# Patient Record
Sex: Female | Born: 1980 | Race: White | Hispanic: No | Smoking: Current every day smoker
Health system: Southern US, Community
[De-identification: ages and names within clinical notes are randomized; demographics above are authoritative.]

## PROBLEM LIST (undated history)

## (undated) DIAGNOSIS — F1990 Other psychoactive substance use, unspecified, uncomplicated: Secondary | ICD-10-CM

## (undated) DIAGNOSIS — J45909 Unspecified asthma, uncomplicated: Secondary | ICD-10-CM

## (undated) DIAGNOSIS — B1920 Unspecified viral hepatitis C without hepatic coma: Secondary | ICD-10-CM

---

## 2016-07-06 DIAGNOSIS — E876 Hypokalemia: Secondary | ICD-10-CM | POA: Diagnosis present

## 2016-07-06 DIAGNOSIS — I2690 Septic pulmonary embolism without acute cor pulmonale: Secondary | ICD-10-CM | POA: Diagnosis present

## 2016-07-06 DIAGNOSIS — B1920 Unspecified viral hepatitis C without hepatic coma: Secondary | ICD-10-CM | POA: Diagnosis present

## 2016-07-06 DIAGNOSIS — J90 Pleural effusion, not elsewhere classified: Secondary | ICD-10-CM | POA: Diagnosis present

## 2016-07-06 DIAGNOSIS — A4102 Sepsis due to Methicillin resistant Staphylococcus aureus: Principal | ICD-10-CM | POA: Diagnosis present

## 2016-07-06 DIAGNOSIS — K083 Retained dental root: Secondary | ICD-10-CM | POA: Diagnosis present

## 2016-07-06 DIAGNOSIS — J45909 Unspecified asthma, uncomplicated: Secondary | ICD-10-CM | POA: Diagnosis present

## 2016-07-06 DIAGNOSIS — M009 Pyogenic arthritis, unspecified: Secondary | ICD-10-CM | POA: Diagnosis present

## 2016-07-06 DIAGNOSIS — D509 Iron deficiency anemia, unspecified: Secondary | ICD-10-CM | POA: Diagnosis not present

## 2016-07-06 DIAGNOSIS — F17210 Nicotine dependence, cigarettes, uncomplicated: Secondary | ICD-10-CM | POA: Diagnosis present

## 2016-07-06 DIAGNOSIS — I330 Acute and subacute infective endocarditis: Secondary | ICD-10-CM | POA: Diagnosis present

## 2016-07-06 DIAGNOSIS — D696 Thrombocytopenia, unspecified: Secondary | ICD-10-CM | POA: Diagnosis present

## 2016-07-06 DIAGNOSIS — J189 Pneumonia, unspecified organism: Secondary | ICD-10-CM | POA: Diagnosis present

## 2016-07-06 DIAGNOSIS — F1110 Opioid abuse, uncomplicated: Secondary | ICD-10-CM | POA: Diagnosis present

## 2016-07-06 DIAGNOSIS — F1010 Alcohol abuse, uncomplicated: Secondary | ICD-10-CM | POA: Diagnosis present

## 2016-07-06 DIAGNOSIS — L89152 Pressure ulcer of sacral region, stage 2: Secondary | ICD-10-CM | POA: Diagnosis not present

## 2016-07-06 DIAGNOSIS — R21 Rash and other nonspecific skin eruption: Secondary | ICD-10-CM | POA: Diagnosis not present

## 2016-07-06 DIAGNOSIS — K029 Dental caries, unspecified: Secondary | ICD-10-CM | POA: Diagnosis present

## 2016-07-06 DIAGNOSIS — K047 Periapical abscess without sinus: Secondary | ICD-10-CM | POA: Diagnosis present

## 2016-07-06 DIAGNOSIS — K045 Chronic apical periodontitis: Secondary | ICD-10-CM | POA: Diagnosis present

## 2016-07-06 DIAGNOSIS — F1410 Cocaine abuse, uncomplicated: Secondary | ICD-10-CM | POA: Diagnosis present

## 2016-07-07 ENCOUNTER — Inpatient Hospital Stay (HOSPITAL_BASED_OUTPATIENT_CLINIC_OR_DEPARTMENT_OTHER): Payer: Self-pay

## 2016-07-07 ENCOUNTER — Emergency Department (HOSPITAL_BASED_OUTPATIENT_CLINIC_OR_DEPARTMENT_OTHER): Payer: Self-pay

## 2016-07-07 ENCOUNTER — Encounter (HOSPITAL_BASED_OUTPATIENT_CLINIC_OR_DEPARTMENT_OTHER): Payer: Self-pay | Admitting: *Deleted

## 2016-07-07 ENCOUNTER — Inpatient Hospital Stay (HOSPITAL_BASED_OUTPATIENT_CLINIC_OR_DEPARTMENT_OTHER)
Admission: EM | Admit: 2016-07-07 | Discharge: 2016-08-08 | DRG: 853 | Disposition: A | Discharge status: Skilled Nursing Facility | Payer: Self-pay | Attending: Nephrology | Admitting: Nephrology

## 2016-07-07 DIAGNOSIS — R768 Other specified abnormal immunological findings in serum: Secondary | ICD-10-CM | POA: Diagnosis present

## 2016-07-07 DIAGNOSIS — K083 Retained dental root: Secondary | ICD-10-CM | POA: Diagnosis present

## 2016-07-07 DIAGNOSIS — J90 Pleural effusion, not elsewhere classified: Secondary | ICD-10-CM

## 2016-07-07 DIAGNOSIS — M25511 Pain in right shoulder: Secondary | ICD-10-CM

## 2016-07-07 DIAGNOSIS — R52 Pain, unspecified: Secondary | ICD-10-CM

## 2016-07-07 DIAGNOSIS — B9562 Methicillin resistant Staphylococcus aureus infection as the cause of diseases classified elsewhere: Secondary | ICD-10-CM | POA: Diagnosis present

## 2016-07-07 DIAGNOSIS — K047 Periapical abscess without sinus: Secondary | ICD-10-CM

## 2016-07-07 DIAGNOSIS — A419 Sepsis, unspecified organism: Secondary | ICD-10-CM | POA: Diagnosis present

## 2016-07-07 DIAGNOSIS — F1010 Alcohol abuse, uncomplicated: Secondary | ICD-10-CM | POA: Diagnosis present

## 2016-07-07 DIAGNOSIS — E876 Hypokalemia: Secondary | ICD-10-CM

## 2016-07-07 DIAGNOSIS — M009 Pyogenic arthritis, unspecified: Secondary | ICD-10-CM

## 2016-07-07 DIAGNOSIS — F1910 Other psychoactive substance abuse, uncomplicated: Secondary | ICD-10-CM

## 2016-07-07 DIAGNOSIS — R6510 Systemic inflammatory response syndrome (SIRS) of non-infectious origin without acute organ dysfunction: Secondary | ICD-10-CM

## 2016-07-07 DIAGNOSIS — R7881 Bacteremia: Secondary | ICD-10-CM

## 2016-07-07 DIAGNOSIS — T17908A Unspecified foreign body in respiratory tract, part unspecified causing other injury, initial encounter: Secondary | ICD-10-CM

## 2016-07-07 DIAGNOSIS — M549 Dorsalgia, unspecified: Secondary | ICD-10-CM

## 2016-07-07 DIAGNOSIS — J984 Other disorders of lung: Secondary | ICD-10-CM

## 2016-07-07 DIAGNOSIS — D696 Thrombocytopenia, unspecified: Secondary | ICD-10-CM | POA: Diagnosis present

## 2016-07-07 DIAGNOSIS — R0602 Shortness of breath: Secondary | ICD-10-CM

## 2016-07-07 DIAGNOSIS — M7551 Bursitis of right shoulder: Secondary | ICD-10-CM

## 2016-07-07 DIAGNOSIS — Z8679 Personal history of other diseases of the circulatory system: Secondary | ICD-10-CM | POA: Diagnosis present

## 2016-07-07 DIAGNOSIS — F1990 Other psychoactive substance use, unspecified, uncomplicated: Secondary | ICD-10-CM | POA: Diagnosis present

## 2016-07-07 DIAGNOSIS — D649 Anemia, unspecified: Secondary | ICD-10-CM | POA: Diagnosis present

## 2016-07-07 DIAGNOSIS — R509 Fever, unspecified: Secondary | ICD-10-CM

## 2016-07-07 DIAGNOSIS — J189 Pneumonia, unspecified organism: Secondary | ICD-10-CM

## 2016-07-07 DIAGNOSIS — F17210 Nicotine dependence, cigarettes, uncomplicated: Secondary | ICD-10-CM | POA: Diagnosis present

## 2016-07-07 DIAGNOSIS — K029 Dental caries, unspecified: Secondary | ICD-10-CM | POA: Diagnosis present

## 2016-07-07 DIAGNOSIS — K045 Chronic apical periodontitis: Secondary | ICD-10-CM | POA: Diagnosis present

## 2016-07-07 DIAGNOSIS — K0530 Chronic periodontitis, unspecified: Secondary | ICD-10-CM | POA: Diagnosis present

## 2016-07-07 DIAGNOSIS — M25519 Pain in unspecified shoulder: Secondary | ICD-10-CM

## 2016-07-07 DIAGNOSIS — L8990 Pressure ulcer of unspecified site, unspecified stage: Secondary | ICD-10-CM | POA: Insufficient documentation

## 2016-07-07 DIAGNOSIS — I2690 Septic pulmonary embolism without acute cor pulmonale: Secondary | ICD-10-CM

## 2016-07-07 DIAGNOSIS — I76 Septic arterial embolism: Secondary | ICD-10-CM

## 2016-07-07 DIAGNOSIS — A4102 Sepsis due to Methicillin resistant Staphylococcus aureus: Secondary | ICD-10-CM

## 2016-07-07 HISTORY — DX: Unspecified viral hepatitis C without hepatic coma: B19.20

## 2016-07-07 HISTORY — DX: Unspecified asthma, uncomplicated: J45.909

## 2016-07-07 HISTORY — DX: Other psychoactive substance use, unspecified, uncomplicated: F19.90

## 2016-07-07 LAB — CBC WITH DIFFERENTIAL/PLATELET
BASOS ABS: 0 10*3/uL (ref 0.0–0.1)
Basophils Relative: 0 %
EOS ABS: 0 10*3/uL (ref 0.0–0.7)
Eosinophils Relative: 0 %
HEMATOCRIT: 36 % (ref 36.0–46.0)
HEMOGLOBIN: 12.1 g/dL (ref 12.0–15.0)
LYMPHS PCT: 6 %
Lymphs Abs: 1.2 10*3/uL (ref 0.7–4.0)
MCH: 27 pg (ref 26.0–34.0)
MCHC: 33.6 g/dL (ref 30.0–36.0)
MCV: 80.4 fL (ref 78.0–100.0)
Monocytes Absolute: 1.6 10*3/uL — ABNORMAL HIGH (ref 0.1–1.0)
Monocytes Relative: 8 %
NEUTROS PCT: 86 %
Neutro Abs: 16.8 10*3/uL — ABNORMAL HIGH (ref 1.7–7.7)
Platelets: 115 10*3/uL — ABNORMAL LOW (ref 150–400)
RBC: 4.48 MIL/uL (ref 3.87–5.11)
RDW: 16.6 % — ABNORMAL HIGH (ref 11.5–15.5)
WBC: 19.6 10*3/uL — ABNORMAL HIGH (ref 4.0–10.5)

## 2016-07-07 LAB — BLOOD CULTURE ID PANEL (REFLEXED)
Acinetobacter baumannii: NOT DETECTED
CANDIDA GLABRATA: NOT DETECTED
CANDIDA KRUSEI: NOT DETECTED
CANDIDA TROPICALIS: NOT DETECTED
Candida albicans: NOT DETECTED
Candida parapsilosis: NOT DETECTED
ENTEROBACTER CLOACAE COMPLEX: NOT DETECTED
ENTEROCOCCUS SPECIES: NOT DETECTED
ESCHERICHIA COLI: NOT DETECTED
Enterobacteriaceae species: NOT DETECTED
Haemophilus influenzae: NOT DETECTED
KLEBSIELLA PNEUMONIAE: NOT DETECTED
Klebsiella oxytoca: NOT DETECTED
LISTERIA MONOCYTOGENES: NOT DETECTED
Methicillin resistance: DETECTED — AB
Neisseria meningitidis: NOT DETECTED
PROTEUS SPECIES: NOT DETECTED
Pseudomonas aeruginosa: NOT DETECTED
SERRATIA MARCESCENS: NOT DETECTED
STAPHYLOCOCCUS SPECIES: DETECTED — AB
STREPTOCOCCUS AGALACTIAE: NOT DETECTED
Staphylococcus aureus (BCID): DETECTED — AB
Streptococcus pneumoniae: NOT DETECTED
Streptococcus pyogenes: NOT DETECTED
Streptococcus species: NOT DETECTED

## 2016-07-07 LAB — PREGNANCY, URINE: Preg Test, Ur: NEGATIVE

## 2016-07-07 LAB — COMPREHENSIVE METABOLIC PANEL
ALBUMIN: 1.6 g/dL — AB (ref 3.5–5.0)
ALK PHOS: 166 U/L — AB (ref 38–126)
ALT: 38 U/L (ref 14–54)
ALT: 23 U/L (ref 14–54)
ANION GAP: 13 (ref 5–15)
AST: 80 U/L — ABNORMAL HIGH (ref 15–41)
AST: 44 U/L — AB (ref 15–41)
Albumin: 2.5 g/dL — ABNORMAL LOW (ref 3.5–5.0)
Alkaline Phosphatase: 110 U/L (ref 38–126)
Anion gap: 5 (ref 5–15)
BILIRUBIN TOTAL: 0.8 mg/dL (ref 0.3–1.2)
BUN: 15 mg/dL (ref 6–20)
BUN: 11 mg/dL (ref 6–20)
CALCIUM: 8.3 mg/dL — AB (ref 8.9–10.3)
CO2: 27 mmol/L (ref 22–32)
CO2: 19 mmol/L — ABNORMAL LOW (ref 22–32)
CREATININE: 0.74 mg/dL (ref 0.44–1.00)
Calcium: 6.9 mg/dL — ABNORMAL LOW (ref 8.9–10.3)
Chloride: 88 mmol/L — ABNORMAL LOW (ref 101–111)
Chloride: 110 mmol/L (ref 101–111)
Creatinine, Ser: 1.1 mg/dL — ABNORMAL HIGH (ref 0.44–1.00)
GFR calc Af Amer: >60 mL/min (ref >60)
GFR calc non Af Amer: >60 mL/min (ref >60)
GLUCOSE: 105 mg/dL — AB (ref 65–99)
Glucose, Bld: 125 mg/dL — ABNORMAL HIGH (ref 65–99)
Potassium: 2.9 mmol/L — ABNORMAL LOW (ref 3.5–5.1)
Potassium: 3.2 mmol/L — ABNORMAL LOW (ref 3.5–5.1)
SODIUM: 128 mmol/L — AB (ref 135–145)
Sodium: 134 mmol/L — ABNORMAL LOW (ref 135–145)
TOTAL PROTEIN: 4.8 g/dL — AB (ref 6.5–8.1)
TOTAL PROTEIN: 7 g/dL (ref 6.5–8.1)
Total Bilirubin: 0.9 mg/dL (ref 0.3–1.2)

## 2016-07-07 LAB — CBC
HEMATOCRIT: 28.1 % — AB (ref 36.0–46.0)
Hemoglobin: 9.2 g/dL — ABNORMAL LOW (ref 12.0–15.0)
MCH: 26.4 pg (ref 26.0–34.0)
MCHC: 32.7 g/dL (ref 30.0–36.0)
MCV: 80.5 fL (ref 78.0–100.0)
Platelets: 111 10*3/uL — ABNORMAL LOW (ref 150–400)
RBC: 3.49 MIL/uL — AB (ref 3.87–5.11)
RDW: 16.8 % — ABNORMAL HIGH (ref 11.5–15.5)
WBC: 13.5 10*3/uL — AB (ref 4.0–10.5)

## 2016-07-07 LAB — URINALYSIS, ROUTINE W REFLEX MICROSCOPIC
GLUCOSE, UA: NEGATIVE mg/dL
Ketones, ur: NEGATIVE mg/dL
Nitrite: NEGATIVE
PH: 6 (ref 5.0–8.0)
PROTEIN: 100 mg/dL — AB
Specific Gravity, Urine: 1.019 (ref 1.005–1.030)

## 2016-07-07 LAB — RAPID URINE DRUG SCREEN, HOSP PERFORMED
AMPHETAMINES: NOT DETECTED
Barbiturates: NOT DETECTED
Benzodiazepines: NOT DETECTED
Cocaine: NOT DETECTED
OPIATES: POSITIVE — AB
Tetrahydrocannabinol: NOT DETECTED

## 2016-07-07 LAB — TROPONIN I
Troponin I: <0.03 ng/mL (ref <0.03)
Troponin I: 0.04 ng/mL (ref <0.03)

## 2016-07-07 LAB — CREATININE, SERUM
Creatinine, Ser: 0.84 mg/dL (ref 0.44–1.00)
GFR calc non Af Amer: >60 mL/min (ref >60)

## 2016-07-07 LAB — URINALYSIS, MICROSCOPIC (REFLEX)

## 2016-07-07 LAB — I-STAT CG4 LACTIC ACID, ED
LACTIC ACID, VENOUS: 3.72 mmol/L — AB (ref 0.5–1.9)
Lactic Acid, Venous: 0.76 mmol/L (ref 0.5–1.9)
Lactic Acid, Venous: 1.42 mmol/L (ref 0.5–1.9)

## 2016-07-07 LAB — LACTIC ACID, PLASMA
Lactic Acid, Venous: 3.3 mmol/L (ref 0.5–1.9)
Lactic Acid, Venous: 2.6 mmol/L (ref 0.5–1.9)

## 2016-07-07 LAB — PHOSPHORUS: Phosphorus: 1.2 mg/dL — ABNORMAL LOW (ref 2.5–4.6)

## 2016-07-07 LAB — RAPID HIV SCREEN (HIV 1/2 AB+AG)
HIV 1/2 Antibodies: NONREACTIVE
HIV-1 P24 Antigen - HIV24: NONREACTIVE

## 2016-07-07 LAB — MRSA PCR SCREENING: MRSA by PCR: POSITIVE — AB

## 2016-07-07 LAB — MAGNESIUM: MAGNESIUM: 1.9 mg/dL (ref 1.7–2.4)

## 2016-07-07 MED ORDER — VITAMIN B-1 100 MG PO TABS
100.0000 mg | ORAL_TABLET | Freq: Every day | ORAL | Status: DC
  Administered 2016-07-07 – 2016-08-08 (×31): 100 mg via ORAL
  Filled 2016-07-07 (×32): qty 1

## 2016-07-07 MED ORDER — IOPAMIDOL (ISOVUE-370) INJECTION 76%
100.0000 mL | Freq: Once | INTRAVENOUS | Status: AC | PRN
  Administered 2016-07-07: 100 mL via INTRAVENOUS

## 2016-07-07 MED ORDER — LORAZEPAM 2 MG/ML IJ SOLN
1.0000 mg | Freq: Four times a day (QID) | INTRAMUSCULAR | Status: AC | PRN
  Administered 2016-07-07 – 2016-07-10 (×6): 1 mg via INTRAVENOUS
  Filled 2016-07-07 (×6): qty 1

## 2016-07-07 MED ORDER — PIPERACILLIN-TAZOBACTAM 3.375 G IVPB 30 MIN
3.3750 g | Freq: Once | INTRAVENOUS | Status: AC
  Administered 2016-07-07: 3.375 g via INTRAVENOUS
  Filled 2016-07-07 (×2): qty 50

## 2016-07-07 MED ORDER — FENTANYL CITRATE (PF) 100 MCG/2ML IJ SOLN
25.0000 ug | Freq: Once | INTRAMUSCULAR | Status: AC
  Administered 2016-07-07: 25 ug via INTRAVENOUS
  Filled 2016-07-07: qty 2

## 2016-07-07 MED ORDER — ALBUTEROL SULFATE (2.5 MG/3ML) 0.083% IN NEBU
2.5000 mg | INHALATION_SOLUTION | RESPIRATORY_TRACT | Status: AC | PRN
  Administered 2016-07-07: 2.5 mg via RESPIRATORY_TRACT
  Filled 2016-07-07: qty 3

## 2016-07-07 MED ORDER — KETOROLAC TROMETHAMINE 15 MG/ML IJ SOLN
15.0000 mg | Freq: Four times a day (QID) | INTRAMUSCULAR | Status: DC | PRN
  Administered 2016-07-08 (×3): 15 mg via INTRAVENOUS
  Filled 2016-07-07 (×5): qty 1

## 2016-07-07 MED ORDER — ONDANSETRON HCL 4 MG/2ML IJ SOLN: 4.0000 mg | Freq: Three times a day (TID) | INTRAMUSCULAR | Status: AC | PRN

## 2016-07-07 MED ORDER — OXYCODONE HCL 5 MG PO TABS
5.0000 mg | ORAL_TABLET | ORAL | Status: DC | PRN
  Administered 2016-07-07 – 2016-07-26 (×54): 5 mg via ORAL
  Filled 2016-07-07 (×61): qty 1

## 2016-07-07 MED ORDER — SODIUM CHLORIDE 0.9 % IV BOLUS (SEPSIS)
500.0000 mL | Freq: Once | INTRAVENOUS | Status: AC
  Administered 2016-07-07: 500 mL via INTRAVENOUS

## 2016-07-07 MED ORDER — ONDANSETRON HCL 4 MG/2ML IJ SOLN
4.0000 mg | Freq: Four times a day (QID) | INTRAMUSCULAR | Status: DC | PRN
  Administered 2016-07-30 – 2016-08-01 (×2): 4 mg via INTRAVENOUS
  Filled 2016-07-07 (×2): qty 2

## 2016-07-07 MED ORDER — HYDRALAZINE HCL 20 MG/ML IJ SOLN: 10.0000 mg | Freq: Three times a day (TID) | INTRAMUSCULAR | Status: DC | PRN

## 2016-07-07 MED ORDER — SODIUM CHLORIDE 0.9 % IV SOLN
INTRAVENOUS | Status: AC
  Administered 2016-07-07: 04:00:00 via INTRAVENOUS

## 2016-07-07 MED ORDER — SODIUM CHLORIDE 0.9 % IV BOLUS (SEPSIS)
1000.0000 mL | Freq: Once | INTRAVENOUS | Status: AC
  Administered 2016-07-07: 1000 mL via INTRAVENOUS

## 2016-07-07 MED ORDER — POTASSIUM CHLORIDE 10 MEQ/100ML IV SOLN
10.0000 meq | INTRAVENOUS | Status: AC
  Administered 2016-07-07 (×4): 10 meq via INTRAVENOUS
  Filled 2016-07-07 (×4): qty 100

## 2016-07-07 MED ORDER — ENSURE ENLIVE PO LIQD
237.0000 mL | Freq: Two times a day (BID) | ORAL | Status: DC
  Administered 2016-07-07 – 2016-07-24 (×23): 237 mL via ORAL

## 2016-07-07 MED ORDER — MUPIROCIN 2 % EX OINT
1.0000 "application " | TOPICAL_OINTMENT | Freq: Two times a day (BID) | CUTANEOUS | Status: AC
  Administered 2016-07-07 – 2016-07-12 (×10): 1 via NASAL
  Filled 2016-07-07 (×3): qty 22

## 2016-07-07 MED ORDER — ACETAMINOPHEN 325 MG PO TABS
650.0000 mg | ORAL_TABLET | Freq: Four times a day (QID) | ORAL | Status: DC | PRN
  Administered 2016-07-07 – 2016-08-07 (×46): 650 mg via ORAL
  Filled 2016-07-07 (×49): qty 2

## 2016-07-07 MED ORDER — PIPERACILLIN-TAZOBACTAM 3.375 G IVPB
3.3750 g | Freq: Three times a day (TID) | INTRAVENOUS | Status: DC
  Filled 2016-07-07 (×2): qty 50

## 2016-07-07 MED ORDER — KETOROLAC TROMETHAMINE 15 MG/ML IJ SOLN
15.0000 mg | Freq: Once | INTRAMUSCULAR | Status: AC
  Administered 2016-07-07: 15 mg via INTRAVENOUS
  Filled 2016-07-07: qty 1

## 2016-07-07 MED ORDER — POLYETHYLENE GLYCOL 3350 17 G PO PACK
17.0000 g | PACK | Freq: Every day | ORAL | Status: DC | PRN
  Administered 2016-07-27 – 2016-07-28 (×2): 17 g via ORAL
  Filled 2016-07-07 (×4): qty 1

## 2016-07-07 MED ORDER — LORAZEPAM 1 MG PO TABS
1.0000 mg | ORAL_TABLET | Freq: Four times a day (QID) | ORAL | Status: AC | PRN
  Administered 2016-07-08 – 2016-07-09 (×2): 1 mg via ORAL
  Filled 2016-07-07 (×2): qty 1

## 2016-07-07 MED ORDER — ENOXAPARIN SODIUM 40 MG/0.4ML ~~LOC~~ SOLN
40.0000 mg | SUBCUTANEOUS | Status: DC
  Administered 2016-07-07 – 2016-07-23 (×18): 40 mg via SUBCUTANEOUS
  Filled 2016-07-07 (×17): qty 0.4

## 2016-07-07 MED ORDER — FOLIC ACID 1 MG PO TABS
1.0000 mg | ORAL_TABLET | Freq: Every day | ORAL | Status: DC
Start: 2016-07-07 — End: 2016-08-08
  Administered 2016-07-07 – 2016-08-08 (×32): 1 mg via ORAL
  Filled 2016-07-07 (×32): qty 1

## 2016-07-07 MED ORDER — IBUPROFEN 400 MG PO TABS
400.0000 mg | ORAL_TABLET | Freq: Once | ORAL | Status: AC
  Administered 2016-07-07: 400 mg via ORAL

## 2016-07-07 MED ORDER — ONDANSETRON HCL 4 MG PO TABS
4.0000 mg | ORAL_TABLET | Freq: Four times a day (QID) | ORAL | Status: DC | PRN
  Administered 2016-08-07 – 2016-08-08 (×2): 4 mg via ORAL
  Filled 2016-07-07 (×2): qty 1

## 2016-07-07 MED ORDER — THIAMINE HCL 100 MG/ML IJ SOLN
100.0000 mg | Freq: Every day | INTRAMUSCULAR | Status: DC
  Administered 2016-07-08: 100 mg via INTRAVENOUS
  Filled 2016-07-07 (×7): qty 2

## 2016-07-07 MED ORDER — VANCOMYCIN HCL IN DEXTROSE 750-5 MG/150ML-% IV SOLN
750.0000 mg | Freq: Three times a day (TID) | INTRAVENOUS | Status: DC
  Administered 2016-07-07 – 2016-07-08 (×3): 750 mg via INTRAVENOUS
  Filled 2016-07-07 (×5): qty 150

## 2016-07-07 MED ORDER — HYDROMORPHONE HCL 1 MG/ML IJ SOLN
1.0000 mg | INTRAMUSCULAR | Status: AC | PRN
  Administered 2016-07-07 – 2016-07-09 (×9): 1 mg via INTRAVENOUS
  Filled 2016-07-07 (×9): qty 1

## 2016-07-07 MED ORDER — IBUPROFEN 400 MG PO TABS
ORAL_TABLET | ORAL | Status: AC
  Filled 2016-07-07: qty 1

## 2016-07-07 MED ORDER — POTASSIUM CHLORIDE CRYS ER 20 MEQ PO TBCR
40.0000 meq | EXTENDED_RELEASE_TABLET | Freq: Once | ORAL | Status: AC
  Administered 2016-07-07: 40 meq via ORAL
  Filled 2016-07-07: qty 2

## 2016-07-07 MED ORDER — TRAZODONE HCL 50 MG PO TABS
50.0000 mg | ORAL_TABLET | Freq: Every evening | ORAL | Status: DC | PRN
  Administered 2016-07-08: 50 mg via ORAL
  Filled 2016-07-07 (×2): qty 1

## 2016-07-07 MED ORDER — CHLORHEXIDINE GLUCONATE CLOTH 2 % EX PADS
6.0000 | MEDICATED_PAD | Freq: Every day | CUTANEOUS | Status: AC
  Administered 2016-07-09 – 2016-07-11 (×2): 6 via TOPICAL

## 2016-07-07 MED ORDER — SODIUM CHLORIDE 0.9 % IV SOLN
INTRAVENOUS | Status: AC
  Administered 2016-07-07 – 2016-07-08 (×3): via INTRAVENOUS

## 2016-07-07 MED ORDER — VANCOMYCIN HCL IN DEXTROSE 1-5 GM/200ML-% IV SOLN
1000.0000 mg | Freq: Once | INTRAVENOUS | Status: AC
  Administered 2016-07-07: 1000 mg via INTRAVENOUS
  Filled 2016-07-07: qty 200

## 2016-07-07 MED ORDER — ADULT MULTIVITAMIN W/MINERALS CH
1.0000 | ORAL_TABLET | Freq: Every day | ORAL | Status: DC
  Administered 2016-07-07 – 2016-08-08 (×32): 1 via ORAL
  Filled 2016-07-07 (×33): qty 1

## 2016-07-07 MED ORDER — ACETAMINOPHEN 650 MG RE SUPP
650.0000 mg | Freq: Four times a day (QID) | RECTAL | Status: DC | PRN
  Filled 2016-07-07: qty 1

## 2016-07-07 MED ORDER — ACETAMINOPHEN 500 MG PO TABS
1000.0000 mg | ORAL_TABLET | Freq: Once | ORAL | Status: AC
  Administered 2016-07-07: 1000 mg via ORAL
  Filled 2016-07-07: qty 2

## 2016-07-07 NOTE — ED Notes (Signed)
Report given to Caleb RN with [facility]. 

## 2016-07-07 NOTE — ED Notes (Signed)
Please call Margarita GrizzleBill Erwin with updates regarding admission [phone]

## 2016-07-07 NOTE — ED Notes (Signed)
Attempt to call report to 4N unable to take report at this time. Left Phone number and name with [employer] operational officersecretary.

## 2016-07-07 NOTE — Consult Note (Signed)
         [facility]    Date of Admission:  07/07/2016    Day 1 vancomycin        Day 1 piperacillin tazobactam Patient Active Problem List   Diagnosis Date Noted  . MRSA bacteremia 07/07/2016    Priority: High  . Cavitary pneumonia 07/07/2016    Priority: High  . Sepsis (HCC) 07/07/2016  . IVDU (intravenous drug user) 07/07/2016  . Cigarette smoker 07/07/2016   Anna Colon is a 36 year old with history of injecting drug use who was just recently released from jail. She developed high fevers leading to admission earlier today. Admission blood cultures are already growing MRSA. 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 I will discontinue the piperacillin tazobactam and treat with vancomycin alone. A transthoracic echocardiogram is already been ordered. I will repeat blood cultures in the morning. I have ordered screening tests for other blood borne pathogens and sexually transmitted diseases. I will follow-up tomorrow and provide a full consultation.         Cliffton AstersJohn Jarmon Javid, MD [facility] [phone] pager   [phone] cell 07/06/2015, 1:32 PM

## 2016-07-07 NOTE — ED Triage Notes (Addendum)
Pt c/o n/v/d x 8 days pt was just released from jail today d/t n/v/d pt denies drug use today, pts pupils are pen point. Pt hard to arouse in triage

## 2016-07-07 NOTE — ED Notes (Signed)
Corn flakes and coffee given per pt request.

## 2016-07-07 NOTE — Progress Notes (Signed)
CRITICAL VALUE ALERT  Critical value received:  Lactate 3.3   Date of notification:  07/07/2016  Time of notification:  1936  Critical value read back: yes  Nurse who received alert: Lelon Frohlichammy Lanore Renderos, RN  MD notified (1st page):  Fanny BienN. Carter, MD  Time of first page:  1945  MD notified (2nd page):  Time of second page:  Responding MD:  Fanny BienN. Carter, MD  Time MD responded:  1950  New orders received for one liter 0.9NS fluid bolus and completion of CIWA protocol

## 2016-07-07 NOTE — Care Management Note (Signed)
Case Management Note  Patient Details  Name: Anna CaterConstance Colon MRN: [id number] Date of Birth: 04-04-1981  Subjective/Objective:                  From home. /36 year old female with history of IV drug abuse who has just been out of the jail yesterday presents with nausea vomiting and fever chills.   Action/Plan: Follow for disposition needs. /Admit to INPATIENT (SIRS, HYPOKALEMIA); anticipate discharge HOME WITH HOME HEALTH.    Expected Discharge Date:                  Expected Discharge Plan:  Home/Self Care  In-House Referral:  NA  Discharge planning Services  CM Consult, Medication Assistance, Indigent Health Clinic  Post Acute Care Choice:  NA Choice offered to:  NA  DME Arranged:  N/A DME Agency:  NA  HH Arranged:  NA HH Agency:  NA  Status of Service:  In process, will continue to follow     Additional Comments: Pt uninsured Anna CohnWood, Anna Ruffino, RN 07/07/2016, 10:03 AM

## 2016-07-07 NOTE — Progress Notes (Signed)
PHARMACY - PHYSICIAN COMMUNICATION CRITICAL VALUE ALERT - BLOOD CULTURE IDENTIFICATION (BCID)  Results for orders placed or performed during the hospital encounter of 07/07/16  Blood Culture ID Panel (Reflexed) (Collected: 07/07/2016 12:40 AM)  Result Value Ref Range   Enterococcus species NOT DETECTED NOT DETECTED   Listeria monocytogenes NOT DETECTED NOT DETECTED   Staphylococcus species DETECTED (A) NOT DETECTED   Staphylococcus aureus DETECTED (A) NOT DETECTED   Methicillin resistance DETECTED (A) NOT DETECTED   Streptococcus species NOT DETECTED NOT DETECTED   Streptococcus agalactiae NOT DETECTED NOT DETECTED   Streptococcus pneumoniae NOT DETECTED NOT DETECTED   Streptococcus pyogenes NOT DETECTED NOT DETECTED   Acinetobacter baumannii NOT DETECTED NOT DETECTED   Enterobacteriaceae species NOT DETECTED NOT DETECTED   Enterobacter cloacae complex NOT DETECTED NOT DETECTED   Escherichia coli NOT DETECTED NOT DETECTED   Klebsiella oxytoca NOT DETECTED NOT DETECTED   Klebsiella pneumoniae NOT DETECTED NOT DETECTED   Proteus species NOT DETECTED NOT DETECTED   Serratia marcescens NOT DETECTED NOT DETECTED   Haemophilus influenzae NOT DETECTED NOT DETECTED   Neisseria meningitidis NOT DETECTED NOT DETECTED   Pseudomonas aeruginosa NOT DETECTED NOT DETECTED   Candida albicans NOT DETECTED NOT DETECTED   Candida glabrata NOT DETECTED NOT DETECTED   Candida krusei NOT DETECTED NOT DETECTED   Candida parapsilosis NOT DETECTED NOT DETECTED   Candida tropicalis NOT DETECTED NOT DETECTED    Name of physician (or Provider) Contacted: None  Changes to prescribed antibiotics required: Currently on vancomycin - appropriate treatment.    Leota SauersLisa Nigel Wessman Pharm.D. CPP, BCPS Clinical Pharmacist [phone] 07/07/2016 5:18 PM

## 2016-07-07 NOTE — Progress Notes (Signed)
Pharmacy Antibiotic Note  Anna CaterConstance Colon is a 36 y.o. female with history of IV drug abuse rleased from jail 07/06/16 who presents with nausea, vomiting, and fever (tmax 104), and chills admitted on 07/07/2016 with sepsis.  Pharmacy has been consulted for vancomycin and Zosyn dosing. WBC 19.6  Plan: Vancomycin 750mg  IV every 8 hours.  Goal trough 15-20 mcg/mL. Zosyn 3.375g IV q8h (4 hour infusion).  Height: 5\' 2"  (157.5 cm) Weight: 142 lb 3.2 oz (64.5 kg) IBW/kg (Calculated) : 50.1  Temp (24hrs), Avg:100.5 F (38.1 C), Min:97.5 F (36.4 C), Max:104.1 F (40.1 C)   Recent Labs Lab 07/07/16 0038 07/07/16 0040 07/07/16 0329 07/07/16 1003 07/07/16 1035  WBC  --  19.6*  --   --   --   CREATININE  --  1.10*  --   --  0.74  LATICACIDVEN 3.72*  --  0.76 1.42  --     Estimated Creatinine Clearance: 85 mL/min (by C-G formula based on SCr of 0.74 mg/dL).    No Known Allergies  Thank you for allowing pharmacy to be a part of this patient's care.  Anna Failony L Anna Colon 07/07/2016 4:05 PM

## 2016-07-07 NOTE — ED Notes (Signed)
Pt requesting something to eat and drink. MD made aware. Pt is NPO and tests come back. PT made aware.

## 2016-07-07 NOTE — Progress Notes (Signed)
Heart rate on 130's,  Sinus tach. Asymptomatic. md made aware no order given. Continue to monitor.

## 2016-07-07 NOTE — ED Provider Notes (Addendum)
[facility] Provider Note: Anna Dell, MD, FACEP  CSN: [id number] MRN: [id number] ARRIVAL: 07/06/16 at 2357 ROOM: MH01/MH01   CHIEF COMPLAINT  Vomiting   HISTORY OF PRESENT ILLNESS  Anna Colon is a 36 y.o. female with history of heroin and cocaine abuse. She has been sick with nausea and vomiting for 2-3 weeks. She has been reticent to seek medical attention because of her substance abuse as well as an outstanding warrant for failure to appear. She has been in jail since December 30 but was released on bond today. She was noted to be febrile (101.6) on arrival with tachycardia (139). She is also complaining of shortness of breath and right upper pleuritic chest pain. She states her lung symptoms are consistent with previous episodes of pneumonia. She has had a decreased appetite but has tried to keep herself hydrated. She rates her chest wall pain as a 10 out of 10.   Past Medical History:  Diagnosis Date  . Asthma   . Hepatitis C     Past Surgical History:  Procedure Laterality Date  . CESAREAN SECTION      No family history on file.  Social History  Substance Use Topics  . Smoking status: Current Every Day Smoker    Packs/day: 1.00  . Smokeless tobacco: Not on file  . Alcohol use No    Prior to Admission medications   Not on File    Allergies Patient has no known allergies.   REVIEW OF SYSTEMS  Negative except as noted here or in the History of Present Illness.   PHYSICAL EXAMINATION  Initial Vital Signs Blood pressure 95/58, pulse (!) 139, temperature 101.6 F (38.7 C), temperature source Oral, resp. rate 18, height 5\' 2"  (1.575 m), weight 110 lb (49.9 kg), last menstrual period 05/16/2016, SpO2 97 %.  Examination General: Well-developed, cachectic female in no acute distress; appearance consistent with age of record HENT: normocephalic; atraumatic Eyes: Pinpoint pupils; extraocular muscles intact Neck: supple Heart: regular rate and  rhythm; tachycardia Lungs: Coarse sounds right upper Abdomen: soft; nondistended; nontender; no masses or hepatosplenomegaly; bowel sounds present Extremities: No deformity; full range of motion; pulses normal Neurologic: Awake, lethargic but arousable, oriented; motor function intact in all extremities and symmetric; no facial droop Skin: Warm and dry Psychiatric: Flat affect   RESULTS  Summary of this visit's results, reviewed by myself:   EKG Interpretation  Date/Time:    Ventricular Rate:    PR Interval:    QRS Duration:   QT Interval:    QTC Calculation:   R Axis:     Text Interpretation:        Laboratory Studies: Results for orders placed or performed during the hospital encounter of 07/07/16 (from the past 24 hour(s))  I-Stat CG4 Lactic Acid, ED  (not at  [facility])     Status: Abnormal   Collection Time: 07/07/16 12:38 AM  Result Value Ref Range   Lactic Acid, Venous 3.72 (HH) 0.5 - 1.9 mmol/L   Comment NOTIFIED PHYSICIAN   Comprehensive metabolic panel     Status: Abnormal   Collection Time: 07/07/16 12:40 AM  Result Value Ref Range   Sodium 128 (L) 135 - 145 mmol/L   Potassium 2.9 (L) 3.5 - 5.1 mmol/L   Chloride 88 (L) 101 - 111 mmol/L   CO2 27 22 - 32 mmol/L   Glucose, Bld 125 (H) 65 - 99 mg/dL   BUN 15 6 - 20 mg/dL   Creatinine, Ser  1.10 (H) 0.44 - 1.00 mg/dL   Calcium 8.3 (L) 8.9 - 10.3 mg/dL   Total Protein 7.0 6.5 - 8.1 g/dL   Albumin 2.5 (L) 3.5 - 5.0 g/dL   AST 80 (H) 15 - 41 U/L   ALT 38 14 - 54 U/L   Alkaline Phosphatase 166 (H) 38 - 126 U/L   Total Bilirubin 0.9 0.3 - 1.2 mg/dL   GFR calc non Af Amer >60 >60 mL/min   GFR calc Af Amer >60 >60 mL/min   Anion gap 13 5 - 15  CBC WITH DIFFERENTIAL     Status: Abnormal   Collection Time: 07/07/16 12:40 AM  Result Value Ref Range   WBC 19.6 (H) 4.0 - 10.5 K/uL   RBC 4.48 3.87 - 5.11 MIL/uL   Hemoglobin 12.1 12.0 - 15.0 g/dL   HCT 09.8 11.9 - 14.7 %   MCV 80.4 78.0 - 100.0 fL   MCH 27.0 26.0 - 34.0  pg   MCHC 33.6 30.0 - 36.0 g/dL   RDW 82.9 (H) 56.2 - 13.0 %   Platelets 115 (L) 150 - 400 K/uL   Neutrophils Relative % 86 %   Lymphocytes Relative 6 %   Monocytes Relative 8 %   Eosinophils Relative 0 %   Basophils Relative 0 %   Neutro Abs 16.8 (H) 1.7 - 7.7 K/uL   Lymphs Abs 1.2 0.7 - 4.0 K/uL   Monocytes Absolute 1.6 (H) 0.1 - 1.0 K/uL   Eosinophils Absolute 0.0 0.0 - 0.7 K/uL   Basophils Absolute 0.0 0.0 - 0.1 K/uL  Blood Culture (routine x 2)     Status: None (Preliminary result)   Collection Time: 07/07/16 12:40 AM  Result Value Ref Range   Specimen Description      BLOOD BLOOD RIGHT FOREARM Performed at [facility]    Special Requests BOTTLES DRAWN AEROBIC AND ANAEROBIC 5CC EACH    Culture PENDING    Report Status PENDING   Rapid HIV screen (HIV 1/2 Ab+Ag)     Status: None   Collection Time: 07/07/16 12:40 AM  Result Value Ref Range   HIV-1 P24 Antigen - HIV24 NON REACTIVE NON REACTIVE   HIV 1/2 Antibodies NON REACTIVE NON REACTIVE   Interpretation (HIV Ag Ab)      A non reactive test result means that HIV 1 or HIV 2 antibodies and HIV 1 p24 antigen were not detected in the specimen.  Blood Culture (routine x 2)     Status: None (Preliminary result)   Collection Time: 07/07/16 12:50 AM  Result Value Ref Range   Specimen Description      BLOOD BLOOD LEFT FOREARM Performed at [facility]    Special Requests IN PEDIATRIC BOTTLE 3CC    Culture PENDING    Report Status PENDING   Urinalysis, Routine w reflex microscopic     Status: Abnormal   Collection Time: 07/07/16  1:10 AM  Result Value Ref Range   Color, Urine AMBER (A) YELLOW   APPearance CLOUDY (A) CLEAR   Specific Gravity, Urine 1.019 1.005 - 1.030   pH 6.0 5.0 - 8.0   Glucose, UA NEGATIVE NEGATIVE mg/dL   Hgb urine dipstick MODERATE (A) NEGATIVE   Bilirubin Urine SMALL (A) NEGATIVE   Ketones, ur NEGATIVE NEGATIVE mg/dL   Protein, ur 865 (A) NEGATIVE mg/dL   Nitrite NEGATIVE NEGATIVE    Leukocytes, UA SMALL (A) NEGATIVE  Rapid urine drug screen (hospital performed)  Status: Abnormal   Collection Time: 07/07/16  1:10 AM  Result Value Ref Range   Opiates POSITIVE (A) NONE DETECTED   Cocaine NONE DETECTED NONE DETECTED   Benzodiazepines NONE DETECTED NONE DETECTED   Amphetamines NONE DETECTED NONE DETECTED   Tetrahydrocannabinol NONE DETECTED NONE DETECTED   Barbiturates NONE DETECTED NONE DETECTED  Urinalysis, Microscopic (reflex)     Status: Abnormal   Collection Time: 07/07/16  1:10 AM  Result Value Ref Range   RBC / HPF 6-30 0 - 5 RBC/hpf   WBC, UA 6-30 0 - 5 WBC/hpf   Bacteria, UA FEW (A) NONE SEEN   Squamous Epithelial / LPF 0-5 (A) NONE SEEN   Non Squamous Epithelial PRESENT (A) NONE SEEN   Hyaline Casts, UA PRESENT   Pregnancy, urine     Status: None   Collection Time: 07/07/16  1:10 AM  Result Value Ref Range   Preg Test, Ur NEGATIVE NEGATIVE  I-Stat CG4 Lactic Acid, ED  ([facility] at  [facility])     Status: None   Collection Time: 07/07/16  3:29 AM  Result Value Ref Range   Lactic Acid, Venous 0.76 0.5 - 1.9 mmol/L   Imaging Studies: Dg Chest 2 View  Result Date: 07/07/2016 CLINICAL DATA:  Nausea vomiting and diarrhea for 8 days EXAM: CHEST  2 VIEW COMPARISON:  None. FINDINGS: Numerous ill-defined nodules throughout both lungs, many cavitary. No confluent consolidation. No large effusion. Hilar, mediastinal and cardiac contours are unremarkable. IMPRESSION: Innumerable nodules, many cavitary. Septic emboli are a leading consideration. Noninfectious inflammatory process or neoplastic process are less likely possibilities. Electronically Signed   By: Ellery Plunkaniel R Mitchell M.D.   On: 07/07/2016 01:55    ED COURSE  Nursing notes and initial vitals signs, including pulse oximetry, reviewed.  Vitals:   07/07/16 0216 07/07/16 0345 07/07/16 0454 07/07/16 0535  BP: 105/57 (!) 97/43 (!) 91/45 100/66  Pulse: 115 93 90   Resp: 22 20 20    Temp: (!) 103.9 F (39.9 C) 102 F  (38.9 C) 98.5 F (36.9 C)   TempSrc: Rectal Rectal Oral   SpO2: 98% 93% 98%   Weight:      Height:       2:13 AM Blood pressure improved after 2 liter normal saline bolus. The third liter of normal saline ordered. We'll start Zosyn and vancomycin for what appears to be multiple pulmonary septic emboli in the setting of known IV drug abuse with recent incarceration.  3:12 AM Dr. Toniann FailKakrakandy [facility]. Potassium repletion ordered.  6:30 AM Patient has been able to drink fluids without emesis. She is now being given her fourth liter of normal saline. Systolic blood pressures have been in the 90s and 100s. [facility]  PROCEDURES   CRITICAL CARE Performed by: Paula LibraMOLPUS,Shona Pardo L Total critical care time: 35 minutes Critical care time was exclusive of separately billable procedures and treating other patients. Critical care was necessary to treat or prevent imminent or life-threatening deterioration. Critical care was time spent personally by me on the following activities: development of treatment plan with patient and/or surrogate as well as nursing, discussions with consultants, evaluation of patient's response to treatment, examination of patient, obtaining history from patient or surrogate, ordering and performing treatments and interventions, ordering and review of laboratory studies, ordering and review of radiographic studies, pulse oximetry and re-evaluation of patient's condition.  ED DIAGNOSES     ICD-9-CM ICD-10-CM   1. SIRS (systemic  inflammatory response syndrome) (HCC) 995.90 R65.10   2. Polysubstance abuse 305.90 F19.10   3. Acute septic pulmonary embolism without acute cor pulmonale (HCC) 415.12 I26.90   4. Hypokalemia 276.8 E87.6        Paula Libra, MD 07/07/16 5621    Paula Libra, MD 07/07/16 [phone]

## 2016-07-07 NOTE — Plan of Care (Signed)
36 year old female with history of IV drug abuse who has just been out of the jail yesterday presents with nausea vomiting and fever chills. Patient had a fever of 104F in the ER. Chest x-ray shows features concerning for septic emboli. ER physician Dr. Read DriversMolpus had discussed with critical care and at this time patient will be admitted to stepdown under hospitalist service. Pregnancy screen is pending. Patient has been started on vancomycin and Zosyn empirically for sepsis source not clear. LFTs are mildly elevated but as per the ER physician abdomen appears benign. Patient is also hypokalemic which for which ER physician will be given IV replacement.  Anna MiniumArshad Jamell Colon.

## 2016-07-07 NOTE — ED Notes (Signed)
Report given to Chat RN on 4NP.

## 2016-07-07 NOTE — Progress Notes (Signed)
CRITICAL VALUE ALERT  Critical value received:  Troponin 0.04  Date of notification:  07/07/2016  Time of notification: 2009  Critical value read back: Yes  Nurse who received alert:  Lelon Frohlichammy Keilynn Marano, RN  MD notified (1st page): Fanny BienN Carter, MD  Time of first page:  2018  MD notified (2nd page):  Time of second page:  Responding MD:  Fanny BienN. Carter, MD  Time MD responded:  2035

## 2016-07-07 NOTE — ED Notes (Signed)
All fluids stopped for IVP dye infusion for CT. BP dropped to 79/49, HR 80 Sats 100%. Pt talking with staff. Started pt back on NS at 999 x 100cc and BP came back up. EDP aware.

## 2016-07-07 NOTE — Progress Notes (Signed)
EKG ordered d/t CP complaints; HR 120-130s - EKG findings reported to Dr. Fanny BienN. Carter @ 2055 per message.  Pt continues to demand Dilaudid.  No new complaints.

## 2016-07-07 NOTE — H&P (Signed)
[facility] History and Physical  Colman CaterConstance Sudbury ZOX:[id number] DOB: 1980/07/07 DOA: 07/07/2016  Referring physician:  PCP: No PCP Per Patient   Chief Complaint: "I do heroin."  HPI: Colman CaterConstance Quinney is a 36 y.o. female  cervical history is for asthma, hep C and intravenous drug use presents emergency room with chief complaint of weakness fatigue cough. Patient states that about 2 weeks ago she began to have cough and weakness. She did not go to the emergency room because she was afraid she will be arrested because she had warrant out for her arrest. The patient was apprehended and taken [facility] and was seen by the physician there and patient states she was started on antibiotics and other medications for feeling ill. Patient is not able to relate her diagnosis or the names of the medications. Patient states that she was let out of jail so she can go to the hospital. Patient states she then immediately presented to the emergency room for evaluation. Patient states the last time she did any illicit drugs was was prior to being arrested and was a combination of crack and heroin. Patient is a daily drinker who has multiple drinks throughout the day and when she does not drink she's has the "shakes". Patient denies any history of withdrawal seizures.  Positive fever chills cough nausea. Denies rash headache blurry vision syncope.   ED course: 2500 mL normal saline given, started on vancomycin Zosyn. Chest x-ray showed possible septic emboli. Confirmed by CT chest. Blood cultures drawn. [facility].  Review of Systems:  As per HPI otherwise 10 point review of systems negative.    Past Medical History:  Diagnosis Date  . Asthma   . Hepatitis C    Past Surgical History:  Procedure Laterality Date  . CESAREAN SECTION     Social History:  reports that she has been smoking.  She has been smoking about 1.00 pack per day. She does  not have any smokeless tobacco history on file. She reports that she uses drugs, including IV and Cocaine. She reports that she does not drink alcohol.  No Known Allergies  No family history on file.   Prior to Admission medications   Medication Sig Start Date End Date Taking? Authorizing Provider  acetaminophen (TYLENOL) 500 MG tablet Take 2,000 mg by mouth every 6 (six) hours as needed for moderate pain or headache.   Yes Historical Provider, MD  ibuprofen (ADVIL,MOTRIN) 200 MG tablet Take 600 mg by mouth every 6 (six) hours as needed for headache or moderate pain.   Yes Historical Provider, MD   Physical Exam: Vitals:   07/07/16 1330 07/07/16 1518 07/07/16 1529 07/07/16 1647  BP: 105/72   124/77  Pulse: 94   (!) 118  Resp: 25   (!) 36  Temp:  97.5 F (36.4 C)  98.1 F (36.7 C)  TempSrc:  Oral  Oral  SpO2: 100%   100%  Weight:   64.5 kg (142 lb 3.2 oz)   Height:   5\' 2"  (1.575 m)     Wt Readings from Last 3 Encounters:  07/07/16 64.5 kg (142 lb 3.2 oz)    General:  Appears calm and comfortable; Alert and oriented 3, appears older than stated age Eyes:  PERRL, EOMI, normal lids, iris ENT:  grossly normal hearing, lips & tongue Neck:  no LAD, masses or thyromegaly Cardiovascular:  RRR, no m/r/g. No LE edema.  Respiratory:  CTA  bilaterally, no rhonchi or rales. Normal respiratory effort. Positive faint wheezes. Abdomen:  soft, ntnd Skin:  no rash or induration seen on limited exam Musculoskeletal:  grossly normal tone BUE/BLE Psychiatric:  grossly normal mood and affect, speech fluent and appropriate Neurologic:  CN 2-12 grossly intact, moves all extremities in coordinated fashion.          Labs on Admission:  Basic Metabolic Panel:  Recent Labs Lab 07/07/16 0040 07/07/16 1035  NA 128* 134*  K 2.9* 3.2*  CL 88* 110  CO2 27 19*  GLUCOSE 125* 105*  BUN 15 11  CREATININE 1.10* 0.74  CALCIUM 8.3* 6.9*   Liver Function Tests:  Recent Labs Lab 07/07/16 0040  07/07/16 1035  AST 80* 44*  ALT 38 23  ALKPHOS 166* 110  BILITOT 0.9 0.8  PROT 7.0 4.8*  ALBUMIN 2.5* 1.6*   No results for input(s): LIPASE, AMYLASE in the last 168 hours. No results for input(s): AMMONIA in the last 168 hours. CBC:  Recent Labs Lab 07/07/16 0040  WBC 19.6*  NEUTROABS 16.8*  HGB 12.1  HCT 36.0  MCV 80.4  PLT 115*   Cardiac Enzymes:  Recent Labs Lab 07/07/16 0945  TROPONINI <0.03    BNP (last 3 results) No results for input(s): BNP in the last 8760 hours.  ProBNP (last 3 results) No results for input(s): PROBNP in the last 8760 hours.   Serum creatinine: 0.74 mg/dL 16/10/96 0454 Estimated creatinine clearance: 85 mL/min  CBG: No results for input(s): GLUCAP in the last 168 hours.  Radiological Exams on Admission: Dg Chest 2 View  Result Date: 07/07/2016 CLINICAL DATA:  Nausea vomiting and diarrhea for 8 days EXAM: CHEST  2 VIEW COMPARISON:  None. FINDINGS: Numerous ill-defined nodules throughout both lungs, many cavitary. No confluent consolidation. No large effusion. Hilar, mediastinal and cardiac contours are unremarkable. IMPRESSION: Innumerable nodules, many cavitary. Septic emboli are a leading consideration. Noninfectious inflammatory process or neoplastic process are less likely possibilities. Electronically Signed   By: Ellery Plunk M.D.   On: 07/07/2016 01:55   Ct Angio Chest Pe W And/or Wo Contrast  Result Date: 07/07/2016 CLINICAL DATA:  Short of breath fever, tachycardia. IV drug abuse. Leukocytosis. EXAM: CT ANGIOGRAPHY CHEST CT ABDOMEN AND PELVIS WITH CONTRAST TECHNIQUE: Multidetector CT imaging of the chest was performed using the standard protocol during bolus administration of intravenous contrast. Multiplanar CT image reconstructions and MIPs were obtained to evaluate the vascular anatomy. Multidetector CT imaging of the abdomen and pelvis was performed using the standard protocol during bolus administration of intravenous  contrast. CONTRAST:  100 mL Isovue COMPARISON:  Chest radiograph 07/07/2016 FINDINGS: CTA CHEST FINDINGS Cardiovascular: No filling defects within the pulmonary to suggest acute pulmonary embolism. No acute findings of the aorta great vessels. No pericardial effusion. Heart is grossly normal. Mediastinum/Nodes: No axillary or supraclavicular adenopathy. No mediastinal hilar adenopathy. Ill-defined peribronchial thickening in the hila. Lungs/Pleura: Bilateral thick-walled cavitary nodules in the upper and lower lobes. Consolidated airspace disease in the lower lobes. Approximately 30 nodules per lung. Example nodule cavitary nodule in the RIGHT upper lobe measures 21 mm (image 27, series 6). Example cavitary nodule in the LEFT upper lobe measures 23 mm on image 30 series 6. Musculoskeletal: No aggressive osseous lesion. Review of the MIP images confirms the above findings. CT ABDOMEN and PELVIS FINDINGS Hepatobiliary: Liver is enlarged. No focal hepatic lesion. Periportal edema noted. Portal veins are patent. Gallbladder wall is edematous and thickened to 6 mm. The gallbladder  lumen is contracted. Common bile duct normal caliber. Pancreas: No pancreatic inflammation or duct dilatation Spleen: Spleen is enlarged measuring 14.7 by 7.5 x 12.4 cm (volume = 720 cm^3). Low-density lesion in the spleen measuring 12 mm does not have simple fluid attenuation. Adrenals/Urinary Tract: Adrenal glands are normal. There is heterogeneous enhancement of the kidneys on the cortical phase imaging (image 32, series 2 for example). On the delayed pyelogram phase imaging the kidneys have a striated appearance (image 14 series 17). No renal obstruction. Bladder normal Stomach/Bowel: Stomach, small bowel, appendix, and cecum are normal. The colon and rectosigmoid colon are normal. Vascular/Lymphatic: Abdominal aorta is normal caliber. No abdominal lymphadenopathy. Reproductive: Uterus and ovaries grossly normal Other: Smaller free fluid in  the posterior pelvis Musculoskeletal: No aggressive osseous lesion. Review of the MIP images confirms the above findings. IMPRESSION: Chest Impression: 1. Bilateral thick-walled cavitary nodules consistent with SEPTIC PULMONARY EMBOLI in this population (young patient with IV drug use). 2. Bibasilar consolidation consistent with pneumonia Abdomen / Pelvis Impression: 1. 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 2. Hepatosplenomegaly. Low-density lesion in the spleen is indeterminate. 3. Small volume of intraperitoneal free fluid and periportal edema may relate to hepatic dysfunction. Electronically Signed   By: Genevive Bi M.D.   On: 07/07/2016 09:29   Ct Abdomen Pelvis W Contrast  Result Date: 07/07/2016 CLINICAL DATA:  Short of breath fever, tachycardia. IV drug abuse. Leukocytosis. EXAM: CT ANGIOGRAPHY CHEST CT ABDOMEN AND PELVIS WITH CONTRAST TECHNIQUE: Multidetector CT imaging of the chest was performed using the standard protocol during bolus administration of intravenous contrast. Multiplanar CT image reconstructions and MIPs were obtained to evaluate the vascular anatomy. Multidetector CT imaging of the abdomen and pelvis was performed using the standard protocol during bolus administration of intravenous contrast. CONTRAST:  100 mL Isovue COMPARISON:  Chest radiograph 07/07/2016 FINDINGS: CTA CHEST FINDINGS Cardiovascular: No filling defects within the pulmonary to suggest acute pulmonary embolism. No acute findings of the aorta great vessels. No pericardial effusion. Heart is grossly normal. Mediastinum/Nodes: No axillary or supraclavicular adenopathy. No mediastinal hilar adenopathy. Ill-defined peribronchial thickening in the hila. Lungs/Pleura: Bilateral thick-walled cavitary nodules in the upper and lower lobes. Consolidated airspace disease in the lower lobes. Approximately 30 nodules per lung. Example nodule cavitary nodule in the RIGHT upper lobe measures  21 mm (image 27, series 6). Example cavitary nodule in the LEFT upper lobe measures 23 mm on image 30 series 6. Musculoskeletal: No aggressive osseous lesion. Review of the MIP images confirms the above findings. CT ABDOMEN and PELVIS FINDINGS Hepatobiliary: Liver is enlarged. No focal hepatic lesion. Periportal edema noted. Portal veins are patent. Gallbladder wall is edematous and thickened to 6 mm. The gallbladder lumen is contracted. Common bile duct normal caliber. Pancreas: No pancreatic inflammation or duct dilatation Spleen: Spleen is enlarged measuring 14.7 by 7.5 x 12.4 cm (volume = 720 cm^3). Low-density lesion in the spleen measuring 12 mm does not have simple fluid attenuation. Adrenals/Urinary Tract: Adrenal glands are normal. There is heterogeneous enhancement of the kidneys on the cortical phase imaging (image 32, series 2 for example). On the delayed pyelogram phase imaging the kidneys have a striated appearance (image 14 series 17). No renal obstruction. Bladder normal Stomach/Bowel: Stomach, small bowel, appendix, and cecum are normal. The colon and rectosigmoid colon are normal. Vascular/Lymphatic: Abdominal aorta is normal caliber. No abdominal lymphadenopathy. Reproductive: Uterus and ovaries grossly normal Other: Smaller free fluid in the posterior pelvis Musculoskeletal: No aggressive osseous lesion. Review  of the MIP images confirms the above findings. IMPRESSION: Chest Impression: 1. Bilateral thick-walled cavitary nodules consistent with SEPTIC PULMONARY EMBOLI in this population (young patient with IV drug use). 2. Bibasilar consolidation consistent with pneumonia Abdomen / Pelvis Impression: 1. 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 2. Hepatosplenomegaly. Low-density lesion in the spleen is indeterminate. 3. Small volume of intraperitoneal free fluid and periportal edema may relate to hepatic dysfunction. Electronically Signed   By: Genevive Bi  M.D.   On: 07/07/2016 09:29    I independently reviewed the chest x-ray and agree with the impression of the radiologist.   EKG: Independently reviewed. VR 130, PR 100, QRS 84, QTC 493, tachycardia - , borderline prolonged QT - no prev   Assessment/Plan Principal Problem:   MRSA bacteremia Active Problems:   Sepsis (HCC)   Cavitary pneumonia   IVDU (intravenous drug user)   Cigarette smoker   ETOH abuse  Sepsis 2/2 MRSA bacteremia Patient hemodynamically stable Given vanc emergency room, will continue Given zosyn in the emergency room, will continue Urine culture pending Blood cultures 2 pending Patient given 2500 mL of fluid in the emergency room Lactic acid normal Lipase normal Toradol and dilaudid prn for pain ECHO Complete ordered ID consulted Record request entered  Hx of ETOH abuse CIWA protocol  Asthma Prn albuterol  Code Status: full  DVT Prophylaxis: lovenox Family Communication: father at bedside Disposition Plan: Pending Improvement  Status: inpt tele  Haydee Salter, MD Family Medicine [facility] [email or web address] Password [email or web address]

## 2016-07-08 ENCOUNTER — Inpatient Hospital Stay (HOSPITAL_COMMUNITY): Payer: Self-pay

## 2016-07-08 DIAGNOSIS — J188 Other pneumonia, unspecified organism: Secondary | ICD-10-CM

## 2016-07-08 DIAGNOSIS — D696 Thrombocytopenia, unspecified: Secondary | ICD-10-CM | POA: Diagnosis present

## 2016-07-08 DIAGNOSIS — R768 Other specified abnormal immunological findings in serum: Secondary | ICD-10-CM

## 2016-07-08 DIAGNOSIS — F1910 Other psychoactive substance abuse, uncomplicated: Secondary | ICD-10-CM

## 2016-07-08 DIAGNOSIS — R7881 Bacteremia: Secondary | ICD-10-CM

## 2016-07-08 DIAGNOSIS — F17210 Nicotine dependence, cigarettes, uncomplicated: Secondary | ICD-10-CM

## 2016-07-08 DIAGNOSIS — I330 Acute and subacute infective endocarditis: Secondary | ICD-10-CM

## 2016-07-08 DIAGNOSIS — D649 Anemia, unspecified: Secondary | ICD-10-CM | POA: Diagnosis present

## 2016-07-08 LAB — CBC
HEMATOCRIT: 23.4 % — AB (ref 36.0–46.0)
HEMOGLOBIN: 7.9 g/dL — AB (ref 12.0–15.0)
MCH: 27.5 pg (ref 26.0–34.0)
MCHC: 33.8 g/dL (ref 30.0–36.0)
MCV: 81.5 fL (ref 78.0–100.0)
Platelets: 103 10*3/uL — ABNORMAL LOW (ref 150–400)
RBC: 2.87 MIL/uL — ABNORMAL LOW (ref 3.87–5.11)
RDW: 16.9 % — ABNORMAL HIGH (ref 11.5–15.5)
WBC: 10.7 10*3/uL — AB (ref 4.0–10.5)

## 2016-07-08 LAB — RESPIRATORY PANEL BY PCR
Adenovirus: NOT DETECTED
Bordetella pertussis: NOT DETECTED
CORONAVIRUS OC43-RVPPCR: NOT DETECTED
Chlamydophila pneumoniae: NOT DETECTED
Coronavirus 229E: NOT DETECTED
Coronavirus HKU1: NOT DETECTED
Coronavirus NL63: NOT DETECTED
INFLUENZA A-RVPPCR: NOT DETECTED
INFLUENZA B-RVPPCR: NOT DETECTED
METAPNEUMOVIRUS-RVPPCR: NOT DETECTED
MYCOPLASMA PNEUMONIAE-RVPPCR: NOT DETECTED
PARAINFLUENZA VIRUS 1-RVPPCR: NOT DETECTED
PARAINFLUENZA VIRUS 3-RVPPCR: NOT DETECTED
PARAINFLUENZA VIRUS 4-RVPPCR: NOT DETECTED
Parainfluenza Virus 2: NOT DETECTED
RESPIRATORY SYNCYTIAL VIRUS-RVPPCR: NOT DETECTED
Rhinovirus / Enterovirus: NOT DETECTED

## 2016-07-08 LAB — ECHOCARDIOGRAM COMPLETE
AV Area mean vel: 1.63 cm2
AV Mean grad: 5 mmHg
AV VEL mean LVOT/AV: 0.64
AV area mean vel ind: 1.02 cm2/m2
AV vel: 1.76
AVA: 1.76 cm2
AVAREAVTI: 1.81 cm2
AVAREAVTIIND: 1.1 cm2/m2
AVPG: 9 mmHg
AVPKVEL: 153 cm/s
Ao pk vel: 0.71 m/s
CHL CUP AV PEAK INDEX: 1.13
DOP CAL AO MEAN VELOCITY: 105 cm/s
EERAT: 5.59
EWDT: 165 ms
FS: 35 % (ref 28–44)
Height: 62 in
IV/PV OW: 1.29
LA ID, A-P, ES: 32 mm
LA diam index: 2 cm/m2
LA vol A4C: 24.7 ml
LA vol index: 20.8 mL/m2
LA vol: 33.2 mL
LDCA: 2.54 cm2
LEFT ATRIUM END SYS DIAM: 32 mm
LV E/e'average: 5.59
LV PW d: 8.81 mm — AB (ref 0.6–1.1)
LV TDI E'LATERAL: 13.1
LV TDI E'MEDIAL: 10.2
LV e' LATERAL: 13.1 cm/s
LVEEMED: 5.59
LVOT SV: 49 mL
LVOT VTI: 19.4 cm
LVOT peak VTI: 0.69 cm
LVOT peak vel: 109 cm/s
LVOTD: 18 mm
MV Dec: 165
MV pk A vel: 72.3 m/s
MVPG: 2 mmHg
MVPKEVEL: 73.2 m/s
RV LATERAL S' VELOCITY: 13.2 cm/s
Reg peak vel: 306 cm/s
TAPSE: 19.6 mm
TR max vel: 306 cm/s
VTI: 28 cm
Valve area index: 1.1
WEIGHTICAEL: 2105.83 [oz_av]

## 2016-07-08 LAB — COMPREHENSIVE METABOLIC PANEL
ALBUMIN: 1.4 g/dL — AB (ref 3.5–5.0)
ALK PHOS: 93 U/L (ref 38–126)
ALT: 19 U/L (ref 14–54)
AST: 31 U/L (ref 15–41)
Anion gap: 6 (ref 5–15)
BUN: 5 mg/dL — ABNORMAL LOW (ref 6–20)
CALCIUM: 6.9 mg/dL — AB (ref 8.9–10.3)
CHLORIDE: 110 mmol/L (ref 101–111)
CO2: 19 mmol/L — AB (ref 22–32)
CREATININE: 0.65 mg/dL (ref 0.44–1.00)
GFR calc Af Amer: >60 mL/min (ref >60)
GFR calc non Af Amer: >60 mL/min (ref >60)
GLUCOSE: 97 mg/dL (ref 65–99)
Potassium: 3.3 mmol/L — ABNORMAL LOW (ref 3.5–5.1)
SODIUM: 135 mmol/L (ref 135–145)
Total Bilirubin: 0.5 mg/dL (ref 0.3–1.2)
Total Protein: 4.4 g/dL — ABNORMAL LOW (ref 6.5–8.1)

## 2016-07-08 LAB — HEPATITIS B SURFACE ANTIGEN: Hepatitis B Surface Ag: NEGATIVE

## 2016-07-08 LAB — URINE CULTURE: Culture: NO GROWTH

## 2016-07-08 LAB — HEPATITIS B SURFACE ANTIBODY, QUANTITATIVE: HEPATITIS B-POST: 26.5 m[IU]/mL

## 2016-07-08 LAB — TROPONIN I: TROPONIN I: 0.08 ng/mL — AB (ref <0.03)

## 2016-07-08 LAB — RPR: RPR Ser Ql: NONREACTIVE

## 2016-07-08 LAB — VANCOMYCIN, TROUGH: Vancomycin Tr: 15 ug/mL (ref 15–20)

## 2016-07-08 LAB — PROTIME-INR
INR: 1.51
Prothrombin Time: 18.4 seconds — ABNORMAL HIGH (ref 11.4–15.2)

## 2016-07-08 LAB — HEPATITIS C ANTIBODY

## 2016-07-08 MED ORDER — IBUPROFEN 200 MG PO TABS
800.0000 mg | ORAL_TABLET | Freq: Once | ORAL | Status: AC
  Administered 2016-07-08: 800 mg via ORAL
  Filled 2016-07-08: qty 4

## 2016-07-08 MED ORDER — VANCOMYCIN HCL IN DEXTROSE 1-5 GM/200ML-% IV SOLN
1000.0000 mg | Freq: Three times a day (TID) | INTRAVENOUS | Status: DC
  Administered 2016-07-08 – 2016-07-17 (×29): 1000 mg via INTRAVENOUS
  Filled 2016-07-08 (×32): qty 200

## 2016-07-08 MED ORDER — POTASSIUM CHLORIDE CRYS ER 20 MEQ PO TBCR
60.0000 meq | EXTENDED_RELEASE_TABLET | Freq: Once | ORAL | Status: AC
  Administered 2016-07-08: 60 meq via ORAL
  Filled 2016-07-08: qty 3

## 2016-07-08 MED ORDER — ACETAMINOPHEN 500 MG PO TABS
500.0000 mg | ORAL_TABLET | Freq: Four times a day (QID) | ORAL | Status: AC
  Administered 2016-07-08 – 2016-07-09 (×4): 500 mg via ORAL
  Filled 2016-07-08 (×4): qty 1

## 2016-07-08 MED ORDER — POTASSIUM CHLORIDE CRYS ER 20 MEQ PO TBCR
40.0000 meq | EXTENDED_RELEASE_TABLET | Freq: Once | ORAL | Status: AC
Start: 2016-07-08 — End: 2016-07-08
  Administered 2016-07-08: 40 meq via ORAL
  Filled 2016-07-08: qty 2

## 2016-07-08 MED ORDER — FUROSEMIDE 10 MG/ML IJ SOLN
20.0000 mg | Freq: Once | INTRAMUSCULAR | Status: AC
  Administered 2016-07-08: 20 mg via INTRAVENOUS
  Filled 2016-07-08: qty 2

## 2016-07-08 NOTE — Consult Note (Signed)
[facility]    Date of Admission:  07/07/2016           Day 2 vancomycin       Reason for Consult: Automatic consultation for MRSA bacteremia     Principal Problem:   MRSA bacteremia Active Problems:   Cavitary pneumonia   Sepsis (HCC)   IVDU (intravenous drug user)   Cigarette smoker   ETOH abuse   Hepatitis C antibody test positive   Normocytic anemia   Thrombocytopenia (HCC)   . acetaminophen  500 mg Oral Q6H  . Chlorhexidine Gluconate Cloth  6 each Topical Q0600  . enoxaparin (LOVENOX) injection  40 mg Subcutaneous Q24H  . feeding supplement (ENSURE ENLIVE)  237 mL Oral BID BM  . folic acid  1 mg Oral Daily  . multivitamin with minerals  1 tablet Oral Daily  . mupirocin ointment  1 application Nasal BID  . thiamine  100 mg Oral Daily   Or  . thiamine  100 mg Intravenous Daily  . vancomycin  1,000 mg Intravenous Q8H    Recommendations: 1. Continue vancomycin 2. Await results of repeat blood cultures and echocardiogram 3. Hold off on PICC placement until we know blood cultures are negative 4. 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 She is hepatitis C antibody positive.    HPI: Storey Stangeland is a 36 y.o. female with a history of injecting drug use who was admitted with fever and found to have MRSA bacteremia and a cavitary pneumonia bilaterally.   Review of Systems: Review of Systems  Unable to perform ROS: Mental acuity    Past Medical History:  Diagnosis Date  . Asthma   . ETOH abuse   . Hepatitis C   . IVDU (intravenous drug user)     Social History  Substance Use Topics  . Smoking status: Current Every Day Smoker    Packs/day: 1.00  . Smokeless tobacco: Not on file  . Alcohol use No    History reviewed. No pertinent family history. No Known Allergies  OBJECTIVE: Blood pressure 111/78, pulse 91,  temperature (!) 96.8 F (36 C), temperature source Axillary, resp. rate (!) 24, height 5\' 2"  (1.575 m), weight 131 lb 9.8 oz (59.7 kg), last menstrual period 05/16/2016, SpO2 98 %.  Physical Exam  Constitutional:  I can only get her to briefly wake-up. All she could say is that she is extremely tired. She fell back asleep and did not answer any other questions.  Eyes: Conjunctivae are normal.  Cardiovascular: Normal rate and regular rhythm.   No murmur heard. Pulmonary/Chest: Effort normal and breath sounds normal.  Abdominal: Soft. There is no tenderness.  Musculoskeletal: Normal range of motion. She exhibits no edema or tenderness.  Skin:  Scattered scars in track marks.    Lab Results Lab Results  Component Value Date   WBC 10.7 (H) 07/08/2016   HGB 7.9 (L) 07/08/2016   HCT 23.4 (L) 07/08/2016   MCV 81.5 07/08/2016   PLT 103 (L) 07/08/2016    Lab Results  Component Value Date   CREATININE 0.65 07/08/2016   BUN 5 (L) 07/08/2016   NA 135 07/08/2016   K 3.3 (L) 07/08/2016   CL 110 07/08/2016   CO2 19 (L) 07/08/2016    Lab Results  Component Value Date   ALT 19 07/08/2016   AST  31 07/08/2016   ALKPHOS 93 07/08/2016   BILITOT 0.5 07/08/2016     Microbiology: Recent Results (from the past 240 hour(s))  Blood Culture (routine x 2)     Status: Abnormal (Preliminary result)   Collection Time: 07/07/16 12:40 AM  Result Value Ref Range Status   Specimen Description BLOOD BLOOD RIGHT FOREARM  Final   Special Requests BOTTLES DRAWN AEROBIC AND ANAEROBIC 5CC EACH  Final   Culture  Setup Time   Final    GRAM POSITIVE COCCI IN CLUSTERS IN BOTH AEROBIC AND ANAEROBIC BOTTLES CRITICAL RESULT CALLED TO, READ BACK BY AND VERIFIED WITH: LHosie Poisson.D. 17:15 07/07/16  ([email or web address])    Culture (A)  Final    STAPHYLOCOCCUS AUREUS SUSCEPTIBILITIES TO FOLLOW Performed at [facility]    Report Status PENDING  Incomplete  Blood Culture ID Panel (Reflexed)     Status: Abnormal     Collection Time: 07/07/16 12:40 AM  Result Value Ref Range Status   Enterococcus species NOT DETECTED NOT DETECTED Final   Listeria monocytogenes NOT DETECTED NOT DETECTED Final   Staphylococcus species DETECTED (A) NOT DETECTED Final    Comment: CRITICAL RESULT CALLED TO, READ BACK BY AND VERIFIED WITH: LHosie Poisson.D. 17:15 07/07/16 (wilsonm)    Staphylococcus aureus DETECTED (A) NOT DETECTED Final    Comment: CRITICAL RESULT CALLED TO, READ BACK BY AND VERIFIED WITH: LHosie Poisson.D. 17:15 07/07/16 (wilsonm)    Methicillin resistance DETECTED (A) NOT DETECTED Final    Comment: CRITICAL RESULT CALLED TO, READ BACK BY AND VERIFIED WITH: LHosie Poisson.D. 17:15 07/07/16 ([email or web address])    Streptococcus species NOT DETECTED NOT DETECTED Final   Streptococcus agalactiae NOT DETECTED NOT DETECTED Final   Streptococcus pneumoniae NOT DETECTED NOT DETECTED Final   Streptococcus pyogenes NOT DETECTED NOT DETECTED Final   Acinetobacter baumannii NOT DETECTED NOT DETECTED Final   Enterobacteriaceae species NOT DETECTED NOT DETECTED Final   Enterobacter cloacae complex NOT DETECTED NOT DETECTED Final   Escherichia coli NOT DETECTED NOT DETECTED Final   Klebsiella oxytoca NOT DETECTED NOT DETECTED Final   Klebsiella pneumoniae NOT DETECTED NOT DETECTED Final   Proteus species NOT DETECTED NOT DETECTED Final   Serratia marcescens NOT DETECTED NOT DETECTED Final   Haemophilus influenzae NOT DETECTED NOT DETECTED Final   Neisseria meningitidis NOT DETECTED NOT DETECTED Final   Pseudomonas aeruginosa NOT DETECTED NOT DETECTED Final   Candida albicans NOT DETECTED NOT DETECTED Final   Candida glabrata NOT DETECTED NOT DETECTED Final   Candida krusei NOT DETECTED NOT DETECTED Final   Candida parapsilosis NOT DETECTED NOT DETECTED Final   Candida tropicalis NOT DETECTED NOT DETECTED Final    Comment: Performed at [facility]  Blood Culture (routine x 2)     Status: Abnormal (Preliminary  result)   Collection Time: 07/07/16 12:50 AM  Result Value Ref Range Status   Specimen Description BLOOD BLOOD LEFT FOREARM  Final   Special Requests IN PEDIATRIC BOTTLE 3CC  Final   Culture  Setup Time   Final    GRAM POSITIVE COCCI IN CLUSTERS AEROBIC BOTTLE ONLY CRITICAL VALUE NOTED.  VALUE IS CONSISTENT WITH PREVIOUSLY REPORTED AND CALLED VALUE. Performed at [facility]    Culture STAPHYLOCOCCUS AUREUS (A)  Final   Report Status PENDING  Incomplete  Urine culture     Status: None   Collection Time: 07/07/16  1:10 AM  Result Value Ref Range Status   Specimen Description URINE, CATHETERIZED  Final  Special Requests NONE  Final   Culture NO GROWTH Performed at [facility]   Final   Report Status 07/08/2016 FINAL  Final  MRSA PCR Screening     Status: Abnormal   Collection Time: 07/07/16  4:12 PM  Result Value Ref Range Status   MRSA by PCR POSITIVE (A) NEGATIVE Final    Comment:        The GeneXpert MRSA Assay (FDA approved for NASAL specimens only), is one component of a comprehensive MRSA colonization surveillance program. It is not intended to diagnose MRSA infection nor to guide or monitor treatment for MRSA infections. RESULT CALLED TO, READ BACK BY AND VERIFIED WITH: Alverda Skeans. Decena RN 17:50 07/07/16 ([email or web address])   Respiratory Panel by PCR     Status: None   Collection Time: 07/08/16  6:52 AM  Result Value Ref Range Status   Adenovirus NOT DETECTED NOT DETECTED Final   Coronavirus 229E NOT DETECTED NOT DETECTED Final   Coronavirus HKU1 NOT DETECTED NOT DETECTED Final   Coronavirus NL63 NOT DETECTED NOT DETECTED Final   Coronavirus OC43 NOT DETECTED NOT DETECTED Final   Metapneumovirus NOT DETECTED NOT DETECTED Final   Rhinovirus / Enterovirus NOT DETECTED NOT DETECTED Final   Influenza A NOT DETECTED NOT DETECTED Final   Influenza B NOT DETECTED NOT DETECTED Final   Parainfluenza Virus 1 NOT DETECTED NOT DETECTED Final   Parainfluenza Virus 2 NOT  DETECTED NOT DETECTED Final   Parainfluenza Virus 3 NOT DETECTED NOT DETECTED Final   Parainfluenza Virus 4 NOT DETECTED NOT DETECTED Final   Respiratory Syncytial Virus NOT DETECTED NOT DETECTED Final   Bordetella pertussis NOT DETECTED NOT DETECTED Final   Chlamydophila pneumoniae NOT DETECTED NOT DETECTED Final   Mycoplasma pneumoniae NOT DETECTED NOT DETECTED Final    Cliffton AstersJohn Aubrei Bouchie, MD [facility] [phone] pager   [phone] cell 07/08/2016, 3:37 PM

## 2016-07-08 NOTE — Progress Notes (Signed)
Pharmacy Antibiotic Note  [name] is a 36 y.o. female with history of IV drug abuse [facility]ased from jail 07/06/16 who presents with nausea, vomiting, and fever (tmax 104), and chills admitted on 07/07/2016 with sepsis.  Pharmacy has been consulted for vancomycin and Zosyn dosing. Antibiotics narrowed to vancomycin yesterday.  Vancomycin trough = 15 today on vancomycin 750 mg IV q 8 hrs.  Level drawn a few minutes early.  Plan: Will increase vancomycin to 1g IV q 8 hrs to target a little higher in goal range. Recheck vancomycin trough at steady state. F/u final cultures.  Height: 5\' 2"  (157.5 cm) Weight: 131 lb 9.8 oz (59.7 kg) IBW/kg (Calculated) : 50.1  Temp (24hrs), Avg:99.9 F (37.7 C), Min:96.8 F (36 C), Max:103.2 F (39.6 C)   Recent Labs Lab 07/07/16 0038 07/07/16 0040 07/07/16 0329 07/07/16 1003 07/07/16 1035 07/07/16 1821 07/07/16 1837 07/07/16 2127 07/08/16 0234 07/08/16 1357  WBC  --  19.6*  --   --   --   --  13.5*  --  10.7*  --   CREATININE  --  1.10*  --   --  0.74  --  0.84  --  0.65  --   LATICACIDVEN 3.72*  --  0.76 1.42  --  3.3*  --  2.6*  --   --   VANCOTROUGH  --   --   --   --   --   --   --   --   --  15    Estimated Creatinine Clearance: 76.2 mL/min (by C-G formula based on SCr of 0.65 mg/dL).    No Known Allergies   Antimicrobials this admission:  Vanc 1/5 >>  Zosyn 1/5 >> 1/5  Dose adjustments this admission:  1/6 Vanc trough = 15 - on vanc 750 mg IV q 8 hrs  Microbiology results:  1/5 BCx: Staph aureus **1/5 BCID - MRSA 1/5 UCx:  1/5 MRSA PCR: (+) 1/6 resp panel pending  Thank you for allowing pharmacy to be a part of this patient's care.  Tad MooreJessica Kellan Raffield, Pharm D, BCPS  Clinical Pharmacist Pager [phone]  07/08/2016 3:03 PM

## 2016-07-08 NOTE — Progress Notes (Signed)
PROGRESS NOTE    Anna Colon  WUJ:[id number] DOB: Dec 04, 1980 DOA: 07/07/2016 PCP: No PCP Per Patient    Brief Narrative:   36 y.o. female  cervical history is for asthma, hep C and intravenous drug use presents emergency room with chief complaint of weakness fatigue cough. Patient states that about 2 weeks ago she began to have cough and weakness.    Assessment & Plan:   Principal Problem:   MRSA bacteremia - Continue vancomycin intravenously  Active Problems:   Sepsis (HCC) - secondary to IVDU most likely. Continue IV antibiotics  Hypokalemia - replace and reassess    Cavitary pneumonia - Most likely due to MRSA bacteremia    IVDU (intravenous drug user) - with improvement in condition with consult social worker.    Cigarette smoker - With improvement in condition will plan on discussing cessation    ETOH abuse - Pt is on CIWA protocol    Hepatitis C antibody test positive   Normocytic anemia   Thrombocytopenia (HCC)   DVT prophylaxis: Lovenox Code Status: Full Family Communication: none at bedside Disposition Plan: continue close monitoring   Consultants:  ID   Procedures: None   Antimicrobials:  Vancomycin   Subjective: Pt has no new complaints. resting  Objective: Vitals:   07/08/16 0757 07/08/16 0800 07/08/16 1000 07/08/16 1100  BP: 103/68 103/69 (!) 94/56 93/65  Pulse: (!) 117 (!) 112 98 78  Resp: (!) 33 (!) 33 (!) 23 (!) 26  Temp: 97.5 F (36.4 C)     TempSrc: Oral     SpO2: 96% 98% 96% 98%  Weight:      Height:        Intake/Output Summary (Last 24 hours) at 07/08/16 1127 Last data filed at 07/08/16 1000  Gross per 24 hour  Intake             2190 ml  Output              950 ml  Net             1240 ml   Filed Weights   07/07/16 0001 07/07/16 1529 07/08/16 0500  Weight: 49.9 kg (110 lb) 64.5 kg (142 lb 3.2 oz) 59.7 kg (131 lb 9.8 oz)    Examination:  General exam: resting but arrousable, in nad. Respiratory system:  equal chest rise, no wheezes Cardiovascular system: S1 & S2 heard, RRR. + murmur Gastrointestinal system: Abdomen is nondistended, soft and nontender.  Central nervous system: Alert and oriented. No focal neurological deficits. Extremities: Symmetric 5 x 5 power, no cyanosis Skin: No rashes, lesions or ulcers Psychiatry: Judgement and insight appear normal. Mood & affect appropriate.    Data Reviewed: I have personally reviewed following labs and imaging studies  CBC:  Recent Labs Lab 07/07/16 0040 07/07/16 1837 07/08/16 0234  WBC 19.6* 13.5* 10.7*  NEUTROABS 16.8*  --   --   HGB 12.1 9.2* 7.9*  HCT 36.0 28.1* 23.4*  MCV 80.4 80.5 81.5  PLT 115* 111* 103*   Basic Metabolic Panel:  Recent Labs Lab 07/07/16 0040 07/07/16 1035 07/07/16 1837 07/08/16 0234  NA 128* 134*  --  135  K 2.9* 3.2*  --  3.3*  CL 88* 110  --  110  CO2 27 19*  --  19*  GLUCOSE 125* 105*  --  97  BUN 15 11  --  5*  CREATININE 1.10* 0.74 0.84 0.65  CALCIUM 8.3* 6.9*  --  6.9*  MG  --   --  1.9  --   PHOS  --   --  1.2*  --    GFR: Estimated Creatinine Clearance: 76.2 mL/min (by C-G formula based on SCr of 0.65 mg/dL). Liver Function Tests:  Recent Labs Lab 07/07/16 0040 07/07/16 1035 07/08/16 0234  AST 80* 44* 31  ALT 38 23 19  ALKPHOS 166* 110 93  BILITOT 0.9 0.8 0.5  PROT 7.0 4.8* 4.4*  ALBUMIN 2.5* 1.6* 1.4*   No results for input(s): LIPASE, AMYLASE in the last 168 hours. No results for input(s): AMMONIA in the last 168 hours. Coagulation Profile:  Recent Labs Lab 07/08/16 0234  INR 1.51   Cardiac Enzymes:  Recent Labs Lab 07/07/16 0945 07/07/16 1837 07/08/16 0234 07/08/16 1002  TROPONINI <0.03 0.04* 0.08* <0.03   BNP (last 3 results) No results for input(s): PROBNP in the last 8760 hours. HbA1C: No results for input(s): HGBA1C in the last 72 hours. CBG: No results for input(s): GLUCAP in the last 168 hours. Lipid Profile: No results for input(s): CHOL, HDL,  LDLCALC, TRIG, CHOLHDL, LDLDIRECT in the last 72 hours. Thyroid Function Tests: No results for input(s): TSH, T4TOTAL, FREET4, T3FREE, THYROIDAB in the last 72 hours. Anemia Panel: No results for input(s): VITAMINB12, FOLATE, FERRITIN, TIBC, IRON, RETICCTPCT in the last 72 hours. Sepsis Labs:  Recent Labs Lab 07/07/16 0329 07/07/16 1003 07/07/16 1821 07/07/16 2127  LATICACIDVEN 0.76 1.42 3.3* 2.6*    Recent Results (from the past 240 hour(s))  Blood Culture (routine x 2)     Status: Abnormal (Preliminary result)   Collection Time: 07/07/16 12:40 AM  Result Value Ref Range Status   Specimen Description BLOOD BLOOD RIGHT FOREARM  Final   Special Requests BOTTLES DRAWN AEROBIC AND ANAEROBIC 5CC EACH  Final   Culture  Setup Time   Final    GRAM POSITIVE COCCI IN CLUSTERS IN BOTH AEROBIC AND ANAEROBIC BOTTLES CRITICAL RESULT CALLED TO, READ BACK BY AND VERIFIED WITH: LHosie Poisson. Curran Pharm.D. 17:15 07/07/16  ([email or web address])    Culture (A)  Final    STAPHYLOCOCCUS AUREUS SUSCEPTIBILITIES TO FOLLOW [facility]    Report Status PENDING  Incomplete  Blood Culture ID Panel (Reflexed)     Status: Abnormal   Collection Time: 07/07/16 12:40 AM  Result Value Ref Range Status   Enterococcus species NOT DETECTED NOT DETECTED Final   Listeria monocytogenes NOT DETECTED NOT DETECTED Final   Staphylococcus species DETECTED (A) NOT DETECTED Final    Comment: CRITICAL RESULT CALLED TO, READ BACK BY AND VERIFIED WITH: LHosie Poisson. Curran Pharm.D. 17:15 07/07/16 ([email or web address])    Staphylococcus aureus DETECTED (A) NOT DETECTED Final    Comment: CRITICAL RESULT CALLED TO, READ BACK BY AND VERIFIED WITH: LHosie Poisson. Curran Pharm.D. 17:15 07/07/16 ([email or web address])    Methicillin resistance DETECTED (A) NOT DETECTED Final    Comment: CRITICAL RESULT CALLED TO, READ BACK BY AND VERIFIED WITH: LHosie Poisson. Curran Pharm.D. 17:15 07/07/16 ([email or web address])    Streptococcus species NOT DETECTED NOT DETECTED Final   Streptococcus agalactiae NOT  DETECTED NOT DETECTED Final   Streptococcus pneumoniae NOT DETECTED NOT DETECTED Final   Streptococcus pyogenes NOT DETECTED NOT DETECTED Final   Acinetobacter baumannii NOT DETECTED NOT DETECTED Final   Enterobacteriaceae species NOT DETECTED NOT DETECTED Final   Enterobacter cloacae complex NOT DETECTED NOT DETECTED Final   Escherichia coli NOT DETECTED NOT DETECTED Final   Klebsiella oxytoca NOT DETECTED NOT DETECTED Final   Klebsiella pneumoniae NOT DETECTED NOT DETECTED Final   Proteus species NOT  DETECTED NOT DETECTED Final   Serratia marcescens NOT DETECTED NOT DETECTED Final   Haemophilus influenzae NOT DETECTED NOT DETECTED Final   Neisseria meningitidis NOT DETECTED NOT DETECTED Final   Pseudomonas aeruginosa NOT DETECTED NOT DETECTED Final   Candida albicans NOT DETECTED NOT DETECTED Final   Candida glabrata NOT DETECTED NOT DETECTED Final   Candida krusei NOT DETECTED NOT DETECTED Final   Candida parapsilosis NOT DETECTED NOT DETECTED Final   Candida tropicalis NOT DETECTED NOT DETECTED Final    Comment: Performed at [facility]  Blood Culture (routine x 2)     Status: Abnormal (Preliminary result)   Collection Time: 07/07/16 12:50 AM  Result Value Ref Range Status   Specimen Description BLOOD BLOOD LEFT FOREARM  Final   Special Requests IN PEDIATRIC BOTTLE 3CC  Final   Culture  Setup Time   Final    GRAM POSITIVE COCCI IN CLUSTERS AEROBIC BOTTLE ONLY CRITICAL VALUE NOTED.  VALUE IS CONSISTENT WITH PREVIOUSLY REPORTED AND CALLED VALUE. Performed at [facility]    Culture STAPHYLOCOCCUS AUREUS (A)  Final   Report Status PENDING  Incomplete  Urine culture     Status: None   Collection Time: 07/07/16  1:10 AM  Result Value Ref Range Status   Specimen Description URINE, CATHETERIZED  Final   Special Requests NONE  Final   Culture NO GROWTH Performed at [facility]   Final   Report Status 07/08/2016 FINAL  Final  MRSA PCR Screening     Status:  Abnormal   Collection Time: 07/07/16  4:12 PM  Result Value Ref Range Status   MRSA by PCR POSITIVE (A) NEGATIVE Final    Comment:        The GeneXpert MRSA Assay (FDA approved for NASAL specimens only), is one component of a comprehensive MRSA colonization surveillance program. It is not intended to diagnose MRSA infection nor to guide or monitor treatment for MRSA infections. RESULT CALLED TO, READ BACK BY AND VERIFIED WITH: Alverda Skeans RN 17:50 07/07/16 ([email or web address])      Radiology Studies: Dg Chest 2 View  Result Date: 07/07/2016 CLINICAL DATA:  Nausea vomiting and diarrhea for 8 days EXAM: CHEST  2 VIEW COMPARISON:  None. FINDINGS: Numerous ill-defined nodules throughout both lungs, many cavitary. No confluent consolidation. No large effusion. Hilar, mediastinal and cardiac contours are unremarkable. IMPRESSION: Innumerable nodules, many cavitary. Septic emboli are a leading consideration. Noninfectious inflammatory process or neoplastic process are less likely possibilities. Electronically Signed   By: Ellery Plunk M.D.   On: 07/07/2016 01:55   Ct Angio Chest Pe W And/or Wo Contrast  Result Date: 07/07/2016 CLINICAL DATA:  Short of breath fever, tachycardia. IV drug abuse. Leukocytosis. EXAM: CT ANGIOGRAPHY CHEST CT ABDOMEN AND PELVIS WITH CONTRAST TECHNIQUE: Multidetector CT imaging of the chest was performed using the standard protocol during bolus administration of intravenous contrast. Multiplanar CT image reconstructions and MIPs were obtained to evaluate the vascular anatomy. Multidetector CT imaging of the abdomen and pelvis was performed using the standard protocol during bolus administration of intravenous contrast. CONTRAST:  100 mL Isovue COMPARISON:  Chest radiograph 07/07/2016 FINDINGS: CTA CHEST FINDINGS Cardiovascular: No filling defects within the pulmonary to suggest acute pulmonary embolism. No acute findings of the aorta great vessels. No pericardial effusion. Heart is  grossly normal. Mediastinum/Nodes: No axillary or supraclavicular adenopathy. No mediastinal hilar adenopathy. Ill-defined peribronchial thickening in the hila. Lungs/Pleura: Bilateral thick-walled cavitary nodules in the upper and lower lobes. Consolidated airspace  disease in the lower lobes. Approximately 30 nodules per lung. Example nodule cavitary nodule in the RIGHT upper lobe measures 21 mm (image 27, series 6). Example cavitary nodule in the LEFT upper lobe measures 23 mm on image 30 series 6. Musculoskeletal: No aggressive osseous lesion. Review of the MIP images confirms the above findings. CT ABDOMEN and PELVIS FINDINGS Hepatobiliary: Liver is enlarged. No focal hepatic lesion. Periportal edema noted. Portal veins are patent. Gallbladder wall is edematous and thickened to 6 mm. The gallbladder lumen is contracted. Common bile duct normal caliber. Pancreas: No pancreatic inflammation or duct dilatation Spleen: Spleen is enlarged measuring 14.7 by 7.5 x 12.4 cm (volume = 720 cm^3). Low-density lesion in the spleen measuring 12 mm does not have simple fluid attenuation. Adrenals/Urinary Tract: Adrenal glands are normal. There is heterogeneous enhancement of the kidneys on the cortical phase imaging (image 32, series 2 for example). On the delayed pyelogram phase imaging the kidneys have a striated appearance (image 14 series 17). No renal obstruction. Bladder normal Stomach/Bowel: Stomach, small bowel, appendix, and cecum are normal. The colon and rectosigmoid colon are normal. Vascular/Lymphatic: Abdominal aorta is normal caliber. No abdominal lymphadenopathy. Reproductive: Uterus and ovaries grossly normal Other: Smaller free fluid in the posterior pelvis Musculoskeletal: No aggressive osseous lesion. Review of the MIP images confirms the above findings. IMPRESSION: Chest Impression: 1. Bilateral thick-walled cavitary nodules consistent with SEPTIC PULMONARY EMBOLI in this population (young patient with IV  drug use). 2. Bibasilar consolidation consistent with pneumonia Abdomen / Pelvis Impression: 1. 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 2. Hepatosplenomegaly. Low-density lesion in the spleen is indeterminate. 3. Small volume of intraperitoneal free fluid and periportal edema may relate to hepatic dysfunction. Electronically Signed   By: Genevive Bi M.D.   On: 07/07/2016 09:29   Ct Abdomen Pelvis W Contrast  Result Date: 07/07/2016 CLINICAL DATA:  Short of breath fever, tachycardia. IV drug abuse. Leukocytosis. EXAM: CT ANGIOGRAPHY CHEST CT ABDOMEN AND PELVIS WITH CONTRAST TECHNIQUE: Multidetector CT imaging of the chest was performed using the standard protocol during bolus administration of intravenous contrast. Multiplanar CT image reconstructions and MIPs were obtained to evaluate the vascular anatomy. Multidetector CT imaging of the abdomen and pelvis was performed using the standard protocol during bolus administration of intravenous contrast. CONTRAST:  100 mL Isovue COMPARISON:  Chest radiograph 07/07/2016 FINDINGS: CTA CHEST FINDINGS Cardiovascular: No filling defects within the pulmonary to suggest acute pulmonary embolism. No acute findings of the aorta great vessels. No pericardial effusion. Heart is grossly normal. Mediastinum/Nodes: No axillary or supraclavicular adenopathy. No mediastinal hilar adenopathy. Ill-defined peribronchial thickening in the hila. Lungs/Pleura: Bilateral thick-walled cavitary nodules in the upper and lower lobes. Consolidated airspace disease in the lower lobes. Approximately 30 nodules per lung. Example nodule cavitary nodule in the RIGHT upper lobe measures 21 mm (image 27, series 6). Example cavitary nodule in the LEFT upper lobe measures 23 mm on image 30 series 6. Musculoskeletal: No aggressive osseous lesion. Review of the MIP images confirms the above findings. CT ABDOMEN and PELVIS FINDINGS Hepatobiliary: Liver is enlarged.  No focal hepatic lesion. Periportal edema noted. Portal veins are patent. Gallbladder wall is edematous and thickened to 6 mm. The gallbladder lumen is contracted. Common bile duct normal caliber. Pancreas: No pancreatic inflammation or duct dilatation Spleen: Spleen is enlarged measuring 14.7 by 7.5 x 12.4 cm (volume = 720 cm^3). Low-density lesion in the spleen measuring 12 mm does not have simple fluid attenuation. Adrenals/Urinary Tract: Adrenal  glands are normal. There is heterogeneous enhancement of the kidneys on the cortical phase imaging (image 32, series 2 for example). On the delayed pyelogram phase imaging the kidneys have a striated appearance (image 14 series 17). No renal obstruction. Bladder normal Stomach/Bowel: Stomach, small bowel, appendix, and cecum are normal. The colon and rectosigmoid colon are normal. Vascular/Lymphatic: Abdominal aorta is normal caliber. No abdominal lymphadenopathy. Reproductive: Uterus and ovaries grossly normal Other: Smaller free fluid in the posterior pelvis Musculoskeletal: No aggressive osseous lesion. Review of the MIP images confirms the above findings. IMPRESSION: Chest Impression: 1. Bilateral thick-walled cavitary nodules consistent with SEPTIC PULMONARY EMBOLI in this population (young patient with IV drug use). 2. Bibasilar consolidation consistent with pneumonia Abdomen / Pelvis Impression: 1. 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 2. Hepatosplenomegaly. Low-density lesion in the spleen is indeterminate. 3. Small volume of intraperitoneal free fluid and periportal edema may relate to hepatic dysfunction. Electronically Signed   By: Genevive Bi M.D.   On: 07/07/2016 09:29    Scheduled Meds: . acetaminophen  500 mg Oral Q6H  . Chlorhexidine Gluconate Cloth  6 each Topical Q0600  . enoxaparin (LOVENOX) injection  40 mg Subcutaneous Q24H  . feeding supplement (ENSURE ENLIVE)  237 mL Oral BID BM  . folic acid  1  mg Oral Daily  . multivitamin with minerals  1 tablet Oral Daily  . mupirocin ointment  1 application Nasal BID  . thiamine  100 mg Oral Daily   Or  . thiamine  100 mg Intravenous Daily  . vancomycin  750 mg Intravenous Q8H   Continuous Infusions:   LOS: 1 day    Time spent: > 35 minutes   Penny Pia, MD [facility] Pager [phone]  If 7PM-7AM, please contact night-coverage [email or web address] Password [email or web address] 07/08/2016, 11:27 AM

## 2016-07-08 NOTE — Progress Notes (Addendum)
07/08/16 0900  What Happened  Was fall witnessed? Yes  Who witnessed fall? Willeen Nieceebecca Myron Lona, RN  Patients activity before fall bathroom-assisted  Point of contact buttocks (assisted to ground via father)  Was patient injured? No  Follow Up  MD notified MD Cena BentonVega  Time MD notified 0900  Family notified Yes-comment (at bedside)  Time family notified 0900  Additional tests No  Simple treatment Other (comment) (none)  Progress note created (see row info) Yes  Adult Fall Risk Assessment  Risk Factor Category (scoring not indicated) High fall risk per protocol (document High fall risk)  Age 36  Fall History: Fall within 6 months prior to admission 5  Elimination; Bowel and/or Urine Incontinence 2  Elimination; Bowel and/or Urine Urgency/Frequency 2  Medications: includes PCA/Opiates, Anti-convulsants, Anti-hypertensives, Diuretics, Hypnotics, Laxatives, Sedatives, and Psychotropics 5  Patient Care Equipment 2  Mobility-Assistance 2  Mobility-Gait 2  Mobility-Sensory Deficit 0  Altered awareness of immediate physical environment 0  Impulsiveness 2  Lack of understanding of one's physical/cognitive limitations 0  Total Score 22  Patient's Fall Risk High Fall Risk (>13 points)  Adult Fall Risk Interventions  Required Bundle Interventions *See Row Information* High fall risk - low, moderate, and high requirements implemented  Additional Interventions Individualized elimination schedule;Family Supervision;Use of appropriate toileting equipment (bedpan, BSC, etc.);Room near nurses station;Reorient/diversional activities with confused patients;Pharmacy review of medications  Screening for Fall Injury Risk  Risk For Fall Injury- See Row Information  Nurse judgement;F  Injury Prevention Interventions Family supervision;Floor Mat  Vitals  Temp 98.2 F (36.8 C)  BP 101/65  MAP (mmHg) 76  BP Location Right Arm  BP Method Automatic  Patient Position (if appropriate) Sitting  Pulse Rate (!) 114   Pulse Rate Source Monitor  Cardiac Rhythm ST  Oxygen Therapy  SpO2 98 %  O2 Device Room Air  Pulse Oximetry Type Continuous  Pain Assessment  Pain Assessment 0-10  Pain Score 8  Pain Type Acute pain  Pain Location Back  Pain Orientation Posterior  Pain Descriptors / Indicators Aching  Pain Frequency Constant  Pain Onset On-going  Pain Intervention(s) Repositioned  Neurological  Neuro (WDL) X  Level of Consciousness Alert  Orientation Level Oriented X4  Cognition Follows commands  Speech Clear  Neuro Symptoms Drowsiness  Neuro symptoms relieved by Rest  Glasgow Coma Scale  Eye Opening 4  Best Verbal Response (NON-intubated) 5  Best Motor Response 6  [place] Coma Scale Score 15  Musculoskeletal  Musculoskeletal (WDL) X  Generalized Weakness Yes  Integumentary  Integumentary (WDL) X  Skin Color Appropriate for ethnicity  Skin Condition Dry;Flaky  Rash Location Arm;Back  Rash Location Orientation Right;Left  Pain Assessment  Work-Related Injury No

## 2016-07-08 NOTE — Progress Notes (Signed)
  Dr. Fanny BienN. Carter at bedside to evaluate patient status.  New orders received for flu swabs; prophylactic droplet precautions; PO Ibuprofen 800mg ; and IV Lasix.  HR 120s; RR 40s; T103.2.  Pt recently medicated w/PO Tylenol 650mg ; IV Dilaudid 1mg ; PO Oxy IR 5mg .  Pt continues to c/o back, shoulder, and chest pain. Pain management strategies discussed w/patient per Dr. Montez Moritaarter.

## 2016-07-08 NOTE — Progress Notes (Addendum)
Morning rounds made with night shift RN at 0740, patient resting in bed with bed alarm on. Education given to father as he arrived about his daughters condition, contact precautions and fall risk. Call bell at patients bedside, and both instructed to call for help if she needs to use the restroom.  At approximately 0820 pt's bed alarm went off, as I was in another patients room, a fellow coworker responded to alarm and assisted patient.  Around approximately 0900 am arrived to room after hearing patients father call for help. Patient in fathers arm as he assisted her to the ground. Patient found to have stool and urine on floor and in bedpan. Pt VSS, and A&Ox4, no sign of injury noted. MD Cena BentonVega notified. Patient cleaned up and placed back in bed with bed alarm on, call bell within reach, privacy curtain pulled back and door open. Will continue to monitor.

## 2016-07-08 NOTE — Progress Notes (Signed)
Initial Nutrition Assessment  INTERVENTION:   Ensure Enlive po BID, each supplement provides 350 kcal and 20 grams of protein  NUTRITION DIAGNOSIS:   Inadequate oral intake related to  (decreased appetite) as evidenced by meal completion < 50%.  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    Pt reports weight loss due to feeling sick and IV drug use. No appetite.  Pt very sleepy and had just been given pain meds.  Breakfast and Lunch in room untouched.   Nutrition-Focused physical exam completed. Findings are no fat depletion, no muscle depletion, and mild edema.   Diet Order:  Diet regular Room service appropriate? Yes; Fluid consistency: Thin  Skin:  Reviewed, no issues  Last BM:  1/6  Height:   Ht Readings from Last 1 Encounters:  07/07/16 5\' 2"  (1.575 m)    Weight:   Wt Readings from Last 1 Encounters:  07/08/16 131 lb 9.8 oz (59.7 kg)    Ideal Body Weight:  50 kg  BMI:  Body mass index is 24.07 kg/m.  Estimated Nutritional Needs:   Kcal:  1600-1800  Protein:  80-90 grams  Fluid:  > 1.6 L/day  EDUCATION NEEDS:   No education needs identified at this time  Kendell BaneHeather Ryaan Vanwagoner RD, LDN, CNSC [phone] Pager [phone] After Hours Pager

## 2016-07-09 ENCOUNTER — Encounter (HOSPITAL_COMMUNITY): Payer: Self-pay | Admitting: *Deleted

## 2016-07-09 DIAGNOSIS — Z8679 Personal history of other diseases of the circulatory system: Secondary | ICD-10-CM | POA: Diagnosis present

## 2016-07-09 LAB — HCV RNA QUANT RFLX ULTRA OR GENOTYP
HCV RNA Qnt(log copy/mL): UNDETERMINED log10 IU/mL
HepC Qn: NOT DETECTED IU/mL

## 2016-07-09 LAB — CULTURE, BLOOD (ROUTINE X 2)

## 2016-07-09 MED ORDER — HYDROMORPHONE HCL 1 MG/ML IJ SOLN
1.0000 mg | INTRAMUSCULAR | Status: AC | PRN
Start: 2016-07-09 — End: 2016-07-11
  Administered 2016-07-09 – 2016-07-11 (×9): 1 mg via INTRAVENOUS
  Filled 2016-07-09 (×9): qty 1

## 2016-07-09 MED ORDER — SODIUM CHLORIDE 0.9 % IV BOLUS (SEPSIS)
500.0000 mL | Freq: Once | INTRAVENOUS | Status: AC
  Administered 2016-07-10: 500 mL via INTRAVENOUS

## 2016-07-09 MED ORDER — SODIUM CHLORIDE 0.9 % IV BOLUS (SEPSIS)
500.0000 mL | Freq: Once | INTRAVENOUS | Status: AC
  Administered 2016-07-09: 500 mL via INTRAVENOUS

## 2016-07-09 NOTE — Progress Notes (Signed)
PROGRESS NOTE    Anna Colon  ZOX:[id number] DOB: 07/06/1980 DOA: 07/07/2016 PCP: No PCP Per Patient    Brief Narrative:   36 y.o. female  cervical history is for asthma, hep C and intravenous drug use presents emergency room with chief complaint of weakness fatigue cough. Patient states that about 2 weeks ago she began to have cough and weakness.    Assessment & Plan:   Principal Problem:   MRSA bacteremia - Continue vancomycin intravenously - + tricuspid vegetation with no regurgitation  Active Problems:   Sepsis (HCC) - secondary to IVDU most likely. Continue IV antibiotics  Hypokalemia - replace and reassess    Cavitary pneumonia - Most likely due to MRSA bacteremia    IVDU (intravenous drug user) - with improvement in condition with consult social worker.    Cigarette smoker - With improvement in condition will plan on discussing cessation    ETOH abuse - Pt is on CIWA protocol    Hepatitis C antibody test positive   Normocytic anemia   Thrombocytopenia (HCC)   DVT prophylaxis: Lovenox Code Status: Full Family Communication: none at bedside Disposition Plan: continue close monitoring   Consultants:  ID   Procedures: None   Antimicrobials:  Vancomycin   Subjective: Pt complaining of back discomfort this am. Was requesting stronger pain medication.  Objective: Vitals:   07/09/16 0752 07/09/16 1000 07/09/16 1140 07/09/16 1200  BP: 122/89 131/90 131/90 131/87  Pulse: 93 (!) 112 (!) 108 (!) 117  Resp: (!) 27 (!) 27 (!) 35 (!) 33  Temp: 99.6 F (37.6 C)  (!) 100.9 F (38.3 C)   TempSrc: Oral  Oral   SpO2: 94% 100% 98% 99%  Weight:      Height:        Intake/Output Summary (Last 24 hours) at 07/09/16 1358 Last data filed at 07/09/16 1144  Gross per 24 hour  Intake              640 ml  Output              650 ml  Net              -10 ml   Filed Weights   07/07/16 1529 07/08/16 0500 07/09/16 0411  Weight: 64.5 kg (142 lb 3.2 oz)  59.7 kg (131 lb 9.8 oz) 61.8 kg (136 lb 3.9 oz)    Examination:  General exam: resting but arrousable, in nad. Respiratory system: equal chest rise, no wheezes Cardiovascular system: S1 & S2 heard, RRR. + murmur Gastrointestinal system: Abdomen is nondistended, soft and nontender.  Central nervous system: Alert and oriented. No focal neurological deficits. Extremities: Symmetric 5 x 5 power, no cyanosis Skin: No rashes, lesions or ulcers Psychiatry: Judgement and insight appear normal. Mood & affect appropriate.    Data Reviewed: I have personally reviewed following labs and imaging studies  CBC:  Recent Labs Lab 07/07/16 0040 07/07/16 1837 07/08/16 0234  WBC 19.6* 13.5* 10.7*  NEUTROABS 16.8*  --   --   HGB 12.1 9.2* 7.9*  HCT 36.0 28.1* 23.4*  MCV 80.4 80.5 81.5  PLT 115* 111* 103*   Basic Metabolic Panel:  Recent Labs Lab 07/07/16 0040 07/07/16 1035 07/07/16 1837 07/08/16 0234  NA 128* 134*  --  135  K 2.9* 3.2*  --  3.3*  CL 88* 110  --  110  CO2 27 19*  --  19*  GLUCOSE 125* 105*  --  97  BUN 15  11  --  5*  CREATININE 1.10* 0.74 0.84 0.65  CALCIUM 8.3* 6.9*  --  6.9*  MG  --   --  1.9  --   PHOS  --   --  1.2*  --    GFR: Estimated Creatinine Clearance: 83.3 mL/min (by C-G formula based on SCr of 0.65 mg/dL). Liver Function Tests:  Recent Labs Lab 07/07/16 0040 07/07/16 1035 07/08/16 0234  AST 80* 44* 31  ALT 38 23 19  ALKPHOS 166* 110 93  BILITOT 0.9 0.8 0.5  PROT 7.0 4.8* 4.4*  ALBUMIN 2.5* 1.6* 1.4*   No results for input(s): LIPASE, AMYLASE in the last 168 hours. No results for input(s): AMMONIA in the last 168 hours. Coagulation Profile:  Recent Labs Lab 07/08/16 0234  INR 1.51   Cardiac Enzymes:  Recent Labs Lab 07/07/16 0945 07/07/16 1837 07/08/16 0234 07/08/16 1002  TROPONINI <0.03 0.04* 0.08* <0.03   BNP (last 3 results) No results for input(s): PROBNP in the last 8760 hours. HbA1C: No results for input(s): HGBA1C  in the last 72 hours. CBG: No results for input(s): GLUCAP in the last 168 hours. Lipid Profile: No results for input(s): CHOL, HDL, LDLCALC, TRIG, CHOLHDL, LDLDIRECT in the last 72 hours. Thyroid Function Tests: No results for input(s): TSH, T4TOTAL, FREET4, T3FREE, THYROIDAB in the last 72 hours. Anemia Panel: No results for input(s): VITAMINB12, FOLATE, FERRITIN, TIBC, IRON, RETICCTPCT in the last 72 hours. Sepsis Labs:  Recent Labs Lab 07/07/16 0329 07/07/16 1003 07/07/16 1821 07/07/16 2127  LATICACIDVEN 0.76 1.42 3.3* 2.6*    Recent Results (from the past 240 hour(s))  Blood Culture (routine x 2)     Status: Abnormal   Collection Time: 07/07/16 12:40 AM  Result Value Ref Range Status   Specimen Description BLOOD BLOOD RIGHT FOREARM  Final   Special Requests BOTTLES DRAWN AEROBIC AND ANAEROBIC 5CC EACH  Final   Culture  Setup Time   Final    GRAM POSITIVE COCCI IN CLUSTERS IN BOTH AEROBIC AND ANAEROBIC BOTTLES CRITICAL RESULT CALLED TO, READ BACK BY AND VERIFIED WITH: LHosie Poisson. Curran Pharm.D. 17:15 07/07/16  ([email or web address]) [facility]    Culture METHICILLIN RESISTANT STAPHYLOCOCCUS AUREUS (A)  Final   Report Status 07/09/2016 FINAL  Final   Organism ID, Bacteria METHICILLIN RESISTANT STAPHYLOCOCCUS AUREUS  Final      Susceptibility   Methicillin resistant staphylococcus aureus - MIC*    CIPROFLOXACIN <=0.5 SENSITIVE Sensitive     ERYTHROMYCIN >=8 RESISTANT Resistant     GENTAMICIN <=0.5 SENSITIVE Sensitive     OXACILLIN >=4 RESISTANT Resistant     TETRACYCLINE <=1 SENSITIVE Sensitive     VANCOMYCIN 1 SENSITIVE Sensitive     TRIMETH/SULFA <=10 SENSITIVE Sensitive     CLINDAMYCIN <=0.25 SENSITIVE Sensitive     RIFAMPIN <=0.5 SENSITIVE Sensitive     Inducible Clindamycin NEGATIVE Sensitive     * METHICILLIN RESISTANT STAPHYLOCOCCUS AUREUS  Blood Culture ID Panel (Reflexed)     Status: Abnormal   Collection Time: 07/07/16 12:40 AM  Result Value Ref Range  Status   Enterococcus species NOT DETECTED NOT DETECTED Final   Listeria monocytogenes NOT DETECTED NOT DETECTED Final   Staphylococcus species DETECTED (A) NOT DETECTED Final    Comment: CRITICAL RESULT CALLED TO, READ BACK BY AND VERIFIED WITH: LHosie Poisson. Curran Pharm.D. 17:15 07/07/16 (wilsonm)    Staphylococcus aureus DETECTED (A) NOT DETECTED Final    Comment: CRITICAL RESULT CALLED TO, READ BACK BY AND VERIFIED  WITH: Lowella Bandy.D. 17:15 07/07/16 (wilsonm)    Methicillin resistance DETECTED (A) NOT DETECTED Final    Comment: CRITICAL RESULT CALLED TO, READ BACK BY AND VERIFIED WITH: LHosie Poisson.D. 17:15 07/07/16 (wilsonm)    Streptococcus species NOT DETECTED NOT DETECTED Final   Streptococcus agalactiae NOT DETECTED NOT DETECTED Final   Streptococcus pneumoniae NOT DETECTED NOT DETECTED Final   Streptococcus pyogenes NOT DETECTED NOT DETECTED Final   Acinetobacter baumannii NOT DETECTED NOT DETECTED Final   Enterobacteriaceae species NOT DETECTED NOT DETECTED Final   Enterobacter cloacae complex NOT DETECTED NOT DETECTED Final   Escherichia coli NOT DETECTED NOT DETECTED Final   Klebsiella oxytoca NOT DETECTED NOT DETECTED Final   Klebsiella pneumoniae NOT DETECTED NOT DETECTED Final   Proteus species NOT DETECTED NOT DETECTED Final   Serratia marcescens NOT DETECTED NOT DETECTED Final   Haemophilus influenzae NOT DETECTED NOT DETECTED Final   Neisseria meningitidis NOT DETECTED NOT DETECTED Final   Pseudomonas aeruginosa NOT DETECTED NOT DETECTED Final   Candida albicans NOT DETECTED NOT DETECTED Final   Candida glabrata NOT DETECTED NOT DETECTED Final   Candida krusei NOT DETECTED NOT DETECTED Final   Candida parapsilosis NOT DETECTED NOT DETECTED Final   Candida tropicalis NOT DETECTED NOT DETECTED Final    Comment: Performed at [facility]  Blood Culture (routine x 2)     Status: Abnormal   Collection Time: 07/07/16 12:50 AM  Result Value Ref Range Status    Specimen Description BLOOD BLOOD LEFT FOREARM  Final   Special Requests IN PEDIATRIC BOTTLE 3CC  Final   Culture  Setup Time   Final    GRAM POSITIVE COCCI IN CLUSTERS AEROBIC BOTTLE ONLY CRITICAL VALUE NOTED.  VALUE IS CONSISTENT WITH PREVIOUSLY REPORTED AND CALLED VALUE.    Culture (A)  Final    STAPHYLOCOCCUS AUREUS SUSCEPTIBILITIES PERFORMED ON PREVIOUS CULTURE WITHIN THE LAST 5 DAYS. Performed at [facility]    Report Status 07/09/2016 FINAL  Final  Urine culture     Status: None   Collection Time: 07/07/16  1:10 AM  Result Value Ref Range Status   Specimen Description URINE, CATHETERIZED  Final   Special Requests NONE  Final   Culture NO GROWTH Performed at [facility]   Final   Report Status 07/08/2016 FINAL  Final  MRSA PCR Screening     Status: Abnormal   Collection Time: 07/07/16  4:12 PM  Result Value Ref Range Status   MRSA by PCR POSITIVE (A) NEGATIVE Final    Comment:        The GeneXpert MRSA Assay (FDA approved for NASAL specimens only), is one component of a comprehensive MRSA colonization surveillance program. It is not intended to diagnose MRSA infection nor to guide or monitor treatment for MRSA infections. RESULT CALLED TO, READ BACK BY AND VERIFIED WITH: Alverda Skeans RN 17:50 07/07/16 ([email or web address])   Respiratory Panel by PCR     Status: None   Collection Time: 07/08/16  6:52 AM  Result Value Ref Range Status   Adenovirus NOT DETECTED NOT DETECTED Final   Coronavirus 229E NOT DETECTED NOT DETECTED Final   Coronavirus HKU1 NOT DETECTED NOT DETECTED Final   Coronavirus NL63 NOT DETECTED NOT DETECTED Final   Coronavirus OC43 NOT DETECTED NOT DETECTED Final   Metapneumovirus NOT DETECTED NOT DETECTED Final   Rhinovirus / Enterovirus NOT DETECTED NOT DETECTED Final   Influenza A NOT DETECTED NOT DETECTED Final   Influenza B NOT  DETECTED NOT DETECTED Final   Parainfluenza Virus 1 NOT DETECTED NOT DETECTED Final   Parainfluenza Virus 2 NOT  DETECTED NOT DETECTED Final   Parainfluenza Virus 3 NOT DETECTED NOT DETECTED Final   Parainfluenza Virus 4 NOT DETECTED NOT DETECTED Final   Respiratory Syncytial Virus NOT DETECTED NOT DETECTED Final   Bordetella pertussis NOT DETECTED NOT DETECTED Final   Chlamydophila pneumoniae NOT DETECTED NOT DETECTED Final   Mycoplasma pneumoniae NOT DETECTED NOT DETECTED Final  Culture, blood (routine x 2)     Status: None (Preliminary result)   Collection Time: 07/08/16 10:00 AM  Result Value Ref Range Status   Specimen Description BLOOD LEFT HAND  Final   Special Requests IN PEDIATRIC BOTTLE 2CC  Final   Culture  Setup Time   Final    IN PEDIATRIC BOTTLE GRAM POSITIVE COCCI IN CLUSTERS CRITICAL RESULT CALLED TO, READ BACK BY AND VERIFIED WITH: J LEDFORD,PHARMD AT 0715 07/09/16 BY L BENFIELD    Culture GRAM POSITIVE COCCI  Final   Report Status PENDING  Incomplete     Radiology Studies: Dg Chest Port 1 View  Result Date: 07/08/2016 CLINICAL DATA:  Patient admitted yesterday with MR SA bacteremia. Possible aspiration today. EXAM: PORTABLE CHEST 1 VIEW COMPARISON:  CT chest and PA and lateral chest 07/07/2014. FINDINGS: Multiple nodular opacities bilaterally again seen. There is increased bibasilar airspace disease. No pneumothorax. Small pleural effusions noted. Heart size is normal. IMPRESSION: Increased bibasilar airspace disease could be due to atelectasis, aspiration and/or pneumonia. Multifocal nodular opacities compatible with septic emboli as seen on chest CT yesterday. Electronically Signed   By: Drusilla Kanner M.D.   On: 07/08/2016 12:07    Scheduled Meds: . Chlorhexidine Gluconate Cloth  6 each Topical Q0600  . enoxaparin (LOVENOX) injection  40 mg Subcutaneous Q24H  . feeding supplement (ENSURE ENLIVE)  237 mL Oral BID BM  . folic acid  1 mg Oral Daily  . multivitamin with minerals  1 tablet Oral Daily  . mupirocin ointment  1 application Nasal BID  . thiamine  100 mg Oral Daily     Or  . thiamine  100 mg Intravenous Daily  . vancomycin  1,000 mg Intravenous Q8H   Continuous Infusions:   LOS: 2 days    Time spent: > 35 minutes   Penny Pia, MD [facility] Pager [phone]  If 7PM-7AM, please contact night-coverage [email or web address] Password [email or web address] 07/09/2016, 1:58 PM

## 2016-07-09 NOTE — Progress Notes (Signed)
Patient febrile. Tylenol given at this time. Milon DikesKristina D Kesler Wickham, RN

## 2016-07-09 NOTE — Progress Notes (Signed)
Patient ID: Anna CaterConstance Bass, female   DOB: 1980-10-03, 36 y.o.   MRN: [id number]          [facility]    Date of Admission:  07/07/2016           Day 3 vancomycin Patient Active Problem List   Diagnosis Date Noted  . 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 MRSA bacteremia 07/07/2016    Priority: High  . Cavitary pneumonia 07/07/2016    Priority: High  . Hepatitis C antibody test positive 07/08/2016  . Normocytic anemia 07/08/2016  . Thrombocytopenia (HCC) 07/08/2016  . Sepsis (HCC) 07/07/2016  . IVDU (intravenous drug user) 07/07/2016  . Cigarette smoker 07/07/2016  . ETOH abuse 07/07/2016   She remains very groggy. Her TTE confirms a large vegetation on the anterior leaflet of her tricuspid valve. There is no significant tricuspid valve regurgitation. One of 2 repeat blood cultures yesterday is positive. I will repeat blood cultures today and continue vancomycin. I did not see a need to proceed with transesophageal echocardiogram.         Cliffton AstersJohn Akeela Busk, MD [facility] [phone] pager   [phone] cell 07/06/2015, 1:32 PM

## 2016-07-09 NOTE — Progress Notes (Signed)
Pt heart rate sinus rhythm in 140's. MD notified. Tylenol and fluid bolus orders received. Will continue to monitor. Pt alert and oriented at this time.

## 2016-07-09 NOTE — Progress Notes (Signed)
PHARMACY - PHYSICIAN COMMUNICATION CRITICAL VALUE ALERT - BLOOD CULTURE IDENTIFICATION (BCID)  1/6: Repeat blood cultures with gram + cocci in clusters, BCID didn't work, but pt with MRSA BCID on 1/5, so likely the same   Results for orders placed or performed during the hospital encounter of 07/07/16  Blood Culture ID Panel (Reflexed) (Collected: 07/07/2016 12:40 AM)  Result Value Ref Range   Enterococcus species NOT DETECTED NOT DETECTED   Listeria monocytogenes NOT DETECTED NOT DETECTED   Staphylococcus species DETECTED (A) NOT DETECTED   Staphylococcus aureus DETECTED (A) NOT DETECTED   Methicillin resistance DETECTED (A) NOT DETECTED   Streptococcus species NOT DETECTED NOT DETECTED   Streptococcus agalactiae NOT DETECTED NOT DETECTED   Streptococcus pneumoniae NOT DETECTED NOT DETECTED   Streptococcus pyogenes NOT DETECTED NOT DETECTED   Acinetobacter baumannii NOT DETECTED NOT DETECTED   Enterobacteriaceae species NOT DETECTED NOT DETECTED   Enterobacter cloacae complex NOT DETECTED NOT DETECTED   Escherichia coli NOT DETECTED NOT DETECTED   Klebsiella oxytoca NOT DETECTED NOT DETECTED   Klebsiella pneumoniae NOT DETECTED NOT DETECTED   Proteus species NOT DETECTED NOT DETECTED   Serratia marcescens NOT DETECTED NOT DETECTED   Haemophilus influenzae NOT DETECTED NOT DETECTED   Neisseria meningitidis NOT DETECTED NOT DETECTED   Pseudomonas aeruginosa NOT DETECTED NOT DETECTED   Candida albicans NOT DETECTED NOT DETECTED   Candida glabrata NOT DETECTED NOT DETECTED   Candida krusei NOT DETECTED NOT DETECTED   Candida parapsilosis NOT DETECTED NOT DETECTED   Candida tropicalis NOT DETECTED NOT DETECTED    Name of physician (or Provider) Contacted: Dr. Cena BentonVega Shari Prows([facility])-via text page  Changes to prescribed antibiotics required: Already on vancomycin, no changes needed  Abran DukeLedford, Maliq Pilley 07/09/2016  7:16 AM

## 2016-07-10 DIAGNOSIS — I2690 Septic pulmonary embolism without acute cor pulmonale: Secondary | ICD-10-CM

## 2016-07-10 DIAGNOSIS — I330 Acute and subacute infective endocarditis: Secondary | ICD-10-CM

## 2016-07-10 DIAGNOSIS — B9562 Methicillin resistant Staphylococcus aureus infection as the cause of diseases classified elsewhere: Secondary | ICD-10-CM

## 2016-07-10 LAB — BASIC METABOLIC PANEL
Anion gap: 5 (ref 5–15)
BUN: 6 mg/dL (ref 6–20)
CALCIUM: 7.5 mg/dL — AB (ref 8.9–10.3)
CO2: 23 mmol/L (ref 22–32)
CREATININE: 0.66 mg/dL (ref 0.44–1.00)
Chloride: 104 mmol/L (ref 101–111)
GFR calc non Af Amer: >60 mL/min (ref >60)
Glucose, Bld: 98 mg/dL (ref 65–99)
Potassium: 4 mmol/L (ref 3.5–5.1)
SODIUM: 132 mmol/L — AB (ref 135–145)

## 2016-07-10 LAB — CBC WITH DIFFERENTIAL/PLATELET
BASOS ABS: 0 10*3/uL (ref 0.0–0.1)
Basophils Relative: 0 %
EOS PCT: 0 %
Eosinophils Absolute: 0 10*3/uL (ref 0.0–0.7)
HCT: 25.8 % — ABNORMAL LOW (ref 36.0–46.0)
HEMOGLOBIN: 8.6 g/dL — AB (ref 12.0–15.0)
LYMPHS ABS: 1.9 10*3/uL (ref 0.7–4.0)
Lymphocytes Relative: 16 %
MCH: 27.2 pg (ref 26.0–34.0)
MCHC: 33.3 g/dL (ref 30.0–36.0)
MCV: 81.6 fL (ref 78.0–100.0)
MONOS PCT: 6 %
Monocytes Absolute: 0.7 10*3/uL (ref 0.1–1.0)
Neutro Abs: 9 10*3/uL — ABNORMAL HIGH (ref 1.7–7.7)
Neutrophils Relative %: 78 %
Platelets: 174 10*3/uL (ref 150–400)
RBC: 3.16 MIL/uL — AB (ref 3.87–5.11)
RDW: 16.8 % — ABNORMAL HIGH (ref 11.5–15.5)
WBC: 11.6 10*3/uL — AB (ref 4.0–10.5)

## 2016-07-10 LAB — VANCOMYCIN, TROUGH: Vancomycin Tr: 18 ug/mL (ref 15–20)

## 2016-07-10 MED ORDER — LORAZEPAM 2 MG/ML IJ SOLN
0.0000 mg | Freq: Four times a day (QID) | INTRAMUSCULAR | Status: AC
Start: 2016-07-10 — End: 2016-07-12
  Administered 2016-07-10 – 2016-07-11 (×3): 2 mg via INTRAVENOUS
  Filled 2016-07-10 (×3): qty 1

## 2016-07-10 MED ORDER — LORAZEPAM 2 MG/ML IJ SOLN
0.0000 mg | Freq: Two times a day (BID) | INTRAMUSCULAR | Status: AC
Start: 2016-07-12 — End: 2016-07-14
  Administered 2016-07-14: 2 mg via INTRAVENOUS
  Filled 2016-07-10 (×2): qty 1

## 2016-07-10 MED ORDER — IBUPROFEN 400 MG PO TABS
400.0000 mg | ORAL_TABLET | Freq: Four times a day (QID) | ORAL | Status: DC | PRN
Start: 2016-07-10 — End: 2016-07-16
  Administered 2016-07-10 – 2016-07-14 (×7): 400 mg via ORAL
  Filled 2016-07-10 (×7): qty 2

## 2016-07-10 MED ORDER — SODIUM CHLORIDE 0.9 % IV BOLUS (SEPSIS)
250.0000 mL | Freq: Once | INTRAVENOUS | Status: AC
  Administered 2016-07-10: 250 mL via INTRAVENOUS

## 2016-07-10 MED ORDER — LORAZEPAM 2 MG/ML IJ SOLN
1.0000 mg | Freq: Four times a day (QID) | INTRAMUSCULAR | Status: AC | PRN
  Administered 2016-07-11 – 2016-07-13 (×2): 1 mg via INTRAVENOUS
  Filled 2016-07-10 (×2): qty 1

## 2016-07-10 MED ORDER — LORAZEPAM 1 MG PO TABS
1.0000 mg | ORAL_TABLET | Freq: Four times a day (QID) | ORAL | Status: AC | PRN
  Filled 2016-07-10: qty 1

## 2016-07-10 NOTE — Progress Notes (Signed)
Patient father states patient's friend "Genevie CheshireBilly" is patient's Higher education careers adviserdrug dealer. Friend was in lobby waiting for patient. Discussion between RN, patient's father, and patient is that patient understands no visitors are allowed in unit without code word. Father states "if Genevie CheshireBilly comes in, then I will no longer be here to support patient." Patient shakes head and verbalizes understanding. Will continue to monitor patient and notify future staff members of this information. Milon DikesKristina D Bricen Victory, RN

## 2016-07-10 NOTE — Progress Notes (Signed)
[facility]  Date of Admission:  07/07/2016           Day 4 vancomycin  Principal Problem:   MRSA bacteremia Active Problems:   Cavitary pneumonia   Endocarditis of tricuspid valve   Sepsis (HCC)   IVDU (intravenous drug user)   Cigarette smoker   ETOH abuse   Hepatitis C antibody test positive   Normocytic anemia   Thrombocytopenia (HCC)   . Chlorhexidine Gluconate Cloth  6 each Topical Q0600  . enoxaparin (LOVENOX) injection  40 mg Subcutaneous Q24H  . feeding supplement (ENSURE ENLIVE)  237 mL Oral BID BM  . folic acid  1 mg Oral Daily  . multivitamin with minerals  1 tablet Oral Daily  . mupirocin ointment  1 application Nasal BID  . thiamine  100 mg Oral Daily   Or  . thiamine  100 mg Intravenous Daily  . vancomycin  1,000 mg Intravenous Q8H    SUBJECTIVE: Complaining of pain all over, especially in her neck, chest and back.  Review of Systems: Review of Systems  Constitutional:       She remains very groggy and has difficulty responding to questions.  Cardiovascular: Positive for chest pain.  Musculoskeletal: Positive for back pain and neck pain.    Past Medical History:  Diagnosis Date  . Asthma   . ETOH abuse   . Hepatitis C   . IVDU (intravenous drug user)     Social History  Substance Use Topics  . Smoking status: Current Every Day Smoker    Packs/day: 1.00  . Smokeless tobacco: Never Used  . Alcohol use No    History reviewed. No pertinent family history. No Known Allergies  OBJECTIVE: Vitals:   07/10/16 1000 07/10/16 1046 07/10/16 1140 07/10/16 1200  BP: 134/86  123/87 112/89  Pulse:   90 88  Resp: (!) 24  (!) 21 (!) 23  Temp:  99.5 F (37.5 C) 98.8 F (37.1 C)   TempSrc:  Oral Oral   SpO2: 96%  93% 91%  Weight:      Height:       Body mass index is 25 kg/m.  Physical Exam  Constitutional:  Very groggy and sedated.  Cardiovascular: Normal rate and regular rhythm.   No murmur  heard. Pulmonary/Chest: Effort normal and breath sounds normal.  Shallow breath sounds. Clear anteriorly.    Lab Results Lab Results  Component Value Date   WBC 11.6 (H) 07/10/2016   HGB 8.6 (L) 07/10/2016   HCT 25.8 (L) 07/10/2016   MCV 81.6 07/10/2016   PLT 174 07/10/2016    Lab Results  Component Value Date   CREATININE 0.66 07/10/2016   BUN 6 07/10/2016   NA 132 (L) 07/10/2016   K 4.0 07/10/2016   CL 104 07/10/2016   CO2 23 07/10/2016    Lab Results  Component Value Date   ALT 19 07/08/2016   AST 31 07/08/2016   ALKPHOS 93 07/08/2016   BILITOT 0.5 07/08/2016     Microbiology: Recent Results (from the past 240 hour(s))  Blood Culture (routine x 2)     Status: Abnormal   Collection Time: 07/07/16 12:40 AM  Result Value Ref Range Status   Specimen Description BLOOD BLOOD RIGHT FOREARM  Final   Special Requests BOTTLES DRAWN AEROBIC AND ANAEROBIC 5CC EACH  Final   Culture  Setup Time   Final    GRAM POSITIVE COCCI IN  CLUSTERS IN BOTH AEROBIC AND ANAEROBIC BOTTLES CRITICAL RESULT CALLED TO, READ BACK BY AND VERIFIED WITH: LHosie Poisson.D. 17:15 07/07/16  (wilsonm) Performed at [facility]    Culture METHICILLIN RESISTANT STAPHYLOCOCCUS AUREUS (A)  Final   Report Status 07/09/2016 FINAL  Final   Organism ID, Bacteria METHICILLIN RESISTANT STAPHYLOCOCCUS AUREUS  Final      Susceptibility   Methicillin resistant staphylococcus aureus - MIC*    CIPROFLOXACIN <=0.5 SENSITIVE Sensitive     ERYTHROMYCIN >=8 RESISTANT Resistant     GENTAMICIN <=0.5 SENSITIVE Sensitive     OXACILLIN >=4 RESISTANT Resistant     TETRACYCLINE <=1 SENSITIVE Sensitive     VANCOMYCIN 1 SENSITIVE Sensitive     TRIMETH/SULFA <=10 SENSITIVE Sensitive     CLINDAMYCIN <=0.25 SENSITIVE Sensitive     RIFAMPIN <=0.5 SENSITIVE Sensitive     Inducible Clindamycin NEGATIVE Sensitive     * METHICILLIN RESISTANT STAPHYLOCOCCUS AUREUS  Blood Culture ID Panel (Reflexed)     Status: Abnormal    Collection Time: 07/07/16 12:40 AM  Result Value Ref Range Status   Enterococcus species NOT DETECTED NOT DETECTED Final   Listeria monocytogenes NOT DETECTED NOT DETECTED Final   Staphylococcus species DETECTED (A) NOT DETECTED Final    Comment: CRITICAL RESULT CALLED TO, READ BACK BY AND VERIFIED WITH: LHosie Poisson.D. 17:15 07/07/16 (wilsonm)    Staphylococcus aureus DETECTED (A) NOT DETECTED Final    Comment: CRITICAL RESULT CALLED TO, READ BACK BY AND VERIFIED WITH: LHosie Poisson.D. 17:15 07/07/16 (wilsonm)    Methicillin resistance DETECTED (A) NOT DETECTED Final    Comment: CRITICAL RESULT CALLED TO, READ BACK BY AND VERIFIED WITH: LHosie Poisson.D. 17:15 07/07/16 (wilsonm)    Streptococcus species NOT DETECTED NOT DETECTED Final   Streptococcus agalactiae NOT DETECTED NOT DETECTED Final   Streptococcus pneumoniae NOT DETECTED NOT DETECTED Final   Streptococcus pyogenes NOT DETECTED NOT DETECTED Final   Acinetobacter baumannii NOT DETECTED NOT DETECTED Final   Enterobacteriaceae species NOT DETECTED NOT DETECTED Final   Enterobacter cloacae complex NOT DETECTED NOT DETECTED Final   Escherichia coli NOT DETECTED NOT DETECTED Final   Klebsiella oxytoca NOT DETECTED NOT DETECTED Final   Klebsiella pneumoniae NOT DETECTED NOT DETECTED Final   Proteus species NOT DETECTED NOT DETECTED Final   Serratia marcescens NOT DETECTED NOT DETECTED Final   Haemophilus influenzae NOT DETECTED NOT DETECTED Final   Neisseria meningitidis NOT DETECTED NOT DETECTED Final   Pseudomonas aeruginosa NOT DETECTED NOT DETECTED Final   Candida albicans NOT DETECTED NOT DETECTED Final   Candida glabrata NOT DETECTED NOT DETECTED Final   Candida krusei NOT DETECTED NOT DETECTED Final   Candida parapsilosis NOT DETECTED NOT DETECTED Final   Candida tropicalis NOT DETECTED NOT DETECTED Final    Comment: Performed at [facility]  Blood Culture (routine x 2)     Status: Abnormal   Collection Time:  07/07/16 12:50 AM  Result Value Ref Range Status   Specimen Description BLOOD BLOOD LEFT FOREARM  Final   Special Requests IN PEDIATRIC BOTTLE 3CC  Final   Culture  Setup Time   Final    GRAM POSITIVE COCCI IN CLUSTERS AEROBIC BOTTLE ONLY CRITICAL VALUE NOTED.  VALUE IS CONSISTENT WITH PREVIOUSLY REPORTED AND CALLED VALUE.    Culture (A)  Final    STAPHYLOCOCCUS AUREUS SUSCEPTIBILITIES PERFORMED ON PREVIOUS CULTURE WITHIN THE LAST 5 DAYS. Performed at [facility]    Report Status 07/09/2016 FINAL  Final  Urine culture     Status: None   Collection Time: 07/07/16  1:10 AM  Result Value Ref Range Status   Specimen Description URINE, CATHETERIZED  Final   Special Requests NONE  Final   Culture NO GROWTH Performed at [facility]   Final   Report Status 07/08/2016 FINAL  Final  MRSA PCR Screening     Status: Abnormal   Collection Time: 07/07/16  4:12 PM  Result Value Ref Range Status   MRSA by PCR POSITIVE (A) NEGATIVE Final    Comment:        The GeneXpert MRSA Assay (FDA approved for NASAL specimens only), is one component of a comprehensive MRSA colonization surveillance program. It is not intended to diagnose MRSA infection nor to guide or monitor treatment for MRSA infections. RESULT CALLED TO, READ BACK BY AND VERIFIED WITH: Alverda Skeans RN 17:50 07/07/16 ([email or web address])   Respiratory Panel by PCR     Status: None   Collection Time: 07/08/16  6:52 AM  Result Value Ref Range Status   Adenovirus NOT DETECTED NOT DETECTED Final   Coronavirus 229E NOT DETECTED NOT DETECTED Final   Coronavirus HKU1 NOT DETECTED NOT DETECTED Final   Coronavirus NL63 NOT DETECTED NOT DETECTED Final   Coronavirus OC43 NOT DETECTED NOT DETECTED Final   Metapneumovirus NOT DETECTED NOT DETECTED Final   Rhinovirus / Enterovirus NOT DETECTED NOT DETECTED Final   Influenza A NOT DETECTED NOT DETECTED Final   Influenza B NOT DETECTED NOT DETECTED Final   Parainfluenza Virus 1 NOT DETECTED  NOT DETECTED Final   Parainfluenza Virus 2 NOT DETECTED NOT DETECTED Final   Parainfluenza Virus 3 NOT DETECTED NOT DETECTED Final   Parainfluenza Virus 4 NOT DETECTED NOT DETECTED Final   Respiratory Syncytial Virus NOT DETECTED NOT DETECTED Final   Bordetella pertussis NOT DETECTED NOT DETECTED Final   Chlamydophila pneumoniae NOT DETECTED NOT DETECTED Final   Mycoplasma pneumoniae NOT DETECTED NOT DETECTED Final  Culture, blood (routine x 2)     Status: None (Preliminary result)   Collection Time: 07/08/16  9:50 AM  Result Value Ref Range Status   Specimen Description BLOOD RIGHT HAND  Final   Special Requests BOTTLES DRAWN AEROBIC ONLY 5CC  Final   Culture NO GROWTH 1 DAY  Final   Report Status PENDING  Incomplete  Culture, blood (routine x 2)     Status: Abnormal (Preliminary result)   Collection Time: 07/08/16 10:00 AM  Result Value Ref Range Status   Specimen Description BLOOD LEFT HAND  Final   Special Requests IN PEDIATRIC BOTTLE 2CC  Final   Culture  Setup Time   Final    IN PEDIATRIC BOTTLE GRAM POSITIVE COCCI IN CLUSTERS CRITICAL RESULT CALLED TO, READ BACK BY AND VERIFIED WITH: J LEDFORD,PHARMD AT 0715 07/09/16 BY L BENFIELD    Culture (A)  Final    STAPHYLOCOCCUS AUREUS SUSCEPTIBILITIES TO FOLLOW    Report Status PENDING  Incomplete  Culture, blood (routine x 2)     Status: None (Preliminary result)   Collection Time: 07/09/16  6:24 PM  Result Value Ref Range Status   Specimen Description BLOOD LEFT FOREARM  Final   Special Requests IN PEDIATRIC BOTTLE 3CC  Final   Culture  Setup Time   Final    GRAM POSITIVE COCCI IN CLUSTERS IN PEDIATRIC BOTTLE CRITICAL RESULT CALLED TO, READ BACK BY AND VERIFIED WITH: Karrie Doffing PHARM [id number] 1120 BEAMJ    Culture PENDING  Incomplete  Report Status PENDING  Incomplete     ASSESSMENT: She remains febrile and repeat blood cultures on 07/08/2016 and 07/09/2016 have turned positive in one set. I will continue IV vancomycin  for MRSA bacteremia, tricuspid valve endocarditis and septic pulmonary emboli and repeat blood cultures again tomorrow morning.  PLAN: 1. Continue vancomycin 2. Repeat blood cultures in a.m.  Cliffton AstersJohn Zriyah Kopplin, MD [facility] [phone] pager   [phone] cell 07/10/2016, 12:50 PM

## 2016-07-10 NOTE — Progress Notes (Signed)
PROGRESS NOTE    Anna Colon  ZOX:[id number] DOB: 1981/02/20 DOA: 07/07/2016 PCP: No PCP Per Patient    Brief Narrative:   36 y.o. female  cervical history is for asthma, hep C and intravenous drug use presents emergency room with chief complaint of weakness fatigue cough. Patient states that about 2 weeks ago she began to have cough and weakness.   Pt complaining of back discomfort but reports that the current pain medication regimen works  Assessment & Plan:   Principal Problem:   MRSA bacteremia - Continue vancomycin intravenously - + tricuspid vegetation with no regurgitation - ID assisting  Active Problems:   Sepsis (HCC) - secondary to IVDU most likely.  - Continue IV antibiotics  Hypokalemia - replace and reassess    Cavitary pneumonia - Most likely due to MRSA bacteremia    IVDU (intravenous drug user) - with improvement in condition with consult social worker.    Cigarette smoker - With improvement in condition will plan on discussing cessation    ETOH abuse - Pt is on CIWA protocol    Hepatitis C antibody test positive   Normocytic anemia   Thrombocytopenia (HCC)   DVT prophylaxis: Lovenox Code Status: Full Family Communication: none at bedside Disposition Plan: continue close monitoring   Consultants:  ID   Procedures: None   Antimicrobials:  Vancomycin   Subjective: Pt has no new complaints  Objective: Vitals:   07/10/16 1046 07/10/16 1140 07/10/16 1200 07/10/16 1400  BP:  123/87 112/89 107/88  Pulse:  90 88 76  Resp:  (!) 21 (!) 23 (!) 21  Temp: 99.5 F (37.5 C) 98.8 F (37.1 C)    TempSrc: Oral Oral    SpO2:  93% 91% 98%  Weight:      Height:        Intake/Output Summary (Last 24 hours) at 07/10/16 1438 Last data filed at 07/10/16 1303  Gross per 24 hour  Intake             2660 ml  Output             1725 ml  Net              935 ml   Filed Weights   07/08/16 0500 07/09/16 0411 07/10/16 0400  Weight: 59.7 kg  (131 lb 9.8 oz) 61.8 kg (136 lb 3.9 oz) 62 kg (136 lb 11 oz)    Examination:  General exam: Awake and alert, in nad. Respiratory system: equal chest rise, no wheezes Cardiovascular system: S1 & S2 heard, RRR. + murmur Gastrointestinal system: Abdomen is nondistended, soft and nontender.  Central nervous system: Alert and oriented. No focal neurological deficits. Extremities: Symmetric 5 x 5 power, no cyanosis Skin: No rashes, lesions or ulcers, on limited exam. Psychiatry: Judgement and insight appear normal. Mood & affect appropriate.    Data Reviewed: I have personally reviewed following labs and imaging studies  CBC:  Recent Labs Lab 07/07/16 0040 07/07/16 1837 07/08/16 0234 07/10/16 0942  WBC 19.6* 13.5* 10.7* 11.6*  NEUTROABS 16.8*  --   --  9.0*  HGB 12.1 9.2* 7.9* 8.6*  HCT 36.0 28.1* 23.4* 25.8*  MCV 80.4 80.5 81.5 81.6  PLT 115* 111* 103* 174   Basic Metabolic Panel:  Recent Labs Lab 07/07/16 0040 07/07/16 1035 07/07/16 1837 07/08/16 0234 07/10/16 0942  NA 128* 134*  --  135 132*  K 2.9* 3.2*  --  3.3* 4.0  CL 88* 110  --  110 104  CO2 27 19*  --  19* 23  GLUCOSE 125* 105*  --  97 98  BUN 15 11  --  5* 6  CREATININE 1.10* 0.74 0.84 0.65 0.66  CALCIUM 8.3* 6.9*  --  6.9* 7.5*  MG  --   --  1.9  --   --   PHOS  --   --  1.2*  --   --    GFR: Estimated Creatinine Clearance: 83.4 mL/min (by C-G formula based on SCr of 0.66 mg/dL). Liver Function Tests:  Recent Labs Lab 07/07/16 0040 07/07/16 1035 07/08/16 0234  AST 80* 44* 31  ALT 38 23 19  ALKPHOS 166* 110 93  BILITOT 0.9 0.8 0.5  PROT 7.0 4.8* 4.4*  ALBUMIN 2.5* 1.6* 1.4*   No results for input(s): LIPASE, AMYLASE in the last 168 hours. No results for input(s): AMMONIA in the last 168 hours. Coagulation Profile:  Recent Labs Lab 07/08/16 0234  INR 1.51   Cardiac Enzymes:  Recent Labs Lab 07/07/16 0945 07/07/16 1837 07/08/16 0234 07/08/16 1002  TROPONINI <0.03 0.04* 0.08*  <0.03   BNP (last 3 results) No results for input(s): PROBNP in the last 8760 hours. HbA1C: No results for input(s): HGBA1C in the last 72 hours. CBG: No results for input(s): GLUCAP in the last 168 hours. Lipid Profile: No results for input(s): CHOL, HDL, LDLCALC, TRIG, CHOLHDL, LDLDIRECT in the last 72 hours. Thyroid Function Tests: No results for input(s): TSH, T4TOTAL, FREET4, T3FREE, THYROIDAB in the last 72 hours. Anemia Panel: No results for input(s): VITAMINB12, FOLATE, FERRITIN, TIBC, IRON, RETICCTPCT in the last 72 hours. Sepsis Labs:  Recent Labs Lab 07/07/16 0329 07/07/16 1003 07/07/16 1821 07/07/16 2127  LATICACIDVEN 0.76 1.42 3.3* 2.6*    Recent Results (from the past 240 hour(s))  Blood Culture (routine x 2)     Status: Abnormal   Collection Time: 07/07/16 12:40 AM  Result Value Ref Range Status   Specimen Description BLOOD BLOOD RIGHT FOREARM  Final   Special Requests BOTTLES DRAWN AEROBIC AND ANAEROBIC 5CC EACH  Final   Culture  Setup Time   Final    GRAM POSITIVE COCCI IN CLUSTERS IN BOTH AEROBIC AND ANAEROBIC BOTTLES CRITICAL RESULT CALLED TO, READ BACK BY AND VERIFIED WITH: LHosie Poisson.D. 17:15 07/07/16  (wilsonm) Performed at [facility]    Culture METHICILLIN RESISTANT STAPHYLOCOCCUS AUREUS (A)  Final   Report Status 07/09/2016 FINAL  Final   Organism ID, Bacteria METHICILLIN RESISTANT STAPHYLOCOCCUS AUREUS  Final      Susceptibility   Methicillin resistant staphylococcus aureus - MIC*    CIPROFLOXACIN <=0.5 SENSITIVE Sensitive     ERYTHROMYCIN >=8 RESISTANT Resistant     GENTAMICIN <=0.5 SENSITIVE Sensitive     OXACILLIN >=4 RESISTANT Resistant     TETRACYCLINE <=1 SENSITIVE Sensitive     VANCOMYCIN 1 SENSITIVE Sensitive     TRIMETH/SULFA <=10 SENSITIVE Sensitive     CLINDAMYCIN <=0.25 SENSITIVE Sensitive     RIFAMPIN <=0.5 SENSITIVE Sensitive     Inducible Clindamycin NEGATIVE Sensitive     * METHICILLIN RESISTANT STAPHYLOCOCCUS  AUREUS  Blood Culture ID Panel (Reflexed)     Status: Abnormal   Collection Time: 07/07/16 12:40 AM  Result Value Ref Range Status   Enterococcus species NOT DETECTED NOT DETECTED Final   Listeria monocytogenes NOT DETECTED NOT DETECTED Final   Staphylococcus species DETECTED (A) NOT DETECTED Final    Comment: CRITICAL RESULT CALLED TO, READ BACK BY  AND VERIFIED WITH: Lowella Bandy.D. 17:15 07/07/16 (wilsonm)    Staphylococcus aureus DETECTED (A) NOT DETECTED Final    Comment: CRITICAL RESULT CALLED TO, READ BACK BY AND VERIFIED WITH: LHosie Poisson.D. 17:15 07/07/16 (wilsonm)    Methicillin resistance DETECTED (A) NOT DETECTED Final    Comment: CRITICAL RESULT CALLED TO, READ BACK BY AND VERIFIED WITH: LHosie Poisson.D. 17:15 07/07/16 (wilsonm)    Streptococcus species NOT DETECTED NOT DETECTED Final   Streptococcus agalactiae NOT DETECTED NOT DETECTED Final   Streptococcus pneumoniae NOT DETECTED NOT DETECTED Final   Streptococcus pyogenes NOT DETECTED NOT DETECTED Final   Acinetobacter baumannii NOT DETECTED NOT DETECTED Final   Enterobacteriaceae species NOT DETECTED NOT DETECTED Final   Enterobacter cloacae complex NOT DETECTED NOT DETECTED Final   Escherichia coli NOT DETECTED NOT DETECTED Final   Klebsiella oxytoca NOT DETECTED NOT DETECTED Final   Klebsiella pneumoniae NOT DETECTED NOT DETECTED Final   Proteus species NOT DETECTED NOT DETECTED Final   Serratia marcescens NOT DETECTED NOT DETECTED Final   Haemophilus influenzae NOT DETECTED NOT DETECTED Final   Neisseria meningitidis NOT DETECTED NOT DETECTED Final   Pseudomonas aeruginosa NOT DETECTED NOT DETECTED Final   Candida albicans NOT DETECTED NOT DETECTED Final   Candida glabrata NOT DETECTED NOT DETECTED Final   Candida krusei NOT DETECTED NOT DETECTED Final   Candida parapsilosis NOT DETECTED NOT DETECTED Final   Candida tropicalis NOT DETECTED NOT DETECTED Final    Comment: Performed at [facility]    Blood Culture (routine x 2)     Status: Abnormal   Collection Time: 07/07/16 12:50 AM  Result Value Ref Range Status   Specimen Description BLOOD BLOOD LEFT FOREARM  Final   Special Requests IN PEDIATRIC BOTTLE 3CC  Final   Culture  Setup Time   Final    GRAM POSITIVE COCCI IN CLUSTERS AEROBIC BOTTLE ONLY CRITICAL VALUE NOTED.  VALUE IS CONSISTENT WITH PREVIOUSLY REPORTED AND CALLED VALUE.    Culture (A)  Final    STAPHYLOCOCCUS AUREUS SUSCEPTIBILITIES PERFORMED ON PREVIOUS CULTURE WITHIN THE LAST 5 DAYS. Performed at [facility]    Report Status 07/09/2016 FINAL  Final  Urine culture     Status: None   Collection Time: 07/07/16  1:10 AM  Result Value Ref Range Status   Specimen Description URINE, CATHETERIZED  Final   Special Requests NONE  Final   Culture NO GROWTH Performed at [facility]   Final   Report Status 07/08/2016 FINAL  Final  MRSA PCR Screening     Status: Abnormal   Collection Time: 07/07/16  4:12 PM  Result Value Ref Range Status   MRSA by PCR POSITIVE (A) NEGATIVE Final    Comment:        The GeneXpert MRSA Assay (FDA approved for NASAL specimens only), is one component of a comprehensive MRSA colonization surveillance program. It is not intended to diagnose MRSA infection nor to guide or monitor treatment for MRSA infections. RESULT CALLED TO, READ BACK BY AND VERIFIED WITH: Alverda Skeans RN 17:50 07/07/16 (wilsonm)   Respiratory Panel by PCR     Status: None   Collection Time: 07/08/16  6:52 AM  Result Value Ref Range Status   Adenovirus NOT DETECTED NOT DETECTED Final   Coronavirus 229E NOT DETECTED NOT DETECTED Final   Coronavirus HKU1 NOT DETECTED NOT DETECTED Final   Coronavirus NL63 NOT DETECTED NOT DETECTED Final   Coronavirus OC43 NOT DETECTED NOT DETECTED  Final   Metapneumovirus NOT DETECTED NOT DETECTED Final   Rhinovirus / Enterovirus NOT DETECTED NOT DETECTED Final   Influenza A NOT DETECTED NOT DETECTED Final   Influenza  B NOT DETECTED NOT DETECTED Final   Parainfluenza Virus 1 NOT DETECTED NOT DETECTED Final   Parainfluenza Virus 2 NOT DETECTED NOT DETECTED Final   Parainfluenza Virus 3 NOT DETECTED NOT DETECTED Final   Parainfluenza Virus 4 NOT DETECTED NOT DETECTED Final   Respiratory Syncytial Virus NOT DETECTED NOT DETECTED Final   Bordetella pertussis NOT DETECTED NOT DETECTED Final   Chlamydophila pneumoniae NOT DETECTED NOT DETECTED Final   Mycoplasma pneumoniae NOT DETECTED NOT DETECTED Final  Culture, blood (routine x 2)     Status: None (Preliminary result)   Collection Time: 07/08/16  9:50 AM  Result Value Ref Range Status   Specimen Description BLOOD RIGHT HAND  Final   Special Requests BOTTLES DRAWN AEROBIC ONLY 5CC  Final   Culture NO GROWTH 1 DAY  Final   Report Status PENDING  Incomplete  Culture, blood (routine x 2)     Status: Abnormal (Preliminary result)   Collection Time: 07/08/16 10:00 AM  Result Value Ref Range Status   Specimen Description BLOOD LEFT HAND  Final   Special Requests IN PEDIATRIC BOTTLE 2CC  Final   Culture  Setup Time   Final    IN PEDIATRIC BOTTLE GRAM POSITIVE COCCI IN CLUSTERS CRITICAL RESULT CALLED TO, READ BACK BY AND VERIFIED WITH: J LEDFORD,PHARMD AT 0715 07/09/16 BY L BENFIELD    Culture (A)  Final    STAPHYLOCOCCUS AUREUS SUSCEPTIBILITIES TO FOLLOW    Report Status PENDING  Incomplete  Culture, blood (routine x 2)     Status: None (Preliminary result)   Collection Time: 07/09/16  6:24 PM  Result Value Ref Range Status   Specimen Description BLOOD LEFT FOREARM  Final   Special Requests IN PEDIATRIC BOTTLE 3CC  Final   Culture  Setup Time   Final    GRAM POSITIVE COCCI IN CLUSTERS IN PEDIATRIC BOTTLE CRITICAL RESULT CALLED TO, READ BACK BY AND VERIFIED WITH: A. JOHNSON [facility] [id number] 1120 BEAMJ    Culture PENDING  Incomplete   Report Status PENDING  Incomplete     Radiology Studies: No results found.  Scheduled Meds: . Chlorhexidine  Gluconate Cloth  6 each Topical Q0600  . enoxaparin (LOVENOX) injection  40 mg Subcutaneous Q24H  . feeding supplement (ENSURE ENLIVE)  237 mL Oral BID BM  . folic acid  1 mg Oral Daily  . multivitamin with minerals  1 tablet Oral Daily  . mupirocin ointment  1 application Nasal BID  . thiamine  100 mg Oral Daily   Or  . thiamine  100 mg Intravenous Daily  . vancomycin  1,000 mg Intravenous Q8H   Continuous Infusions:   LOS: 3 days    Time spent: > 35 minutes   Penny Pia, MD [facility] Pager [phone]  If 7PM-7AM, please contact night-coverage [email or web address] Password [facility] 07/10/2016, 2:38 PM

## 2016-07-10 NOTE — Progress Notes (Signed)
Pharmacy Antibiotic Note  Anna CaterConstance Colon is a 36 y.o. female with history of IV drug abuse released from jail 07/06/16 who presents with nausea, vomiting, and fever (tmax 104), and chills admitted on 07/07/2016 with sepsis.  Pharmacy has been consulted for vancomycin and Zosyn dosing. Antibiotics narrowed to vancomycin.  Vancomycin trough = 18 today on vancomycin 1000 mg IV q 8 hrs.  Plan: Continue vancomycin to 1g IV q 8 hrs Will continue to monitor renal function, and vancomycin trough periodically.  Height: 5\' 2"  (157.5 cm) Weight: 136 lb 11 oz (62 kg) IBW/kg (Calculated) : 50.1  Temp (24hrs), Avg:101.4 F (38.6 C), Min:98.8 F (37.1 C), Max:103.2 F (39.6 C)   Recent Labs Lab 07/07/16 0038 07/07/16 0040 07/07/16 0329 07/07/16 1003 07/07/16 1035 07/07/16 1821 07/07/16 1837 07/07/16 2127 07/08/16 0234 07/08/16 1357 07/10/16 0942 07/10/16 1318  WBC  --  19.6*  --   --   --   --  13.5*  --  10.7*  --  11.6*  --   CREATININE  --  1.10*  --   --  0.74  --  0.84  --  0.65  --  0.66  --   LATICACIDVEN 3.72*  --  0.76 1.42  --  3.3*  --  2.6*  --   --   --   --   VANCOTROUGH  --   --   --   --   --   --   --   --   --  15  --  18    Estimated Creatinine Clearance: 83.4 mL/min (by C-G formula based on SCr of 0.66 mg/dL).    No Known Allergies   Antimicrobials this admission:  Vanc 1/5 >>  Zosyn 1/5 >> 1/5  Dose adjustments this admission:  1/6 VT = 15 - on vanc 750 mg IV q 8 hrs 1/8 VT = 18 - on vanc 1g IV q 8 hrs  Microbiology results:  1/5 BCx: Staph aureus **1/5 BCID - MRSA 1/5 UCx: neg 1/5 MRSA PCR: (+) 1/6 resp panel pending 1/6 BCx x 2 > 1/2 GPC 1/7 BCx x 2 >    Thank you for allowing pharmacy to be a part of this patient's care.  Tad MooreJessica Jenkins Risdon, Pharm D, BCPS  Clinical Pharmacist Pager [phone]  07/10/2016 2:16 PM

## 2016-07-10 NOTE — Progress Notes (Signed)
Dear Doctor:  This patient has been identified as a candidate for PICC for the following reason (s): poor veins/poor circulatory system (CHF, COPD, emphysema, diabetes, steroid use, IV drug abuse, etc.) If you agree, please write an order for the indicated device. For any questions contact the Vascular Access Team at [phone] if no answer, please leave a message.  Thank you for supporting the early vascular access assessment program. 

## 2016-07-10 NOTE — Progress Notes (Signed)
Pt CIWA score 17, PRN orders were either discontinued or expired at 1930 this evening. Text page to floor cvg for orders. New orders placed.

## 2016-07-10 NOTE — Progress Notes (Addendum)
Pt temp 103.1 axillary after ibuprophen administration approx. 2hr ago. IV team also recommending PICC due to limited access, only able to obtain 24g. Text page to floor cvg for advisement. NP placed orders for PICC insertion and asked RN to give tylenol (RN had not since it did not help fever the previous night).

## 2016-07-11 DIAGNOSIS — Z95828 Presence of other vascular implants and grafts: Secondary | ICD-10-CM

## 2016-07-11 LAB — CULTURE, BLOOD (ROUTINE X 2)

## 2016-07-11 MED ORDER — SODIUM CHLORIDE 0.9% FLUSH
10.0000 mL | Freq: Two times a day (BID) | INTRAVENOUS | Status: DC
  Administered 2016-07-11 – 2016-07-12 (×3): 10 mL
  Administered 2016-07-13: 20 mL
  Administered 2016-07-14 – 2016-07-21 (×9): 10 mL
  Administered 2016-07-22: 40 mL
  Administered 2016-07-24 – 2016-08-06 (×7): 10 mL

## 2016-07-11 MED ORDER — SODIUM CHLORIDE 0.9% FLUSH
10.0000 mL | INTRAVENOUS | Status: DC | PRN
  Administered 2016-07-19 – 2016-07-31 (×4): 10 mL
  Filled 2016-07-11 (×4): qty 40

## 2016-07-11 MED ORDER — HYDROMORPHONE HCL 1 MG/ML IJ SOLN
1.0000 mg | INTRAMUSCULAR | Status: AC | PRN
  Administered 2016-07-11 – 2016-07-13 (×9): 1 mg via INTRAVENOUS
  Filled 2016-07-11 (×9): qty 1

## 2016-07-11 NOTE — Progress Notes (Addendum)
[facility]  Date of Admission:  07/07/2016           Day 5 vancomycin  Principal Problem:   MRSA bacteremia Active Problems:   Cavitary pneumonia   Endocarditis of tricuspid valve   Sepsis (HCC)   IVDU (intravenous drug user)   Cigarette smoker   ETOH abuse   Hepatitis C antibody test positive   Normocytic anemia   Thrombocytopenia (HCC)   . Chlorhexidine Gluconate Cloth  6 each Topical Q0600  . enoxaparin (LOVENOX) injection  40 mg Subcutaneous Q24H  . feeding supplement (ENSURE ENLIVE)  237 mL Oral BID BM  . folic acid  1 mg Oral Daily  . LORazepam  0-4 mg Intravenous Q6H   Followed by  . [START ON 07/12/2016] LORazepam  0-4 mg Intravenous Q12H  . multivitamin with minerals  1 tablet Oral Daily  . mupirocin ointment  1 application Nasal BID  . sodium chloride flush  10-40 mL Intracatheter Q12H  . thiamine  100 mg Oral Daily   Or  . thiamine  100 mg Intravenous Daily  . vancomycin  1,000 mg Intravenous Q8H    SUBJECTIVE: She is still complaining of pain all over, especially in her neck, chest and back.  Review of Systems: Review of Systems  Constitutional: Positive for chills, fever and malaise/fatigue. Negative for diaphoresis.  Cardiovascular: Positive for chest pain.  Gastrointestinal: Negative for abdominal pain, diarrhea, nausea and vomiting.  Genitourinary: Negative for dysuria.  Musculoskeletal: Positive for back pain and neck pain.    Past Medical History:  Diagnosis Date  . Asthma   . ETOH abuse   . Hepatitis C   . IVDU (intravenous drug user)     Social History  Substance Use Topics  . Smoking status: Current Every Day Smoker    Packs/day: 1.00  . Smokeless tobacco: Never Used  . Alcohol use No    History reviewed. No pertinent family history. No Known Allergies  OBJECTIVE: Vitals:   07/11/16 1211 07/11/16 1300 07/11/16 1400 07/11/16 1500  BP: (!) 151/98 (!) 118/92 (!) 130/97 133/83  Pulse: (!) 114  (!) 122 (!) 131 (!) 131  Resp: (!) 27 (!) 35 (!) 44 (!) 31  Temp: (!) 101.6 F (38.7 C)     TempSrc: Oral     SpO2: 100% 96% 98% 96%  Weight:      Height:       Body mass index is 25 kg/m.  Physical Exam  Constitutional: She is oriented to person, place, and time.  She is more alert today.  Cardiovascular: Normal rate and regular rhythm.   No murmur heard. Pulmonary/Chest: Effort normal and breath sounds normal.  Clear anteriorly.  Abdominal: Soft. There is no tenderness.  Musculoskeletal: Normal range of motion. She exhibits no edema or tenderness.  Neurological: She is alert and oriented to person, place, and time.  Skin: No rash noted.  Psychiatric: Mood and affect normal.    Lab Results Lab Results  Component Value Date   WBC 11.6 (H) 07/10/2016   HGB 8.6 (L) 07/10/2016   HCT 25.8 (L) 07/10/2016   MCV 81.6 07/10/2016   PLT 174 07/10/2016    Lab Results  Component Value Date   CREATININE 0.66 07/10/2016   BUN 6 07/10/2016   NA 132 (L) 07/10/2016   K 4.0 07/10/2016   CL 104 07/10/2016   CO2 23 07/10/2016    Lab Results  Component  Value Date   ALT 19 07/08/2016   AST 31 07/08/2016   ALKPHOS 93 07/08/2016   BILITOT 0.5 07/08/2016     Microbiology: Recent Results (from the past 240 hour(s))  Blood Culture (routine x 2)     Status: Abnormal   Collection Time: 07/07/16 12:40 AM  Result Value Ref Range Status   Specimen Description BLOOD BLOOD RIGHT FOREARM  Final   Special Requests BOTTLES DRAWN AEROBIC AND ANAEROBIC 5CC EACH  Final   Culture  Setup Time   Final    GRAM POSITIVE COCCI IN CLUSTERS IN BOTH AEROBIC AND ANAEROBIC BOTTLES CRITICAL RESULT CALLED TO, READ BACK BY AND VERIFIED WITH: LHosie Poisson.D. 17:15 07/07/16  ([email or web address]) Performed at [facility]    Culture METHICILLIN RESISTANT STAPHYLOCOCCUS AUREUS (A)  Final   Report Status 07/09/2016 FINAL  Final   Organism ID, Bacteria METHICILLIN RESISTANT STAPHYLOCOCCUS AUREUS  Final       Susceptibility   Methicillin resistant staphylococcus aureus - MIC*    CIPROFLOXACIN <=0.5 SENSITIVE Sensitive     ERYTHROMYCIN >=8 RESISTANT Resistant     GENTAMICIN <=0.5 SENSITIVE Sensitive     OXACILLIN >=4 RESISTANT Resistant     TETRACYCLINE <=1 SENSITIVE Sensitive     VANCOMYCIN 1 SENSITIVE Sensitive     TRIMETH/SULFA <=10 SENSITIVE Sensitive     CLINDAMYCIN <=0.25 SENSITIVE Sensitive     RIFAMPIN <=0.5 SENSITIVE Sensitive     Inducible Clindamycin NEGATIVE Sensitive     * METHICILLIN RESISTANT STAPHYLOCOCCUS AUREUS  Blood Culture ID Panel (Reflexed)     Status: Abnormal   Collection Time: 07/07/16 12:40 AM  Result Value Ref Range Status   Enterococcus species NOT DETECTED NOT DETECTED Final   Listeria monocytogenes NOT DETECTED NOT DETECTED Final   Staphylococcus species DETECTED (A) NOT DETECTED Final    Comment: CRITICAL RESULT CALLED TO, READ BACK BY AND VERIFIED WITH: LHosie Poisson.D. 17:15 07/07/16 (wilsonm)    Staphylococcus aureus DETECTED (A) NOT DETECTED Final    Comment: CRITICAL RESULT CALLED TO, READ BACK BY AND VERIFIED WITH: LHosie Poisson.D. 17:15 07/07/16 (wilsonm)    Methicillin resistance DETECTED (A) NOT DETECTED Final    Comment: CRITICAL RESULT CALLED TO, READ BACK BY AND VERIFIED WITH: LHosie Poisson.D. 17:15 07/07/16 (wilsonm)    Streptococcus species NOT DETECTED NOT DETECTED Final   Streptococcus agalactiae NOT DETECTED NOT DETECTED Final   Streptococcus pneumoniae NOT DETECTED NOT DETECTED Final   Streptococcus pyogenes NOT DETECTED NOT DETECTED Final   Acinetobacter baumannii NOT DETECTED NOT DETECTED Final   Enterobacteriaceae species NOT DETECTED NOT DETECTED Final   Enterobacter cloacae complex NOT DETECTED NOT DETECTED Final   Escherichia coli NOT DETECTED NOT DETECTED Final   Klebsiella oxytoca NOT DETECTED NOT DETECTED Final   Klebsiella pneumoniae NOT DETECTED NOT DETECTED Final   Proteus species NOT DETECTED NOT DETECTED Final    Serratia marcescens NOT DETECTED NOT DETECTED Final   Haemophilus influenzae NOT DETECTED NOT DETECTED Final   Neisseria meningitidis NOT DETECTED NOT DETECTED Final   Pseudomonas aeruginosa NOT DETECTED NOT DETECTED Final   Candida albicans NOT DETECTED NOT DETECTED Final   Candida glabrata NOT DETECTED NOT DETECTED Final   Candida krusei NOT DETECTED NOT DETECTED Final   Candida parapsilosis NOT DETECTED NOT DETECTED Final   Candida tropicalis NOT DETECTED NOT DETECTED Final    Comment: Performed at [facility]  Blood Culture (routine x 2)     Status: Abnormal   Collection Time:  07/07/16 12:50 AM  Result Value Ref Range Status   Specimen Description BLOOD BLOOD LEFT FOREARM  Final   Special Requests IN PEDIATRIC BOTTLE 3CC  Final   Culture  Setup Time   Final    GRAM POSITIVE COCCI IN CLUSTERS AEROBIC BOTTLE ONLY CRITICAL VALUE NOTED.  VALUE IS CONSISTENT WITH PREVIOUSLY REPORTED AND CALLED VALUE.    Culture (A)  Final    STAPHYLOCOCCUS AUREUS SUSCEPTIBILITIES PERFORMED ON PREVIOUS CULTURE WITHIN THE LAST 5 DAYS. [facility]    Report Status 07/09/2016 FINAL  Final  Urine culture     Status: None   Collection Time: 07/07/16  1:10 AM  Result Value Ref Range Status   Specimen Description URINE, CATHETERIZED  Final   Special Requests NONE  Final   Culture NO GROWTH [facility]   Final   Report Status 07/08/2016 FINAL  Final  MRSA PCR Screening     Status: Abnormal   Collection Time: 07/07/16  4:12 PM  Result Value Ref Range Status   MRSA by PCR POSITIVE (A) NEGATIVE Final    Comment:        The GeneXpert MRSA Assay (FDA approved for NASAL specimens only), is one component of a comprehensive MRSA colonization surveillance program. It is not intended to diagnose MRSA infection nor to guide or monitor treatment for MRSA infections. RESULT CALLED TO, READ BACK BY AND VERIFIED WITH: Alverda Skeans. Decena RN 17:50 07/07/16 (wilsonm)     Respiratory Panel by PCR     Status: None   Collection Time: 07/08/16  6:52 AM  Result Value Ref Range Status   Adenovirus NOT DETECTED NOT DETECTED Final   Coronavirus 229E NOT DETECTED NOT DETECTED Final   Coronavirus HKU1 NOT DETECTED NOT DETECTED Final   Coronavirus NL63 NOT DETECTED NOT DETECTED Final   Coronavirus OC43 NOT DETECTED NOT DETECTED Final   Metapneumovirus NOT DETECTED NOT DETECTED Final   Rhinovirus / Enterovirus NOT DETECTED NOT DETECTED Final   Influenza A NOT DETECTED NOT DETECTED Final   Influenza B NOT DETECTED NOT DETECTED Final   Parainfluenza Virus 1 NOT DETECTED NOT DETECTED Final   Parainfluenza Virus 2 NOT DETECTED NOT DETECTED Final   Parainfluenza Virus 3 NOT DETECTED NOT DETECTED Final   Parainfluenza Virus 4 NOT DETECTED NOT DETECTED Final   Respiratory Syncytial Virus NOT DETECTED NOT DETECTED Final   Bordetella pertussis NOT DETECTED NOT DETECTED Final   Chlamydophila pneumoniae NOT DETECTED NOT DETECTED Final   Mycoplasma pneumoniae NOT DETECTED NOT DETECTED Final  Culture, blood (routine x 2)     Status: None (Preliminary result)   Collection Time: 07/08/16  9:50 AM  Result Value Ref Range Status   Specimen Description BLOOD RIGHT HAND  Final   Special Requests BOTTLES DRAWN AEROBIC ONLY 5CC  Final   Culture NO GROWTH 3 DAYS  Final   Report Status PENDING  Incomplete  Culture, blood (routine x 2)     Status: Abnormal   Collection Time: 07/08/16 10:00 AM  Result Value Ref Range Status   Specimen Description BLOOD LEFT HAND  Final   Special Requests IN PEDIATRIC BOTTLE 2CC  Final   Culture  Setup Time   Final    IN PEDIATRIC BOTTLE GRAM POSITIVE COCCI IN CLUSTERS CRITICAL RESULT CALLED TO, READ BACK BY AND VERIFIED WITH: J LEDFORD,PHARMD AT 0715 07/09/16 BY L BENFIELD    Culture METHICILLIN RESISTANT STAPHYLOCOCCUS AUREUS (A)  Final   Report Status 07/11/2016  FINAL  Final   Organism ID, Bacteria METHICILLIN RESISTANT STAPHYLOCOCCUS AUREUS   Final      Susceptibility   Methicillin resistant staphylococcus aureus - MIC*    CIPROFLOXACIN <=0.5 SENSITIVE Sensitive     ERYTHROMYCIN >=8 RESISTANT Resistant     GENTAMICIN <=0.5 SENSITIVE Sensitive     OXACILLIN >=4 RESISTANT Resistant     TETRACYCLINE <=1 SENSITIVE Sensitive     VANCOMYCIN 1 SENSITIVE Sensitive     TRIMETH/SULFA <=10 SENSITIVE Sensitive     CLINDAMYCIN <=0.25 SENSITIVE Sensitive     RIFAMPIN <=0.5 SENSITIVE Sensitive     Inducible Clindamycin NEGATIVE Sensitive     * METHICILLIN RESISTANT STAPHYLOCOCCUS AUREUS  Culture, blood (routine x 2)     Status: Abnormal   Collection Time: 07/09/16  6:24 PM  Result Value Ref Range Status   Specimen Description BLOOD LEFT FOREARM  Final   Special Requests IN PEDIATRIC BOTTLE 3CC  Final   Culture  Setup Time   Final    GRAM POSITIVE COCCI IN CLUSTERS IN PEDIATRIC BOTTLE CRITICAL RESULT CALLED TO, READ BACK BY AND VERIFIED WITH: ALaural Benes PHARM [id number]    Culture (A)  Final    STAPHYLOCOCCUS AUREUS SUSCEPTIBILITIES PERFORMED ON PREVIOUS CULTURE WITHIN THE LAST 5 DAYS.    Report Status 07/11/2016 FINAL  Final  Culture, blood (routine x 2)     Status: None (Preliminary result)   Collection Time: 07/09/16  6:25 PM  Result Value Ref Range Status   Specimen Description BLOOD LEFT HAND  Final   Special Requests BOTTLES DRAWN AEROBIC ONLY 6CC  Final   Culture NO GROWTH 2 DAYS  Final   Report Status PENDING  Incomplete  Culture, blood (routine x 2)     Status: None (Preliminary result)   Collection Time: 07/11/16 10:10 AM  Result Value Ref Range Status   Specimen Description BLOOD LEFT ANTECUBITAL  Final   Special Requests BOTTLES DRAWN AEROBIC ONLY 5CC  Final   Culture NO GROWTH < 12 HOURS  Final   Report Status PENDING  Incomplete  Culture, blood (routine x 2)     Status: None (Preliminary result)   Collection Time: 07/11/16 10:15 AM  Result Value Ref Range Status   Specimen Description BLOOD LEFT ARM   Final   Special Requests BOTTLES DRAWN AEROBIC ONLY 5CC  Final   Culture NO GROWTH < 12 HOURS  Final   Report Status PENDING  Incomplete     ASSESSMENT: She continues to have fever on therapy for MRSA bacteremia, tricuspid valve endocarditis and septic pulmonary emboli. Blood cultures 2 days ago were still positive. Blood cultures were repeated this morning. A PICC line was placed because of her very poor IV access. She tells me that she will be living with her father after she is discharged. She gave me permission to talk to him about her illness and management plans.  She is hepatitis C antibody positive but her hepatitis C viral load was undetectable reflecting spontaneous clearing of her infection.  PLAN: 1. Continue vancomycin 2. Await results of repeat blood cultures done this morning  [name], MD [facility] [phone] pager   [phone] cell 07/11/2016, 3:56 PM

## 2016-07-11 NOTE — Progress Notes (Signed)
PROGRESS NOTE    Anna Colon  ZOX:[id number] DOB: 09-03-1980 DOA: 07/07/2016 PCP: No PCP Per Patient    Brief Narrative:   36 y.o. female  cervical history is for asthma, hep C and intravenous drug use presents emergency room with chief complaint of weakness fatigue cough. Patient states that about 2 weeks ago she began to have cough and weakness.   Pt complaining of back discomfort but reports that the current pain medication regimen works  Assessment & Plan:   Principal Problem:   MRSA bacteremia - Continue vancomycin intravenously - + tricuspid vegetation with no regurgitation - ID assisting and plan is to continue current antibiotic regimen and await repeat bld cx's  Active Problems:   Sepsis (HCC) - secondary to IVDU most likely.  - Continue IV antibiotics  Hypokalemia - replace and resolved on last check    Cavitary pneumonia - Most likely due to MRSA bacteremia    IVDU (intravenous drug user) - with improvement in condition with consult social worker.    Cigarette smoker - With improvement in condition will plan on discussing cessation    ETOH abuse - Pt is on CIWA protocol    Hepatitis C antibody test positive   Normocytic anemia   Thrombocytopenia (HCC)   DVT prophylaxis: Lovenox Code Status: Full Family Communication: none at bedside Disposition Plan: continue step down evaluation   Consultants:  ID   Procedures: None   Antimicrobials:  Vancomycin   Subjective: Pt has no new complaints, current paint medication helping with discomfort.  Objective: Vitals:   07/11/16 1211 07/11/16 1300 07/11/16 1400 07/11/16 1500  BP: (!) 151/98 (!) 118/92 (!) 130/97 133/83  Pulse: (!) 114 (!) 122 (!) 131 (!) 131  Resp: (!) 27 (!) 35 (!) 44 (!) 31  Temp: (!) 101.6 F (38.7 C)     TempSrc: Oral     SpO2: 100% 96% 98% 96%  Weight:      Height:        Intake/Output Summary (Last 24 hours) at 07/11/16 1623 Last data filed at 07/11/16 0618  Gross  per 24 hour  Intake              600 ml  Output              750 ml  Net             -150 ml   Filed Weights   07/09/16 0411 07/10/16 0400 07/11/16 0300  Weight: 61.8 kg (136 lb 3.9 oz) 62 kg (136 lb 11 oz) 62 kg (136 lb 11 oz)    Examination:  General exam: Awake and alert, in nad. Respiratory system: equal chest rise, no wheezes Cardiovascular system: S1 & S2 heard, RRR. + murmur Gastrointestinal system: Abdomen is nondistended, soft and nontender.  Central nervous system: Alert and oriented. No focal neurological deficits. Extremities: Symmetric 5 x 5 power, no cyanosis Skin: No rashes, lesions or ulcers, on limited exam. Psychiatry: Judgement and insight appear normal. Mood & affect appropriate.    Data Reviewed: I have personally reviewed following labs and imaging studies  CBC:  Recent Labs Lab 07/07/16 0040 07/07/16 1837 07/08/16 0234 07/10/16 0942  WBC 19.6* 13.5* 10.7* 11.6*  NEUTROABS 16.8*  --   --  9.0*  HGB 12.1 9.2* 7.9* 8.6*  HCT 36.0 28.1* 23.4* 25.8*  MCV 80.4 80.5 81.5 81.6  PLT 115* 111* 103* 174   Basic Metabolic Panel:  Recent Labs Lab 07/07/16 0040 07/07/16 1035 07/07/16  1837 07/08/16 0234 07/10/16 0942  NA 128* 134*  --  135 132*  K 2.9* 3.2*  --  3.3* 4.0  CL 88* 110  --  110 104  CO2 27 19*  --  19* 23  GLUCOSE 125* 105*  --  97 98  BUN 15 11  --  5* 6  CREATININE 1.10* 0.74 0.84 0.65 0.66  CALCIUM 8.3* 6.9*  --  6.9* 7.5*  MG  --   --  1.9  --   --   PHOS  --   --  1.2*  --   --    GFR: Estimated Creatinine Clearance: 83.4 mL/min (by C-G formula based on SCr of 0.66 mg/dL). Liver Function Tests:  Recent Labs Lab 07/07/16 0040 07/07/16 1035 07/08/16 0234  AST 80* 44* 31  ALT 38 23 19  ALKPHOS 166* 110 93  BILITOT 0.9 0.8 0.5  PROT 7.0 4.8* 4.4*  ALBUMIN 2.5* 1.6* 1.4*   No results for input(s): LIPASE, AMYLASE in the last 168 hours. No results for input(s): AMMONIA in the last 168 hours. Coagulation  Profile:  Recent Labs Lab 07/08/16 0234  INR 1.51   Cardiac Enzymes:  Recent Labs Lab 07/07/16 0945 07/07/16 1837 07/08/16 0234 07/08/16 1002  TROPONINI <0.03 0.04* 0.08* <0.03   BNP (last 3 results) No results for input(s): PROBNP in the last 8760 hours. HbA1C: No results for input(s): HGBA1C in the last 72 hours. CBG: No results for input(s): GLUCAP in the last 168 hours. Lipid Profile: No results for input(s): CHOL, HDL, LDLCALC, TRIG, CHOLHDL, LDLDIRECT in the last 72 hours. Thyroid Function Tests: No results for input(s): TSH, T4TOTAL, FREET4, T3FREE, THYROIDAB in the last 72 hours. Anemia Panel: No results for input(s): VITAMINB12, FOLATE, FERRITIN, TIBC, IRON, RETICCTPCT in the last 72 hours. Sepsis Labs:  Recent Labs Lab 07/07/16 0329 07/07/16 1003 07/07/16 1821 07/07/16 2127  LATICACIDVEN 0.76 1.42 3.3* 2.6*    Recent Results (from the past 240 hour(s))  Blood Culture (routine x 2)     Status: Abnormal   Collection Time: 07/07/16 12:40 AM  Result Value Ref Range Status   Specimen Description BLOOD BLOOD RIGHT FOREARM  Final   Special Requests BOTTLES DRAWN AEROBIC AND ANAEROBIC 5CC EACH  Final   Culture  Setup Time   Final    GRAM POSITIVE COCCI IN CLUSTERS IN BOTH AEROBIC AND ANAEROBIC BOTTLES CRITICAL RESULT CALLED TO, READ BACK BY AND VERIFIED WITH: LHosie Poisson.D. 17:15 07/07/16  ([email or web address]) Performed at [facility]    Culture METHICILLIN RESISTANT STAPHYLOCOCCUS AUREUS (A)  Final   Report Status 07/09/2016 FINAL  Final   Organism ID, Bacteria METHICILLIN RESISTANT STAPHYLOCOCCUS AUREUS  Final      Susceptibility   Methicillin resistant staphylococcus aureus - MIC*    CIPROFLOXACIN <=0.5 SENSITIVE Sensitive     ERYTHROMYCIN >=8 RESISTANT Resistant     GENTAMICIN <=0.5 SENSITIVE Sensitive     OXACILLIN >=4 RESISTANT Resistant     TETRACYCLINE <=1 SENSITIVE Sensitive     VANCOMYCIN 1 SENSITIVE Sensitive     TRIMETH/SULFA <=10 SENSITIVE  Sensitive     CLINDAMYCIN <=0.25 SENSITIVE Sensitive     RIFAMPIN <=0.5 SENSITIVE Sensitive     Inducible Clindamycin NEGATIVE Sensitive     * METHICILLIN RESISTANT STAPHYLOCOCCUS AUREUS  Blood Culture ID Panel (Reflexed)     Status: Abnormal   Collection Time: 07/07/16 12:40 AM  Result Value Ref Range Status   Enterococcus species NOT DETECTED NOT DETECTED  Final   Listeria monocytogenes NOT DETECTED NOT DETECTED Final   Staphylococcus species DETECTED (A) NOT DETECTED Final    Comment: CRITICAL RESULT CALLED TO, READ BACK BY AND VERIFIED WITH: LHosie Poisson. [place] Pharm.D. 17:15 07/07/16 ([email or web address])    Staphylococcus aureus DETECTED (A) NOT DETECTED Final    Comment: CRITICAL RESULT CALLED TO, READ BACK BY AND VERIFIED WITH: LHosie Poisson. [place] Pharm.D. 17:15 07/07/16 (wilsonm)    Methicillin resistance DETECTED (A) NOT DETECTED Final    Comment: CRITICAL RESULT CALLED TO, READ BACK BY AND VERIFIED WITH: LHosie Poisson. Curran Pharm.D. 17:15 07/07/16 (wilsonm)    Streptococcus species NOT DETECTED NOT DETECTED Final   Streptococcus agalactiae NOT DETECTED NOT DETECTED Final   Streptococcus pneumoniae NOT DETECTED NOT DETECTED Final   Streptococcus pyogenes NOT DETECTED NOT DETECTED Final   Acinetobacter baumannii NOT DETECTED NOT DETECTED Final   Enterobacteriaceae species NOT DETECTED NOT DETECTED Final   Enterobacter cloacae complex NOT DETECTED NOT DETECTED Final   Escherichia coli NOT DETECTED NOT DETECTED Final   Klebsiella oxytoca NOT DETECTED NOT DETECTED Final   Klebsiella pneumoniae NOT DETECTED NOT DETECTED Final   Proteus species NOT DETECTED NOT DETECTED Final   Serratia marcescens NOT DETECTED NOT DETECTED Final   Haemophilus influenzae NOT DETECTED NOT DETECTED Final   Neisseria meningitidis NOT DETECTED NOT DETECTED Final   Pseudomonas aeruginosa NOT DETECTED NOT DETECTED Final   Candida albicans NOT DETECTED NOT DETECTED Final   Candida glabrata NOT DETECTED NOT DETECTED Final   Candida krusei NOT  DETECTED NOT DETECTED Final   Candida parapsilosis NOT DETECTED NOT DETECTED Final   Candida tropicalis NOT DETECTED NOT DETECTED Final    Comment: Performed [facility]  Blood Culture (routine x 2)     Status: Abnormal   Collection Time: 07/07/16 12:50 AM  Result Value Ref Range Status   Specimen Description BLOOD BLOOD LEFT FOREARM  Final   Special Requests IN PEDIATRIC BOTTLE 3CC  Final   Culture  Setup Time   Final    GRAM POSITIVE COCCI IN CLUSTERS AEROBIC BOTTLE ONLY CRITICAL VALUE NOTED.  VALUE IS CONSISTENT WITH PREVIOUSLY REPORTED AND CALLED VALUE.    Culture (A)  Final    STAPHYLOCOCCUS AUREUS SUSCEPTIBILITIES PERFORMED ON PREVIOUS CULTURE WITHIN THE LAST 5 DAYS. Performed [facility]    Report Status 07/09/2016 FINAL  Final  Urine culture     Status: None   Collection Time: 07/07/16  1:10 AM  Result Value Ref Range Status   Specimen Description URINE, CATHETERIZED  Final   Special Requests NONE  Final   Culture NO GROWTH Performed at [facility]   Final   Report Status 07/08/2016 FINAL  Final  MRSA PCR Screening     Status: Abnormal   Collection Time: 07/07/16  4:12 PM  Result Value Ref Range Status   MRSA by PCR POSITIVE (A) NEGATIVE Final    Comment:        The GeneXpert MRSA Assay (FDA approved for NASAL specimens only), is one component of a comprehensive MRSA colonization surveillance program. It is not intended to diagnose MRSA infection nor to guide or monitor treatment for MRSA infections. RESULT CALLED TO, READ BACK BY AND VERIFIED WITH: Alverda Skeans. Decena RN 17:50 07/07/16 (wilsonm)   Respiratory Panel by PCR     Status: None   Collection Time: 07/08/16  6:52 AM  Result Value Ref Range Status   Adenovirus NOT DETECTED NOT DETECTED Final   Coronavirus 229E NOT DETECTED  NOT DETECTED Final   Coronavirus HKU1 NOT DETECTED NOT DETECTED Final   Coronavirus NL63 NOT DETECTED NOT DETECTED Final   Coronavirus OC43 NOT DETECTED NOT  DETECTED Final   Metapneumovirus NOT DETECTED NOT DETECTED Final   Rhinovirus / Enterovirus NOT DETECTED NOT DETECTED Final   Influenza A NOT DETECTED NOT DETECTED Final   Influenza B NOT DETECTED NOT DETECTED Final   Parainfluenza Virus 1 NOT DETECTED NOT DETECTED Final   Parainfluenza Virus 2 NOT DETECTED NOT DETECTED Final   Parainfluenza Virus 3 NOT DETECTED NOT DETECTED Final   Parainfluenza Virus 4 NOT DETECTED NOT DETECTED Final   Respiratory Syncytial Virus NOT DETECTED NOT DETECTED Final   Bordetella pertussis NOT DETECTED NOT DETECTED Final   Chlamydophila pneumoniae NOT DETECTED NOT DETECTED Final   Mycoplasma pneumoniae NOT DETECTED NOT DETECTED Final  Culture, blood (routine x 2)     Status: None (Preliminary result)   Collection Time: 07/08/16  9:50 AM  Result Value Ref Range Status   Specimen Description BLOOD RIGHT HAND  Final   Special Requests BOTTLES DRAWN AEROBIC ONLY 5CC  Final   Culture NO GROWTH 3 DAYS  Final   Report Status PENDING  Incomplete  Culture, blood (routine x 2)     Status: Abnormal   Collection Time: 07/08/16 10:00 AM  Result Value Ref Range Status   Specimen Description BLOOD LEFT HAND  Final   Special Requests IN PEDIATRIC BOTTLE 2CC  Final   Culture  Setup Time   Final    IN PEDIATRIC BOTTLE GRAM POSITIVE COCCI IN CLUSTERS CRITICAL RESULT CALLED TO, READ BACK BY AND VERIFIED WITH: J LEDFORD,PHARMD AT 0715 07/09/16 BY L BENFIELD    Culture METHICILLIN RESISTANT STAPHYLOCOCCUS AUREUS (A)  Final   Report Status 07/11/2016 FINAL  Final   Organism ID, Bacteria METHICILLIN RESISTANT STAPHYLOCOCCUS AUREUS  Final      Susceptibility   Methicillin resistant staphylococcus aureus - MIC*    CIPROFLOXACIN <=0.5 SENSITIVE Sensitive     ERYTHROMYCIN >=8 RESISTANT Resistant     GENTAMICIN <=0.5 SENSITIVE Sensitive     OXACILLIN >=4 RESISTANT Resistant     TETRACYCLINE <=1 SENSITIVE Sensitive     VANCOMYCIN 1 SENSITIVE Sensitive     TRIMETH/SULFA <=10  SENSITIVE Sensitive     CLINDAMYCIN <=0.25 SENSITIVE Sensitive     RIFAMPIN <=0.5 SENSITIVE Sensitive     Inducible Clindamycin NEGATIVE Sensitive     * METHICILLIN RESISTANT STAPHYLOCOCCUS AUREUS  Culture, blood (routine x 2)     Status: Abnormal   Collection Time: 07/09/16  6:24 PM  Result Value Ref Range Status   Specimen Description BLOOD LEFT FOREARM  Final   Special Requests IN PEDIATRIC BOTTLE 3CC  Final   Culture  Setup Time   Final    GRAM POSITIVE COCCI IN CLUSTERS IN PEDIATRIC BOTTLE CRITICAL RESULT CALLED TO, READ BACK BY AND VERIFIED WITH: ALaural Benes PHARM [id number]    Culture (A)  Final    STAPHYLOCOCCUS AUREUS SUSCEPTIBILITIES PERFORMED ON PREVIOUS CULTURE WITHIN THE LAST 5 DAYS.    Report Status 07/11/2016 FINAL  Final  Culture, blood (routine x 2)     Status: None (Preliminary result)   Collection Time: 07/09/16  6:25 PM  Result Value Ref Range Status   Specimen Description BLOOD LEFT HAND  Final   Special Requests BOTTLES DRAWN AEROBIC ONLY 6CC  Final   Culture NO GROWTH 2 DAYS  Final   Report Status PENDING  Incomplete  Culture, blood (routine x 2)     Status: None (Preliminary result)   Collection Time: 07/11/16 10:10 AM  Result Value Ref Range Status   Specimen Description BLOOD LEFT ANTECUBITAL  Final   Special Requests BOTTLES DRAWN AEROBIC ONLY 5CC  Final   Culture NO GROWTH < 12 HOURS  Final   Report Status PENDING  Incomplete  Culture, blood (routine x 2)     Status: None (Preliminary result)   Collection Time: 07/11/16 10:15 AM  Result Value Ref Range Status   Specimen Description BLOOD LEFT ARM  Final   Special Requests BOTTLES DRAWN AEROBIC ONLY 5CC  Final   Culture NO GROWTH < 12 HOURS  Final   Report Status PENDING  Incomplete     Radiology Studies: No results found.  Scheduled Meds: . Chlorhexidine Gluconate Cloth  6 each Topical Q0600  . enoxaparin (LOVENOX) injection  40 mg Subcutaneous Q24H  . feeding supplement (ENSURE  ENLIVE)  237 mL Oral BID BM  . folic acid  1 mg Oral Daily  . LORazepam  0-4 mg Intravenous Q6H   Followed by  . [START ON 07/12/2016] LORazepam  0-4 mg Intravenous Q12H  . multivitamin with minerals  1 tablet Oral Daily  . mupirocin ointment  1 application Nasal BID  . sodium chloride flush  10-40 mL Intracatheter Q12H  . thiamine  100 mg Oral Daily   Or  . thiamine  100 mg Intravenous Daily  . vancomycin  1,000 mg Intravenous Q8H   Continuous Infusions:   LOS: 4 days    Time spent: > 35 minutes   Penny Pia, MD [facility] Pager [phone]  If 7PM-7AM, please contact night-coverage [email or web address] Password [facility] 07/11/2016, 4:23 PM

## 2016-07-11 NOTE — Progress Notes (Signed)
CSW received consult regarding substance use. CSW attempted to see patient, but patient was asleep and would not arouse. CSW will attempt to see again as time allows.   Anna Colon LCSWA [phone]

## 2016-07-11 NOTE — Progress Notes (Signed)
RN walking by room noticed that patient had a visitor who was not her father, whom RN has previously met. As RN entered the room, overhead patients father arguing with the visitor, father states visitors name is "Anna Colon." Father began yelling at visitor and telling him to get out. Vocal argument ensued and both men came out into the hallway with raised voices and exhibiting aggressive behavior. "Bill" left after patients father kept insisting that he leave. Per father, this man is patients "pimp and drug dealer," states that he is "sure Anna Colon is going to bring her heroin or cocaine." Father states he is "done with [facility]," and will not visit her again. Since patient is A&O and able to make her own decisions, RN and charge nurse informed father that the unit is unable to physically prohibit visitors if patient is inviting them into her room. Per patient, "Anna Colon" is her "friend," and does not suggest he is anything but. Security, [employer] and AC notified, all arrived (except AC) and present. At this time, patients father walked out of the hospital, thanking the RNs and apologizing for the commotion, but states he will not be back. NP on call paged and will be notified of these events. Informed patient that if an altercation like this happens again, all visitors will need to be restricted for patients, visitors and staff safety. Pt verbalizes understanding. Patient has cell phone at bedside and states she is receiving calls from friends who want to visit her and is inviting them into her room.

## 2016-07-11 NOTE — Progress Notes (Signed)
Peripherally Inserted Central Catheter/Midline Placement  The IV Nurse has discussed with the patient and/or persons authorized to consent for the patient, the purpose of this procedure and the potential benefits and risks involved with this procedure.  The benefits include less needle sticks, lab draws from the catheter, and the patient may be discharged home with the catheter. Risks include, but not limited to, infection, bleeding, blood clot (thrombus formation), and puncture of an artery; nerve damage and irregular heartbeat and possibility to perform a PICC exchange if needed/ordered by physician.  Alternatives to this procedure were also discussed.  Bard Power PICC patient education guide, fact sheet on infection prevention and patient information card has been provided to patient /or left at bedside.    PICC/Midline Placement Documentation        Timmothy Soursewman, Ovidio Steele Renee 07/11/2016, 2:15 PM

## 2016-07-12 DIAGNOSIS — A4102 Sepsis due to Methicillin resistant Staphylococcus aureus: Principal | ICD-10-CM

## 2016-07-12 DIAGNOSIS — J984 Other disorders of lung: Secondary | ICD-10-CM

## 2016-07-12 DIAGNOSIS — J189 Pneumonia, unspecified organism: Secondary | ICD-10-CM

## 2016-07-12 DIAGNOSIS — I368 Other nonrheumatic tricuspid valve disorders: Secondary | ICD-10-CM

## 2016-07-12 LAB — BASIC METABOLIC PANEL
Anion gap: 6 (ref 5–15)
BUN: 6 mg/dL (ref 6–20)
CALCIUM: 7.9 mg/dL — AB (ref 8.9–10.3)
CHLORIDE: 102 mmol/L (ref 101–111)
CO2: 26 mmol/L (ref 22–32)
Creatinine, Ser: 0.66 mg/dL (ref 0.44–1.00)
Glucose, Bld: 112 mg/dL — ABNORMAL HIGH (ref 65–99)
Potassium: 3.7 mmol/L (ref 3.5–5.1)
Sodium: 134 mmol/L — ABNORMAL LOW (ref 135–145)

## 2016-07-12 LAB — CBC
HCT: 26.9 % — ABNORMAL LOW (ref 36.0–46.0)
Hemoglobin: 8.7 g/dL — ABNORMAL LOW (ref 12.0–15.0)
MCH: 26.3 pg (ref 26.0–34.0)
MCHC: 32.3 g/dL (ref 30.0–36.0)
MCV: 81.3 fL (ref 78.0–100.0)
PLATELETS: 224 10*3/uL (ref 150–400)
RBC: 3.31 MIL/uL — ABNORMAL LOW (ref 3.87–5.11)
RDW: 16.1 % — AB (ref 11.5–15.5)
WBC: 11.2 10*3/uL — AB (ref 4.0–10.5)

## 2016-07-12 LAB — IRON AND TIBC
IRON: 13 ug/dL — AB (ref 28–170)
Saturation Ratios: 8 % — ABNORMAL LOW (ref 10.4–31.8)
TIBC: 155 ug/dL — ABNORMAL LOW (ref 250–450)
UIBC: 142 ug/dL

## 2016-07-12 MED ORDER — LORAZEPAM 1 MG PO TABS
1.0000 mg | ORAL_TABLET | Freq: Once | ORAL | Status: AC
  Administered 2016-07-12: 1 mg via ORAL

## 2016-07-12 MED ORDER — NICOTINE 21 MG/24HR TD PT24
21.0000 mg | MEDICATED_PATCH | Freq: Every day | TRANSDERMAL | Status: DC
  Administered 2016-07-12 – 2016-07-15 (×4): 21 mg via TRANSDERMAL
  Filled 2016-07-12 (×14): qty 1

## 2016-07-12 MED ORDER — DIPHENHYDRAMINE HCL 25 MG PO CAPS
25.0000 mg | ORAL_CAPSULE | Freq: Four times a day (QID) | ORAL | Status: AC | PRN
  Administered 2016-07-13 – 2016-07-17 (×3): 25 mg via ORAL
  Filled 2016-07-12 (×3): qty 1

## 2016-07-12 MED ORDER — ALBUTEROL SULFATE (2.5 MG/3ML) 0.083% IN NEBU
2.5000 mg | INHALATION_SOLUTION | Freq: Four times a day (QID) | RESPIRATORY_TRACT | Status: DC | PRN
  Administered 2016-07-12 – 2016-07-19 (×4): 2.5 mg via RESPIRATORY_TRACT
  Filled 2016-07-12 (×4): qty 3

## 2016-07-12 MED ORDER — SODIUM CHLORIDE 0.9 % IV SOLN
INTRAVENOUS | Status: AC
  Administered 2016-07-12 – 2016-07-13 (×2): via INTRAVENOUS

## 2016-07-12 MED ORDER — KETOROLAC TROMETHAMINE 15 MG/ML IJ SOLN
15.0000 mg | Freq: Four times a day (QID) | INTRAMUSCULAR | Status: DC | PRN
  Administered 2016-07-12 – 2016-07-13 (×3): 15 mg via INTRAVENOUS
  Filled 2016-07-12 (×6): qty 1

## 2016-07-12 NOTE — Progress Notes (Signed)
[facility]  Date of Admission:  07/07/2016           Day 6 vancomycin  Principal Problem:   MRSA bacteremia Active Problems:   Cavitary pneumonia   Endocarditis of tricuspid valve   Sepsis (HCC)   IVDU (intravenous drug user)   Cigarette smoker   ETOH abuse   Hepatitis C antibody test positive   Normocytic anemia   Thrombocytopenia (HCC)   . Chlorhexidine Gluconate Cloth  6 each Topical Q0600  . enoxaparin (LOVENOX) injection  40 mg Subcutaneous Q24H  . feeding supplement (ENSURE ENLIVE)  237 mL Oral BID BM  . folic acid  1 mg Oral Daily  . LORazepam  0-4 mg Intravenous Q6H   Followed by  . LORazepam  0-4 mg Intravenous Q12H  . multivitamin with minerals  1 tablet Oral Daily  . nicotine  21 mg Transdermal Daily  . sodium chloride flush  10-40 mL Intracatheter Q12H  . thiamine  100 mg Oral Daily   Or  . thiamine  100 mg Intravenous Daily  . vancomycin  1,000 mg Intravenous Q8H    SUBJECTIVE: Continues to complain of pain, worse when coughing. Her cough is nonproductive.  Review of Systems: Review of Systems  Constitutional: Positive for fever and malaise/fatigue. Negative for chills, diaphoresis and weight loss.  HENT: Negative for sore throat.   Respiratory: Positive for cough. Negative for sputum production and shortness of breath.   Cardiovascular: Positive for chest pain.  Gastrointestinal: Negative for abdominal pain, diarrhea, heartburn, nausea and vomiting.  Genitourinary: Negative for dysuria and frequency.  Musculoskeletal: Positive for back pain and neck pain. Negative for joint pain.  Skin: Negative for rash.  Neurological: Negative for focal weakness.    Past Medical History:  Diagnosis Date  . Asthma   . ETOH abuse   . Hepatitis C   . IVDU (intravenous drug user)     Social History  Substance Use Topics  . Smoking status: Current Every Day Smoker    Packs/day: 1.00  . Smokeless tobacco: Never Used  .  Alcohol use No    History reviewed. No pertinent family history. No Known Allergies  OBJECTIVE: Vitals:   07/12/16 0700 07/12/16 0751 07/12/16 1000 07/12/16 1111  BP: (!) 141/93 (!) 141/93 122/83 110/79  Pulse: (!) 101 (!) 115  (!) 109  Resp: (!) 29 (!) 35 (!) 22 (!) 25  Temp:  (!) 102.7 F (39.3 C)  98.1 F (36.7 C)  TempSrc:  Oral  Oral  SpO2: 97% 100%  94%  Weight:      Height:       Body mass index is 23.98 kg/m.  Physical Exam  Constitutional: She is oriented to person, place, and time.  She is sitting up in a chair. She is more alert.  HENT:  Mouth/Throat: No oropharyngeal exudate.  Eyes: Conjunctivae are normal.  Cardiovascular: Regular rhythm.   No murmur heard. Tachycardia.  Pulmonary/Chest: Effort normal. She has no wheezes. She has rales.  Abdominal: Soft. She exhibits no mass. There is no tenderness.  Musculoskeletal: Normal range of motion. She exhibits no edema or tenderness.  Neurological: She is alert and oriented to person, place, and time.  Skin: No rash noted.    Lab Results Lab Results  Component Value Date   WBC 11.2 (H) 07/12/2016   HGB 8.7 (L) 07/12/2016   HCT 26.9 (L) 07/12/2016   MCV 81.3 07/12/2016  PLT 224 07/12/2016    Lab Results  Component Value Date   CREATININE 0.66 07/12/2016   BUN 6 07/12/2016   NA 134 (L) 07/12/2016   K 3.7 07/12/2016   CL 102 07/12/2016   CO2 26 07/12/2016    Lab Results  Component Value Date   ALT 19 07/08/2016   AST 31 07/08/2016   ALKPHOS 93 07/08/2016   BILITOT 0.5 07/08/2016     Microbiology: Recent Results (from the past 240 hour(s))  Blood Culture (routine x 2)     Status: Abnormal   Collection Time: 07/07/16 12:40 AM  Result Value Ref Range Status   Specimen Description BLOOD BLOOD RIGHT FOREARM  Final   Special Requests BOTTLES DRAWN AEROBIC AND ANAEROBIC 5CC EACH  Final   Culture  Setup Time   Final    GRAM POSITIVE COCCI IN CLUSTERS IN BOTH AEROBIC AND ANAEROBIC BOTTLES CRITICAL  RESULT CALLED TO, READ BACK BY AND VERIFIED WITH: Lowella Bandy.D. 17:15 07/07/16  (wilsonm) Performed at [facility]    Culture METHICILLIN RESISTANT STAPHYLOCOCCUS AUREUS (A)  Final   Report Status 07/09/2016 FINAL  Final   Organism ID, Bacteria METHICILLIN RESISTANT STAPHYLOCOCCUS AUREUS  Final      Susceptibility   Methicillin resistant staphylococcus aureus - MIC*    CIPROFLOXACIN <=0.5 SENSITIVE Sensitive     ERYTHROMYCIN >=8 RESISTANT Resistant     GENTAMICIN <=0.5 SENSITIVE Sensitive     OXACILLIN >=4 RESISTANT Resistant     TETRACYCLINE <=1 SENSITIVE Sensitive     VANCOMYCIN 1 SENSITIVE Sensitive     TRIMETH/SULFA <=10 SENSITIVE Sensitive     CLINDAMYCIN <=0.25 SENSITIVE Sensitive     RIFAMPIN <=0.5 SENSITIVE Sensitive     Inducible Clindamycin NEGATIVE Sensitive     * METHICILLIN RESISTANT STAPHYLOCOCCUS AUREUS  Blood Culture ID Panel (Reflexed)     Status: Abnormal   Collection Time: 07/07/16 12:40 AM  Result Value Ref Range Status   Enterococcus species NOT DETECTED NOT DETECTED Final   Listeria monocytogenes NOT DETECTED NOT DETECTED Final   Staphylococcus species DETECTED (A) NOT DETECTED Final    Comment: CRITICAL RESULT CALLED TO, READ BACK BY AND VERIFIED WITH: LHosie Poisson.D. 17:15 07/07/16 (wilsonm)    Staphylococcus aureus DETECTED (A) NOT DETECTED Final    Comment: CRITICAL RESULT CALLED TO, READ BACK BY AND VERIFIED WITH: LHosie Poisson.D. 17:15 07/07/16 (wilsonm)    Methicillin resistance DETECTED (A) NOT DETECTED Final    Comment: CRITICAL RESULT CALLED TO, READ BACK BY AND VERIFIED WITH: LHosie Poisson.D. 17:15 07/07/16 (wilsonm)    Streptococcus species NOT DETECTED NOT DETECTED Final   Streptococcus agalactiae NOT DETECTED NOT DETECTED Final   Streptococcus pneumoniae NOT DETECTED NOT DETECTED Final   Streptococcus pyogenes NOT DETECTED NOT DETECTED Final   Acinetobacter baumannii NOT DETECTED NOT DETECTED Final   Enterobacteriaceae species  NOT DETECTED NOT DETECTED Final   Enterobacter cloacae complex NOT DETECTED NOT DETECTED Final   Escherichia coli NOT DETECTED NOT DETECTED Final   Klebsiella oxytoca NOT DETECTED NOT DETECTED Final   Klebsiella pneumoniae NOT DETECTED NOT DETECTED Final   Proteus species NOT DETECTED NOT DETECTED Final   Serratia marcescens NOT DETECTED NOT DETECTED Final   Haemophilus influenzae NOT DETECTED NOT DETECTED Final   Neisseria meningitidis NOT DETECTED NOT DETECTED Final   Pseudomonas aeruginosa NOT DETECTED NOT DETECTED Final   Candida albicans NOT DETECTED NOT DETECTED Final   Candida glabrata NOT DETECTED NOT DETECTED Final  Candida krusei NOT DETECTED NOT DETECTED Final   Candida parapsilosis NOT DETECTED NOT DETECTED Final   Candida tropicalis NOT DETECTED NOT DETECTED Final    Comment: Performed at [facility]  Blood Culture (routine x 2)     Status: Abnormal   Collection Time: 07/07/16 12:50 AM  Result Value Ref Range Status   Specimen Description BLOOD BLOOD LEFT FOREARM  Final   Special Requests IN PEDIATRIC BOTTLE 3CC  Final   Culture  Setup Time   Final    GRAM POSITIVE COCCI IN CLUSTERS AEROBIC BOTTLE ONLY CRITICAL VALUE NOTED.  VALUE IS CONSISTENT WITH PREVIOUSLY REPORTED AND CALLED VALUE.    Culture (A)  Final    STAPHYLOCOCCUS AUREUS SUSCEPTIBILITIES PERFORMED ON PREVIOUS CULTURE WITHIN THE LAST 5 DAYS. Performed at [facility]    Report Status 07/09/2016 FINAL  Final  Urine culture     Status: None   Collection Time: 07/07/16  1:10 AM  Result Value Ref Range Status   Specimen Description URINE, CATHETERIZED  Final   Special Requests NONE  Final   Culture NO GROWTH Performed at [facility]   Final   Report Status 07/08/2016 FINAL  Final  MRSA PCR Screening     Status: Abnormal   Collection Time: 07/07/16  4:12 PM  Result Value Ref Range Status   MRSA by PCR POSITIVE (A) NEGATIVE Final    Comment:        The GeneXpert MRSA Assay  (FDA approved for NASAL specimens only), is one component of a comprehensive MRSA colonization surveillance program. It is not intended to diagnose MRSA infection nor to guide or monitor treatment for MRSA infections. RESULT CALLED TO, READ BACK BY AND VERIFIED WITH: Alverda Skeans RN 17:50 07/07/16 (wilsonm)   Respiratory Panel by PCR     Status: None   Collection Time: 07/08/16  6:52 AM  Result Value Ref Range Status   Adenovirus NOT DETECTED NOT DETECTED Final   Coronavirus 229E NOT DETECTED NOT DETECTED Final   Coronavirus HKU1 NOT DETECTED NOT DETECTED Final   Coronavirus NL63 NOT DETECTED NOT DETECTED Final   Coronavirus OC43 NOT DETECTED NOT DETECTED Final   Metapneumovirus NOT DETECTED NOT DETECTED Final   Rhinovirus / Enterovirus NOT DETECTED NOT DETECTED Final   Influenza A NOT DETECTED NOT DETECTED Final   Influenza B NOT DETECTED NOT DETECTED Final   Parainfluenza Virus 1 NOT DETECTED NOT DETECTED Final   Parainfluenza Virus 2 NOT DETECTED NOT DETECTED Final   Parainfluenza Virus 3 NOT DETECTED NOT DETECTED Final   Parainfluenza Virus 4 NOT DETECTED NOT DETECTED Final   Respiratory Syncytial Virus NOT DETECTED NOT DETECTED Final   Bordetella pertussis NOT DETECTED NOT DETECTED Final   Chlamydophila pneumoniae NOT DETECTED NOT DETECTED Final   Mycoplasma pneumoniae NOT DETECTED NOT DETECTED Final  Culture, blood (routine x 2)     Status: None (Preliminary result)   Collection Time: 07/08/16  9:50 AM  Result Value Ref Range Status   Specimen Description BLOOD RIGHT HAND  Final   Special Requests BOTTLES DRAWN AEROBIC ONLY 5CC  Final   Culture NO GROWTH 4 DAYS  Final   Report Status PENDING  Incomplete  Culture, blood (routine x 2)     Status: Abnormal   Collection Time: 07/08/16 10:00 AM  Result Value Ref Range Status   Specimen Description BLOOD LEFT HAND  Final   Special Requests IN PEDIATRIC BOTTLE [facility]  Final   Culture  Setup  Time   Final    IN PEDIATRIC  BOTTLE GRAM POSITIVE COCCI IN CLUSTERS CRITICAL RESULT CALLED TO, READ BACK BY AND VERIFIED WITH: J LEDFORD,PHARMD AT 0715 07/09/16 BY L BENFIELD    Culture METHICILLIN RESISTANT STAPHYLOCOCCUS AUREUS (A)  Final   Report Status 07/11/2016 FINAL  Final   Organism ID, Bacteria METHICILLIN RESISTANT STAPHYLOCOCCUS AUREUS  Final      Susceptibility   Methicillin resistant staphylococcus aureus - MIC*    CIPROFLOXACIN <=0.5 SENSITIVE Sensitive     ERYTHROMYCIN >=8 RESISTANT Resistant     GENTAMICIN <=0.5 SENSITIVE Sensitive     OXACILLIN >=4 RESISTANT Resistant     TETRACYCLINE <=1 SENSITIVE Sensitive     VANCOMYCIN 1 SENSITIVE Sensitive     TRIMETH/SULFA <=10 SENSITIVE Sensitive     CLINDAMYCIN <=0.25 SENSITIVE Sensitive     RIFAMPIN <=0.5 SENSITIVE Sensitive     Inducible Clindamycin NEGATIVE Sensitive     * METHICILLIN RESISTANT STAPHYLOCOCCUS AUREUS  Culture, blood (routine x 2)     Status: Abnormal   Collection Time: 07/09/16  6:24 PM  Result Value Ref Range Status   Specimen Description BLOOD LEFT FOREARM  Final   Special Requests IN PEDIATRIC BOTTLE 3CC  Final   Culture  Setup Time   Final    GRAM POSITIVE COCCI IN CLUSTERS IN PEDIATRIC BOTTLE CRITICAL RESULT CALLED TO, READ BACK BY AND VERIFIED WITH: ALaural Benes [facility] [id number]    Culture (A)  Final    STAPHYLOCOCCUS AUREUS SUSCEPTIBILITIES PERFORMED ON PREVIOUS CULTURE WITHIN THE LAST 5 DAYS.    Report Status 07/11/2016 FINAL  Final  Culture, blood (routine x 2)     Status: None (Preliminary result)   Collection Time: 07/09/16  6:25 PM  Result Value Ref Range Status   Specimen Description BLOOD LEFT HAND  Final   Special Requests BOTTLES DRAWN AEROBIC ONLY 6CC  Final   Culture NO GROWTH 3 DAYS  Final   Report Status PENDING  Incomplete  Culture, blood (routine x 2)     Status: None (Preliminary result)   Collection Time: 07/11/16 10:10 AM  Result Value Ref Range Status   Specimen Description BLOOD LEFT  ANTECUBITAL  Final   Special Requests BOTTLES DRAWN AEROBIC ONLY 5CC  Final   Culture NO GROWTH 1 DAY  Final   Report Status PENDING  Incomplete  Culture, blood (routine x 2)     Status: None (Preliminary result)   Collection Time: 07/11/16 10:15 AM  Result Value Ref Range Status   Specimen Description BLOOD LEFT ARM  Final   Special Requests BOTTLES DRAWN AEROBIC ONLY 5CC  Final   Culture NO GROWTH 1 DAY  Final   Report Status PENDING  Incomplete     ASSESSMENT: She continues to be febrile, most likely due to septic pulmonary emboli with early abscess formation. So far yesterday's blood cultures remain negative.  PLAN: 1. Continue vancomycin  [name], MD [facility] [phone] pager   [phone] cell 07/12/2016, 2:09 PM

## 2016-07-12 NOTE — Progress Notes (Signed)
Nutrition Follow-up  DOCUMENTATION CODES:   Not applicable  INTERVENTION:   -Continue Ensure Enlive po BID, each supplement provides 350 kcal and 20 grams of protein  NUTRITION DIAGNOSIS:   Inadequate oral intake related to  (decreased appetite) as evidenced by meal completion < 50%.  Progressing  GOAL:   Patient will meet greater than or equal to 90% of their needs  Progressing  MONITOR:   PO intake, Supplement acceptance  REASON FOR ASSESSMENT:   Malnutrition Screening Tool    ASSESSMENT:   Pt with hx of IV drug abuse, ETOH, hepatitis C, who was released from jail 1/4 admitted with sepsis, bilateral cavitary PNA, MRSA bacteremia and probable tricuspid valve endocarditis with septic pulmonary emboli.   Per ID note from 07/09/16, TEE revealed vegetations on tricuspid valve.   Spoke with pt at pt father at bedside. Both report appetite has improved greatly since admission; meal completion 50-90%. Per pt, she ate all of her breakfast other than her cereal this AM. Pt father shares that pt consumed a high sugar diet PTA and loves sweets. She really enjoys the Ensure supplements and prefers the chocolate flavor.   Case discussed with RN, who reports pt is eating better and is more alert. Pt was just transferred to chair prior to RD visit.   Medications reviewed and include MBI, folvite, and vitamin B-1.   Labs reviewed: Na: 134.  Diet Order:  Diet regular Room service appropriate? Yes; Fluid consistency: Thin  Skin:  Reviewed, no issues  Last BM:  07/08/16  Height:   Ht Readings from Last 1 Encounters:  07/07/16 5\' 2"  (1.575 m)    Weight:   Wt Readings from Last 1 Encounters:  07/12/16 131 lb 1.6 oz (59.5 kg)    Ideal Body Weight:  50 kg  BMI:  Body mass index is 23.98 kg/m.  Estimated Nutritional Needs:   Kcal:  1600-1800  Protein:  80-90 grams  Fluid:  > 1.6 L/day  EDUCATION NEEDS:   No education needs identified at this time  Anna Theissen A.  Mayford Colon, RD, LDN, CDE Pager: [phone] After hours Pager: [phone]

## 2016-07-12 NOTE — Progress Notes (Signed)
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 today. EXAM: PORTABLE CHEST 1 VIEW COMPARISON:  CT chest and PA and lateral chest 07/07/2014. FINDINGS: Multiple nodular opacities bilaterally again seen. There is increased bibasilar airspace disease. No pneumothorax. Small pleural effusions noted. Heart size is normal. IMPRESSION: Increased bibasilar airspace disease could be due to atelectasis, aspiration and/or pneumonia. Multifocal nodular opacities compatible with septic emboli as seen on chest CT yesterday. Electronically Signed   By: Drusilla Kanner M.D.   On: 07/08/2016 12:07    Time Spent in minutes 35   Eddie North M.D on 07/12/2016 at 8:51 AM  Between 7am to 7pm - Pager - [phone]  After 7pm go to [email or web address] - password [facility]  [facility] -  Office   [phone]

## 2016-07-12 NOTE — Progress Notes (Signed)
Pt noted to have reddened area to lower back and buttocks. C/o itching. Craige CottaKirby NP notified

## 2016-07-13 ENCOUNTER — Inpatient Hospital Stay (HOSPITAL_COMMUNITY): Payer: Self-pay

## 2016-07-13 DIAGNOSIS — M549 Dorsalgia, unspecified: Secondary | ICD-10-CM

## 2016-07-13 DIAGNOSIS — I2690 Septic pulmonary embolism without acute cor pulmonale: Secondary | ICD-10-CM

## 2016-07-13 DIAGNOSIS — A4102 Sepsis due to Methicillin resistant Staphylococcus aureus: Secondary | ICD-10-CM

## 2016-07-13 LAB — BASIC METABOLIC PANEL
ANION GAP: 7 (ref 5–15)
BUN: 8 mg/dL (ref 6–20)
CO2: 26 mmol/L (ref 22–32)
Calcium: 8 mg/dL — ABNORMAL LOW (ref 8.9–10.3)
Chloride: 102 mmol/L (ref 101–111)
Creatinine, Ser: 0.65 mg/dL (ref 0.44–1.00)
GFR calc Af Amer: >60 mL/min (ref >60)
GLUCOSE: 99 mg/dL (ref 65–99)
POTASSIUM: 4.1 mmol/L (ref 3.5–5.1)
Sodium: 135 mmol/L (ref 135–145)

## 2016-07-13 LAB — CULTURE, BLOOD (ROUTINE X 2): Culture: NO GROWTH

## 2016-07-13 MED ORDER — HYDROMORPHONE HCL 1 MG/ML IJ SOLN
1.0000 mg | Freq: Once | INTRAMUSCULAR | Status: AC
  Administered 2016-07-13: 1 mg via INTRAVENOUS
  Filled 2016-07-13: qty 1

## 2016-07-13 MED ORDER — HYDROMORPHONE HCL 1 MG/ML IJ SOLN
2.0000 mg | INTRAMUSCULAR | Status: DC | PRN
  Administered 2016-07-13 – 2016-07-15 (×10): 2 mg via INTRAVENOUS
  Filled 2016-07-13 (×10): qty 2

## 2016-07-13 MED ORDER — FERROUS SULFATE 325 (65 FE) MG PO TABS
325.0000 mg | ORAL_TABLET | Freq: Two times a day (BID) | ORAL | Status: DC
  Administered 2016-07-13 – 2016-08-08 (×50): 325 mg via ORAL
  Filled 2016-07-13 (×49): qty 1

## 2016-07-13 MED ORDER — KETOROLAC TROMETHAMINE 15 MG/ML IJ SOLN
30.0000 mg | Freq: Four times a day (QID) | INTRAMUSCULAR | Status: DC | PRN
  Administered 2016-07-13: 30 mg via INTRAVENOUS
  Filled 2016-07-13 (×2): qty 2

## 2016-07-13 MED ORDER — KETOROLAC TROMETHAMINE 30 MG/ML IJ SOLN
30.0000 mg | Freq: Four times a day (QID) | INTRAMUSCULAR | Status: DC | PRN
Start: 2016-07-13 — End: 2016-07-16
  Administered 2016-07-14 – 2016-07-16 (×6): 30 mg via INTRAVENOUS
  Filled 2016-07-13 (×6): qty 1

## 2016-07-13 NOTE — Progress Notes (Signed)
[facility]  Date of Admission:  07/07/2016           Day 7 vancomycin  Principal Problem:   MRSA bacteremia Active Problems:   Cavitary pneumonia   Endocarditis of tricuspid valve   Sepsis (HCC)   IVDU (intravenous drug user)   Cigarette smoker   ETOH abuse   Hepatitis C antibody test positive   Normocytic anemia   Thrombocytopenia (HCC)   . enoxaparin (LOVENOX) injection  40 mg Subcutaneous Q24H  . feeding supplement (ENSURE ENLIVE)  237 mL Oral BID BM  . folic acid  1 mg Oral Daily  . LORazepam  0-4 mg Intravenous Q12H  . multivitamin with minerals  1 tablet Oral Daily  . nicotine  21 mg Transdermal Daily  . sodium chloride flush  10-40 mL Intracatheter Q12H  . thiamine  100 mg Oral Daily   Or  . thiamine  100 mg Intravenous Daily  . vancomycin  1,000 mg Intravenous Q8H    SUBJECTIVE: She continues complaining of pain that is worse when she coughs. Her pain is bilateral and mostly in her back. Her cough is nonproductive.  Review of Systems: Review of Systems  Constitutional: Positive for fever and malaise/fatigue. Negative for chills and diaphoresis.  Respiratory: Positive for cough. Negative for sputum production and shortness of breath.   Cardiovascular: Positive for chest pain.  Gastrointestinal: Negative for abdominal pain, diarrhea, nausea and vomiting.  Musculoskeletal: Positive for back pain.  Neurological: Negative for headaches.  Psychiatric/Behavioral: Positive for substance abuse.    Past Medical History:  Diagnosis Date  . Asthma   . ETOH abuse   . Hepatitis C   . IVDU (intravenous drug user)     Social History  Substance Use Topics  . Smoking status: Current Every Day Smoker    Packs/day: 1.00  . Smokeless tobacco: Never Used  . Alcohol use No    History reviewed. No pertinent family history. No Known Allergies  OBJECTIVE: Vitals:   07/13/16 0359 07/13/16 0400 07/13/16 0500 07/13/16 0839  BP:  (!) 130/91 (!) 130/91 (!) 137/97 (!) 142/109  Pulse: (!) 111 (!) 111 (!) 106 (!) 125  Resp: (!) 22 (!) 23 (!) 24 (!) 34  Temp: (!) 100.5 F (38.1 C)   (!) 102.9 F (39.4 C)  TempSrc: Oral   Oral  SpO2: 96% 96% 95% 97%  Weight: 131 lb (59.4 kg)     Height:       Body mass index is 23.96 kg/m.  Physical Exam  Constitutional: She is oriented to person, place, and time.  She is more alert and interactive. She is upset because she has not gotten pain medication recently.  HENT:  Mouth/Throat: No oropharyngeal exudate.  Cardiovascular: Regular rhythm.   No murmur heard. She remains tachycardic.  Pulmonary/Chest: Effort normal. She has no wheezes. She has rales.  Abdominal: Soft. There is no tenderness.  Musculoskeletal: Normal range of motion. She exhibits no edema or tenderness.  Neurological: She is alert and oriented to person, place, and time.  Skin: No rash noted.    Lab Results Lab Results  Component Value Date   WBC 11.2 (H) 07/12/2016   HGB 8.7 (L) 07/12/2016   HCT 26.9 (L) 07/12/2016   MCV 81.3 07/12/2016   PLT 224 07/12/2016    Lab Results  Component Value Date   CREATININE 0.65 07/13/2016   BUN 8 07/13/2016   NA 135 07/13/2016  K 4.1 07/13/2016   CL 102 07/13/2016   CO2 26 07/13/2016    Lab Results  Component Value Date   ALT 19 07/08/2016   AST 31 07/08/2016   ALKPHOS 93 07/08/2016   BILITOT 0.5 07/08/2016     Microbiology: Recent Results (from the past 240 hour(s))  Blood Culture (routine x 2)     Status: Abnormal   Collection Time: 07/07/16 12:40 AM  Result Value Ref Range Status   Specimen Description BLOOD BLOOD RIGHT FOREARM  Final   Special Requests BOTTLES DRAWN AEROBIC AND ANAEROBIC 5CC EACH  Final   Culture  Setup Time   Final    GRAM POSITIVE COCCI IN CLUSTERS IN BOTH AEROBIC AND ANAEROBIC BOTTLES CRITICAL RESULT CALLED TO, READ BACK BY AND VERIFIED WITH: LHosie Poisson.D. 17:15 07/07/16  ([email or web address]) Performed at [facility]     Culture METHICILLIN RESISTANT STAPHYLOCOCCUS AUREUS (A)  Final   Report Status 07/09/2016 FINAL  Final   Organism ID, Bacteria METHICILLIN RESISTANT STAPHYLOCOCCUS AUREUS  Final      Susceptibility   Methicillin resistant staphylococcus aureus - MIC*    CIPROFLOXACIN <=0.5 SENSITIVE Sensitive     ERYTHROMYCIN >=8 RESISTANT Resistant     GENTAMICIN <=0.5 SENSITIVE Sensitive     OXACILLIN >=4 RESISTANT Resistant     TETRACYCLINE <=1 SENSITIVE Sensitive     VANCOMYCIN 1 SENSITIVE Sensitive     TRIMETH/SULFA <=10 SENSITIVE Sensitive     CLINDAMYCIN <=0.25 SENSITIVE Sensitive     RIFAMPIN <=0.5 SENSITIVE Sensitive     Inducible Clindamycin NEGATIVE Sensitive     * METHICILLIN RESISTANT STAPHYLOCOCCUS AUREUS  Blood Culture ID Panel (Reflexed)     Status: Abnormal   Collection Time: 07/07/16 12:40 AM  Result Value Ref Range Status   Enterococcus species NOT DETECTED NOT DETECTED Final   Listeria monocytogenes NOT DETECTED NOT DETECTED Final   Staphylococcus species DETECTED (A) NOT DETECTED Final    Comment: CRITICAL RESULT CALLED TO, READ BACK BY AND VERIFIED WITH: LHosie Poisson.D. 17:15 07/07/16 ([email or web address])    Staphylococcus aureus DETECTED (A) NOT DETECTED Final    Comment: CRITICAL RESULT CALLED TO, READ BACK BY AND VERIFIED WITH: LHosie Poisson.D. 17:15 07/07/16 (wilsonm)    Methicillin resistance DETECTED (A) NOT DETECTED Final    Comment: CRITICAL RESULT CALLED TO, READ BACK BY AND VERIFIED WITH: LHosie Poisson.D. 17:15 07/07/16 (wilsonm)    Streptococcus species NOT DETECTED NOT DETECTED Final   Streptococcus agalactiae NOT DETECTED NOT DETECTED Final   Streptococcus pneumoniae NOT DETECTED NOT DETECTED Final   Streptococcus pyogenes NOT DETECTED NOT DETECTED Final   Acinetobacter baumannii NOT DETECTED NOT DETECTED Final   Enterobacteriaceae species NOT DETECTED NOT DETECTED Final   Enterobacter cloacae complex NOT DETECTED NOT DETECTED Final   Escherichia coli NOT DETECTED NOT  DETECTED Final   Klebsiella oxytoca NOT DETECTED NOT DETECTED Final   Klebsiella pneumoniae NOT DETECTED NOT DETECTED Final   Proteus species NOT DETECTED NOT DETECTED Final   Serratia marcescens NOT DETECTED NOT DETECTED Final   Haemophilus influenzae NOT DETECTED NOT DETECTED Final   Neisseria meningitidis NOT DETECTED NOT DETECTED Final   Pseudomonas aeruginosa NOT DETECTED NOT DETECTED Final   Candida albicans NOT DETECTED NOT DETECTED Final   Candida glabrata NOT DETECTED NOT DETECTED Final   Candida krusei NOT DETECTED NOT DETECTED Final   Candida parapsilosis NOT DETECTED NOT DETECTED Final   Candida tropicalis NOT DETECTED NOT DETECTED Final    Comment: Performed  at [facility]  Blood Culture (routine x 2)     Status: Abnormal   Collection Time: 07/07/16 12:50 AM  Result Value Ref Range Status   Specimen Description BLOOD BLOOD LEFT FOREARM  Final   Special Requests IN PEDIATRIC BOTTLE 3CC  Final   Culture  Setup Time   Final    GRAM POSITIVE COCCI IN CLUSTERS AEROBIC BOTTLE ONLY CRITICAL VALUE NOTED.  VALUE IS CONSISTENT WITH PREVIOUSLY REPORTED AND CALLED VALUE.    Culture (A)  Final    STAPHYLOCOCCUS AUREUS SUSCEPTIBILITIES PERFORMED ON PREVIOUS CULTURE WITHIN THE LAST 5 DAYS. Performed at [facility]    Report Status 07/09/2016 FINAL  Final  Urine culture     Status: None   Collection Time: 07/07/16  1:10 AM  Result Value Ref Range Status   Specimen Description URINE, CATHETERIZED  Final   Special Requests NONE  Final   Culture NO GROWTH Performed at [facility]   Final   Report Status 07/08/2016 FINAL  Final  MRSA PCR Screening     Status: Abnormal   Collection Time: 07/07/16  4:12 PM  Result Value Ref Range Status   MRSA by PCR POSITIVE (A) NEGATIVE Final    Comment:        The GeneXpert MRSA Assay (FDA approved for NASAL specimens only), is one component of a comprehensive MRSA colonization surveillance program. It is  not intended to diagnose MRSA infection nor to guide or monitor treatment for MRSA infections. RESULT CALLED TO, READ BACK BY AND VERIFIED WITH: Alverda Skeans RN 17:50 07/07/16 ([email or web address])   Respiratory Panel by PCR     Status: None   Collection Time: 07/08/16  6:52 AM  Result Value Ref Range Status   Adenovirus NOT DETECTED NOT DETECTED Final   Coronavirus 229E NOT DETECTED NOT DETECTED Final   Coronavirus HKU1 NOT DETECTED NOT DETECTED Final   Coronavirus NL63 NOT DETECTED NOT DETECTED Final   Coronavirus OC43 NOT DETECTED NOT DETECTED Final   Metapneumovirus NOT DETECTED NOT DETECTED Final   Rhinovirus / Enterovirus NOT DETECTED NOT DETECTED Final   Influenza A NOT DETECTED NOT DETECTED Final   Influenza B NOT DETECTED NOT DETECTED Final   Parainfluenza Virus 1 NOT DETECTED NOT DETECTED Final   Parainfluenza Virus 2 NOT DETECTED NOT DETECTED Final   Parainfluenza Virus 3 NOT DETECTED NOT DETECTED Final   Parainfluenza Virus 4 NOT DETECTED NOT DETECTED Final   Respiratory Syncytial Virus NOT DETECTED NOT DETECTED Final   Bordetella pertussis NOT DETECTED NOT DETECTED Final   Chlamydophila pneumoniae NOT DETECTED NOT DETECTED Final   Mycoplasma pneumoniae NOT DETECTED NOT DETECTED Final  Culture, blood (routine x 2)     Status: None (Preliminary result)   Collection Time: 07/08/16  9:50 AM  Result Value Ref Range Status   Specimen Description BLOOD RIGHT HAND  Final   Special Requests BOTTLES DRAWN AEROBIC ONLY 5CC  Final   Culture NO GROWTH 4 DAYS  Final   Report Status PENDING  Incomplete  Culture, blood (routine x 2)     Status: Abnormal   Collection Time: 07/08/16 10:00 AM  Result Value Ref Range Status   Specimen Description BLOOD LEFT HAND  Final   Special Requests IN PEDIATRIC BOTTLE 2CC  Final   Culture  Setup Time   Final    IN PEDIATRIC BOTTLE GRAM POSITIVE COCCI IN CLUSTERS CRITICAL RESULT CALLED TO, READ BACK BY AND VERIFIED WITH: J LEDFORD,PHARMD AT 1610 07/09/16  BY L  BENFIELD    Culture METHICILLIN RESISTANT STAPHYLOCOCCUS AUREUS (A)  Final   Report Status 07/11/2016 FINAL  Final   Organism ID, Bacteria METHICILLIN RESISTANT STAPHYLOCOCCUS AUREUS  Final      Susceptibility   Methicillin resistant staphylococcus aureus - MIC*    CIPROFLOXACIN <=0.5 SENSITIVE Sensitive     ERYTHROMYCIN >=8 RESISTANT Resistant     GENTAMICIN <=0.5 SENSITIVE Sensitive     OXACILLIN >=4 RESISTANT Resistant     TETRACYCLINE <=1 SENSITIVE Sensitive     VANCOMYCIN 1 SENSITIVE Sensitive     TRIMETH/SULFA <=10 SENSITIVE Sensitive     CLINDAMYCIN <=0.25 SENSITIVE Sensitive     RIFAMPIN <=0.5 SENSITIVE Sensitive     Inducible Clindamycin NEGATIVE Sensitive     * METHICILLIN RESISTANT STAPHYLOCOCCUS AUREUS  Culture, blood (routine x 2)     Status: Abnormal   Collection Time: 07/09/16  6:24 PM  Result Value Ref Range Status   Specimen Description BLOOD LEFT FOREARM  Final   Special Requests IN PEDIATRIC BOTTLE 3CC  Final   Culture  Setup Time   Final    GRAM POSITIVE COCCI IN CLUSTERS IN PEDIATRIC BOTTLE CRITICAL RESULT CALLED TO, READ BACK BY AND VERIFIED WITH: ALaural Benes [facility] [id number]    Culture (A)  Final    STAPHYLOCOCCUS AUREUS SUSCEPTIBILITIES PERFORMED ON PREVIOUS CULTURE WITHIN THE LAST 5 DAYS.    Report Status 07/11/2016 FINAL  Final  Culture, blood (routine x 2)     Status: None (Preliminary result)   Collection Time: 07/09/16  6:25 PM  Result Value Ref Range Status   Specimen Description BLOOD LEFT HAND  Final   Special Requests BOTTLES DRAWN AEROBIC ONLY 6CC  Final   Culture NO GROWTH 3 DAYS  Final   Report Status PENDING  Incomplete  Culture, blood (routine x 2)     Status: None (Preliminary result)   Collection Time: 07/11/16 10:10 AM  Result Value Ref Range Status   Specimen Description BLOOD LEFT ANTECUBITAL  Final   Special Requests BOTTLES DRAWN AEROBIC ONLY 5CC  Final   Culture NO GROWTH 1 DAY  Final   Report Status PENDING   Incomplete  Culture, blood (routine x 2)     Status: None (Preliminary result)   Collection Time: 07/11/16 10:15 AM  Result Value Ref Range Status   Specimen Description BLOOD LEFT ARM  Final   Special Requests BOTTLES DRAWN AEROBIC ONLY 5CC  Final   Culture NO GROWTH 1 DAY  Final   Report Status PENDING  Incomplete     ASSESSMENT: Her latest blood cultures remain negative at 48 hours. She seems to be making some slow improvement but remains febrile. I will repeat a chest x-ray to look for evidence of evolving lung abscess and/or empyema.  PLAN: 1. Continue vancomycin 2. Repeat chest x-ray  [name], MD [facility] [phone] pager   [phone] cell 07/13/2016, 10:55 AM

## 2016-07-13 NOTE — Progress Notes (Signed)
On rounding, RN noted the reddened area previously noted to patients sacrum is now a stage II, area is CDI. Patient removed foam dressing due to itching and declined the reapplication of one. Pt also declines turning and repositioning due to back/rib/chest pain. WOC consult placed. Will continue to monitor.

## 2016-07-13 NOTE — Progress Notes (Signed)
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 admitted yesterday with MR SA bacteremia. Possible aspiration today. EXAM: PORTABLE CHEST 1 VIEW COMPARISON:  CT chest and PA and lateral chest 07/07/2014. FINDINGS: Multiple nodular opacities bilaterally again seen. There is increased bibasilar airspace disease. No pneumothorax. Small pleural effusions noted. Heart size is normal. IMPRESSION: Increased bibasilar airspace disease could be due to atelectasis, aspiration and/or pneumonia. Multifocal nodular opacities compatible with septic emboli as seen on chest CT yesterday. Electronically Signed   By: Drusilla Kanner M.D.   On: 07/08/2016 12:07    Time Spent in minutes 35   Eddie North M.D on 07/13/2016 at 11:48 AM  Between 7am to 7pm - Pager -  [phone]  After 7pm go to [email or web address] - password [facility]  [facility] -  Office  [phone]

## 2016-07-13 NOTE — Progress Notes (Signed)
Patient offered a bath. States she would prefer to shower. Safety education provided, pt aware she is too weak to shower and that she cannot come off telemetry monitoring. Offered bed bath again, pt declined saying "I know I smell bad but I don't want to be bathed." pt also stated she was too weak to bathe herself. Father present for exchange and aware. Patient did consent to changing her gown. Will offer again later this shift.

## 2016-07-13 NOTE — Progress Notes (Signed)
Pharmacy Antibiotic Note  Anna Colon is a 36 y.o. female with history of IV drug abuse released from jail 07/06/16 who presents with nausea, vomiting, and fever (tmax 104), and chills admitted on 07/07/2016 with sepsis.  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  Vancomycin trough at goal on vancomycin 1000 mg IV q 8 hrs.  Plan: Continue vancomycin to 1g IV q 8 hrs Will continue to monitor renal function, and vancomycin trough again in the next day or two.  Height: 5\' 2"  (157.5 cm) Weight: 131 lb (59.4 kg) IBW/kg (Calculated) : 50.1  Temp (24hrs), Avg:99 F (37.2 C), Min:98.1 F (36.7 C), Max:100.5 F (38.1 C)   Recent Labs Lab 07/07/16 0038 07/07/16 0040 07/07/16 0329 07/07/16 1003  07/07/16 1821 07/07/16 1837 07/07/16 2127 07/08/16 0234 07/08/16 1357 07/10/16 0942 07/10/16 1318 07/12/16 0347 07/13/16 0315  WBC  --  19.6*  --   --   --   --  13.5*  --  10.7*  --  11.6*  --  11.2*  --   CREATININE  --  1.10*  --   --   < >  --  0.84  --  0.65  --  0.66  --  0.66 0.65  LATICACIDVEN 3.72*  --  0.76 1.42  --  3.3*  --  2.6*  --   --   --   --   --   --   VANCOTROUGH  --   --   --   --   --   --   --   --   --  15  --  18  --   --   < > = values in this interval not displayed.  Estimated Creatinine Clearance: 76.2 mL/min (by C-G formula based on SCr of 0.65 mg/dL).    No Known Allergies   Antimicrobials this admission:  Vanc 1/5 >>  Zosyn 1/5 >> 1/5  Dose adjustments this admission:  1/6 VT = 15 - on vanc 750 mg IV q 8 hrs 1/8 VT = 18 - on vanc 1g IV q 8 hrs  Microbiology results:  1/5 BCx: Staph aureus **1/5 BCID - MRSA 1/5 UCx: neg 1/5 MRSA PCR: (+) 1/6 resp panel pending 1/6 BCx x 2 > 1/2 MRSA 1/7 BCx x 2 > 1/2 staph aureus 1/9  Bcx ngtd 1/6 HCV Ab > 11, viral load not calc- spontaneous clearing per ID  Thank you for allowing pharmacy to be a part of this patient's care.  Tad MooreJessica Ramesses Crampton, Pharm D, BCPS  Clinical  Pharmacist Pager [phone]  07/13/2016 8:08 AM

## 2016-07-13 NOTE — Clinical Social Work Note (Signed)
Clinical Social Work Assessment  Patient Details  Name: Anna CaterConstance Hillery MRN: [id number] Date of Birth: April 05, 1981  Date of referral:  07/13/16               Reason for consult:  Facility Placement, Substance Use/ETOH Abuse                Permission sought to share information with:  Facility Medical sales representativeContact Representative, Family Supports Permission granted to share information::  Yes, Verbal Permission Granted  Name::     Nurse, learning disabilityJeffrey  Agency::  [facility]  Relationship::  Father  Contact Information:  [phone]  Housing/Transportation Living arrangements for the past 2 months:  Single Family Home Source of Information:  Patient Patient Interpreter Needed:  None Criminal Activity/Legal Involvement Pertinent to Current Situation/Hospitalization:  No - Comment as needed Significant Relationships:  Parents Lives with:  Parents, Self Do you feel safe going back to the place where you live?  No Need for family participation in patient care:  No (Coment)  Care giving concerns:  CSW received consult regarding substance use and the need for snf due to the patient requiring long term IV antibiotics. Patient has a history of IV drug use. CSW spoke with patient. Patient stated she was in pain (MD aware). CSW offered patient substance use resouces, but patient refused them. CSW also introduced snf for discharge planning. Patient requested to talk about it later when she wasn't in pain. CSW to continue to follow for discharge needs.    Social Worker assessment / plan:  CSW spoke with patient concerning [facility] before returning home as well as substance use.  Employment status:  Other (Comment) Insurance information:  Self Pay (Medicaid Pending) PT Recommendations:  Not assessed at this time Information / Referral to community resources:  Skilled Nursing Facility  Patient/Family's Response to care:  Patient reported she was in pain and requested CSW come back to discuss snf options.    Patient/Family's Understanding of and Emotional Response to Diagnosis, Current Treatment, and Prognosis:  No questions or concerns raised at this time, but snf barriers include lack of insurance and IV drug use history.  Emotional Assessment Appearance:  Appears stated age Attitude/Demeanor/Rapport:  Other (Moaning in pain) Affect (typically observed):  Tearful/Crying Orientation:  Oriented to Self, Oriented to Situation, Oriented to Place, Oriented to  Time Alcohol / Substance use:  Illicit Drugs Psych involvement (Current and /or in the community):  No (Comment)  Discharge Needs  Concerns to be addressed:  Care Coordination, Substance Abuse Concerns Readmission within the last 30 days:  No Current discharge risk:  Substance Abuse Barriers to Discharge:  Continued Medical Work up   Ingram Micro Incadia S Jacolby Risby, LCSWA 07/13/2016, 10:27 AM

## 2016-07-13 NOTE — [place] FL2 (Signed)
[place] MEDICAID FL2 LEVEL OF CARE SCREENING TOOL     IDENTIFICATION  Patient Name: Anna Colon Birthdate: 03-Jul-1981 Sex: female Admission Date (Current Location): 07/07/2016  [facility] [place]   Facility and Address:  [facility], [address], [place], [place] [address]      Provider Number: [id number]  Attending Physician Name and Address:  Eddie NorthNishant Dhungel, MD  Relative Name and Phone Number:  Tinnie GensJeffrey, father, [phone]    Current Level of Care: Hospital Recommended Level of Care: Skilled Nursing Facility Prior Approval Number:    Date Approved/Denied:   PASRR Number: [id number]  Discharge Plan: SNF    Current Diagnoses: Patient Active Problem List   Diagnosis Date Noted  . 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 Hepatitis C antibody test positive 07/08/2016  . Normocytic anemia 07/08/2016  . Thrombocytopenia (HCC) 07/08/2016  . Sepsis (HCC) 07/07/2016  . MRSA bacteremia 07/07/2016  . Cavitary pneumonia 07/07/2016  . IVDU (intravenous drug user) 07/07/2016  . Cigarette smoker 07/07/2016  . ETOH abuse 07/07/2016    Orientation RESPIRATION BLADDER Height & Weight     Self, Time, Situation, Place  O2 (Nasal cannula 3L) Continent Weight: 59.4 kg (131 lb) Height:  5\' 2"  (157.5 cm)  BEHAVIORAL SYMPTOMS/MOOD NEUROLOGICAL BOWEL NUTRITION STATUS      Continent Diet (Please see DC Summary)  AMBULATORY STATUS COMMUNICATION OF NEEDS Skin   Independent Verbally Normal                       Personal Care Assistance Level of Assistance  Bathing, Feeding, Dressing Bathing Assistance: Independent Feeding assistance: Independent Dressing Assistance: Independent     Functional Limitations Info             SPECIAL CARE FACTORS FREQUENCY                       Contractures      Additional Factors Info  Code Status, Allergies, Isolation Precautions Code Status Info: Full Allergies  Info: NKA     Isolation Precautions Info: MRSA     Current Medications (07/13/2016):  This is the current hospital active medication list Current Facility-Administered Medications  Medication Dose Route Frequency Provider Last Rate Last Dose  . 0.9 %  sodium chloride infusion   Intravenous Continuous Nishant Dhungel, MD 100 mL/hr at 07/13/16 0316    . acetaminophen (TYLENOL) tablet 650 mg  650 mg Oral Q6H PRN Haydee SalterPhillip M Hobbs, MD   650 mg at 07/13/16 [id number]   Or  . acetaminophen (TYLENOL) suppository 650 mg  650 mg Rectal Q6H PRN Haydee SalterPhillip M Hobbs, MD      . albuterol (PROVENTIL) (2.5 MG/3ML) 0.083% nebulizer solution 2.5 mg  2.5 mg Nebulization Q6H PRN Nishant Dhungel, MD   2.5 mg at 07/12/16 2153  . diphenhydrAMINE (BENADRYL) capsule 25 mg  25 mg Oral Q6H PRN Leda GauzeKaren J Kirby-Graham, NP   25 mg at 07/13/16 0311  . enoxaparin (LOVENOX) injection 40 mg  40 mg Subcutaneous Q24H Haydee SalterPhillip M Hobbs, MD   40 mg at 07/12/16 2211  . feeding supplement (ENSURE ENLIVE) (ENSURE ENLIVE) liquid 237 mL  237 mL Oral BID BM Nishant Dhungel, MD   237 mL at 07/12/16 1412  . folic acid (FOLVITE) tablet 1 mg  1 mg Oral Daily Haydee SalterPhillip M Hobbs, MD   1 mg at 07/13/16 [id number]  . hydrALAZINE (APRESOLINE) injection 10 mg  10 mg  Intravenous Q8H PRN Haydee Salter, MD      . HYDROmorphone (DILAUDID) injection 2 mg  2 mg Intravenous Q4H PRN Nishant Dhungel, MD      . ibuprofen (ADVIL,MOTRIN) tablet 400 mg  400 mg Oral Q6H PRN Penny Pia, MD   400 mg at 07/12/16 1208  . ketorolac (TORADOL) 30 MG/ML injection 30 mg  30 mg Intravenous Q6H PRN Gerhard Munch Hammons, RPH      . LORazepam (ATIVAN) injection 0-4 mg  0-4 mg Intravenous Q12H Leda Gauze, NP      . LORazepam (ATIVAN) tablet 1 mg  1 mg Oral Q6H PRN Leda Gauze, NP       Or  . LORazepam (ATIVAN) injection 1 mg  1 mg Intravenous Q6H PRN Leda Gauze, NP   1 mg at 07/13/16 1610  . multivitamin with minerals tablet 1 tablet  1 tablet Oral Daily Haydee Salter, MD   1 tablet at 07/13/16 [phone]  . nicotine (NICODERM CQ - dosed in mg/24 hours) patch 21 mg  21 mg Transdermal Daily Nishant Dhungel, MD   21 mg at 07/13/16 0922  . ondansetron (ZOFRAN) tablet 4 mg  4 mg Oral Q6H PRN Haydee Salter, MD       Or  . ondansetron [facility]) injection 4 mg  4 mg Intravenous Q6H PRN Haydee Salter, MD      . oxyCODONE (Oxy IR/ROXICODONE) immediate release tablet 5 mg  5 mg Oral Q4H PRN Haydee Salter, MD   5 mg at 07/13/16 0310  . polyethylene glycol (MIRALAX / GLYCOLAX) packet 17 g  17 g Oral Daily PRN Haydee Salter, MD      . sodium chloride flush (NS) 0.9 % injection 10-40 mL  10-40 mL Intracatheter Q12H Penny Pia, MD   10 mL at 07/12/16 2200  . sodium chloride flush (NS) 0.9 % injection 10-40 mL  10-40 mL Intracatheter PRN Penny Pia, MD      . thiamine (VITAMIN B-1) tablet 100 mg  100 mg Oral Daily Haydee Salter, MD   100 mg at 07/13/16 5409   Or  . thiamine (B-1) injection 100 mg  100 mg Intravenous Daily Haydee Salter, MD   100 mg at 07/08/16 0954  . traZODone (DESYREL) tablet 50 mg  50 mg Oral QHS PRN Haydee Salter, MD   50 mg at 07/08/16 2110  . vancomycin (VANCOCIN) IVPB 1000 mg/200 mL premix  1,000 mg Intravenous Q8H Gwenlyn Found Carney, RPH   1,000 mg at 07/13/16 8119     Discharge Medications: Please see discharge summary for a list of discharge medications.  Relevant Imaging Results:  Relevant Lab Results:   Additional Information SSN: [id number]       History of IV drug use  Mearl Latin, LCSWA

## 2016-07-14 DIAGNOSIS — R079 Chest pain, unspecified: Secondary | ICD-10-CM

## 2016-07-14 DIAGNOSIS — L8990 Pressure ulcer of unspecified site, unspecified stage: Secondary | ICD-10-CM | POA: Insufficient documentation

## 2016-07-14 LAB — CBC
HCT: 26.3 % — ABNORMAL LOW (ref 36.0–46.0)
Hemoglobin: 8.5 g/dL — ABNORMAL LOW (ref 12.0–15.0)
MCH: 26.8 pg (ref 26.0–34.0)
MCHC: 32.3 g/dL (ref 30.0–36.0)
MCV: 83 fL (ref 78.0–100.0)
PLATELETS: 305 10*3/uL (ref 150–400)
RBC: 3.17 MIL/uL — ABNORMAL LOW (ref 3.87–5.11)
RDW: 15.9 % — AB (ref 11.5–15.5)
WBC: 16.2 10*3/uL — AB (ref 4.0–10.5)

## 2016-07-14 LAB — CREATININE, SERUM: CREATININE: 0.57 mg/dL (ref 0.44–1.00)

## 2016-07-14 LAB — LACTIC ACID, PLASMA
LACTIC ACID, VENOUS: 1.8 mmol/L (ref 0.5–1.9)
Lactic Acid, Venous: 1.1 mmol/L (ref 0.5–1.9)

## 2016-07-14 LAB — VANCOMYCIN, TROUGH: Vancomycin Tr: 24 ug/mL (ref 15–20)

## 2016-07-14 MED ORDER — CLONIDINE HCL 0.1 MG PO TABS
0.1000 mg | ORAL_TABLET | Freq: Two times a day (BID) | ORAL | Status: DC
  Administered 2016-07-14 – 2016-08-08 (×48): 0.1 mg via ORAL
  Filled 2016-07-14 (×50): qty 1

## 2016-07-14 NOTE — Progress Notes (Signed)
Patient ID: Anna Colon, female   DOB: June 26, 1981, 36 y.o.   MRN: [id number]          [facility]  Date of Admission:  07/07/2016           Day 8 vancomycin  Principal Problem:   MRSA bacteremia Active Problems:   Cavitary pneumonia   Endocarditis of tricuspid valve   Sepsis (HCC)   IVDU (intravenous drug user)   Cigarette smoker   ETOH abuse   Hepatitis C antibody test positive   Normocytic anemia   Thrombocytopenia (HCC)   Acute septic pulmonary embolism without acute cor pulmonale (HCC)   Sepsis due to methicillin resistant Staphylococcus aureus (MRSA) (HCC)   Pressure injury of skin   . cloNIDine  0.1 mg Oral BID  . enoxaparin (LOVENOX) injection  40 mg Subcutaneous Q24H  . feeding supplement (ENSURE ENLIVE)  237 mL Oral BID BM  . ferrous sulfate  325 mg Oral BID WC  . folic acid  1 mg Oral Daily  . LORazepam  0-4 mg Intravenous Q12H  . multivitamin with minerals  1 tablet Oral Daily  . nicotine  21 mg Transdermal Daily  . sodium chloride flush  10-40 mL Intracatheter Q12H  . thiamine  100 mg Oral Daily   Or  . thiamine  100 mg Intravenous Daily  . vancomycin  1,000 mg Intravenous Q8H    SUBJECTIVE: No change in her chest and back pain that has exacerbated by dry cough.  Review of Systems: Review of Systems  Constitutional: Positive for fever and malaise/fatigue. Negative for chills and diaphoresis.  Respiratory: Positive for cough. Negative for sputum production and shortness of breath.   Cardiovascular: Positive for chest pain.  Gastrointestinal: Negative for abdominal pain, diarrhea, nausea and vomiting.  Musculoskeletal: Positive for back pain.  Neurological: Negative for headaches.  Psychiatric/Behavioral: Positive for substance abuse.    Past Medical History:  Diagnosis Date  . Asthma   . ETOH abuse   . Hepatitis C   . IVDU (intravenous drug user)     Social History  Substance Use Topics  . Smoking status:  Current Every Day Smoker    Packs/day: 1.00  . Smokeless tobacco: Never Used  . Alcohol use No    History reviewed. No pertinent family history. No Known Allergies  OBJECTIVE: Vitals:   07/14/16 0400 07/14/16 0739 07/14/16 1134 07/14/16 1540  BP: (!) 138/100 (!) 175/125 108/79 (!) 122/105  Pulse: 100 (!) 120 96 (!) 114  Resp: (!) 21 (!) 56 (!) 24 (!) 32  Temp: 97.8 F (36.6 C) (!) 101.1 F (38.4 C) 98.4 F (36.9 C) 98.4 F (36.9 C)  TempSrc: Oral Oral Oral Oral  SpO2:   93%   Weight: 131 lb (59.4 kg)     Height:       Body mass index is 23.96 kg/m.  Physical Exam  Constitutional: She is oriented to person, place, and time.  She is more talkative today.  HENT:  Mouth/Throat: No oropharyngeal exudate.  Cardiovascular: Regular rhythm.   No murmur heard. She remains tachycardic.  Pulmonary/Chest: Effort normal. She has no wheezes. She has rales.  Abdominal: Soft. There is no tenderness.  Musculoskeletal: Normal range of motion. She exhibits no edema or tenderness.  Neurological: She is alert and oriented to person, place, and time.  Skin: No rash noted.    Lab Results Lab Results  Component Value Date   WBC 16.2 (H) 07/14/2016   HGB  8.5 (L) 07/14/2016   HCT 26.3 (L) 07/14/2016   MCV 83.0 07/14/2016   PLT 305 07/14/2016    Lab Results  Component Value Date   CREATININE 0.57 07/14/2016   BUN 8 07/13/2016   NA 135 07/13/2016   K 4.1 07/13/2016   CL 102 07/13/2016   CO2 26 07/13/2016    Lab Results  Component Value Date   ALT 19 07/08/2016   AST 31 07/08/2016   ALKPHOS 93 07/08/2016   BILITOT 0.5 07/08/2016     Microbiology: Recent Results (from the past 240 hour(s))  Blood Culture (routine x 2)     Status: Abnormal   Collection Time: 07/07/16 12:40 AM  Result Value Ref Range Status   Specimen Description BLOOD BLOOD RIGHT FOREARM  Final   Special Requests BOTTLES DRAWN AEROBIC AND ANAEROBIC 5CC EACH  Final   Culture  Setup Time   Final    GRAM  POSITIVE COCCI IN CLUSTERS IN BOTH AEROBIC AND ANAEROBIC BOTTLES CRITICAL RESULT CALLED TO, READ BACK BY AND VERIFIED WITH: Lowella BandyL. Curran Pharm.D. 17:15 07/07/16  (wilsonm) [facility]    Culture METHICILLIN RESISTANT STAPHYLOCOCCUS AUREUS (A)  Final   Report Status 07/09/2016 FINAL  Final   Organism ID, Bacteria METHICILLIN RESISTANT STAPHYLOCOCCUS AUREUS  Final      Susceptibility   Methicillin resistant staphylococcus aureus - MIC*    CIPROFLOXACIN <=0.5 SENSITIVE Sensitive     ERYTHROMYCIN >=8 RESISTANT Resistant     GENTAMICIN <=0.5 SENSITIVE Sensitive     OXACILLIN >=4 RESISTANT Resistant     TETRACYCLINE <=1 SENSITIVE Sensitive     VANCOMYCIN 1 SENSITIVE Sensitive     TRIMETH/SULFA <=10 SENSITIVE Sensitive     CLINDAMYCIN <=0.25 SENSITIVE Sensitive     RIFAMPIN <=0.5 SENSITIVE Sensitive     Inducible Clindamycin NEGATIVE Sensitive     * METHICILLIN RESISTANT STAPHYLOCOCCUS AUREUS  Blood Culture ID Panel (Reflexed)     Status: Abnormal   Collection Time: 07/07/16 12:40 AM  Result Value Ref Range Status   Enterococcus species NOT DETECTED NOT DETECTED Final   Listeria monocytogenes NOT DETECTED NOT DETECTED Final   Staphylococcus species DETECTED (A) NOT DETECTED Final    Comment: CRITICAL RESULT CALLED TO, READ BACK BY AND VERIFIED WITH: LHosie Poisson. Curran Pharm.D. 17:15 07/07/16 (wilsonm)    Staphylococcus aureus DETECTED (A) NOT DETECTED Final    Comment: CRITICAL RESULT CALLED TO, READ BACK BY AND VERIFIED WITH: LHosie Poisson. Curran Pharm.D. 17:15 07/07/16 (wilsonm)    Methicillin resistance DETECTED (A) NOT DETECTED Final    Comment: CRITICAL RESULT CALLED TO, READ BACK BY AND VERIFIED WITH: LHosie Poisson. Curran Pharm.D. 17:15 07/07/16 (wilsonm)    Streptococcus species NOT DETECTED NOT DETECTED Final   Streptococcus agalactiae NOT DETECTED NOT DETECTED Final   Streptococcus pneumoniae NOT DETECTED NOT DETECTED Final   Streptococcus pyogenes NOT DETECTED NOT DETECTED Final    Acinetobacter baumannii NOT DETECTED NOT DETECTED Final   Enterobacteriaceae species NOT DETECTED NOT DETECTED Final   Enterobacter cloacae complex NOT DETECTED NOT DETECTED Final   Escherichia coli NOT DETECTED NOT DETECTED Final   Klebsiella oxytoca NOT DETECTED NOT DETECTED Final   Klebsiella pneumoniae NOT DETECTED NOT DETECTED Final   Proteus species NOT DETECTED NOT DETECTED Final   Serratia marcescens NOT DETECTED NOT DETECTED Final   Haemophilus influenzae NOT DETECTED NOT DETECTED Final   Neisseria meningitidis NOT DETECTED NOT DETECTED Final   Pseudomonas aeruginosa NOT DETECTED NOT DETECTED Final   Candida albicans NOT  DETECTED NOT DETECTED Final   Candida glabrata NOT DETECTED NOT DETECTED Final   Candida krusei NOT DETECTED NOT DETECTED Final   Candida parapsilosis NOT DETECTED NOT DETECTED Final   Candida tropicalis NOT DETECTED NOT DETECTED Final    Comment: Performed at [facility]  Blood Culture (routine x 2)     Status: Abnormal   Collection Time: 07/07/16 12:50 AM  Result Value Ref Range Status   Specimen Description BLOOD BLOOD LEFT FOREARM  Final   Special Requests IN PEDIATRIC BOTTLE 3CC  Final   Culture  Setup Time   Final    GRAM POSITIVE COCCI IN CLUSTERS AEROBIC BOTTLE ONLY CRITICAL VALUE NOTED.  VALUE IS CONSISTENT WITH PREVIOUSLY REPORTED AND CALLED VALUE.    Culture (A)  Final    STAPHYLOCOCCUS AUREUS SUSCEPTIBILITIES PERFORMED ON PREVIOUS CULTURE WITHIN THE LAST 5 DAYS. Performed at [facility]    Report Status 07/09/2016 FINAL  Final  Urine culture     Status: None   Collection Time: 07/07/16  1:10 AM  Result Value Ref Range Status   Specimen Description URINE, CATHETERIZED  Final   Special Requests NONE  Final   Culture NO GROWTH Performed at [facility]   Final   Report Status 07/08/2016 FINAL  Final  MRSA PCR Screening     Status: Abnormal   Collection Time: 07/07/16  4:12 PM  Result Value Ref Range Status   MRSA  by PCR POSITIVE (A) NEGATIVE Final    Comment:        The GeneXpert MRSA Assay (FDA approved for NASAL specimens only), is one component of a comprehensive MRSA colonization surveillance program. It is not intended to diagnose MRSA infection nor to guide or monitor treatment for MRSA infections. RESULT CALLED TO, READ BACK BY AND VERIFIED WITH: Alverda Skeans RN 17:50 07/07/16 (wilsonm)   Respiratory Panel by PCR     Status: None   Collection Time: 07/08/16  6:52 AM  Result Value Ref Range Status   Adenovirus NOT DETECTED NOT DETECTED Final   Coronavirus 229E NOT DETECTED NOT DETECTED Final   Coronavirus HKU1 NOT DETECTED NOT DETECTED Final   Coronavirus NL63 NOT DETECTED NOT DETECTED Final   Coronavirus OC43 NOT DETECTED NOT DETECTED Final   Metapneumovirus NOT DETECTED NOT DETECTED Final   Rhinovirus / Enterovirus NOT DETECTED NOT DETECTED Final   Influenza A NOT DETECTED NOT DETECTED Final   Influenza B NOT DETECTED NOT DETECTED Final   Parainfluenza Virus 1 NOT DETECTED NOT DETECTED Final   Parainfluenza Virus 2 NOT DETECTED NOT DETECTED Final   Parainfluenza Virus 3 NOT DETECTED NOT DETECTED Final   Parainfluenza Virus 4 NOT DETECTED NOT DETECTED Final   Respiratory Syncytial Virus NOT DETECTED NOT DETECTED Final   Bordetella pertussis NOT DETECTED NOT DETECTED Final   Chlamydophila pneumoniae NOT DETECTED NOT DETECTED Final   Mycoplasma pneumoniae NOT DETECTED NOT DETECTED Final  Culture, blood (routine x 2)     Status: None   Collection Time: 07/08/16  9:50 AM  Result Value Ref Range Status   Specimen Description BLOOD RIGHT HAND  Final   Special Requests BOTTLES DRAWN AEROBIC ONLY 5CC  Final   Culture NO GROWTH 5 DAYS  Final   Report Status 07/13/2016 FINAL  Final  Culture, blood (routine x 2)     Status: Abnormal   Collection Time: 07/08/16 10:00 AM  Result Value Ref Range Status   Specimen Description BLOOD LEFT HAND  Final  Special Requests IN PEDIATRIC BOTTLE 2CC   Final   Culture  Setup Time   Final    IN PEDIATRIC BOTTLE GRAM POSITIVE COCCI IN CLUSTERS CRITICAL RESULT CALLED TO, READ BACK BY AND VERIFIED WITH: J LEDFORD,PHARMD AT 0715 07/09/16 BY L BENFIELD    Culture METHICILLIN RESISTANT STAPHYLOCOCCUS AUREUS (A)  Final   Report Status 07/11/2016 FINAL  Final   Organism ID, Bacteria METHICILLIN RESISTANT STAPHYLOCOCCUS AUREUS  Final      Susceptibility   Methicillin resistant staphylococcus aureus - MIC*    CIPROFLOXACIN <=0.5 SENSITIVE Sensitive     ERYTHROMYCIN >=8 RESISTANT Resistant     GENTAMICIN <=0.5 SENSITIVE Sensitive     OXACILLIN >=4 RESISTANT Resistant     TETRACYCLINE <=1 SENSITIVE Sensitive     VANCOMYCIN 1 SENSITIVE Sensitive     TRIMETH/SULFA <=10 SENSITIVE Sensitive     CLINDAMYCIN <=0.25 SENSITIVE Sensitive     RIFAMPIN <=0.5 SENSITIVE Sensitive     Inducible Clindamycin NEGATIVE Sensitive     * METHICILLIN RESISTANT STAPHYLOCOCCUS AUREUS  Culture, blood (routine x 2)     Status: Abnormal   Collection Time: 07/09/16  6:24 PM  Result Value Ref Range Status   Specimen Description BLOOD LEFT FOREARM  Final   Special Requests IN PEDIATRIC BOTTLE 3CC  Final   Culture  Setup Time   Final    GRAM POSITIVE COCCI IN CLUSTERS IN PEDIATRIC BOTTLE CRITICAL RESULT CALLED TO, READ BACK BY AND VERIFIED WITH: ALaural Benes PHARM [id number]    Culture (A)  Final    STAPHYLOCOCCUS AUREUS SUSCEPTIBILITIES PERFORMED ON PREVIOUS CULTURE WITHIN THE LAST 5 DAYS.    Report Status 07/11/2016 FINAL  Final  Culture, blood (routine x 2)     Status: None (Preliminary result)   Collection Time: 07/09/16  6:25 PM  Result Value Ref Range Status   Specimen Description BLOOD LEFT HAND  Final   Special Requests BOTTLES DRAWN AEROBIC ONLY 6CC  Final   Culture NO GROWTH 4 DAYS  Final   Report Status PENDING  Incomplete  Culture, blood (routine x 2)     Status: None (Preliminary result)   Collection Time: 07/11/16 10:10 AM  Result Value Ref  Range Status   Specimen Description BLOOD LEFT ANTECUBITAL  Final   Special Requests BOTTLES DRAWN AEROBIC ONLY 5CC  Final   Culture NO GROWTH 2 DAYS  Final   Report Status PENDING  Incomplete  Culture, blood (routine x 2)     Status: None (Preliminary result)   Collection Time: 07/11/16 10:15 AM  Result Value Ref Range Status   Specimen Description BLOOD LEFT ARM  Final   Special Requests BOTTLES DRAWN AEROBIC ONLY 5CC  Final   Culture NO GROWTH 2 DAYS  Final   Report Status PENDING  Incomplete     ASSESSMENT: She remains febrile but it looks like her temperature curve may be starting to trend downward. Her last blood culture remains negative and her chest x-ray is showing some improvement. I think she will continue to improve with more time and vancomycin.  PLAN: 1. Continue vancomycin 2. Please call Dr. Daiva Eves [phone]) for any infectious disease questions this weekend  [name], MD [facility] [phone] pager   [phone] cell 07/14/2016, 3:58 PM

## 2016-07-14 NOTE — Consult Note (Addendum)
WOC Nurse wound consult note Reason for Consult: Consult requested for buttocks.  Pt refuses to turn and states; "You do not need to look at anything, it is fine."  She reports that an RN applied a foam dressing to the site earlier, but she did not like it and pulled the protective dressing off. Wound type: Pt is reported to have a stage 2 pressure injury to buttocks in the nursing flowsheet. Dressing procedure/placement/frequency: Unable to determine if it is a fissure related to moisture or a pressure injury since patient declined to turn and allow assessment.  Barrier cream to protect the location if she allows the staff to apply. Please re-consult if further assistance is needed.  Thank-you,  Cammie Mcgeeawn Hyrum Shaneyfelt MSN, RN, CWOCN, [place]­WCN-AP, CNS [phone]

## 2016-07-14 NOTE — Progress Notes (Addendum)
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 aorta is normal caliber. No abdominal lymphadenopathy. Reproductive: Uterus and ovaries grossly normal Other: Smaller free fluid in the posterior pelvis Musculoskeletal: No aggressive osseous lesion. Review of the MIP images confirms the above findings. IMPRESSION: Chest Impression: 1. Bilateral thick-walled cavitary nodules consistent with SEPTIC PULMONARY EMBOLI in this population (young patient with IV drug use). 2. Bibasilar consolidation consistent with pneumonia Abdomen / Pelvis Impression: 1. 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 2. Hepatosplenomegaly. Low-density lesion in the spleen is indeterminate. 3. Small volume of intraperitoneal free fluid and periportal edema may relate to hepatic dysfunction.  Electronically Signed   By: Genevive Bi M.D.   On: 07/07/2016 09:29   Dg Chest Port 1 View  Result Date: 07/08/2016 CLINICAL DATA:  Patient admitted yesterday with MR SA bacteremia. Possible aspiration today. EXAM: PORTABLE CHEST 1 VIEW COMPARISON:  CT chest and PA and lateral chest 07/07/2014. FINDINGS: Multiple nodular opacities bilaterally again seen. There is increased bibasilar airspace disease. No pneumothorax. Small pleural effusions noted. Heart size is normal. IMPRESSION: Increased bibasilar airspace disease could be due to atelectasis, aspiration and/or pneumonia. Multifocal nodular opacities compatible with septic emboli as seen on chest CT yesterday. Electronically Signed   By: Drusilla Kanner M.D.   On: 07/08/2016 12:07    Time Spent in minutes 35   Eddie North M.D on 07/14/2016 at 3:50 PM  Between 7am to 7pm - Pager - [phone]  After 7pm go to [email or web address] - password [facility]  [facility] -  Office  [phone]

## 2016-07-14 NOTE — Plan of Care (Signed)
Problem: Safety: Goal: Ability to remain free from injury will improve Outcome: Progressing Pt required reinforcement of fall prevention and safety education this evening when trying to get OOB unassisted. Pt verbalizes understanding after education, bed alarm on per high fall protocol.

## 2016-07-14 NOTE — Care Management Note (Addendum)
Case Management Note  Patient Details  Name: Anna CaterConstance Colon MRN: [id number] Date of Birth: September 11, 1980  Subjective/Objective:                 Plan was for DC to SNF today (see CSW notes), however patient developed fever >101 in past 24 hours. Anticipate DC to SNF for IV Abx when stable.    Action/Plan: Plan is for placement for long term IV antibiotic therapy. Pt is current IVDU. CSW following for d/c needs.   Expected Discharge Date:                  Expected Discharge Plan:  Home/Self Care  In-House Referral:  NA  Discharge planning Services  CM Consult, Medication Assistance, Indigent Health Clinic  Post Acute Care Choice:  NA Choice offered to:  NA  DME Arranged:  N/A DME Agency:  NA  HH Arranged:  NA HH Agency:  NA  Status of Service:  In process, will continue to follow  If discussed at Long Length of Stay Meetings, dates discussed:    Additional Comments:  Kermit BaloKelli F Willard, RN 07/14/2016, 3:23 PM

## 2016-07-14 NOTE — Progress Notes (Signed)
Pharmacy Antibiotic Note  Anna Colon is a 36 y.o. female with history of IV drug abuse released from jail 07/06/16 who presents with nausea, vomiting, and fever (tmax 104), and chills admitted on 07/07/2016 with sepsis.  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  WBC remains slightly elevated 11, continues to spike fevers 102.    Vancomycin trough 24 slightly greater than goal 15-20 but drawn 1.5hr early so true trough closer to 20 on vancomycin 1000 mg IV q 8 hrs. Due to severity of illness and stable renal function - will continue same dose with close monitoring of VT.    Plan: Continue vancomycin to 1g IV q 8 hrs Will continue to monitor renal function, and vancomycin trough again in the next day or two.  Height: 5\' 2"  (157.5 cm) Weight: 131 lb (59.4 kg) IBW/kg (Calculated) : 50.1  Temp (24hrs), Avg:99.7 F (37.6 C), Min:97.8 F (36.6 C), Max:101.9 F (38.8 C)   Recent Labs Lab 07/07/16 1821  07/07/16 1837 07/07/16 2127 07/08/16 0234  07/10/16 0942 07/10/16 1318 07/12/16 0347 07/13/16 0315 07/14/16 0530 07/14/16 0841 07/14/16 1141 07/14/16 1300  WBC  --   --  13.5*  --  10.7*  --  11.6*  --  11.2*  --   --  16.2*  --   --   CREATININE  --   < > 0.84  --  0.65  --  0.66  --  0.66 0.65 0.57  --   --   --   LATICACIDVEN 3.3*  --   --  2.6*  --   --   --   --   --   --   --  1.8 1.1  --   VANCOTROUGH  --   --   --   --   --   < >  --  18  --   --   --   --   --  24*  < > = values in this interval not displayed.  Estimated Creatinine Clearance: 76.2 mL/min (by C-G formula based on SCr of 0.57 mg/dL).    No Known Allergies   Antimicrobials this admission:  Vanc 1/5 >>  Zosyn 1/5 >> 1/5  Dose adjustments this admission:  1/6 VT = 15 - on vanc 750 mg IV q 8 hrs 1/8 VT = 18 - on vanc 1g IV q 8 hrs 1/12 VT 24 - drawn early   Microbiology results:  1/5 BCx: Staph aureus **1/5 BCID - MRSA 1/5 UCx: neg 1/5 MRSA PCR: (+) 1/6 resp  panel pending 1/6 BCx x 2 > 1/2 MRSA 1/7 BCx x 2 > 1/2 staph aureus 1/9  Bcx ngtd 1/6 HCV Ab > 11, viral load not calc- spontaneous clearing per ID  Leota SauersLisa Bettye Sitton Pharm.D. CPP, BCPS Clinical Pharmacist [phone] 07/14/2016 1:01 PM

## 2016-07-15 LAB — CULTURE, BLOOD (ROUTINE X 2): CULTURE: NO GROWTH

## 2016-07-15 MED ORDER — HYDROMORPHONE HCL 1 MG/ML IJ SOLN
1.0000 mg | Freq: Four times a day (QID) | INTRAMUSCULAR | Status: DC | PRN
  Administered 2016-07-15 – 2016-07-19 (×16): 1 mg via INTRAVENOUS
  Filled 2016-07-15 (×16): qty 1

## 2016-07-15 MED ORDER — ZOLPIDEM TARTRATE 5 MG PO TABS
5.0000 mg | ORAL_TABLET | Freq: Every evening | ORAL | Status: DC | PRN
  Administered 2016-07-16 – 2016-08-07 (×12): 5 mg via ORAL
  Filled 2016-07-15 (×16): qty 1

## 2016-07-15 NOTE — Progress Notes (Signed)
Patient transferred to 6E via bed with all belongings accounted for. PAtient left in care of tech obtaining VS and placing patient on tele box. Assigned RN updated on meds given and stated she needed nothing else from this nurse.

## 2016-07-15 NOTE — Progress Notes (Signed)
LCSW following for possible needs regarding disposition and long term antibiotics. Staffed case with medical director:Aronson.  SNF is unlikely due to patient released on bond and outstanding warrants. LCSW also referred for substance abuse resources in which aren't appropriate at this time due to medical acuity and need for long term antibiotics.  Patient also refused.    Plan:  Will discuss with AD: Zack regarding difficult to place/disposition on Monday and if one of the [facility] contracted facilities will accept.    Deretha EmoryHannah Liseth Wann LCSW, MSW Clinical Social Work: Optician, dispensingystem [employer] [place]

## 2016-07-15 NOTE — Progress Notes (Addendum)
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 images confirms the above findings. IMPRESSION: Chest Impression: 1. Bilateral thick-walled cavitary nodules consistent with SEPTIC PULMONARY EMBOLI in this population (young patient with IV drug use). 2. Bibasilar consolidation consistent with pneumonia Abdomen / Pelvis Impression: 1. 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 2. Hepatosplenomegaly. Low-density lesion in the spleen is indeterminate. 3. Small volume of intraperitoneal free fluid and periportal edema may relate to hepatic dysfunction. Electronically Signed   By: Genevive Bi M.D.   On: 07/07/2016 09:29   Dg Chest Port 1 View  Result Date: 07/08/2016 CLINICAL DATA:  Patient admitted yesterday with MR SA bacteremia. Possible aspiration today. EXAM: PORTABLE CHEST 1 VIEW  COMPARISON:  CT chest and PA and lateral chest 07/07/2014. FINDINGS: Multiple nodular opacities bilaterally again seen. There is increased bibasilar airspace disease. No pneumothorax. Small pleural effusions noted. Heart size is normal. IMPRESSION: Increased bibasilar airspace disease could be due to atelectasis, aspiration and/or pneumonia. Multifocal nodular opacities compatible with septic emboli as seen on chest CT yesterday. Electronically Signed   By: Drusilla Kanner M.D.   On: 07/08/2016 12:07    Time Spent in minutes 35   Eddie North M.D on 07/15/2016 at 12:49 PM  Between 7am to 7pm - Pager - [phone]  After 7pm go to [email or web address] - password [facility]  [facility] -  Office  [phone]

## 2016-07-16 ENCOUNTER — Inpatient Hospital Stay (HOSPITAL_COMMUNITY): Payer: Self-pay

## 2016-07-16 DIAGNOSIS — D649 Anemia, unspecified: Secondary | ICD-10-CM

## 2016-07-16 LAB — CBC
HEMATOCRIT: 20.7 % — AB (ref 36.0–46.0)
Hemoglobin: 6.6 g/dL — CL (ref 12.0–15.0)
MCH: 27.2 pg (ref 26.0–34.0)
MCHC: 31.9 g/dL (ref 30.0–36.0)
MCV: 85.2 fL (ref 78.0–100.0)
PLATELETS: 244 10*3/uL (ref 150–400)
RBC: 2.43 MIL/uL — AB (ref 3.87–5.11)
RDW: 16.9 % — ABNORMAL HIGH (ref 11.5–15.5)
WBC: 13.2 10*3/uL — ABNORMAL HIGH (ref 4.0–10.5)

## 2016-07-16 LAB — CULTURE, BLOOD (ROUTINE X 2)
CULTURE: NO GROWTH
Culture: NO GROWTH

## 2016-07-16 LAB — PREPARE RBC (CROSSMATCH)

## 2016-07-16 LAB — ABO/RH: ABO/RH(D): A POS

## 2016-07-16 MED ORDER — FUROSEMIDE 10 MG/ML IJ SOLN
INTRAMUSCULAR | Status: AC
  Administered 2016-07-16: 60 mg via INTRAVENOUS
  Filled 2016-07-16: qty 2

## 2016-07-16 MED ORDER — ZOLPIDEM TARTRATE 5 MG PO TABS
5.0000 mg | ORAL_TABLET | Freq: Every evening | ORAL | Status: DC | PRN
Start: 2016-07-16 — End: 2016-07-16

## 2016-07-16 MED ORDER — SODIUM CHLORIDE 0.9 % IV SOLN
Freq: Once | INTRAVENOUS | Status: AC
  Administered 2016-07-16: 22:00:00 via INTRAVENOUS

## 2016-07-16 MED ORDER — FUROSEMIDE 10 MG/ML IJ SOLN
60.0000 mg | Freq: Once | INTRAMUSCULAR | Status: AC
  Administered 2016-07-16: 60 mg via INTRAVENOUS
  Filled 2016-07-16: qty 6

## 2016-07-16 NOTE — Progress Notes (Signed)
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 Smaller free fluid in the posterior pelvis Musculoskeletal: No aggressive osseous lesion. Review of the MIP images confirms the above findings. IMPRESSION: Chest Impression: 1. Bilateral thick-walled cavitary nodules consistent with SEPTIC PULMONARY EMBOLI in this population (young patient with IV drug use). 2. Bibasilar consolidation consistent with pneumonia Abdomen / Pelvis Impression: 1. 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 2. Hepatosplenomegaly. Low-density lesion in the spleen is indeterminate. 3. Small volume of intraperitoneal free fluid and periportal edema may relate to hepatic dysfunction. Electronically Signed   By: Genevive Bi M.D.   On: 07/07/2016 09:29   Dg Chest Port 1 View  Result Date: 07/16/2016 CLINICAL DATA:  MRSA bacteremia  with septic pulmonary emboli. EXAM: PORTABLE CHEST 1 VIEW COMPARISON:  07/13/2016. FINDINGS: Right PICC tip at the superior cavoatrial junction. Normal sized heart. Mildly increased bilateral airspace opacity and bilateral pleural fluid. Unremarkable bones. IMPRESSION: Mildly increased bilateral pneumonia and pleural fluid. Electronically Signed   By: Beckie Salts M.D.   On: 07/16/2016 09:53   Dg Chest Port 1 View  Result Date: 07/08/2016 CLINICAL DATA:  Patient admitted yesterday with MR SA bacteremia. Possible aspiration today. EXAM: PORTABLE CHEST 1 VIEW COMPARISON:  CT chest and PA and lateral chest 07/07/2014. FINDINGS: Multiple nodular opacities bilaterally again seen. There is increased bibasilar airspace disease. No pneumothorax. Small pleural effusions noted. Heart size is normal. IMPRESSION: Increased bibasilar airspace disease could be due to atelectasis, aspiration and/or pneumonia. Multifocal nodular opacities compatible with septic emboli as seen on chest CT yesterday. Electronically Signed   By: Drusilla Kanner M.D.   On: 07/08/2016 12:07    Time Spent in minutes 35   Eddie North M.D on 07/16/2016 at 11:58 AM  Between 7am to 7pm - Pager - [phone]  After 7pm go to [email or web address] - password [facility]  [facility] -  Office  [phone]

## 2016-07-16 NOTE — Progress Notes (Signed)
CRITICAL VALUE ALERT  Critical value received:  Hgb = 6.6  Date of notification:  07-16-2016  Time of notification:  10:09  Critical value read back:Yes.    Nurse who received alert:  Meridee ScoreAndy Emma Schupp, RN  MD notified (1st page):  Dhungel  Time of first page:  10:09  MD notified (2nd page):  Time of second page:  Responding MD:  Dhungel  Time MD responded:  10:11

## 2016-07-17 DIAGNOSIS — D508 Other iron deficiency anemias: Secondary | ICD-10-CM

## 2016-07-17 LAB — CBC
HEMATOCRIT: 26.5 % — AB (ref 36.0–46.0)
HEMOGLOBIN: 8.6 g/dL — AB (ref 12.0–15.0)
MCH: 27.3 pg (ref 26.0–34.0)
MCHC: 32.5 g/dL (ref 30.0–36.0)
MCV: 84.1 fL (ref 78.0–100.0)
Platelets: 299 10*3/uL (ref 150–400)
RBC: 3.15 MIL/uL — ABNORMAL LOW (ref 3.87–5.11)
RDW: 16.2 % — AB (ref 11.5–15.5)
WBC: 13 10*3/uL — ABNORMAL HIGH (ref 4.0–10.5)

## 2016-07-17 LAB — TYPE AND SCREEN
ABO/RH(D): A POS
Antibody Screen: NEGATIVE
UNIT DIVISION: 0

## 2016-07-17 LAB — BASIC METABOLIC PANEL
ANION GAP: 8 (ref 5–15)
BUN: 9 mg/dL (ref 6–20)
CALCIUM: 8.3 mg/dL — AB (ref 8.9–10.3)
CHLORIDE: 96 mmol/L — AB (ref 101–111)
CO2: 28 mmol/L (ref 22–32)
Creatinine, Ser: 0.63 mg/dL (ref 0.44–1.00)
GFR calc non Af Amer: >60 mL/min (ref >60)
GLUCOSE: 94 mg/dL (ref 65–99)
Potassium: 4.3 mmol/L (ref 3.5–5.1)
Sodium: 132 mmol/L — ABNORMAL LOW (ref 135–145)

## 2016-07-17 NOTE — Progress Notes (Signed)
Patient ID: Anna Colon, female   DOB: 24-Dec-1980, 36 y.o.   MRN: [id number]          [facility]  Date of Admission:  07/07/2016           Day 11 vancomycin  Principal Problem:   MRSA bacteremia Active Problems:   Cavitary pneumonia   Endocarditis of tricuspid valve   Sepsis (HCC)   IVDU (intravenous drug user)   Cigarette smoker   ETOH abuse   Hepatitis C antibody test positive   Normocytic anemia   Thrombocytopenia (HCC)   Acute septic pulmonary embolism without acute cor pulmonale (HCC)   Sepsis due to methicillin resistant Staphylococcus aureus (MRSA) (HCC)   Pressure injury of skin   . cloNIDine  0.1 mg Oral BID  . enoxaparin (LOVENOX) injection  40 mg Subcutaneous Q24H  . feeding supplement (ENSURE ENLIVE)  237 mL Oral BID BM  . ferrous sulfate  325 mg Oral BID WC  . folic acid  1 mg Oral Daily  . multivitamin with minerals  1 tablet Oral Daily  . nicotine  21 mg Transdermal Daily  . sodium chloride flush  10-40 mL Intracatheter Q12H  . thiamine  100 mg Oral Daily   Or  . thiamine  100 mg Intravenous Daily  . vancomycin  1,000 mg Intravenous Q8H    SUBJECTIVE: She says there've been no change in her back pain. She continues to have cough that is now productive of thin sputum flecked with blood. He has not had any abdominal pain, diarrhea or dysuria.  Review of Systems: Review of Systems  Constitutional: Positive for chills, fever and malaise/fatigue. Negative for diaphoresis.  Respiratory: Positive for cough, hemoptysis and sputum production. Negative for shortness of breath.   Cardiovascular: Positive for chest pain.  Gastrointestinal: Negative for abdominal pain, diarrhea, heartburn, nausea and vomiting.  Genitourinary: Negative for dysuria and frequency.  Musculoskeletal: Positive for back pain.  Skin: Negative for itching and rash.  Neurological: Positive for weakness. Negative for headaches.  Psychiatric/Behavioral:  Positive for depression and substance abuse.    Past Medical History:  Diagnosis Date  . Asthma   . ETOH abuse   . Hepatitis C   . IVDU (intravenous drug user)     Social History  Substance Use Topics  . Smoking status: Current Every Day Smoker    Packs/day: 1.00  . Smokeless tobacco: Never Used  . Alcohol use No    History reviewed. No pertinent family history. No Known Allergies  OBJECTIVE: Vitals:   07/16/16 1855 07/16/16 2025 07/17/16 0607 07/17/16 0919  BP: (!) 154/100 (!) 167/95 (!) 146/94 118/77  Pulse: (!) 107 (!) 112 (!) 110 95  Resp:  (!) 23 (!) 21 19  Temp: 98.3 F (36.8 C) (!) 103.1 F (39.5 C) (!) 102.2 F (39 C) 98.3 F (36.8 C)  TempSrc: Oral Oral Oral Oral  SpO2:  95% 96% 98%  Weight:      Height:       Body mass index is 25.32 kg/m.  Physical Exam  Constitutional: She is oriented to person, place, and time.  She is sleeping quietly in a darkened room. She arouses easily.  HENT:  Mouth/Throat: No oropharyngeal exudate.  Eyes: Conjunctivae are normal.  Neck: Neck supple.  Cardiovascular: Regular rhythm.   No murmur heard. Tachycardic.  Pulmonary/Chest:  Decreased breath sounds in bases posteriorly.  Abdominal: Soft. There is no tenderness.  Musculoskeletal: Normal range of motion. She exhibits  no edema or tenderness.  Neurological: She is alert and oriented to person, place, and time.  Skin: No rash noted.  Right arm PICC site looks okay.    Lab Results Lab Results  Component Value Date   WBC 13.0 (H) 07/17/2016   HGB 8.6 (L) 07/17/2016   HCT 26.5 (L) 07/17/2016   MCV 84.1 07/17/2016   PLT 299 07/17/2016    Lab Results  Component Value Date   CREATININE 0.63 07/17/2016   BUN 9 07/17/2016   NA 132 (L) 07/17/2016   K 4.3 07/17/2016   CL 96 (L) 07/17/2016   CO2 28 07/17/2016    Lab Results  Component Value Date   ALT 19 07/08/2016   AST 31 07/08/2016   ALKPHOS 93 07/08/2016   BILITOT 0.5 07/08/2016      Microbiology: Recent Results (from the past 240 hour(s))  MRSA PCR Screening     Status: Abnormal   Collection Time: 07/07/16  4:12 PM  Result Value Ref Range Status   MRSA by PCR POSITIVE (A) NEGATIVE Final    Comment:        The GeneXpert MRSA Assay (FDA approved for NASAL specimens only), is one component of a comprehensive MRSA colonization surveillance program. It is not intended to diagnose MRSA infection nor to guide or monitor treatment for MRSA infections. RESULT CALLED TO, READ BACK BY AND VERIFIED WITH: Alverda Skeans RN 17:50 07/07/16 ([email or web address])   Respiratory Panel by PCR     Status: None   Collection Time: 07/08/16  6:52 AM  Result Value Ref Range Status   Adenovirus NOT DETECTED NOT DETECTED Final   Coronavirus 229E NOT DETECTED NOT DETECTED Final   Coronavirus HKU1 NOT DETECTED NOT DETECTED Final   Coronavirus NL63 NOT DETECTED NOT DETECTED Final   Coronavirus OC43 NOT DETECTED NOT DETECTED Final   Metapneumovirus NOT DETECTED NOT DETECTED Final   Rhinovirus / Enterovirus NOT DETECTED NOT DETECTED Final   Influenza A NOT DETECTED NOT DETECTED Final   Influenza B NOT DETECTED NOT DETECTED Final   Parainfluenza Virus 1 NOT DETECTED NOT DETECTED Final   Parainfluenza Virus 2 NOT DETECTED NOT DETECTED Final   Parainfluenza Virus 3 NOT DETECTED NOT DETECTED Final   Parainfluenza Virus 4 NOT DETECTED NOT DETECTED Final   Respiratory Syncytial Virus NOT DETECTED NOT DETECTED Final   Bordetella pertussis NOT DETECTED NOT DETECTED Final   Chlamydophila pneumoniae NOT DETECTED NOT DETECTED Final   Mycoplasma pneumoniae NOT DETECTED NOT DETECTED Final  Culture, blood (routine x 2)     Status: None   Collection Time: 07/08/16  9:50 AM  Result Value Ref Range Status   Specimen Description BLOOD RIGHT HAND  Final   Special Requests BOTTLES DRAWN AEROBIC ONLY 5CC  Final   Culture NO GROWTH 5 DAYS  Final   Report Status 07/13/2016 FINAL  Final  Culture, blood (routine x 2)      Status: Abnormal   Collection Time: 07/08/16 10:00 AM  Result Value Ref Range Status   Specimen Description BLOOD LEFT HAND  Final   Special Requests IN PEDIATRIC BOTTLE 2CC  Final   Culture  Setup Time   Final    IN PEDIATRIC BOTTLE GRAM POSITIVE COCCI IN CLUSTERS CRITICAL RESULT CALLED TO, READ BACK BY AND VERIFIED WITH: J LEDFORD,PHARMD AT 0715 07/09/16 BY L BENFIELD    Culture METHICILLIN RESISTANT STAPHYLOCOCCUS AUREUS (A)  Final   Report Status 07/11/2016 FINAL  Final   Organism ID, Bacteria METHICILLIN  RESISTANT STAPHYLOCOCCUS AUREUS  Final      Susceptibility   Methicillin resistant staphylococcus aureus - MIC*    CIPROFLOXACIN <=0.5 SENSITIVE Sensitive     ERYTHROMYCIN >=8 RESISTANT Resistant     GENTAMICIN <=0.5 SENSITIVE Sensitive     OXACILLIN >=4 RESISTANT Resistant     TETRACYCLINE <=1 SENSITIVE Sensitive     VANCOMYCIN 1 SENSITIVE Sensitive     TRIMETH/SULFA <=10 SENSITIVE Sensitive     CLINDAMYCIN <=0.25 SENSITIVE Sensitive     RIFAMPIN <=0.5 SENSITIVE Sensitive     Inducible Clindamycin NEGATIVE Sensitive     * METHICILLIN RESISTANT STAPHYLOCOCCUS AUREUS  Culture, blood (routine x 2)     Status: Abnormal   Collection Time: 07/09/16  6:24 PM  Result Value Ref Range Status   Specimen Description BLOOD LEFT FOREARM  Final   Special Requests IN PEDIATRIC BOTTLE 3CC  Final   Culture  Setup Time   Final    GRAM POSITIVE COCCI IN CLUSTERS IN PEDIATRIC BOTTLE CRITICAL RESULT CALLED TO, READ BACK BY AND VERIFIED WITH: ALaural Benes. [facility] [id number] BEAMJ    Culture (A)  Final    STAPHYLOCOCCUS AUREUS SUSCEPTIBILITIES PERFORMED ON PREVIOUS CULTURE WITHIN THE LAST 5 DAYS.    Report Status 07/11/2016 FINAL  Final  Culture, blood (routine x 2)     Status: None   Collection Time: 07/09/16  6:25 PM  Result Value Ref Range Status   Specimen Description BLOOD LEFT HAND  Final   Special Requests BOTTLES DRAWN AEROBIC ONLY 6CC  Final   Culture NO GROWTH 5 DAYS  Final    Report Status 07/15/2016 FINAL  Final  Culture, blood (routine x 2)     Status: None   Collection Time: 07/11/16 10:10 AM  Result Value Ref Range Status   Specimen Description BLOOD LEFT ANTECUBITAL  Final   Special Requests BOTTLES DRAWN AEROBIC ONLY 5CC  Final   Culture NO GROWTH 5 DAYS  Final   Report Status 07/16/2016 FINAL  Final  Culture, blood (routine x 2)     Status: None   Collection Time: 07/11/16 10:15 AM  Result Value Ref Range Status   Specimen Description BLOOD LEFT ARM  Final   Special Requests BOTTLES DRAWN AEROBIC ONLY 5CC  Final   Culture NO GROWTH 5 DAYS  Final   Report Status 07/16/2016 FINAL  Final     ASSESSMENT: She started having fever again this morning. Her chest x-ray shows some worsening of bibasilar infiltrates and effusions. No other obvious source of fever is present. Her last blood cultures on 07/11/2016 were negative. If her fevers continue I would repeat blood cultures and consider a chest CT scan looking for abscess and/or loculated pleural effusions that could represent empyema.  PLAN: 1. Continue vancomycin 2. Monitor temperature curve  Cliffton AstersJohn Tacoya Altizer, MD [facility] [phone] pager   [phone] cell 07/17/2016, 1:51 PM

## 2016-07-17 NOTE — Progress Notes (Signed)
Pharmacy Antibiotic Note  Anna CaterConstance Keziah is a 36 y.o. female with history of IV drug abuse released from jail 07/06/16 who presents with nausea, vomiting, and fever (tmax 104), and chills admitted on 07/07/2016 with sepsis.  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   The patient continues on Vancomycin. Renal function remains stable and dose is appropriate for now. Will plan to check a Vancomycin trough on 1/16 AM.   Plan: 1. Continue Vancomycin 1g IV every 8 hours 2. Will continue to follow renal function, culture results, LOT, and antibiotic de-escalation plans   Height: 5\' 2"  (157.5 cm) Weight: 138 lb 7.2 oz (62.8 kg) IBW/kg (Calculated) : 50.1  Temp (24hrs), Avg:99.9 F (37.7 C), Min:98.3 F (36.8 C), Max:103.1 F (39.5 C)   Recent Labs Lab 07/10/16 1318 07/12/16 0347 07/13/16 0315 07/14/16 0530 07/14/16 0841 07/14/16 1141 07/14/16 1300 07/16/16 0926 07/17/16 0541  WBC  --  11.2*  --   --  16.2*  --   --  13.2* 13.0*  CREATININE  --  0.66 0.65 0.57  --   --   --   --  0.63  LATICACIDVEN  --   --   --   --  1.8 1.1  --   --   --   VANCOTROUGH 18  --   --   --   --   --  24*  --   --     Estimated Creatinine Clearance: 83.9 mL/min (by C-G formula based on SCr of 0.63 mg/dL).    No Known Allergies   Antimicrobials this admission:  Vanc 1/5 >>  Zosyn 1/5 >> 1/5  Dose adjustments this admission:  1/6 VT = 15 - on vanc 750 mg IV q 8 hrs 1/8 VT = 18 - on vanc 1g IV q 8 hrs 1/12 VT 24 - drawn early   Microbiology results:  1/5 BCx: Staph aureus **1/5 BCID - MRSA 1/5 UCx: neg 1/5 MRSA PCR: (+) 1/6 resp panel pending 1/6 BCx x 2 > 1/2 MRSA 1/7 BCx x 2 > 1/2 staph aureus 1/9  Bcx ngtd 1/6 HCV Ab > 11, viral load not calc- spontaneous clearing per ID  Thank you for allowing pharmacy to be a part of this patient's care.  Georgina PillionElizabeth Delois Silvester, PharmD, BCPS Clinical Pharmacist Pager: [phone] Clinical phone for 07/17/2016 from  7a-3:30p: [phone] If after 3:30p, please call main pharmacy at: [phone] 07/17/2016 12:57 PM
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
## 2016-07-18 ENCOUNTER — Inpatient Hospital Stay (HOSPITAL_COMMUNITY): Payer: Self-pay

## 2016-07-18 DIAGNOSIS — I76 Septic arterial embolism: Secondary | ICD-10-CM

## 2016-07-18 DIAGNOSIS — R509 Fever, unspecified: Secondary | ICD-10-CM

## 2016-07-18 DIAGNOSIS — M25511 Pain in right shoulder: Secondary | ICD-10-CM

## 2016-07-18 LAB — CBC
HCT: 28.9 % — ABNORMAL LOW (ref 36.0–46.0)
Hemoglobin: 9.1 g/dL — ABNORMAL LOW (ref 12.0–15.0)
MCH: 26.7 pg (ref 26.0–34.0)
MCHC: 31.5 g/dL (ref 30.0–36.0)
MCV: 84.8 fL (ref 78.0–100.0)
PLATELETS: 260 10*3/uL (ref 150–400)
RBC: 3.41 MIL/uL — AB (ref 3.87–5.11)
RDW: 16.4 % — ABNORMAL HIGH (ref 11.5–15.5)
WBC: 12.2 10*3/uL — ABNORMAL HIGH (ref 4.0–10.5)

## 2016-07-18 LAB — CREATININE, SERUM
CREATININE: 0.69 mg/dL (ref 0.44–1.00)
GFR calc Af Amer: >60 mL/min (ref >60)
GFR calc non Af Amer: >60 mL/min (ref >60)

## 2016-07-18 LAB — VANCOMYCIN, TROUGH: VANCOMYCIN TR: 30 ug/mL — AB (ref 15–20)

## 2016-07-18 MED ORDER — IOPAMIDOL (ISOVUE-300) INJECTION 61%
INTRAVENOUS | Status: AC
  Filled 2016-07-18: qty 75

## 2016-07-18 MED ORDER — LIDOCAINE 5 % EX PTCH
1.0000 | MEDICATED_PATCH | CUTANEOUS | Status: DC
  Administered 2016-07-18 – 2016-07-30 (×4): 1 via TRANSDERMAL
  Filled 2016-07-18 (×11): qty 1

## 2016-07-18 MED ORDER — VANCOMYCIN HCL IN DEXTROSE 750-5 MG/150ML-% IV SOLN
750.0000 mg | Freq: Three times a day (TID) | INTRAVENOUS | Status: DC
  Administered 2016-07-18 – 2016-07-25 (×20): 750 mg via INTRAVENOUS
  Filled 2016-07-18 (×24): qty 150

## 2016-07-18 NOTE — Progress Notes (Signed)
Late Entry 230pm: Recevied call from Concepcion ElkJeffery Blackwell who identified himself as patient father. Stated that patient has court date tomorrow and [facility] is in the hospital and when she was admitted.  DD went in to speak with patient who stated that she does need the letter faxed and gave her permission.  Genelle BalVanessa Crawford SW made aware.  Jauan Wohl, Charlyne Qualeamara Johnson
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
## 2016-07-18 NOTE — Progress Notes (Signed)
Pharmacy Antibiotic Note  Anna CaterConstance Guerrieri is a 36 y.o. female with history of IV drug abuse released from jail 07/06/16 who presents with nausea, vomiting, and fever (tmax 104), and chills admitted on 07/07/2016 with sepsis.  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   The patient had a measured Vancomycin trough of 30 mcg/ml this morning however this was drawn 3 hours early. The patient's corrected Vancomycin trough is around 24 mcg/ml - will hold and reduce the Vancomycin dose and recheck another trough at steady state. Renal function remains stable, SCr 0.69, CrCl~80 ml/min.   Plan: 1. Reduce Vancomycin to 750 mg IV every 8 hours 2. Will continue to follow renal function, culture results, LOT, and antibiotic de-escalation plans   Height: 5\' 2"  (157.5 cm) Weight: 137 lb 12.8 oz (62.5 kg) IBW/kg (Calculated) : 50.1  Temp (24hrs), Avg:100.8 F (38.2 C), Min:99.6 F (37.6 C), Max:102.9 F (39.4 C)   Recent Labs Lab 07/12/16 0347 07/13/16 0315 07/14/16 0530 07/14/16 0841 07/14/16 1141 07/14/16 1300 07/16/16 0926 07/17/16 0541 07/18/16 0415  WBC 11.2*  --   --  16.2*  --   --  13.2* 13.0* 12.2*  CREATININE 0.66 0.65 0.57  --   --   --   --  0.63 0.69  LATICACIDVEN  --   --   --  1.8 1.1  --   --   --   --   VANCOTROUGH  --   --   --   --   --  24*  --   --  30*    Estimated Creatinine Clearance: 83.8 mL/min (by C-G formula based on SCr of 0.69 mg/dL).    No Known Allergies   Antimicrobials this admission:  Vanc 1/5 >>  Zosyn 1/5 >> 1/5  Dose adjustments this admission:  1/6 VT = 15 - on vanc 750 mg IV q 8 hrs 1/8 VT = 18 - on vanc 1g IV q 8 hrs 1/12 VT 24 - drawn early  1/16 measured VT @30  mcg/ml (only 5 hours after evening dose) so corrects to ~24 mcg/ml >> will reduce to 750 mg/8h  Microbiology results:  1/5 BCx: Staph aureus **1/5 BCID - MRSA 1/5 UCx: neg 1/5 MRSA PCR: (+) 1/6 resp panel pending 1/6 BCx x 2 > 1/2 MRSA 1/7 BCx x  2 > 1/2 staph aureus 1/9  Bcx ngtd 1/6 HCV Ab > 11, viral load not calc- spontaneous clearing per ID  Thank you for allowing pharmacy to be a part of this patient's care.  Georgina PillionElizabeth Jameelah Watts, PharmD, BCPS Clinical Pharmacist Pager: [phone] Clinical phone for 07/18/2016 from 7a-3:30p: [phone] If after 3:30p, please call main pharmacy at: [phone] 07/18/2016 10:03 AM

## 2016-07-18 NOTE — Progress Notes (Signed)
Pharmacy Antibiotic Note  Anna CaterConstance Colon is a 36 y.o. female admitted on 07/07/2016 with bacteremia and septic emboli/endocarditis.  Pharmacy has been consulted for vancomycin dosing.  Vanc trough well above goal with no change in renal function, last dose given 5h prior to lab draw.  Plan: Will check another level in 8hr to confirm and calculate kinetics.  Height: 5\' 2"  (157.5 cm) Weight: 137 lb 12.8 oz (62.5 kg) IBW/kg (Calculated) : 50.1  Temp (24hrs), Avg:100.8 F (38.2 C), Min:98.3 F (36.8 C), Max:102.9 F (39.4 C)   Recent Labs Lab 07/12/16 0347 07/13/16 0315 07/14/16 0530 07/14/16 0841 07/14/16 1141 07/14/16 1300 07/16/16 0926 07/17/16 0541 07/18/16 0415  WBC 11.2*  --   --  16.2*  --   --  13.2* 13.0* 12.2*  CREATININE 0.66 0.65 0.57  --   --   --   --  0.63 0.69  LATICACIDVEN  --   --   --  1.8 1.1  --   --   --   --   VANCOTROUGH  --   --   --   --   --  24*  --   --  30*    Estimated Creatinine Clearance: 83.8 mL/min (by C-G formula based on SCr of 0.69 mg/dL).    No Known Allergies    Thank you for allowing pharmacy to be a part of this patient's care.  Vernard GamblesVeronda Kameron Colon, PharmD, BCPS  07/18/2016 5:56 AM

## 2016-07-18 NOTE — Progress Notes (Signed)
Nutrition Follow-up  DOCUMENTATION CODES:   Not applicable  INTERVENTION:  Continue Ensure Enlive po BID, each supplement provides 350 kcal and 20 grams of protein.  Encourage adequate PO intake.   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   Meal completion has been 100% recently. Pt was asleep during time of visit and did not wake. Family member at bedside reports pt's appetite is good and she has been enjoying her food at meals. Pt currently has Ensure ordered with varied intake. RD to continue with current orders to aid in wound healing.   Labs and medications reviewed.   Diet Order:  Diet regular Room service appropriate? Yes; Fluid consistency: Thin  Skin:  Wound (see comment) (Stage 2 to sacrum)  Last BM:  1/13  Height:   Ht Readings from Last 1 Encounters:  07/15/16 '5\' 2"'$  (1.575 m)    Weight:   Wt Readings from Last 1 Encounters:  07/17/16 137 lb 12.8 oz (62.5 kg)    Ideal Body Weight:  50 kg  BMI:  Body mass index is 25.2 kg/m.  Estimated Nutritional Needs:   Kcal:  1700-1900  Protein:  80-90 grams  Fluid:  1.7 - 1.9 L/day  EDUCATION NEEDS:   No education needs identified at this time  Corrin Parker, MS, RD, LDN Pager # [phone] After hours/ weekend pager # [phone]

## 2016-07-18 NOTE — Progress Notes (Signed)
    [facility]   Reason for visit: Follow up on endocarditis  Interval History: refused CT, febrile, complaint of pain  Physical Exam: Constitutional:  Vitals:   07/18/16 0532 07/18/16 0943  BP: 128/88 129/89  Pulse: 95 95  Resp: 18 18  Temp: 99.7 F (37.6 C) 99.6 F (37.6 C)   patient in mild distress with pain Eyes: anicteric HENT: no thrush Respiratory: Normal respiratory effort; CTA B Cardiovascular: Tachy RR   Review of Systems: Constitutional: positive for fevers, chills and pain or negative for anorexia Respiratory: negative for cough Gastrointestinal: negative for diarrhea  Lab Results  Component Value Date   WBC 12.2 (H) 07/18/2016   HGB 9.1 (L) 07/18/2016   HCT 28.9 (L) 07/18/2016   MCV 84.8 07/18/2016   PLT 260 07/18/2016    Lab Results  Component Value Date   CREATININE 0.69 07/18/2016   BUN 9 07/17/2016   NA 132 (L) 07/17/2016   K 4.3 07/17/2016   CL 96 (L) 07/17/2016   CO2 28 07/17/2016    Lab Results  Component Value Date   ALT 19 07/08/2016   AST 31 07/08/2016   ALKPHOS 93 07/08/2016     Microbiology: Recent Results (from the past 240 hour(s))  Culture, blood (routine x 2)     Status: Abnormal   Collection Time: 07/09/16  6:24 PM  Result Value Ref Range Status   Specimen Description BLOOD LEFT FOREARM  Final   Special Requests IN PEDIATRIC BOTTLE 3CC  Final   Culture  Setup Time   Final    GRAM POSITIVE COCCI IN CLUSTERS IN PEDIATRIC BOTTLE CRITICAL RESULT CALLED TO, READ BACK BY AND VERIFIED WITH: ALaural Benes. [facility] [id number] 1120 BEAMJ    Culture (A)  Final    STAPHYLOCOCCUS AUREUS SUSCEPTIBILITIES PERFORMED ON PREVIOUS CULTURE WITHIN THE LAST 5 DAYS.    Report Status 07/11/2016 FINAL  Final  Culture, blood (routine x 2)     Status: None   Collection Time: 07/09/16  6:25 PM  Result Value Ref Range Status   Specimen Description BLOOD LEFT HAND  Final   Special Requests BOTTLES DRAWN AEROBIC ONLY 6CC   Final   Culture NO GROWTH 5 DAYS  Final   Report Status 07/15/2016 FINAL  Final  Culture, blood (routine x 2)     Status: None   Collection Time: 07/11/16 10:10 AM  Result Value Ref Range Status   Specimen Description BLOOD LEFT ANTECUBITAL  Final   Special Requests BOTTLES DRAWN AEROBIC ONLY 5CC  Final   Culture NO GROWTH 5 DAYS  Final   Report Status 07/16/2016 FINAL  Final  Culture, blood (routine x 2)     Status: None   Collection Time: 07/11/16 10:15 AM  Result Value Ref Range Status   Specimen Description BLOOD LEFT ARM  Final   Special Requests BOTTLES DRAWN AEROBIC ONLY 5CC  Final   Culture NO GROWTH 5 DAYS  Final   Report Status 07/16/2016 FINAL  Final    Impression/Plan:  1. Fever - concern for evolving empyema/abscess.  Refused CT but father at bedside discussed with her and agreeable if extra pain medications can be given.   2.  MRSA bacteremia with TV endocarditis - will need prolonged treatment.   3.  Substance abuse - will need post hospital substance abuse care.

## 2016-07-19 ENCOUNTER — Inpatient Hospital Stay (HOSPITAL_COMMUNITY): Payer: Self-pay

## 2016-07-19 ENCOUNTER — Encounter (HOSPITAL_COMMUNITY): Payer: Self-pay | Admitting: Cardiothoracic Surgery

## 2016-07-19 DIAGNOSIS — I330 Acute and subacute infective endocarditis: Secondary | ICD-10-CM

## 2016-07-19 DIAGNOSIS — J90 Pleural effusion, not elsewhere classified: Secondary | ICD-10-CM

## 2016-07-19 LAB — CREATININE, SERUM
Creatinine, Ser: 0.7 mg/dL (ref 0.44–1.00)
GFR calc Af Amer: >60 mL/min (ref >60)

## 2016-07-19 LAB — OCCULT BLOOD X 1 CARD TO LAB, STOOL: FECAL OCCULT BLD: NEGATIVE

## 2016-07-19 MED ORDER — IOPAMIDOL (ISOVUE-300) INJECTION 61%
INTRAVENOUS | Status: AC
  Administered 2016-07-19: 75 mL
  Filled 2016-07-19: qty 75

## 2016-07-19 MED ORDER — HYDROMORPHONE HCL 1 MG/ML IJ SOLN
1.5000 mg | Freq: Four times a day (QID) | INTRAMUSCULAR | Status: DC | PRN
  Administered 2016-07-19 – 2016-07-28 (×31): 1.5 mg via INTRAVENOUS
  Filled 2016-07-19 (×33): qty 2

## 2016-07-19 MED ORDER — TRIAMCINOLONE ACETONIDE 0.5 % EX OINT
TOPICAL_OINTMENT | Freq: Two times a day (BID) | CUTANEOUS | Status: DC
  Administered 2016-07-19 – 2016-07-28 (×13): via TOPICAL
  Administered 2016-07-29: 1 via TOPICAL
  Administered 2016-07-30 – 2016-08-08 (×14): via TOPICAL
  Filled 2016-07-19 (×3): qty 15

## 2016-07-19 NOTE — Progress Notes (Signed)
Subjective: Patient examined, chest CT scans and transthoracic echocardiogram personally reviewed and counseled with patient and father  36 year old Caucasian female smoker with history of both heroin and cocaine abuse who was recently released from jail admitted to this hospital over 10 days ago with fever cough and malaise. She was found have positive blood cultures of MRSA [facility] she was treated. Her blood cultures have subsequently become negative. Echocardiogram shows mild vegetations of the tricuspid valve without structural abnormality or significant tricuspid regurgitation. There is no significant pericardial effusion and both ventricles have preserved systolic function. The patient has chest pain and has had bilateral septic pulmonary emboli which are showing signs of response to antibiotics on serial exams. On her most recent  CT exam she has developed a right greater than left pleural effusion which does not appear enhanced and low risk for empyema. Her fever curve is trending down and her white count is mildly elevated. I would recommend right thoracentesis and culture the pleural fluid. If culture positive the patient with need a VATS drainage.  The patient also has a dental abscess seen on head CT scan, severe pain and tenderness of the right shoulder and low back pain.  The patient has hepatitis C and is HIV negative. Objective: Vital signs in last 24 hours: Temp:  [98.3 F (36.8 C)-103.1 F (39.5 C)] 101.7 F (38.7 C) (01/17 1702) Pulse Rate:  [94-113] 107 (01/17 1702) Cardiac Rhythm: Normal sinus rhythm (01/17 0700) Resp:  [17-20] 17 (01/17 1702) BP: (127-145)/(83-98) 135/90 (01/17 1702) SpO2:  [97 %-99 %] 98 % (01/17 1702)  Hemodynamic parameters for last 24 hours:  sinus rhythm temp to 100.2 down from 103  Intake/Output from previous day: 01/16 0701 - 01/17 0700 In: 720 [P.O.:720] Out: 1500 [Urine:1500] Intake/Output this shift: Total I/O In: 480 [P.O.:480] Out: 0        Physical Exam  General: Chronically ill frail-appearing female appears older than her stated age HEENT: Normocephalic pupils equal , dentition very poor Neck: Supple without JVD, adenopathy, or bruit Chest: Scattered rhonchi but without tenderness or  deformity Cardiovascular: Regular rate and rhythm, no murmur, no gallop, peripheral pulses             palpable in all extremities Abdomen:  Soft, nontender, no palpable mass or organomegaly Extremities: Warm, well-perfused, no clubbing cyanosis edema or tenderness,              no venous stasis changes of the legs Rectal/GU: Deferred Neuro: Grossly non--focal and symmetrical throughout Skin: Rash over left arm without ulceration probable dermatitis    Lab Results:  Recent Labs  07/17/16 0541 07/18/16 0415  WBC 13.0* 12.2*  HGB 8.6* 9.1*  HCT 26.5* 28.9*  PLT 299 260   BMET:  Recent Labs  07/17/16 0541 07/18/16 0415 07/19/16 1411  NA 132*  --   --   K 4.3  --   --   CL 96*  --   --   CO2 28  --   --   GLUCOSE 94  --   --   BUN 9  --   --   CREATININE 0.63 0.69 0.70  CALCIUM 8.3*  --   --     PT/INR: No results for input(s): LABPROT, INR in the last 72 hours. ABG No results found for: PHART, HCO3, TCO2, ACIDBASEDEF, O2SAT CBG (last 3)  No results for input(s): GLUCAP in the last 72 hours.  Assessment/Plan: S/P  MRSA bacteremia with positive blood cultures, tricuspid  valve endocarditis without structural damage or significant regurgitation and bilateral pulmonary emboli  Bilateral pleural effusions right greater than left , this does not appear to be empyema but will obtain ultrasound directed right thoracentesis and culture of fluid. If culture is negative she does not need VATS drainage but will  needscontinued antibiotics improved nutrition-protein and time. No indication for tricuspid valve surgery at this time. Will follow   LOS: 12 days    Kathlee Nationseter Van Trigt III 07/19/2016

## 2016-07-19 NOTE — Progress Notes (Signed)
[facility]   Reason for visit: Follow up on MRSA TV endocarditis  Interval History: she has remained febrile and CT done today and bilateral pleural effusions noted; complaint of pain  CT independently reviewed and effusions appear significant, left slightly larger than right  Physical Exam: Constitutional:  Vitals:   07/19/16 0924 07/19/16 1138  BP: (!) 137/97   Pulse: (!) 107   Resp: 18   Temp: (!) 100.7 F (38.2 C) (!) 100.8 F (38.2 C)   patient appears in NAD Eyes: anicteric HENT: no thrush Respiratory: Normal respiratory effort; CTA B Cardiovascular: RRR GI: soft, nt, nd  Review of Systems: Constitutional: positive for pain or negative for anorexia Respiratory: negative for cough Gastrointestinal: negative for diarrhea Integument/breast: negative for rash  Lab Results  Component Value Date   WBC 12.2 (H) 07/18/2016   HGB 9.1 (L) 07/18/2016   HCT 28.9 (L) 07/18/2016   MCV 84.8 07/18/2016   PLT 260 07/18/2016    Lab Results  Component Value Date   CREATININE 0.69 07/18/2016   BUN 9 07/17/2016   NA 132 (L) 07/17/2016   K 4.3 07/17/2016   CL 96 (L) 07/17/2016   CO2 28 07/17/2016    Lab Results  Component Value Date   ALT 19 07/08/2016   AST 31 07/08/2016   ALKPHOS 93 07/08/2016     Microbiology: Recent Results (from the past 240 hour(s))  Culture, blood (routine x 2)     Status: Abnormal   Collection Time: 07/09/16  6:24 PM  Result Value Ref Range Status   Specimen Description BLOOD LEFT FOREARM  Final   Special Requests IN PEDIATRIC BOTTLE 3CC  Final   Culture  Setup Time   Final    GRAM POSITIVE COCCI IN CLUSTERS IN PEDIATRIC BOTTLE CRITICAL RESULT CALLED TO, READ BACK BY AND VERIFIED WITH: ALaural Benes [facility] [id number] 1120 BEAMJ    Culture (A)  Final    STAPHYLOCOCCUS AUREUS SUSCEPTIBILITIES PERFORMED ON PREVIOUS CULTURE WITHIN THE LAST 5 DAYS.    Report Status 07/11/2016 FINAL  Final  Culture, blood (routine x  2)     Status: None   Collection Time: 07/09/16  6:25 PM  Result Value Ref Range Status   Specimen Description BLOOD LEFT HAND  Final   Special Requests BOTTLES DRAWN AEROBIC ONLY 6CC  Final   Culture NO GROWTH 5 DAYS  Final   Report Status 07/15/2016 FINAL  Final  Culture, blood (routine x 2)     Status: None   Collection Time: 07/11/16 10:10 AM  Result Value Ref Range Status   Specimen Description BLOOD LEFT ANTECUBITAL  Final   Special Requests BOTTLES DRAWN AEROBIC ONLY 5CC  Final   Culture NO GROWTH 5 DAYS  Final   Report Status 07/16/2016 FINAL  Final  Culture, blood (routine x 2)     Status: None   Collection Time: 07/11/16 10:15 AM  Result Value Ref Range Status   Specimen Description BLOOD LEFT ARM  Final   Special Requests BOTTLES DRAWN AEROBIC ONLY 5CC  Final   Culture NO GROWTH 5 DAYS  Final   Report Status 07/16/2016 FINAL  Final    Impression/Plan:  1. MRSA bacteremia with TV endocarditis - will need prolonged treatment with IV vancomycin.  2.  Pleural effusion - ? Empyema, likely cause of persistent fever and need to consider thoracic surgery involvement for ? Chest tubes.    3.  Fever - persistent and likely  secondary to #2.
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
## 2016-07-20 ENCOUNTER — Inpatient Hospital Stay (HOSPITAL_COMMUNITY): Payer: Self-pay

## 2016-07-20 LAB — VANCOMYCIN, TROUGH: Vancomycin Tr: 16 ug/mL (ref 15–20)

## 2016-07-20 LAB — GRAM STAIN

## 2016-07-20 MED ORDER — OXYCODONE HCL 5 MG PO TABS
10.0000 mg | ORAL_TABLET | Freq: Once | ORAL | Status: AC
  Administered 2016-07-20: 10 mg via ORAL
  Filled 2016-07-20: qty 2

## 2016-07-20 MED ORDER — LIDOCAINE HCL 1 % IJ SOLN
INTRAMUSCULAR | Status: AC
  Filled 2016-07-20: qty 20

## 2016-07-20 NOTE — Progress Notes (Signed)
PROGRESS NOTE                                                                                                                                                                                                             Patient Demographics:    Anna CaterConstance Colon, is a 36 y.o. female, DOB - Nov 10, 1980, ZOX:[id number]  Admit date - 07/07/2016   Admitting Physician Eduard ClosArshad N Kakrakandy, MD  Outpatient Primary MD for the patient is No PCP Per Patient  LOS - 13  Outpatient Specialists:None  Chief Complaint  Patient presents with  . Vomiting       Brief Narrative   36 year old female with history of asthma, hep C, IV drug use presented to the ED with weakness, fatigue and cough of 2 week duration. Patient had a arrest warrant and was taken [facility] [place]. She was seen by a physician there who started on antibiotics for her illness. See was that out of the jail that she could go to the hospital and then presented to the ED. 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 Patient reported subjective fever with chills and nausea. She was tachycardic and tachypneic in the ED, afebrile. Chest x-ray concerning for septic emboli. CT angiogram of the chest showed bilateral cavitary nodules consistent with septic pulmonary emboli. Also showed findings concerning for bilateral pyelonephritis. Sepsis pathway initiated and admitted to stepdown unit. Blood cultures growing MRSA.    Subjective:   Complains of right shoulder pain with difficulty moving it. Still having temperature spikes.  Assessment  & Plan :    Principal Problem:   MRSA bacteremia With tricuspid valve endocarditis, septic pulmonary emboli and possible septic renal emboli Secondary to IV drug use. Sepsis was improving but again became febrile. Chest x-ray showing worsening pneumonia. Check CT of the chest with contrast. -empiric IV vancomycin.   PICC line placed for poor IV access. Repeat blood culture negative For growth. Appreciate ID consult. . Will determine duration of antibiotic. Continue telemetry monitoring.  Active Problems:   Sepsis (HCC) Again having fever spikes. Follow CT of the chest reports pleural effusions question whether this is secondary to infectious etiology. Continue empiric antibiotic.  Pleural effusions - Consulted CT surgery plan is for thoracentesis and based on results there are further recommendations.  Shortness of breath  with pleural effusion and symptomatic anemia Received IV Lasix and 1 unit PRBC. Hemoglobin improved. Culture pending.  Generalized pain Possibly due to septic emboli and? Withdrawal from narcotics. Discontinued NSAIDs given anemia.Marland Kitchen  on oxycodone IR and low-dose Dilaudid. Added clonidine with good response.  Right shoulder pain Reports ongoing since admission (says the police may have dislocated her shoulder when they handcuffed her). Order Lidoderm patch. X-ray of the shoulder unremarkable.  IVDU (intravenous drug user) Patient plans to quit. Father concerned about visitors coming to meet her and thinks that they will encourage her to use drugs.   Tobacco abuse Counseled on cessation. nicotine patch.    ETOH abuse No signs of withdrawal. Monitor on CIWA. Added low dose clonidine    Thrombocytopenia (HCC) On admission secondary to sepsis. Resolved.    Endocarditis of tricuspid valve Continue IV antibiotic. Duration per ID. No signs of acute heart failure.  Iron deficiency anemia Added supplement   Hypophosphatemia Replenished. Check phosphorus in the morning.  Hepatitis C Secondary to IV drug use. Viral load negative. Suggests  spontaneous clearing  Sacral pressure ulcer Wound care consulted   Code Status : Full code  Family Communication  : father at bedside.  Disposition Plan  :  Currently inpatient. Father drives a truck and usually out of home. He is  worried that if he takes her home she will be by herself and will again have her boyfriend and other people coming at home and she will be back on drugs. Reports that this has happened before. He is hoping if she will qualify to go to skilled nursing facility. Social work following. Plan on discharge if patient remains afebrile.  Barriers For Discharge : Active symptoms  Consults  : Infectious disease  Procedures  :  CT angiogram of the chest CT abdomen and pelvis 2-D echo  DVT Prophylaxis  :  Lovenox   Lab Results  Component Value Date   PLT 260 07/18/2016    Antibiotics  :   Anti-infectives    Start     Dose/Rate Route Frequency Ordered Stop   07/18/16 1200  vancomycin (VANCOCIN) IVPB 750 mg/150 ml premix     750 mg 150 mL/hr over 60 Minutes Intravenous Every 8 hours 07/18/16 1002     07/08/16 1515  vancomycin (VANCOCIN) IVPB 1000 mg/200 mL premix  Status:  Discontinued     1,000 mg 200 mL/hr over 60 Minutes Intravenous Every 8 hours 07/08/16 1501 07/18/16 0556   07/07/16 2200  piperacillin-tazobactam (ZOSYN) IVPB 3.375 g  Status:  Discontinued     3.375 g 12.5 mL/hr over 240 Minutes Intravenous Every 8 hours 07/07/16 1605 07/07/16 1754   07/07/16 1615  vancomycin (VANCOCIN) IVPB 750 mg/150 ml premix  Status:  Discontinued     750 mg 150 mL/hr over 60 Minutes Intravenous Every 8 hours 07/07/16 1605 07/08/16 1501   07/07/16 0813  piperacillin-tazobactam (ZOSYN) IVPB 3.375 g     3.375 g 100 mL/hr over 30 Minutes Intravenous  Once 07/07/16 0644 07/07/16 0839   07/07/16 0215  piperacillin-tazobactam (ZOSYN) IVPB 3.375 g     3.375 g 100 mL/hr over 30 Minutes Intravenous  Once 07/07/16 0213 07/07/16 0313   07/07/16 0215  vancomycin (VANCOCIN) IVPB 1000 mg/200 mL premix     1,000 mg 200 mL/hr over 60 Minutes Intravenous  Once 07/07/16 0213 07/07/16 0343        Objective:   Vitals:   07/19/16 2037 07/20/16 0448 07/20/16 1006 07/20/16 1035  BP:  (!) 151/95 (!) 122/96  123/86  Pulse:  (!) 111    Resp:  18    Temp:  (!) 102.9 F (39.4 C)    TempSrc:  Oral    SpO2:  95%    Weight: 54.4 kg (120 lb)     Height: 5\' 2"  (1.575 m)       Wt Readings from Last 3 Encounters:  07/19/16 54.4 kg (120 lb)     Intake/Output Summary (Last 24 hours) at 07/20/16 1609 Last data filed at 07/20/16 1448  Gross per 24 hour  Intake             1590 ml  Output              300 ml  Net             1290 ml     Physical Exam  Gen: alert and awake, in nad. Reports pain HEENT:  Pallor present, moist mucosa, supple neck Chest: Scattered rhonchi bilaterally CVS:S1&S2 regular, no murmurs  GI: soft, ND , nontender Musculoskeletal: warm, no edema CNS: Alert and oriented    Data Review:    CBC  Recent Labs Lab 07/14/16 0841 07/16/16 0926 07/17/16 0541 07/18/16 0415  WBC 16.2* 13.2* 13.0* 12.2*  HGB 8.5* 6.6* 8.6* 9.1*  HCT 26.3* 20.7* 26.5* 28.9*  PLT 305 244 299 260  MCV 83.0 85.2 84.1 84.8  MCH 26.8 27.2 27.3 26.7  MCHC 32.3 31.9 32.5 31.5  RDW 15.9* 16.9* 16.2* 16.4*    Chemistries   Recent Labs Lab 07/14/16 0530 07/17/16 0541 07/18/16 0415 07/19/16 1411  NA  --  132*  --   --   K  --  4.3  --   --   CL  --  96*  --   --   CO2  --  28  --   --   GLUCOSE  --  94  --   --   BUN  --  9  --   --   CREATININE 0.57 0.63 0.69 0.70  CALCIUM  --  8.3*  --   --    ------------------------------------------------------------------------------------------------------------------ No results for input(s): CHOL, HDL, LDLCALC, TRIG, CHOLHDL, LDLDIRECT in the last 72 hours.  No results found for: HGBA1C ------------------------------------------------------------------------------------------------------------------ No results for input(s): TSH, T4TOTAL, T3FREE, THYROIDAB in the last 72 hours.  Invalid input(s): FREET3 ------------------------------------------------------------------------------------------------------------------ No results for  input(s): VITAMINB12, FOLATE, FERRITIN, TIBC, IRON, RETICCTPCT in the last 72 hours.  Coagulation profile No results for input(s): INR, PROTIME in the last 168 hours.  No results for input(s): DDIMER in the last 72 hours.  Cardiac Enzymes No results for input(s): CKMB, TROPONINI, MYOGLOBIN in the last 168 hours.  Invalid input(s): CK ------------------------------------------------------------------------------------------------------------------ No results found for: BNP  Inpatient Medications  Scheduled Meds: . cloNIDine  0.1 mg Oral BID  . enoxaparin (LOVENOX) injection  40 mg Subcutaneous Q24H  . feeding supplement (ENSURE ENLIVE)  237 mL Oral BID BM  . ferrous sulfate  325 mg Oral BID WC  . folic acid  1 mg Oral Daily  . lidocaine  1 patch Transdermal Q24H  . lidocaine      . multivitamin with minerals  1 tablet Oral Daily  . nicotine  21 mg Transdermal Daily  . sodium chloride flush  10-40 mL Intracatheter Q12H  . thiamine  100 mg Oral Daily   Or  . thiamine  100 mg Intravenous Daily  . triamcinolone ointment  Topical BID  . vancomycin  750 mg Intravenous Q8H   Continuous Infusions:  PRN Meds:.acetaminophen **OR** acetaminophen, albuterol, hydrALAZINE, HYDROmorphone (DILAUDID) injection, ondansetron **OR** ondansetron (ZOFRAN) IV, oxyCODONE, polyethylene glycol, sodium chloride flush, zolpidem  Micro Results Recent Results (from the past 240 hour(s))  Culture, blood (routine x 2)     Status: None   Collection Time: 07/11/16 10:10 AM  Result Value Ref Range Status   Specimen Description BLOOD LEFT ANTECUBITAL  Final   Special Requests BOTTLES DRAWN AEROBIC ONLY 5CC  Final   Culture NO GROWTH 5 DAYS  Final   Report Status 07/16/2016 FINAL  Final  Culture, blood (routine x 2)     Status: None   Collection Time: 07/11/16 10:15 AM  Result Value Ref Range Status   Specimen Description BLOOD LEFT ARM  Final   Special Requests BOTTLES DRAWN AEROBIC ONLY 5CC  Final    Culture NO GROWTH 5 DAYS  Final   Report Status 07/16/2016 FINAL  Final  Culture, body fluid-bottle     Status: None (Preliminary result)   Collection Time: 07/20/16 11:27 AM  Result Value Ref Range Status   Specimen Description PERITONEAL  Final   Special Requests NONE  Final   Culture NO GROWTH <12 HOURS  Final   Report Status PENDING  Incomplete  Gram stain     Status: None (Preliminary result)   Collection Time: 07/20/16 11:27 AM  Result Value Ref Range Status   Specimen Description PERITONEAL  Final   Special Requests NONE  Final   Gram Stain   Final    FEW WBC PRESENT,BOTH PMN AND MONONUCLEAR NO ORGANISMS SEEN    Report Status PENDING  Incomplete    Radiology Reports Dg Orthopantogram  Result Date: 07/20/2016 CLINICAL DATA:  Sepsis.  Tooth abscess. EXAM: ORTHOPANTOGRAM/PANORAMIC COMPARISON:  CT 02/18/2011. FINDINGS: No acute bony abnormality identified. No evidence of fracture or dislocation. Periapical lucencies noted throughout the remaining teeth along the mandible suggesting dental disease. No prominent lytic lesion noted. No fracture . If further evaluation is needed maxillofacial CT can be obtained. IMPRESSION: Muscle mild periapical lucencies are noted throughout the remaining teeth of the lower mandible. No prominent lytic lesion noted. Electronically Signed   By: Maisie Fus  Register   On: 07/20/2016 11:19   Dg Chest 1 View  Result Date: 07/20/2016 CLINICAL DATA:  Status post right thoracentesis. EXAM: CHEST 1 VIEW COMPARISON:  07/16/2016 FINDINGS: Right arm PICC line tip is in the projection of the cavoatrial junction. Normal heart size. Bilateral pleural effusions are identified, left greater than right. There is mild interstitial edema. Persistent bilateral mid lung and lower lobe opacities. IMPRESSION: 1. No change in bilateral pleural effusions and bilateral lung opacities. Electronically Signed   By: Signa Kell M.D.   On: 07/20/2016 11:08   Dg Chest 2 View  Result  Date: 07/13/2016 CLINICAL DATA:  Pneumonia EXAM: CHEST  2 VIEW COMPARISON:  07/08/2016 FINDINGS: Bilateral pleural effusions, left greater than right with lower lobe airspace opacities, also worse on the left. This is improved somewhat on the right, not significantly changed on the left. Right PICC line is in place with the tip at the cavoatrial junction. Low lung volumes. Heart is normal size. IMPRESSION: Bilateral lower lobe airspace opacities, left greater than right. Some improvement on the right since prior study with no change on the left. Small bilateral effusions. Electronically Signed   By: Charlett Nose M.D.   On: 07/13/2016 13:19   Dg Chest 2  View  Result Date: 07/07/2016 CLINICAL DATA:  Nausea vomiting and diarrhea for 8 days EXAM: CHEST  2 VIEW COMPARISON:  None. FINDINGS: Numerous ill-defined nodules throughout both lungs, many cavitary. No confluent consolidation. No large effusion. Hilar, mediastinal and cardiac contours are unremarkable. IMPRESSION: Innumerable nodules, many cavitary. Septic emboli are a leading consideration. Noninfectious inflammatory process or neoplastic process are less likely possibilities. Electronically Signed   By: Ellery Plunk M.D.   On: 07/07/2016 01:55   Dg Shoulder Right  Result Date: 07/18/2016 CLINICAL DATA:  Right shoulder pain with fever and sepsis. EXAM: RIGHT SHOULDER - 2+ VIEW COMPARISON:  Chest x-ray 07/16/2016 FINDINGS: Right-sided PICC line unchanged. Bones, joint spaces and soft tissues over the right shoulder are within normal. Stable changes within the right lung. IMPRESSION: Unremarkable right shoulder. Electronically Signed   By: Elberta Fortis M.D.   On: 07/18/2016 10:49   Ct Chest W Contrast  Result Date: 07/19/2016 CLINICAL DATA:  Pneumonia, cough, fever, septic embolism, history hepatitis-C, asthma, IV drug abuse, followup EXAM: CT CHEST WITH CONTRAST TECHNIQUE: Multidetector CT imaging of the chest was performed during intravenous  contrast administration. Sagittal and coronal MPR images reconstructed from axial data set. CONTRAST:  75 mL ISOVUE-300 IOPAMIDOL (ISOVUE-300) INJECTION 61% IV COMPARISON:  07/07/2016 FINDINGS: Cardiovascular: Vascular structures grossly patent on nondedicated exam. RIGHT arm PICC line with tip in SVC near cavoatrial junction. No pericardial effusion. Mediastinum/Nodes: Few scattered normal size mediastinal lymph nodes. No definite thoracic adenopathy. Esophagus unremarkable. Base of cervical region normal appearance. Lungs/Pleura: Multiple cavitary nodules are seen within both lungs consistent with septic emboli. Number of nodules has decreased since the previous exam, with some of the smaller nodule seen previously no longer identified. Remaining nodules are stable to decreased in sizes ; RIGHT upper lobe nodule measured previously at 21 mm now measures 16 mm image 32 and previously measured LEFT upper lobe nodule of 23 mm measures 16 mm at present. BILATERAL pleural effusions with compressive atelectasis of the lower lobes. No pneumothorax. Upper Abdomen: Beam hardening artifacts from patient's RIGHT arm. Spleen appears enlarged. No additional upper abdominal abnormalities seen. Musculoskeletal: No acute osseous findings. IMPRESSION: New BILATERAL pleural effusions with compressive atelectasis of the lower lobes. Cavitary lung nodules bilaterally compatible with septic emboli, decreased in number with remaining nodules stable to decreased in sizes since the previous exam. Electronically Signed   By: Ulyses Southward M.D.   On: 07/19/2016 13:01   Ct Angio Chest Pe W And/or Wo Contrast  Result Date: 07/07/2016 CLINICAL DATA:  Short of breath fever, tachycardia. IV drug abuse. Leukocytosis. EXAM: CT ANGIOGRAPHY CHEST CT ABDOMEN AND PELVIS WITH CONTRAST TECHNIQUE: Multidetector CT imaging of the chest was performed using the standard protocol during bolus administration of intravenous contrast. Multiplanar CT image  reconstructions and MIPs were obtained to evaluate the vascular anatomy. Multidetector CT imaging of the abdomen and pelvis was performed using the standard protocol during bolus administration of intravenous contrast. CONTRAST:  100 mL Isovue COMPARISON:  Chest radiograph 07/07/2016 FINDINGS: CTA CHEST FINDINGS Cardiovascular: No filling defects within the pulmonary to suggest acute pulmonary embolism. No acute findings of the aorta great vessels. No pericardial effusion. Heart is grossly normal. Mediastinum/Nodes: No axillary or supraclavicular adenopathy. No mediastinal hilar adenopathy. Ill-defined peribronchial thickening in the hila. Lungs/Pleura: Bilateral thick-walled cavitary nodules in the upper and lower lobes. Consolidated airspace disease in the lower lobes. Approximately 30 nodules per lung. Example nodule cavitary nodule in the RIGHT upper lobe measures 21 mm (image  27, series 6). Example cavitary nodule in the LEFT upper lobe measures 23 mm on image 30 series 6. Musculoskeletal: No aggressive osseous lesion. Review of the MIP images confirms the above findings. CT ABDOMEN and PELVIS FINDINGS Hepatobiliary: Liver is enlarged. No focal hepatic lesion. Periportal edema noted. Portal veins are patent. Gallbladder wall is edematous and thickened to 6 mm. The gallbladder lumen is contracted. Common bile duct normal caliber. Pancreas: No pancreatic inflammation or duct dilatation Spleen: Spleen is enlarged measuring 14.7 by 7.5 x 12.4 cm (volume = 720 cm^3). Low-density lesion in the spleen measuring 12 mm does not have simple fluid attenuation. Adrenals/Urinary Tract: Adrenal glands are normal. There is heterogeneous enhancement of the kidneys on the cortical phase imaging (image 32, series 2 for example). On the delayed pyelogram phase imaging the kidneys have a striated appearance (image 14 series 17). No renal obstruction. Bladder normal Stomach/Bowel: Stomach, small bowel, appendix, and cecum are  normal. The Colon and rectosigmoid Colon are normal. Vascular/Lymphatic: Abdominal aorta is normal caliber. No abdominal lymphadenopathy. Reproductive: Uterus and ovaries grossly normal Other: Smaller free fluid in the posterior pelvis Musculoskeletal: No aggressive osseous lesion. Review of the MIP images confirms the above findings. IMPRESSION: Chest Impression: 1. Bilateral thick-walled cavitary nodules consistent with SEPTIC PULMONARY EMBOLI in this population (young patient with IV drug use). 2. Bibasilar consolidation consistent with pneumonia Abdomen / Pelvis Impression: 1. 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 2. Hepatosplenomegaly. Low-density lesion in the spleen is indeterminate. 3. Small volume of intraperitoneal free fluid and periportal edema may relate to hepatic dysfunction. Electronically Signed   By: Genevive Bi M.D.   On: 07/07/2016 09:29   Ct Abdomen Pelvis W Contrast  Result Date: 07/07/2016 CLINICAL DATA:  Short of breath fever, tachycardia. IV drug abuse. Leukocytosis. EXAM: CT ANGIOGRAPHY CHEST CT ABDOMEN AND PELVIS WITH CONTRAST TECHNIQUE: Multidetector CT imaging of the chest was performed using the standard protocol during bolus administration of intravenous contrast. Multiplanar CT image reconstructions and MIPs were obtained to evaluate the vascular anatomy. Multidetector CT imaging of the abdomen and pelvis was performed using the standard protocol during bolus administration of intravenous contrast. CONTRAST:  100 mL Isovue COMPARISON:  Chest radiograph 07/07/2016 FINDINGS: CTA CHEST FINDINGS Cardiovascular: No filling defects within the pulmonary to suggest acute pulmonary embolism. No acute findings of the aorta great vessels. No pericardial effusion. Heart is grossly normal. Mediastinum/Nodes: No axillary or supraclavicular adenopathy. No mediastinal hilar adenopathy. Ill-defined peribronchial thickening in the hila. Lungs/Pleura:  Bilateral thick-walled cavitary nodules in the upper and lower lobes. Consolidated airspace disease in the lower lobes. Approximately 30 nodules per lung. Example nodule cavitary nodule in the RIGHT upper lobe measures 21 mm (image 27, series 6). Example cavitary nodule in the LEFT upper lobe measures 23 mm on image 30 series 6. Musculoskeletal: No aggressive osseous lesion. Review of the MIP images confirms the above findings. CT ABDOMEN and PELVIS FINDINGS Hepatobiliary: Liver is enlarged. No focal hepatic lesion. Periportal edema noted. Portal veins are patent. Gallbladder wall is edematous and thickened to 6 mm. The gallbladder lumen is contracted. Common bile duct normal caliber. Pancreas: No pancreatic inflammation or duct dilatation Spleen: Spleen is enlarged measuring 14.7 by 7.5 x 12.4 cm (volume = 720 cm^3). Low-density lesion in the spleen measuring 12 mm does not have simple fluid attenuation. Adrenals/Urinary Tract: Adrenal glands are normal. There is heterogeneous enhancement of the kidneys on the cortical phase imaging (image 32, series 2 for example). On the  delayed pyelogram phase imaging the kidneys have a striated appearance (image 14 series 17). No renal obstruction. Bladder normal Stomach/Bowel: Stomach, small bowel, appendix, and cecum are normal. The Colon and rectosigmoid Colon are normal. Vascular/Lymphatic: Abdominal aorta is normal caliber. No abdominal lymphadenopathy. Reproductive: Uterus and ovaries grossly normal Other: Smaller free fluid in the posterior pelvis Musculoskeletal: No aggressive osseous lesion. Review of the MIP images confirms the above findings. IMPRESSION: Chest Impression: 1. Bilateral thick-walled cavitary nodules consistent with SEPTIC PULMONARY EMBOLI in this population (young patient with IV drug use). 2. Bibasilar consolidation consistent with pneumonia Abdomen / Pelvis Impression: 1. 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 2.  Hepatosplenomegaly. Low-density lesion in the spleen is indeterminate. 3. Small volume of intraperitoneal free fluid and periportal edema may relate to hepatic dysfunction. Electronically Signed   By: Genevive Bi M.D.   On: 07/07/2016 09:29   Dg Chest Port 1 View  Result Date: 07/16/2016 CLINICAL DATA:  MRSA bacteremia with septic pulmonary emboli. EXAM: PORTABLE CHEST 1 VIEW COMPARISON:  07/13/2016. FINDINGS: Right PICC tip at the superior cavoatrial junction. Normal sized heart. Mildly increased bilateral airspace opacity and bilateral pleural fluid. Unremarkable bones. IMPRESSION: Mildly increased bilateral pneumonia and pleural fluid. Electronically Signed   By: Beckie Salts M.D.   On: 07/16/2016 09:53   Dg Chest Port 1 View  Result Date: 07/08/2016 CLINICAL DATA:  Patient admitted yesterday with MR SA bacteremia. Possible aspiration today. EXAM: PORTABLE CHEST 1 VIEW COMPARISON:  CT chest and PA and lateral chest 07/07/2014. FINDINGS: Multiple nodular opacities bilaterally again seen. There is increased bibasilar airspace disease. No pneumothorax. Small pleural effusions noted. Heart size is normal. IMPRESSION: Increased bibasilar airspace disease could be due to atelectasis, aspiration and/or pneumonia. Multifocal nodular opacities compatible with septic emboli as seen on chest CT yesterday. Electronically Signed   By: Drusilla Kanner M.D.   On: 07/08/2016 12:07   US Thoracentesis Asp Pleural Space W/img Guide  Result Date: 07/20/2016 INDICATION: Pneumonia, cough, fever, septic embolism, history of hepatitis-C, asthma, IV drug abuse, bilateral pleural effusions seen on CT scan. Request for diagnostic and therapeutic thoracentesis. EXAM: ULTRASOUND GUIDED RIGHT THORACENTESIS MEDICATIONS: 1% Lidocaine = 10 mL. COMPLICATIONS: None immediate. PROCEDURE: An ultrasound guided thoracentesis was thoroughly discussed with the patient and questions answered. The benefits, risks, alternatives and  complications were also discussed. The patient understands and wishes to proceed with the procedure. Written consent was obtained. Ultrasound was performed to localize and mark an adequate pocket of fluid in the right chest. The effusion appeared loculated. The area was then prepped and draped in the normal sterile fashion. 1% Lidocaine was used for local anesthesia. Under ultrasound guidance a 6 Fr Safe-T-Centesis catheter was introduced. Thoracentesis was performed. The catheter was removed and a dressing applied. FINDINGS: A total of approximately 160 mL of blood tinged fluid was removed. Samples were sent to the laboratory as requested by the clinical team. IMPRESSION: Successful ultrasound guided right thoracentesis yielding only 160 mL of pleural fluid due to loculations. Read by:  Corrin Parker, PA-C Electronically Signed   By: Richarda Overlie M.D.   On: 07/20/2016 10:38    Time Spent in minutes 35   Penny Pia M.D on 07/20/2016 at 4:09 PM  Between 7am to 7pm - Pager - [phone]  After 7pm go to [email or web address] - password [facility]  [facility] -  Office  [phone]

## 2016-07-20 NOTE — Procedures (Signed)
PROCEDURE SUMMARY:  Successful US guided right thoracentesis. Fluid appeared loculated on US Yielded 160 mL of blood tinged fluid. Pt tolerated procedure well. No immediate complications.  Specimen was sent for labs. CXR ordered.  Otto Caraway S Criss Pallone PA-C 07/20/2016 10:40 AM

## 2016-07-20 NOTE — Progress Notes (Signed)
Pharmacy Antibiotic Note  Colman CaterConstance Buhl is a 36 y.o. 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  Pharmacy has been consulted for vancomycin dosing.  Trough at goal with new dose.  Plan: This patient's current antibiotics will be continued without adjustments.  Height: 5\' 2"  (157.5 cm) Weight: 120 lb (54.4 kg) IBW/kg (Calculated) : 50.1  Temp (24hrs), Avg:101 F (38.3 C), Min:98.8 F (37.1 C), Max:102.9 F (39.4 C)   Recent Labs Lab 07/14/16 0530 07/14/16 0841 07/14/16 1141  07/16/16 0926 07/17/16 0541 07/18/16 0415 07/19/16 1411 07/20/16 0413  WBC  --  16.2*  --   --  13.2* 13.0* 12.2*  --   --   CREATININE 0.57  --   --   --   --  0.63 0.69 0.70  --   LATICACIDVEN  --  1.8 1.1  --   --   --   --   --   --   VANCOTROUGH  --   --   --   < >  --   --  30*  --  16  < > = values in this interval not displayed.  Estimated Creatinine Clearance: 76.2 mL/min (by C-G formula based on SCr of 0.7 mg/dL).    No Known Allergies  Antimicrobials this admission: Vanc 1/5 >>  Zosyn 1/5 >> 1/5  Dose adjustments this admission:  1/6 VT = 15 - on vanc 750 mg IV q 8 hrs 1/8 VT = 18 - on vanc 1g IV q 8 hrs 1/12 VT= 24 drawn from PICC line 1.5hr early - will leave dose alone for now 1/16 @ 0415 measured VT @30  mcg/ml (only 5 hours after evening dose) so corrects to ~24 mcg/ml >> will reduce to 750 mg/8h 1/18 VT 16 - on vanc 750mg  IV Q8H  Microbiology results:  1/5 BCx: Staph aureus **1/5 BCID - MRSA 1/5 UCx: neg 1/5 MRSA PCR: (+) 1/6 resp panel pending 1/6 BCx x 2 > 1/2 MRSA 1/7 BCx x 2 > 1/2 staph aureus 1/9  Bcx >> NGf 1/6 HCV Ab > 11, viral load not calc- spontaneous clearing per ID  Thank you for allowing pharmacy to be a part of this patient's care.  Vernard GamblesVeronda Antanisha Mohs, PharmD, BCPS  07/20/2016 5:50 AM

## 2016-07-21 ENCOUNTER — Inpatient Hospital Stay (HOSPITAL_COMMUNITY): Payer: Self-pay

## 2016-07-21 LAB — CREATININE, SERUM
CREATININE: 0.59 mg/dL (ref 0.44–1.00)
GFR calc Af Amer: >60 mL/min (ref >60)

## 2016-07-21 MED ORDER — DIPHENHYDRAMINE HCL 25 MG PO CAPS
25.0000 mg | ORAL_CAPSULE | Freq: Once | ORAL | Status: AC
  Administered 2016-07-21: 25 mg via ORAL
  Filled 2016-07-21: qty 1

## 2016-07-21 NOTE — Progress Notes (Signed)
  Subjective: MRSA bacteremia with tricuspid valve vegetation but no  Valvular Functional abnormality-insufficiency Bilateral pulmonary nodules-septic emboli, responding to IV antibiotics Right pleural effusion-thoracentesis completed yesterday removing 160 mL serosanguineous fluid with cultures pending. Gram stain of fluid shows scattered white cells and no organisms. No growth of pleural fluid so far  Would not recommend VATS for right pleural effusion unless pleural fluid culture positive  We'll obtain follow-up PA-lateral chest x-ray tomorrow Objective: Vital signs in last 24 hours: Temp:  [98.2 F (36.8 C)-102 F (38.9 C)] 98.2 F (36.8 C) (01/19 1007) Pulse Rate:  [85-99] 86 (01/19 1007) Cardiac Rhythm: Normal sinus rhythm (01/19 0704) Resp:  [17-19] 17 (01/19 1007) BP: (119-150)/(85-97) 138/97 (01/19 1007) SpO2:  [97 %-100 %] 100 % (01/19 1007) Weight:  [119 lb 14.9 oz (54.4 kg)] 119 lb 14.9 oz (54.4 kg) (01/18 2122)  Hemodynamic parameters for last 24 hours:  febrile to 102 last p.m.  Intake/Output from previous day: 01/18 0701 - 01/19 0700 In: 540 [P.O.:240; IV Piggyback:300] Out: 1600 [Urine:1600] Intake/Output this shift: No intake/output data recorded.    Lab Results: No results for input(s): WBC, HGB, HCT, PLT in the last 72 hours. BMET:  Recent Labs  07/19/16 1411 07/21/16 0452  CREATININE 0.70 0.59    PT/INR: No results for input(s): LABPROT, INR in the last 72 hours. ABG No results found for: PHART, HCO3, TCO2, ACIDBASEDEF, O2SAT CBG (last 3)  No results for input(s): GLUCAP in the last 72 hours.  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

## 2016-07-21 NOTE — Progress Notes (Signed)
PROGRESS NOTE                                                                                                                                                                                                             Patient Demographics:    Anna Colon, is a 36 y.o. female, DOB - 1981/04/22, ZOX:[id number]  Admit date - 07/07/2016   Admitting Physician Eduard Clos, MD  Outpatient Primary MD for the patient is No PCP Per Patient  LOS - 14  Outpatient Specialists:None  Chief Complaint  Patient presents with  . Vomiting       Brief Narrative   36 year old female with history of asthma, hep C, IV drug use presented to the ED with weakness, fatigue and cough of 2 week duration. Patient had a arrest warrant and was taken to [place] jail. She was seen by a physician there who started on antibiotics for her illness. See was that out of the jail that she could go to the hospital and then presented to the ED. 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 Patient reported subjective fever with chills and nausea. She was tachycardic and tachypneic in the ED, afebrile. Chest x-ray concerning for septic emboli. CT angiogram of the chest showed bilateral cavitary nodules consistent with septic pulmonary emboli. Also showed findings concerning for bilateral pyelonephritis. Sepsis pathway initiated and admitted to stepdown unit. Blood cultures growing MRSA.    Subjective:   Still complains of right shoulder pain with difficulty moving it. Still having temperature spikes.  Assessment  & Plan :    Principal Problem:   MRSA bacteremia With tricuspid valve endocarditis, septic pulmonary emboli and possible septic renal emboli Secondary to IV drug use. Sepsis was improving but again became febrile. Chest x-ray showing worsening pneumonia. Check CT of the chest with contrast. -empiric IV  vancomycin.  PICC line placed for poor IV access. Repeat blood culture negative For growth. Appreciate ID consult. . Will determine duration of antibiotic. Continue telemetry monitoring.  Active Problems:   Sepsis (HCC) Again having fever spikes. Follow CT of the chest reports pleural effusions question whether this is secondary to infectious etiology. Continue empiric antibiotic.  Pleural effusions - Consulted CT surgery plan is for thoracentesis and based on results there are further recommendations.  Shortness of  breath with pleural effusion and symptomatic anemia Received IV Lasix and 1 unit PRBC. Hemoglobin improved. Culture pending.  Generalized pain Possibly due to septic emboli and? Withdrawal from narcotics. Discontinued NSAIDs given anemia.Marland Kitchen  on oxycodone IR and low-dose Dilaudid. Added clonidine with good response.  Right shoulder pain Reports ongoing since admission (says the police may have dislocated her shoulder when they handcuffed her). Order Lidoderm patch. X-ray of the shoulder unremarkable.  IVDU (intravenous drug user) Patient plans to quit. Father concerned about visitors coming to meet her and thinks that they will encourage her to use drugs.   Tobacco abuse Counseled on cessation. nicotine patch.    ETOH abuse No signs of withdrawal. Monitor on CIWA. Added low dose clonidine    Thrombocytopenia (HCC) On admission secondary to sepsis. Resolved.    Endocarditis of tricuspid valve Continue IV antibiotic. Duration per ID. No signs of acute heart failure.  Iron deficiency anemia Added supplement   Hypophosphatemia Replenished. Check phosphorus in the morning.  Hepatitis C Secondary to IV drug use. Viral load negative. Suggests  spontaneous clearing  Sacral pressure ulcer Wound care consulted   Code Status : Full code  Family Communication  : father at bedside.  Disposition Plan  :  Currently inpatient. Father drives a truck and usually out of  home. He is worried that if he takes her home she will be by herself and will again have her boyfriend and other people coming at home and she will be back on drugs. Reports that this has happened before. He is hoping if she will qualify to go to skilled nursing facility. Social work following. Plan on discharge if patient remains afebrile.  Barriers For Discharge : Active symptoms  Consults  : Infectious disease  Procedures  :  CT angiogram of the chest CT abdomen and pelvis 2-D echo  DVT Prophylaxis  :  Lovenox   Lab Results  Component Value Date   PLT 260 07/18/2016    Antibiotics  :   Anti-infectives    Start     Dose/Rate Route Frequency Ordered Stop   07/18/16 1200  vancomycin (VANCOCIN) IVPB 750 mg/150 ml premix     750 mg 150 mL/hr over 60 Minutes Intravenous Every 8 hours 07/18/16 1002     07/08/16 1515  vancomycin (VANCOCIN) IVPB 1000 mg/200 mL premix  Status:  Discontinued     1,000 mg 200 mL/hr over 60 Minutes Intravenous Every 8 hours 07/08/16 1501 07/18/16 0556   07/07/16 2200  piperacillin-tazobactam (ZOSYN) IVPB 3.375 g  Status:  Discontinued     3.375 g 12.5 mL/hr over 240 Minutes Intravenous Every 8 hours 07/07/16 1605 07/07/16 1754   07/07/16 1615  vancomycin (VANCOCIN) IVPB 750 mg/150 ml premix  Status:  Discontinued     750 mg 150 mL/hr over 60 Minutes Intravenous Every 8 hours 07/07/16 1605 07/08/16 1501   07/07/16 0813  piperacillin-tazobactam (ZOSYN) IVPB 3.375 g     3.375 g 100 mL/hr over 30 Minutes Intravenous  Once 07/07/16 0644 07/07/16 0839   07/07/16 0215  piperacillin-tazobactam (ZOSYN) IVPB 3.375 g     3.375 g 100 mL/hr over 30 Minutes Intravenous  Once 07/07/16 0213 07/07/16 0313   07/07/16 0215  vancomycin (VANCOCIN) IVPB 1000 mg/200 mL premix     1,000 mg 200 mL/hr over 60 Minutes Intravenous  Once 07/07/16 0213 07/07/16 0343        Objective:   Vitals:   07/21/16 0604 07/21/16 1007 07/21/16 1200 07/21/16 1635  BP: (!) 134/94 (!)  138/97 127/88 132/82  Pulse: 85 86 95 87  Resp: 18 17  16   Temp: 98.6 F (37 C) 98.2 F (36.8 C)  98.5 F (36.9 C)  TempSrc:  Oral  Oral  SpO2: 99% 100%  99%  Weight:      Height:        Wt Readings from Last 3 Encounters:  07/20/16 54.4 kg (119 lb 14.9 oz)     Intake/Output Summary (Last 24 hours) at 07/21/16 1741 Last data filed at 07/21/16 1450  Gross per 24 hour  Intake              630 ml  Output             1100 ml  Net             -470 ml     Physical Exam  Gen: alert and awake, in nad. Reports pain HEENT:  Pallor present, moist mucosa, supple neck Chest: CTA BL, no wheezes, equal chest rise. CVS:S1&S2 regular, no murmurs  GI: soft, ND , nontender Musculoskeletal: warm, no edema, does not let me examine her right arm and shoulder well CNS: Alert and oriented    Data Review:    CBC  Recent Labs Lab 07/16/16 0926 07/17/16 0541 07/18/16 0415  WBC 13.2* 13.0* 12.2*  HGB 6.6* 8.6* 9.1*  HCT 20.7* 26.5* 28.9*  PLT 244 299 260  MCV 85.2 84.1 84.8  MCH 27.2 27.3 26.7  MCHC 31.9 32.5 31.5  RDW 16.9* 16.2* 16.4*    Chemistries   Recent Labs Lab 07/17/16 0541 07/18/16 0415 07/19/16 1411 07/21/16 0452  NA 132*  --   --   --   K 4.3  --   --   --   CL 96*  --   --   --   CO2 28  --   --   --   GLUCOSE 94  --   --   --   BUN 9  --   --   --   CREATININE 0.63 0.69 0.70 0.59  CALCIUM 8.3*  --   --   --    ------------------------------------------------------------------------------------------------------------------ No results for input(s): CHOL, HDL, LDLCALC, TRIG, CHOLHDL, LDLDIRECT in the last 72 hours.  No results found for: HGBA1C ------------------------------------------------------------------------------------------------------------------ No results for input(s): TSH, T4TOTAL, T3FREE, THYROIDAB in the last 72 hours.  Invalid input(s):  FREET3 ------------------------------------------------------------------------------------------------------------------ No results for input(s): VITAMINB12, FOLATE, FERRITIN, TIBC, IRON, RETICCTPCT in the last 72 hours.  Coagulation profile No results for input(s): INR, PROTIME in the last 168 hours.  No results for input(s): DDIMER in the last 72 hours.  Cardiac Enzymes No results for input(s): CKMB, TROPONINI, MYOGLOBIN in the last 168 hours.  Invalid input(s): CK ------------------------------------------------------------------------------------------------------------------ No results found for: BNP  Inpatient Medications  Scheduled Meds: . cloNIDine  0.1 mg Oral BID  . enoxaparin (LOVENOX) injection  40 mg Subcutaneous Q24H  . feeding supplement (ENSURE ENLIVE)  237 mL Oral BID BM  . ferrous sulfate  325 mg Oral BID WC  . folic acid  1 mg Oral Daily  . lidocaine  1 patch Transdermal Q24H  . multivitamin with minerals  1 tablet Oral Daily  . nicotine  21 mg Transdermal Daily  . sodium chloride flush  10-40 mL Intracatheter Q12H  . thiamine  100 mg Oral Daily   Or  . thiamine  100 mg Intravenous Daily  . triamcinolone ointment   Topical BID  .  vancomycin  750 mg Intravenous Q8H   Continuous Infusions:  PRN Meds:.acetaminophen **OR** acetaminophen, albuterol, hydrALAZINE, HYDROmorphone (DILAUDID) injection, ondansetron **OR** ondansetron (ZOFRAN) IV, oxyCODONE, polyethylene glycol, sodium chloride flush, zolpidem  Micro Results Recent Results (from the past 240 hour(s))  Culture, body fluid-bottle     Status: None (Preliminary result)   Collection Time: 07/20/16 11:27 AM  Result Value Ref Range Status   Specimen Description PERITONEAL  Final   Special Requests NONE  Final   Culture NO GROWTH 1 DAY  Final   Report Status PENDING  Incomplete  Gram stain     Status: None   Collection Time: 07/20/16 11:27 AM  Result Value Ref Range Status   Specimen Description  PERITONEAL  Final   Special Requests NONE  Final   Gram Stain   Final    FEW WBC PRESENT,BOTH PMN AND MONONUCLEAR NO ORGANISMS SEEN    Report Status 07/20/2016 FINAL  Final    Radiology Reports Dg Orthopantogram  Result Date: 07/20/2016 CLINICAL DATA:  Sepsis.  Tooth abscess. EXAM: ORTHOPANTOGRAM/PANORAMIC COMPARISON:  CT 02/18/2011. FINDINGS: No acute bony abnormality identified. No evidence of fracture or dislocation. Periapical lucencies noted throughout the remaining teeth along the mandible suggesting dental disease. No prominent lytic lesion noted. No fracture . If further evaluation is needed maxillofacial CT can be obtained. IMPRESSION: Muscle mild periapical lucencies are noted throughout the remaining teeth of the lower mandible. No prominent lytic lesion noted. Electronically Signed   By: Maisie Fus  Register   On: 07/20/2016 11:19   Dg Chest 1 View  Result Date: 07/20/2016 CLINICAL DATA:  Status post right thoracentesis. EXAM: CHEST 1 VIEW COMPARISON:  07/16/2016 FINDINGS: Right arm PICC line tip is in the projection of the cavoatrial junction. Normal heart size. Bilateral pleural effusions are identified, left greater than right. There is mild interstitial edema. Persistent bilateral mid lung and lower lobe opacities. IMPRESSION: 1. No change in bilateral pleural effusions and bilateral lung opacities. Electronically Signed   By: Signa Kell M.D.   On: 07/20/2016 11:08   Dg Chest 2 View  Result Date: 07/21/2016 CLINICAL DATA:  Sepsis. EXAM: CHEST  2 VIEW COMPARISON:  07/20/2016. FINDINGS: Right PICC line stable position. Heart size stable. Multifocal bilateral pulmonary infiltrates and bilateral pleural effusions again noted. Pulmonary infiltrates have progressed slightly. IMPRESSION: 1.  Right PICC line stable position. 2. Progressive multifocal bilateral pulmonary infiltrates. Persistent bilateral pleural effusions. Electronically Signed   By: Maisie Fus  Register   On: 07/21/2016 12:03    Dg Chest 2 View  Result Date: 07/13/2016 CLINICAL DATA:  Pneumonia EXAM: CHEST  2 VIEW COMPARISON:  07/08/2016 FINDINGS: Bilateral pleural effusions, left greater than right with lower lobe airspace opacities, also worse on the left. This is improved somewhat on the right, not significantly changed on the left. Right PICC line is in place with the tip at the cavoatrial junction. Low lung volumes. Heart is normal size. IMPRESSION: Bilateral lower lobe airspace opacities, left greater than right. Some improvement on the right since prior study with no change on the left. Small bilateral effusions. Electronically Signed   By: Charlett Nose M.D.   On: 07/13/2016 13:19   Dg Chest 2 View  Result Date: 07/07/2016 CLINICAL DATA:  Nausea vomiting and diarrhea for 8 days EXAM: CHEST  2 VIEW COMPARISON:  None. FINDINGS: Numerous ill-defined nodules throughout both lungs, many cavitary. No confluent consolidation. No large effusion. Hilar, mediastinal and cardiac contours are unremarkable. IMPRESSION: Innumerable nodules, many cavitary. Septic emboli  are a leading consideration. Noninfectious inflammatory process or neoplastic process are less likely possibilities. Electronically Signed   By: Ellery Plunk M.D.   On: 07/07/2016 01:55   Dg Shoulder Right  Result Date: 07/18/2016 CLINICAL DATA:  Right shoulder pain with fever and sepsis. EXAM: RIGHT SHOULDER - 2+ VIEW COMPARISON:  Chest x-ray 07/16/2016 FINDINGS: Right-sided PICC line unchanged. Bones, joint spaces and soft tissues over the right shoulder are within normal. Stable changes within the right lung. IMPRESSION: Unremarkable right shoulder. Electronically Signed   By: Elberta Fortis M.D.   On: 07/18/2016 10:49   Ct Chest W Contrast  Result Date: 07/19/2016 CLINICAL DATA:  Pneumonia, cough, fever, septic embolism, history hepatitis-C, asthma, IV drug abuse, followup EXAM: CT CHEST WITH CONTRAST TECHNIQUE: Multidetector CT imaging of the chest was  performed during intravenous contrast administration. Sagittal and coronal MPR images reconstructed from axial data set. CONTRAST:  75 mL ISOVUE-300 IOPAMIDOL (ISOVUE-300) INJECTION 61% IV COMPARISON:  07/07/2016 FINDINGS: Cardiovascular: Vascular structures grossly patent on nondedicated exam. RIGHT arm PICC line with tip in SVC near cavoatrial junction. No pericardial effusion. Mediastinum/Nodes: Few scattered normal size mediastinal lymph nodes. No definite thoracic adenopathy. Esophagus unremarkable. Base of cervical region normal appearance. Lungs/Pleura: Multiple cavitary nodules are seen within both lungs consistent with septic emboli. Number of nodules has decreased since the previous exam, with some of the smaller nodule seen previously no longer identified. Remaining nodules are stable to decreased in sizes ; RIGHT upper lobe nodule measured previously at 21 mm now measures 16 mm image 32 and previously measured LEFT upper lobe nodule of 23 mm measures 16 mm at present. BILATERAL pleural effusions with compressive atelectasis of the lower lobes. No pneumothorax. Upper Abdomen: Beam hardening artifacts from patient's RIGHT arm. Spleen appears enlarged. No additional upper abdominal abnormalities seen. Musculoskeletal: No acute osseous findings. IMPRESSION: New BILATERAL pleural effusions with compressive atelectasis of the lower lobes. Cavitary lung nodules bilaterally compatible with septic emboli, decreased in number with remaining nodules stable to decreased in sizes since the previous exam. Electronically Signed   By: Ulyses Southward M.D.   On: 07/19/2016 13:01   Ct Angio Chest Pe W And/or Wo Contrast  Result Date: 07/07/2016 CLINICAL DATA:  Short of breath fever, tachycardia. IV drug abuse. Leukocytosis. EXAM: CT ANGIOGRAPHY CHEST CT ABDOMEN AND PELVIS WITH CONTRAST TECHNIQUE: Multidetector CT imaging of the chest was performed using the standard protocol during bolus administration of intravenous  contrast. Multiplanar CT image reconstructions and MIPs were obtained to evaluate the vascular anatomy. Multidetector CT imaging of the abdomen and pelvis was performed using the standard protocol during bolus administration of intravenous contrast. CONTRAST:  100 mL Isovue COMPARISON:  Chest radiograph 07/07/2016 FINDINGS: CTA CHEST FINDINGS Cardiovascular: No filling defects within the pulmonary to suggest acute pulmonary embolism. No acute findings of the aorta great vessels. No pericardial effusion. Heart is grossly normal. Mediastinum/Nodes: No axillary or supraclavicular adenopathy. No mediastinal hilar adenopathy. Ill-defined peribronchial thickening in the hila. Lungs/Pleura: Bilateral thick-walled cavitary nodules in the upper and lower lobes. Consolidated airspace disease in the lower lobes. Approximately 30 nodules per lung. Example nodule cavitary nodule in the RIGHT upper lobe measures 21 mm (image 27, series 6). Example cavitary nodule in the LEFT upper lobe measures 23 mm on image 30 series 6. Musculoskeletal: No aggressive osseous lesion. Review of the MIP images confirms the above findings. CT ABDOMEN and PELVIS FINDINGS Hepatobiliary: Liver is enlarged. No focal hepatic lesion. Periportal edema noted. Portal veins are  patent. Gallbladder wall is edematous and thickened to 6 mm. The gallbladder lumen is contracted. Common bile duct normal caliber. Pancreas: No pancreatic inflammation or duct dilatation Spleen: Spleen is enlarged measuring 14.7 by 7.5 x 12.4 cm (volume = 720 cm^3). Low-density lesion in the spleen measuring 12 mm does not have simple fluid attenuation. Adrenals/Urinary Tract: Adrenal glands are normal. There is heterogeneous enhancement of the kidneys on the cortical phase imaging (image 32, series 2 for example). On the delayed pyelogram phase imaging the kidneys have a striated appearance (image 14 series 17). No renal obstruction. Bladder normal Stomach/Bowel: Stomach, small  bowel, appendix, and cecum are normal. The colon and rectosigmoid colon are normal. Vascular/Lymphatic: Abdominal aorta is normal caliber. No abdominal lymphadenopathy. Reproductive: Uterus and ovaries grossly normal Other: Smaller free fluid in the posterior pelvis Musculoskeletal: No aggressive osseous lesion. Review of the MIP images confirms the above findings. IMPRESSION: Chest Impression: 1. Bilateral thick-walled cavitary nodules consistent with SEPTIC PULMONARY EMBOLI in this population (young patient with IV drug use). 2. Bibasilar consolidation consistent with pneumonia Abdomen / Pelvis Impression: 1. 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 2. Hepatosplenomegaly. Low-density lesion in the spleen is indeterminate. 3. Small volume of intraperitoneal free fluid and periportal edema may relate to hepatic dysfunction. Electronically Signed   By: Genevive Bi M.D.   On: 07/07/2016 09:29   Ct Abdomen Pelvis W Contrast  Result Date: 07/07/2016 CLINICAL DATA:  Short of breath fever, tachycardia. IV drug abuse. Leukocytosis. EXAM: CT ANGIOGRAPHY CHEST CT ABDOMEN AND PELVIS WITH CONTRAST TECHNIQUE: Multidetector CT imaging of the chest was performed using the standard protocol during bolus administration of intravenous contrast. Multiplanar CT image reconstructions and MIPs were obtained to evaluate the vascular anatomy. Multidetector CT imaging of the abdomen and pelvis was performed using the standard protocol during bolus administration of intravenous contrast. CONTRAST:  100 mL Isovue COMPARISON:  Chest radiograph 07/07/2016 FINDINGS: CTA CHEST FINDINGS Cardiovascular: No filling defects within the pulmonary to suggest acute pulmonary embolism. No acute findings of the aorta great vessels. No pericardial effusion. Heart is grossly normal. Mediastinum/Nodes: No axillary or supraclavicular adenopathy. No mediastinal hilar adenopathy. Ill-defined peribronchial thickening  in the hila. Lungs/Pleura: Bilateral thick-walled cavitary nodules in the upper and lower lobes. Consolidated airspace disease in the lower lobes. Approximately 30 nodules per lung. Example nodule cavitary nodule in the RIGHT upper lobe measures 21 mm (image 27, series 6). Example cavitary nodule in the LEFT upper lobe measures 23 mm on image 30 series 6. Musculoskeletal: No aggressive osseous lesion. Review of the MIP images confirms the above findings. CT ABDOMEN and PELVIS FINDINGS Hepatobiliary: Liver is enlarged. No focal hepatic lesion. Periportal edema noted. Portal veins are patent. Gallbladder wall is edematous and thickened to 6 mm. The gallbladder lumen is contracted. Common bile duct normal caliber. Pancreas: No pancreatic inflammation or duct dilatation Spleen: Spleen is enlarged measuring 14.7 by 7.5 x 12.4 cm (volume = 720 cm^3). Low-density lesion in the spleen measuring 12 mm does not have simple fluid attenuation. Adrenals/Urinary Tract: Adrenal glands are normal. There is heterogeneous enhancement of the kidneys on the cortical phase imaging (image 32, series 2 for example). On the delayed pyelogram phase imaging the kidneys have a striated appearance (image 14 series 17). No renal obstruction. Bladder normal Stomach/Bowel: Stomach, small bowel, appendix, and cecum are normal. The colon and rectosigmoid colon are normal. Vascular/Lymphatic: Abdominal aorta is normal caliber. No abdominal lymphadenopathy. Reproductive: Uterus and ovaries grossly normal Other: Smaller  free fluid in the posterior pelvis Musculoskeletal: No aggressive osseous lesion. Review of the MIP images confirms the above findings. IMPRESSION: Chest Impression: 1. Bilateral thick-walled cavitary nodules consistent with SEPTIC PULMONARY EMBOLI in this population (young patient with IV drug use). 2. Bibasilar consolidation consistent with pneumonia Abdomen / Pelvis Impression: 1. Striated appearance of the kidneys consistent with  BILATERAL PYELONEPHRITIS. 2. Hepatosplenomegaly. Low-density lesion in the spleen is indeterminate. 3. Small volume of intraperitoneal free fluid and periportal edema may relate to hepatic dysfunction. Electronically Signed   By: Genevive Bi M.D.   On: 07/07/2016 09:29   Dg Chest Port 1 View  Result Date: 07/16/2016 CLINICAL DATA:  MRSA bacteremia with septic pulmonary emboli. EXAM: PORTABLE CHEST 1 VIEW COMPARISON:  07/13/2016. FINDINGS: Right PICC tip at the superior cavoatrial junction. Normal sized heart. Mildly increased bilateral airspace opacity and bilateral pleural fluid. Unremarkable bones. IMPRESSION: Mildly increased bilateral pneumonia and pleural fluid. Electronically Signed   By: Beckie Salts M.D.   On: 07/16/2016 09:53   Dg Chest Port 1 View  Result Date: 07/08/2016 CLINICAL DATA:  Patient admitted yesterday with MR SA bacteremia. Possible aspiration today. EXAM: PORTABLE CHEST 1 VIEW COMPARISON:  CT chest and PA and lateral chest 07/07/2014. FINDINGS: Multiple nodular opacities bilaterally again seen. There is increased bibasilar airspace disease. No pneumothorax. Small pleural effusions noted. Heart size is normal. IMPRESSION: Increased bibasilar airspace disease could be due to atelectasis, aspiration and/or pneumonia. Multifocal nodular opacities compatible with septic emboli as seen on chest CT yesterday. Electronically Signed   By: Drusilla Kanner M.D.   On: 07/08/2016 12:07   US Thoracentesis Asp Pleural Space W/img Guide  Result Date: 07/20/2016 INDICATION: Pneumonia, cough, fever, septic embolism, history of hepatitis-C, asthma, IV drug abuse, bilateral pleural effusions seen on CT scan. Request for diagnostic and therapeutic thoracentesis. EXAM: ULTRASOUND GUIDED RIGHT THORACENTESIS MEDICATIONS: 1% Lidocaine = 10 mL. COMPLICATIONS: None immediate. PROCEDURE: An ultrasound guided thoracentesis was thoroughly discussed with the patient and questions answered. The benefits,  risks, alternatives and complications were also discussed. The patient understands and wishes to proceed with the procedure. Written consent was obtained. Ultrasound was performed to localize and mark an adequate pocket of fluid in the right chest. The effusion appeared loculated. The area was then prepped and draped in the normal sterile fashion. 1% Lidocaine was used for local anesthesia. Under ultrasound guidance a 6 Fr Safe-T-Centesis catheter was introduced. Thoracentesis was performed. The catheter was removed and a dressing applied. FINDINGS: A total of approximately 160 mL of blood tinged fluid was removed. Samples were sent to the laboratory as requested by the clinical team. IMPRESSION: Successful ultrasound guided right thoracentesis yielding only 160 mL of pleural fluid due to loculations. Read by:  Corrin Parker, PA-C Electronically Signed   By: Richarda Overlie M.D.   On: 07/20/2016 10:38    Time Spent in minutes 35   Penny Pia M.D on 07/21/2016 at 5:41 PM  Between 7am to 7pm - Pager - [phone]  After 7pm go to [email or web address] - password [facility]  [facility] -  Office  [phone]

## 2016-07-21 NOTE — Progress Notes (Signed)
Pt states she itching on back and legs "for days now". Made Dr. Cena BentonVega aware.   Will continue to monitor.

## 2016-07-21 NOTE — Progress Notes (Signed)
    [facility]   Reason for visit: Follow up on MRSA bacteremia  Interval History: no growth so far  Physical Exam: Constitutional:  Vitals:   07/21/16 1007 07/21/16 1200  BP: (!) 138/97 127/88  Pulse: 86 95  Resp: 17   Temp: 98.2 F (36.8 C)    patient appears in NAD  Impression: Stable MRSA with pleural effusions  Plan: 1.  No changes from ID standpoint

## 2016-07-22 MED ORDER — MELOXICAM 7.5 MG PO TABS
15.0000 mg | ORAL_TABLET | Freq: Every day | ORAL | Status: DC
  Administered 2016-07-22 – 2016-08-05 (×14): 15 mg via ORAL
  Filled 2016-07-22 (×16): qty 2

## 2016-07-22 NOTE — Progress Notes (Signed)
    [facility]   Reason for visit: Follow up on MRSA bacteremia  Interval History: no growth on effusion culture; Tmax 100.8; continued complaint of pain; no n/v/d  Physical Exam: Constitutional:  Vitals:   07/22/16 0513 07/22/16 0911  BP: 116/82 119/83  Pulse: 72 91  Resp: 16 16  Temp: 98.9 F (37.2 C) 99.6 F (37.6 C)   patient appears in NAD Respiratory: Normal respiratory effort; CTA B Cardiovascular: RRR GI: soft, nt  Review of Systems: Constitutional: positive for fevers or negative for chills Gastrointestinal: negative for diarrhea Integument/breast: negative for rash  Lab Results  Component Value Date   WBC 12.2 (H) 07/18/2016   HGB 9.1 (L) 07/18/2016   HCT 28.9 (L) 07/18/2016   MCV 84.8 07/18/2016   PLT 260 07/18/2016    Lab Results  Component Value Date   CREATININE 0.59 07/21/2016   BUN 9 07/17/2016   NA 132 (L) 07/17/2016   K 4.3 07/17/2016   CL 96 (L) 07/17/2016   CO2 28 07/17/2016    Lab Results  Component Value Date   ALT 19 07/08/2016   AST 31 07/08/2016   ALKPHOS 93 07/08/2016     Microbiology: Recent Results (from the past 240 hour(s))  Culture, body fluid-bottle     Status: None (Preliminary result)   Collection Time: 07/20/16 11:27 AM  Result Value Ref Range Status   Specimen Description PERITONEAL  Final   Special Requests NONE  Final   Culture NO GROWTH 2 DAYS  Final   Report Status PENDING  Incomplete  Gram stain     Status: None   Collection Time: 07/20/16 11:27 AM  Result Value Ref Range Status   Specimen Description PERITONEAL  Final   Special Requests NONE  Final   Gram Stain   Final    FEW WBC PRESENT,BOTH PMN AND MONONUCLEAR NO ORGANISMS SEEN    Report Status 07/20/2016 FINAL  Final    Impression/Plan:  1. MRSA bacteremia - on vancomycin.  Repeat blood cultures negative. 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  She will need to be placed, cannot go home  with a picc line. 3.  Substance abuse - will need continued efforts at this.

## 2016-07-22 NOTE — Progress Notes (Signed)
PROGRESS NOTE                                                                                                                                                                                                             Patient Demographics:    Anna Colon, is a 36 y.o. female, DOB - 1981/06/14, ZOX:[id number]  Admit date - 07/07/2016   Admitting Physician Eduard Clos, MD  Outpatient Primary MD for the patient is No PCP Per Patient  LOS - 15  Outpatient Specialists:None  Chief Complaint  Patient presents with  . Vomiting       Brief Narrative   36 year old female with history of asthma, hep C, IV drug use presented to the ED with weakness, fatigue and cough of 2 week duration. Patient had a arrest warrant and was taken to [place] [facility]. She was seen by a physician there who started on antibiotics for her illness. See was that out of the jail that she could go to the hospital and then presented to the ED. 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 Patient reported subjective fever with chills and nausea. She was tachycardic and tachypneic in the ED, afebrile. Chest x-ray concerning for septic emboli. CT angiogram of the chest showed bilateral cavitary nodules consistent with septic pulmonary emboli. Also showed findings concerning for bilateral pyelonephritis. Sepsis pathway initiated and admitted to stepdown unit. Blood cultures growing MRSA.    Subjective:   Still complains of right shoulder pain does not report any itching problems to me.  Assessment  & Plan :    Principal Problem:   MRSA bacteremia With tricuspid valve endocarditis, septic pulmonary emboli and possible septic renal emboli Secondary to IV drug use. Sepsis was improving but again became febrile. Chest x-ray showing worsening pneumonia. Check CT of the chest with contrast. -empiric IV vancomycin.  PICC line  placed for poor IV access. Repeat blood culture negative For growth. Appreciate ID consult. . Will assist with determining duration of antibiotic. Continue telemetry monitoring.  Active Problems:   Sepsis (HCC) Again having fever spikes. Follow CT of the chest reports pleural effusions question whether this is secondary to infectious etiology. Continue empiric antibiotic.  Pleural effusions - Consulted CT surgery plan is for thoracentesis and based on results there are further recommendations. Thoracentesis  results pending.  Shortness of breath with pleural effusion and symptomatic anemia Received IV Lasix and 1 unit PRBC. Improved after thoracentesis  Generalized pain Possibly due to septic emboli and? Withdrawal from narcotics. Discontinued NSAIDs given anemia.Marland Kitchen.  on oxycodone IR and low-dose Dilaudid. Added clonidine with good response.  Right shoulder pain Reports ongoing since admission (says the police may have dislocated her shoulder when they handcuffed her). Order Lidoderm patch. X-ray of the shoulder unremarkable. Pt reports pain over trapezius, deltoid (posterior) bicep, tricep, forearm. Exam really inconsistent with story. I suspect there may be a psych component to this. Will add Meloxicam to assist with anti inflammatory and with pain. If psychiatric component however meloxicam will not help.   IVDU (intravenous drug user) Patient plans to quit. Father concerned about visitors coming to meet her and thinks that they will encourage her to use drugs.   Tobacco abuse Counseled on cessation. nicotine patch.    ETOH abuse No signs of withdrawal. Monitor on CIWA. Added low dose clonidine    Thrombocytopenia (HCC) On admission secondary to sepsis. Resolved.    Endocarditis of tricuspid valve Continue IV antibiotic. Duration per ID. No signs of acute heart failure.  Iron deficiency anemia Added supplement   Hypophosphatemia Replenished. Check phosphorus in the  morning.  Hepatitis C Secondary to IV drug use. Viral load negative. Suggests  spontaneous clearing  Sacral pressure ulcer Wound care consulted   Code Status : Full code  Family Communication  : father at bedside.  Disposition Plan  :  Currently inpatient. Father drives a truck and usually out of home. He is worried that if he takes her home she will be by herself and will again have her boyfriend and other people coming at home and she will be back on drugs. Reports that this has happened before. He is hoping if she will qualify to go to skilled nursing facility. Social work following. Plan on discharge if patient remains afebrile.  Barriers For Discharge : Active symptoms  Consults  : Infectious disease  Procedures  :  CT angiogram of the chest CT abdomen and pelvis 2-D echo  DVT Prophylaxis  :  Lovenox   Lab Results  Component Value Date   PLT 260 07/18/2016    Antibiotics  :   Anti-infectives    Start     Dose/Rate Route Frequency Ordered Stop   07/18/16 1200  vancomycin (VANCOCIN) IVPB 750 mg/150 ml premix     750 mg 150 mL/hr over 60 Minutes Intravenous Every 8 hours 07/18/16 1002     07/08/16 1515  vancomycin (VANCOCIN) IVPB 1000 mg/200 mL premix  Status:  Discontinued     1,000 mg 200 mL/hr over 60 Minutes Intravenous Every 8 hours 07/08/16 1501 07/18/16 0556   07/07/16 2200  piperacillin-tazobactam (ZOSYN) IVPB 3.375 g  Status:  Discontinued     3.375 g 12.5 mL/hr over 240 Minutes Intravenous Every 8 hours 07/07/16 1605 07/07/16 1754   07/07/16 1615  vancomycin (VANCOCIN) IVPB 750 mg/150 ml premix  Status:  Discontinued     750 mg 150 mL/hr over 60 Minutes Intravenous Every 8 hours 07/07/16 1605 07/08/16 1501   07/07/16 0813  piperacillin-tazobactam (ZOSYN) IVPB 3.375 g     3.375 g 100 mL/hr over 30 Minutes Intravenous  Once 07/07/16 0644 07/07/16 0839   07/07/16 0215  piperacillin-tazobactam (ZOSYN) IVPB 3.375 g     3.375 g 100 mL/hr over 30 Minutes  Intravenous  Once 07/07/16 0213 07/07/16 0313  07/07/16 0215  vancomycin (VANCOCIN) IVPB 1000 mg/200 mL premix     1,000 mg 200 mL/hr over 60 Minutes Intravenous  Once 07/07/16 0213 07/07/16 0343        Objective:   Vitals:   07/21/16 1751 07/21/16 2126 07/22/16 0513 07/22/16 0911  BP: 129/80 (!) 134/93 116/82 119/83  Pulse: 90 98 72 91  Resp:  17 16 16   Temp:  (!) 100.8 F (38.2 C) 98.9 F (37.2 C) 99.6 F (37.6 C)  TempSrc:  Oral Oral Oral  SpO2:  97% 100% 96%  Weight:  54.3 kg (119 lb 11.4 oz)    Height:        Wt Readings from Last 3 Encounters:  07/21/16 54.3 kg (119 lb 11.4 oz)     Intake/Output Summary (Last 24 hours) at 07/22/16 1334 Last data filed at 07/22/16 0723  Gross per 24 hour  Intake              900 ml  Output             1025 ml  Net             -125 ml     Physical Exam  Gen: alert and awake, in nad. Reports pain HEENT:  Pallor present, moist mucosa, supple neck Chest: CTA BL, no wheezes, equal chest rise. CVS:S1&S2 regular, no murmurs  GI: soft, ND , nontender Musculoskeletal: warm, no edema, does not let me examine her right arm and shoulder well CNS: Alert and oriented    Data Review:    CBC  Recent Labs Lab 07/16/16 0926 07/17/16 0541 07/18/16 0415  WBC 13.2* 13.0* 12.2*  HGB 6.6* 8.6* 9.1*  HCT 20.7* 26.5* 28.9*  PLT 244 299 260  MCV 85.2 84.1 84.8  MCH 27.2 27.3 26.7  MCHC 31.9 32.5 31.5  RDW 16.9* 16.2* 16.4*    Chemistries   Recent Labs Lab 07/17/16 0541 07/18/16 0415 07/19/16 1411 07/21/16 0452  NA 132*  --   --   --   K 4.3  --   --   --   CL 96*  --   --   --   CO2 28  --   --   --   GLUCOSE 94  --   --   --   BUN 9  --   --   --   CREATININE 0.63 0.69 0.70 0.59  CALCIUM 8.3*  --   --   --    ------------------------------------------------------------------------------------------------------------------ No results for input(s): CHOL, HDL, LDLCALC, TRIG, CHOLHDL, LDLDIRECT in the last 72  hours.  No results found for: HGBA1C ------------------------------------------------------------------------------------------------------------------ No results for input(s): TSH, T4TOTAL, T3FREE, THYROIDAB in the last 72 hours.  Invalid input(s): FREET3 ------------------------------------------------------------------------------------------------------------------ No results for input(s): VITAMINB12, FOLATE, FERRITIN, TIBC, IRON, RETICCTPCT in the last 72 hours.  Coagulation profile No results for input(s): INR, PROTIME in the last 168 hours.  No results for input(s): DDIMER in the last 72 hours.  Cardiac Enzymes No results for input(s): CKMB, TROPONINI, MYOGLOBIN in the last 168 hours.  Invalid input(s): CK ------------------------------------------------------------------------------------------------------------------ No results found for: BNP  Inpatient Medications  Scheduled Meds: . cloNIDine  0.1 mg Oral BID  . enoxaparin (LOVENOX) injection  40 mg Subcutaneous Q24H  . feeding supplement (ENSURE ENLIVE)  237 mL Oral BID BM  . ferrous sulfate  325 mg Oral BID WC  . folic acid  1 mg Oral Daily  . lidocaine  1 patch Transdermal Q24H  .  meloxicam  15 mg Oral Daily  . multivitamin with minerals  1 tablet Oral Daily  . nicotine  21 mg Transdermal Daily  . sodium chloride flush  10-40 mL Intracatheter Q12H  . thiamine  100 mg Oral Daily   Or  . thiamine  100 mg Intravenous Daily  . triamcinolone ointment   Topical BID  . vancomycin  750 mg Intravenous Q8H   Continuous Infusions:  PRN Meds:.acetaminophen **OR** acetaminophen, albuterol, hydrALAZINE, HYDROmorphone (DILAUDID) injection, ondansetron **OR** ondansetron (ZOFRAN) IV, oxyCODONE, polyethylene glycol, sodium chloride flush, zolpidem  Micro Results Recent Results (from the past 240 hour(s))  Culture, body fluid-bottle     Status: None (Preliminary result)   Collection Time: 07/20/16 11:27 AM  Result Value  Ref Range Status   Specimen Description PERITONEAL  Final   Special Requests NONE  Final   Culture NO GROWTH 2 DAYS  Final   Report Status PENDING  Incomplete  Gram stain     Status: None   Collection Time: 07/20/16 11:27 AM  Result Value Ref Range Status   Specimen Description PERITONEAL  Final   Special Requests NONE  Final   Gram Stain   Final    FEW WBC PRESENT,BOTH PMN AND MONONUCLEAR NO ORGANISMS SEEN    Report Status 07/20/2016 FINAL  Final    Radiology Reports Dg Orthopantogram  Result Date: 07/20/2016 CLINICAL DATA:  Sepsis.  Tooth abscess. EXAM: ORTHOPANTOGRAM/PANORAMIC COMPARISON:  CT 02/18/2011. FINDINGS: No acute bony abnormality identified. No evidence of fracture or dislocation. Periapical lucencies noted throughout the remaining teeth along the mandible suggesting dental disease. No prominent lytic lesion noted. No fracture . If further evaluation is needed maxillofacial CT can be obtained. IMPRESSION: Muscle mild periapical lucencies are noted throughout the remaining teeth of the lower mandible. No prominent lytic lesion noted. Electronically Signed   By: Maisie Fus  Register   On: 07/20/2016 11:19   Dg Chest 1 View  Result Date: 07/20/2016 CLINICAL DATA:  Status post right thoracentesis. EXAM: CHEST 1 VIEW COMPARISON:  07/16/2016 FINDINGS: Right arm PICC line tip is in the projection of the cavoatrial junction. Normal heart size. Bilateral pleural effusions are identified, left greater than right. There is mild interstitial edema. Persistent bilateral mid lung and lower lobe opacities. IMPRESSION: 1. No change in bilateral pleural effusions and bilateral lung opacities. Electronically Signed   By: Signa Kell M.D.   On: 07/20/2016 11:08   Dg Chest 2 View  Result Date: 07/21/2016 CLINICAL DATA:  Sepsis. EXAM: CHEST  2 VIEW COMPARISON:  07/20/2016. FINDINGS: Right PICC line stable position. Heart size stable. Multifocal bilateral pulmonary infiltrates and bilateral pleural  effusions again noted. Pulmonary infiltrates have progressed slightly. IMPRESSION: 1.  Right PICC line stable position. 2. Progressive multifocal bilateral pulmonary infiltrates. Persistent bilateral pleural effusions. Electronically Signed   By: Maisie Fus  Register   On: 07/21/2016 12:03   Dg Chest 2 View  Result Date: 07/13/2016 CLINICAL DATA:  Pneumonia EXAM: CHEST  2 VIEW COMPARISON:  07/08/2016 FINDINGS: Bilateral pleural effusions, left greater than right with lower lobe airspace opacities, also worse on the left. This is improved somewhat on the right, not significantly changed on the left. Right PICC line is in place with the tip at the cavoatrial junction. Low lung volumes. Heart is normal size. IMPRESSION: Bilateral lower lobe airspace opacities, left greater than right. Some improvement on the right since prior study with no change on the left. Small bilateral effusions. Electronically Signed   By: Charlett Nose  M.D.   On: 07/13/2016 13:19   Dg Chest 2 View  Result Date: 07/07/2016 CLINICAL DATA:  Nausea vomiting and diarrhea for 8 days EXAM: CHEST  2 VIEW COMPARISON:  None. FINDINGS: Numerous ill-defined nodules throughout both lungs, many cavitary. No confluent consolidation. No large effusion. Hilar, mediastinal and cardiac contours are unremarkable. IMPRESSION: Innumerable nodules, many cavitary. Septic emboli are a leading consideration. Noninfectious inflammatory process or neoplastic process are less likely possibilities. Electronically Signed   By: Ellery Plunk M.D.   On: 07/07/2016 01:55   Dg Shoulder Right  Result Date: 07/18/2016 CLINICAL DATA:  Right shoulder pain with fever and sepsis. EXAM: RIGHT SHOULDER - 2+ VIEW COMPARISON:  Chest x-ray 07/16/2016 FINDINGS: Right-sided PICC line unchanged. Bones, joint spaces and soft tissues over the right shoulder are within normal. Stable changes within the right lung. IMPRESSION: Unremarkable right shoulder. Electronically Signed   By:  Elberta Fortis M.D.   On: 07/18/2016 10:49   Ct Chest W Contrast  Result Date: 07/19/2016 CLINICAL DATA:  Pneumonia, cough, fever, septic embolism, history hepatitis-C, asthma, IV drug abuse, followup EXAM: CT CHEST WITH CONTRAST TECHNIQUE: Multidetector CT imaging of the chest was performed during intravenous contrast administration. Sagittal and coronal MPR images reconstructed from axial data set. CONTRAST:  75 mL ISOVUE-300 IOPAMIDOL (ISOVUE-300) INJECTION 61% IV COMPARISON:  07/07/2016 FINDINGS: Cardiovascular: Vascular structures grossly patent on nondedicated exam. RIGHT arm PICC line with tip in SVC near cavoatrial junction. No pericardial effusion. Mediastinum/Nodes: Few scattered normal size mediastinal lymph nodes. No definite thoracic adenopathy. Esophagus unremarkable. Base of cervical region normal appearance. Lungs/Pleura: Multiple cavitary nodules are seen within both lungs consistent with septic emboli. Number of nodules has decreased since the previous exam, with some of the smaller nodule seen previously no longer identified. Remaining nodules are stable to decreased in sizes ; RIGHT upper lobe nodule measured previously at 21 mm now measures 16 mm image 32 and previously measured LEFT upper lobe nodule of 23 mm measures 16 mm at present. BILATERAL pleural effusions with compressive atelectasis of the lower lobes. No pneumothorax. Upper Abdomen: Beam hardening artifacts from patient's RIGHT arm. Spleen appears enlarged. No additional upper abdominal abnormalities seen. Musculoskeletal: No acute osseous findings. IMPRESSION: New BILATERAL pleural effusions with compressive atelectasis of the lower lobes. Cavitary lung nodules bilaterally compatible with septic emboli, decreased in number with remaining nodules stable to decreased in sizes since the previous exam. Electronically Signed   By: Ulyses Southward M.D.   On: 07/19/2016 13:01   Ct Angio Chest Pe W And/or Wo Contrast  Result Date:  07/07/2016 CLINICAL DATA:  Short of breath fever, tachycardia. IV drug abuse. Leukocytosis. EXAM: CT ANGIOGRAPHY CHEST CT ABDOMEN AND PELVIS WITH CONTRAST TECHNIQUE: Multidetector CT imaging of the chest was performed using the standard protocol during bolus administration of intravenous contrast. Multiplanar CT image reconstructions and MIPs were obtained to evaluate the vascular anatomy. Multidetector CT imaging of the abdomen and pelvis was performed using the standard protocol during bolus administration of intravenous contrast. CONTRAST:  100 mL Isovue COMPARISON:  Chest radiograph 07/07/2016 FINDINGS: CTA CHEST FINDINGS Cardiovascular: No filling defects within the pulmonary to suggest acute pulmonary embolism. No acute findings of the aorta great vessels. No pericardial effusion. Heart is grossly normal. Mediastinum/Nodes: No axillary or supraclavicular adenopathy. No mediastinal hilar adenopathy. Ill-defined peribronchial thickening in the hila. Lungs/Pleura: Bilateral thick-walled cavitary nodules in the upper and lower lobes. Consolidated airspace disease in the lower lobes. Approximately 30 nodules per lung. Example nodule  cavitary nodule in the RIGHT upper lobe measures 21 mm (image 27, series 6). Example cavitary nodule in the LEFT upper lobe measures 23 mm on image 30 series 6. Musculoskeletal: No aggressive osseous lesion. Review of the MIP images confirms the above findings. CT ABDOMEN and PELVIS FINDINGS Hepatobiliary: Liver is enlarged. No focal hepatic lesion. Periportal edema noted. Portal veins are patent. Gallbladder wall is edematous and thickened to 6 mm. The gallbladder lumen is contracted. Common bile duct normal caliber. Pancreas: No pancreatic inflammation or duct dilatation Spleen: Spleen is enlarged measuring 14.7 by 7.5 x 12.4 cm (volume = 720 cm^3). Low-density lesion in the spleen measuring 12 mm does not have simple fluid attenuation. Adrenals/Urinary Tract: Adrenal glands are  normal. There is heterogeneous enhancement of the kidneys on the cortical phase imaging (image 32, series 2 for example). On the delayed pyelogram phase imaging the kidneys have a striated appearance (image 14 series 17). No renal obstruction. Bladder normal Stomach/Bowel: Stomach, small bowel, appendix, and cecum are normal. The colon and rectosigmoid colon are normal. Vascular/Lymphatic: Abdominal aorta is normal caliber. No abdominal lymphadenopathy. Reproductive: Uterus and ovaries grossly normal Other: Smaller free fluid in the posterior pelvis Musculoskeletal: No aggressive osseous lesion. Review of the MIP images confirms the above findings. IMPRESSION: Chest Impression: 1. Bilateral thick-walled cavitary nodules consistent with SEPTIC PULMONARY EMBOLI in this population (young patient with IV drug use). 2. Bibasilar consolidation consistent with pneumonia Abdomen / Pelvis Impression: 1. 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 2. Hepatosplenomegaly. Low-density lesion in the spleen is indeterminate. 3. Small volume of intraperitoneal free fluid and periportal edema may relate to hepatic dysfunction. Electronically Signed   By: Genevive Bi M.D.   On: 07/07/2016 09:29   Ct Abdomen Pelvis W Contrast  Result Date: 07/07/2016 CLINICAL DATA:  Short of breath fever, tachycardia. IV drug abuse. Leukocytosis. EXAM: CT ANGIOGRAPHY CHEST CT ABDOMEN AND PELVIS WITH CONTRAST TECHNIQUE: Multidetector CT imaging of the chest was performed using the standard protocol during bolus administration of intravenous contrast. Multiplanar CT image reconstructions and MIPs were obtained to evaluate the vascular anatomy. Multidetector CT imaging of the abdomen and pelvis was performed using the standard protocol during bolus administration of intravenous contrast. CONTRAST:  100 mL Isovue COMPARISON:  Chest radiograph 07/07/2016 FINDINGS: CTA CHEST FINDINGS Cardiovascular: No filling  defects within the pulmonary to suggest acute pulmonary embolism. No acute findings of the aorta great vessels. No pericardial effusion. Heart is grossly normal. Mediastinum/Nodes: No axillary or supraclavicular adenopathy. No mediastinal hilar adenopathy. Ill-defined peribronchial thickening in the hila. Lungs/Pleura: Bilateral thick-walled cavitary nodules in the upper and lower lobes. Consolidated airspace disease in the lower lobes. Approximately 30 nodules per lung. Example nodule cavitary nodule in the RIGHT upper lobe measures 21 mm (image 27, series 6). Example cavitary nodule in the LEFT upper lobe measures 23 mm on image 30 series 6. Musculoskeletal: No aggressive osseous lesion. Review of the MIP images confirms the above findings. CT ABDOMEN and PELVIS FINDINGS Hepatobiliary: Liver is enlarged. No focal hepatic lesion. Periportal edema noted. Portal veins are patent. Gallbladder wall is edematous and thickened to 6 mm. The gallbladder lumen is contracted. Common bile duct normal caliber. Pancreas: No pancreatic inflammation or duct dilatation Spleen: Spleen is enlarged measuring 14.7 by 7.5 x 12.4 cm (volume = 720 cm^3). Low-density lesion in the spleen measuring 12 mm does not have simple fluid attenuation. Adrenals/Urinary Tract: Adrenal glands are normal. There is heterogeneous enhancement of the kidneys on the  cortical phase imaging (image 32, series 2 for example). On the delayed pyelogram phase imaging the kidneys have a striated appearance (image 14 series 17). No renal obstruction. Bladder normal Stomach/Bowel: Stomach, small bowel, appendix, and cecum are normal. The colon and rectosigmoid colon are normal. Vascular/Lymphatic: Abdominal aorta is normal caliber. No abdominal lymphadenopathy. Reproductive: Uterus and ovaries grossly normal Other: Smaller free fluid in the posterior pelvis Musculoskeletal: No aggressive osseous lesion. Review of the MIP images confirms the above findings.  IMPRESSION: Chest Impression: 1. Bilateral thick-walled cavitary nodules consistent with SEPTIC PULMONARY EMBOLI in this population (young patient with IV drug use). 2. Bibasilar consolidation consistent with pneumonia Abdomen / Pelvis Impression: 1. 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 2. Hepatosplenomegaly. Low-density lesion in the spleen is indeterminate. 3. Small volume of intraperitoneal free fluid and periportal edema may relate to hepatic dysfunction. Electronically Signed   By: Genevive Bi M.D.   On: 07/07/2016 09:29   Dg Chest Port 1 View  Result Date: 07/16/2016 CLINICAL DATA:  MRSA bacteremia with septic pulmonary emboli. EXAM: PORTABLE CHEST 1 VIEW COMPARISON:  07/13/2016. FINDINGS: Right PICC tip at the superior cavoatrial junction. Normal sized heart. Mildly increased bilateral airspace opacity and bilateral pleural fluid. Unremarkable bones. IMPRESSION: Mildly increased bilateral pneumonia and pleural fluid. Electronically Signed   By: Beckie Salts M.D.   On: 07/16/2016 09:53   Dg Chest Port 1 View  Result Date: 07/08/2016 CLINICAL DATA:  Patient admitted yesterday with MR SA bacteremia. Possible aspiration today. EXAM: PORTABLE CHEST 1 VIEW COMPARISON:  CT chest and PA and lateral chest 07/07/2014. FINDINGS: Multiple nodular opacities bilaterally again seen. There is increased bibasilar airspace disease. No pneumothorax. Small pleural effusions noted. Heart size is normal. IMPRESSION: Increased bibasilar airspace disease could be due to atelectasis, aspiration and/or pneumonia. Multifocal nodular opacities compatible with septic emboli as seen on chest CT yesterday. Electronically Signed   By: Drusilla Kanner M.D.   On: 07/08/2016 12:07   US Thoracentesis Asp Pleural Space W/img Guide  Result Date: 07/20/2016 INDICATION: Pneumonia, cough, fever, septic embolism, history of hepatitis-C, asthma, IV drug abuse, bilateral pleural effusions seen on  CT scan. Request for diagnostic and therapeutic thoracentesis. EXAM: ULTRASOUND GUIDED RIGHT THORACENTESIS MEDICATIONS: 1% Lidocaine = 10 mL. COMPLICATIONS: None immediate. PROCEDURE: An ultrasound guided thoracentesis was thoroughly discussed with the patient and questions answered. The benefits, risks, alternatives and complications were also discussed. The patient understands and wishes to proceed with the procedure. Written consent was obtained. Ultrasound was performed to localize and mark an adequate pocket of fluid in the right chest. The effusion appeared loculated. The area was then prepped and draped in the normal sterile fashion. 1% Lidocaine was used for local anesthesia. Under ultrasound guidance a 6 Fr Safe-T-Centesis catheter was introduced. Thoracentesis was performed. The catheter was removed and a dressing applied. FINDINGS: A total of approximately 160 mL of blood tinged fluid was removed. Samples were sent to the laboratory as requested by the clinical team. IMPRESSION: Successful ultrasound guided right thoracentesis yielding only 160 mL of pleural fluid due to loculations. Read by:  Corrin Parker, PA-C Electronically Signed   By: Richarda Overlie M.D.   On: 07/20/2016 10:38    Time Spent in minutes 35   Penny Pia M.D on 07/22/2016 at 1:34 PM  Between 7am to 7pm - Pager - [phone]  After 7pm go to [email or web address] - password [facility]  [facility] -  Office  [phone]

## 2016-07-23 LAB — CBC
HEMATOCRIT: 29 % — AB (ref 36.0–46.0)
Hemoglobin: 9 g/dL — ABNORMAL LOW (ref 12.0–15.0)
MCH: 26.5 pg (ref 26.0–34.0)
MCHC: 31 g/dL (ref 30.0–36.0)
MCV: 85.5 fL (ref 78.0–100.0)
Platelets: 318 10*3/uL (ref 150–400)
RBC: 3.39 MIL/uL — ABNORMAL LOW (ref 3.87–5.11)
RDW: 16.4 % — AB (ref 11.5–15.5)
WBC: 11.9 10*3/uL — ABNORMAL HIGH (ref 4.0–10.5)

## 2016-07-23 LAB — BASIC METABOLIC PANEL
ANION GAP: 8 (ref 5–15)
BUN: 11 mg/dL (ref 6–20)
CALCIUM: 8.8 mg/dL — AB (ref 8.9–10.3)
CO2: 28 mmol/L (ref 22–32)
Chloride: 97 mmol/L — ABNORMAL LOW (ref 101–111)
Creatinine, Ser: 0.6 mg/dL (ref 0.44–1.00)
GFR calc Af Amer: >60 mL/min (ref >60)
GFR calc non Af Amer: >60 mL/min (ref >60)
GLUCOSE: 104 mg/dL — AB (ref 65–99)
Potassium: 4.6 mmol/L (ref 3.5–5.1)
Sodium: 133 mmol/L — ABNORMAL LOW (ref 135–145)

## 2016-07-23 LAB — VANCOMYCIN, TROUGH: VANCOMYCIN TR: 18 ug/mL (ref 15–20)

## 2016-07-23 NOTE — Progress Notes (Signed)
Pharmacy Antibiotic Note  Anna Colon is a 36 y.o. 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 Patient continues on vancomycin.   Vancomycin trough on 1/21 was therapeutic at 18 ug/mL, level drawn appropriately. Per ID, vancomycin will be continued until 2/19.   Plan: Continue Vancomycin 750mg  every 8 hours IV Monitor renal function, clinical progress, vancomycin troughs as indicated  F/U OPAT consult to pharmacy before discharge.    Height: 5\' 2"  (157.5 cm) Weight: 120 lb 6.4 oz (54.6 kg) IBW/kg (Calculated) : 50.1  Temp (24hrs), Avg:99.4 F (37.4 C), Min:98 F (36.7 C), Max:101.6 F (38.7 C)   Recent Labs Lab 07/17/16 0541  07/18/16 0415 07/19/16 1411 07/20/16 0413 07/21/16 0452 07/23/16 0409 07/23/16 0946  WBC 13.0*  --  12.2*  --   --   --  11.9*  --   CREATININE 0.63  --  0.69 0.70  --  0.59 0.60  --   VANCOTROUGH  --   < > 30*  --  16  --   --  18  < > = values in this interval not displayed.  Estimated Creatinine Clearance: 76.2 mL/min (by C-G formula based on SCr of 0.6 mg/dL).    No Known Allergies  Antimicrobials this admission: Vanc 1/5 >> (2/19) Zosyn 1/5 >> 1/5  Dose adjustments this admission:  1/6 VT = 15 - on vanc 750 mg IV q 8 hrs 1/8 VT = 18 - on vanc 1g IV q 8 hrs 1/12 VT= 24 drawn from PICC line 1.5hr early - will leave dose alone for now 1/16 @ 0415 measured VT @30  mcg/ml (only 5 hours after evening dose) so corrects to ~24 mcg/ml >> will reduce to 750 mg/8h 1/18 VT 16 - on vanc 750mg  IV Q8H 1/21 VT 18 >> continue vanc 750mg /8h  Microbiology results:  1/5 BCx: Staph aureus **1/5 BCID - MRSA 1/5 UCx: neg 1/5 MRSA PCR: (+) 1/6 resp panel pending 1/6 BCx x 2 > 1/2 MRSA 1/7 BCx x 2 > 1/2 staph aureus 1/9  Bcx >> NGf 1/6 HCV Ab > 11, viral load not calc- spontaneous clearing per ID 1/18 peritoneal fluid: ngtd  Thank you for allowing pharmacy to be a part of this patient's care.  Anna Colon, PharmD Acute Care  Pharmacy Resident  Pager: [phone] 07/23/2016

## 2016-07-23 NOTE — Progress Notes (Signed)
PROGRESS NOTE                                                                                                                                                                                                             Patient Demographics:    Anna Colon, is a 36 y.o. female, DOB - 12/03/1980, ZOX:[id number]  Admit date - 07/07/2016   Admitting Physician Eduard Clos, MD  Outpatient Primary MD for the patient is No PCP Per Patient  LOS - 16  Outpatient Specialists:None  Chief Complaint  Patient presents with  . Vomiting       Brief Narrative   36 year old female with history of asthma, hep C, IV drug use presented to the ED with weakness, fatigue and cough of 2 week duration. Patient had a arrest warrant and was taken to [facility]. She was seen by a physician there who started on antibiotics for her illness. See was that out of the jail that she could go to the hospital and then presented to the ED. 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 Patient reported subjective fever with chills and nausea. She was tachycardic and tachypneic in the ED, afebrile. Chest x-ray concerning for septic emboli. CT angiogram of the chest showed bilateral cavitary nodules consistent with septic pulmonary emboli. Also showed findings concerning for bilateral pyelonephritis. Sepsis pathway initiated and admitted to stepdown unit. Blood cultures growing MRSA.    Subjective:   Still complains of right shoulder pain does not report any itching problems to me.   Assessment  & Plan :    Principal Problem:   MRSA bacteremia With tricuspid valve endocarditis, septic pulmonary emboli and possible septic renal emboli Secondary to IV drug use. Sepsis was improving but again became febrile. Chest x-ray showing worsening pneumonia. Check CT of the chest with contrast. -empiric IV vancomycin.  PICC line  placed for poor IV access. Repeat blood culture negative For growth. Appreciate ID consult. . Will assist with determining duration of antibiotic. Continue telemetry monitoring.  Active Problems:   Sepsis (HCC) Again having fever spikes. Follow CT of the chest reports pleural effusions but no growth on evaluation of pleural fluid. Pt will need antibiotics through February 19th - needs placement.  Pleural effusions - Consulted CT surgery plan is for thoracentesis and  based on results there are further recommendations. Thoracentesis results pending.  Shortness of breath with pleural effusion and symptomatic anemia Received IV Lasix and 1 unit PRBC. Improved after thoracentesis  Generalized pain Possibly due to septic emboli and? Withdrawal from narcotics. Discontinued NSAIDs given anemia.Marland Kitchen  on oxycodone IR and low-dose Dilaudid. Added clonidine with good response.  Right shoulder pain Reports ongoing since admission (says the police may have dislocated her shoulder when they handcuffed her). Order Lidoderm patch. X-ray of the shoulder unremarkable. Pt reports pain over trapezius, deltoid (posterior) bicep, tricep, forearm. Exam really inconsistent with story. I suspect there may be a psych component to this. Will add Meloxicam to assist with anti inflammatory and with pain. If psychiatric component however meloxicam will not help.   IVDU (intravenous drug user) Patient plans to quit. Father concerned about visitors coming to meet her and thinks that they will encourage her to use drugs.   Tobacco abuse Counseled on cessation. nicotine patch.    ETOH abuse No signs of withdrawal. Monitor on CIWA. Added low dose clonidine    Thrombocytopenia (HCC) On admission secondary to sepsis. Resolved.    Endocarditis of tricuspid valve Continue IV antibiotic. Duration per ID. No signs of acute heart failure.  Iron deficiency anemia Added supplement   Hypophosphatemia Replenished. Check  phosphorus in the morning.  Hepatitis C Secondary to IV drug use. Viral load negative. Suggests  spontaneous clearing  Sacral pressure ulcer Wound care consulted   Code Status : Full code  Family Communication  : father at bedside.  Disposition Plan  :  Currently inpatient. Father drives a truck and usually out of home. He is worried that if he takes her home she will be by herself and will again have her boyfriend and other people coming at home and she will be back on drugs. Reports that this has happened before. He is hoping if she will qualify to go to skilled nursing facility. Social work following. Plan on discharge if patient remains afebrile.  Barriers For Discharge : Active symptoms  Consults  : Infectious disease  Procedures  :  CT angiogram of the chest CT abdomen and pelvis 2-D echo  DVT Prophylaxis  :  Lovenox   Lab Results  Component Value Date   PLT 318 07/23/2016    Antibiotics  :   Anti-infectives    Start     Dose/Rate Route Frequency Ordered Stop   07/18/16 1200  vancomycin (VANCOCIN) IVPB 750 mg/150 ml premix     750 mg 150 mL/hr over 60 Minutes Intravenous Every 8 hours 07/18/16 1002     07/08/16 1515  vancomycin (VANCOCIN) IVPB 1000 mg/200 mL premix  Status:  Discontinued     1,000 mg 200 mL/hr over 60 Minutes Intravenous Every 8 hours 07/08/16 1501 07/18/16 0556   07/07/16 2200  piperacillin-tazobactam (ZOSYN) IVPB 3.375 g  Status:  Discontinued     3.375 g 12.5 mL/hr over 240 Minutes Intravenous Every 8 hours 07/07/16 1605 07/07/16 1754   07/07/16 1615  vancomycin (VANCOCIN) IVPB 750 mg/150 ml premix  Status:  Discontinued     750 mg 150 mL/hr over 60 Minutes Intravenous Every 8 hours 07/07/16 1605 07/08/16 1501   07/07/16 0813  piperacillin-tazobactam (ZOSYN) IVPB 3.375 g     3.375 g 100 mL/hr over 30 Minutes Intravenous  Once 07/07/16 0644 07/07/16 0839   07/07/16 0215  piperacillin-tazobactam (ZOSYN) IVPB 3.375 g     3.375 g 100 mL/hr  over 30 Minutes  Intravenous  Once 07/07/16 0213 07/07/16 0313   07/07/16 0215  vancomycin (VANCOCIN) IVPB 1000 mg/200 mL premix     1,000 mg 200 mL/hr over 60 Minutes Intravenous  Once 07/07/16 0213 07/07/16 0343        Objective:   Vitals:   07/23/16 0108 07/23/16 0400 07/23/16 0637 07/23/16 0946  BP:   112/73 122/75  Pulse:   87 91  Resp:   17 18  Temp: 100.1 F (37.8 C)  98 F (36.7 C) 98.7 F (37.1 C)  TempSrc: Oral  Oral Oral  SpO2:   100% 99%  Weight:  54.6 kg (120 lb 6.4 oz)    Height:        Wt Readings from Last 3 Encounters:  07/23/16 54.6 kg (120 lb 6.4 oz)     Intake/Output Summary (Last 24 hours) at 07/23/16 1552 Last data filed at 07/23/16 0900  Gross per 24 hour  Intake              750 ml  Output             1250 ml  Net             -500 ml     Physical Exam  Gen: alert and awake, in nad. Reports pain HEENT:  Pallor present, moist mucosa, supple neck Chest: CTA BL, no wheezes, equal chest rise. CVS:S1&S2 regular, no murmurs  GI: soft, ND , nontender Musculoskeletal: warm, no edema, does not let me examine her right arm and shoulder well CNS: Alert and oriented    Data Review:    CBC  Recent Labs Lab 07/17/16 0541 07/18/16 0415 07/23/16 0409  WBC 13.0* 12.2* 11.9*  HGB 8.6* 9.1* 9.0*  HCT 26.5* 28.9* 29.0*  PLT 299 260 318  MCV 84.1 84.8 85.5  MCH 27.3 26.7 26.5  MCHC 32.5 31.5 31.0  RDW 16.2* 16.4* 16.4*    Chemistries   Recent Labs Lab 07/17/16 0541 07/18/16 0415 07/19/16 1411 07/21/16 0452 07/23/16 0409  NA 132*  --   --   --  133*  K 4.3  --   --   --  4.6  CL 96*  --   --   --  97*  CO2 28  --   --   --  28  GLUCOSE 94  --   --   --  104*  BUN 9  --   --   --  11  CREATININE 0.63 0.69 0.70 0.59 0.60  CALCIUM 8.3*  --   --   --  8.8*   ------------------------------------------------------------------------------------------------------------------ No results for input(s): CHOL, HDL, LDLCALC, TRIG, CHOLHDL,  LDLDIRECT in the last 72 hours.  No results found for: HGBA1C ------------------------------------------------------------------------------------------------------------------ No results for input(s): TSH, T4TOTAL, T3FREE, THYROIDAB in the last 72 hours.  Invalid input(s): FREET3 ------------------------------------------------------------------------------------------------------------------ No results for input(s): VITAMINB12, FOLATE, FERRITIN, TIBC, IRON, RETICCTPCT in the last 72 hours.  Coagulation profile No results for input(s): INR, PROTIME in the last 168 hours.  No results for input(s): DDIMER in the last 72 hours.  Cardiac Enzymes No results for input(s): CKMB, TROPONINI, MYOGLOBIN in the last 168 hours.  Invalid input(s): CK ------------------------------------------------------------------------------------------------------------------ No results found for: BNP  Inpatient Medications  Scheduled Meds: . cloNIDine  0.1 mg Oral BID  . enoxaparin (LOVENOX) injection  40 mg Subcutaneous Q24H  . feeding supplement (ENSURE ENLIVE)  237 mL Oral BID BM  . ferrous sulfate  325 mg Oral BID WC  .  folic acid  1 mg Oral Daily  . lidocaine  1 patch Transdermal Q24H  . meloxicam  15 mg Oral Daily  . multivitamin with minerals  1 tablet Oral Daily  . nicotine  21 mg Transdermal Daily  . sodium chloride flush  10-40 mL Intracatheter Q12H  . thiamine  100 mg Oral Daily   Or  . thiamine  100 mg Intravenous Daily  . triamcinolone ointment   Topical BID  . vancomycin  750 mg Intravenous Q8H   Continuous Infusions:  PRN Meds:.acetaminophen **OR** acetaminophen, albuterol, hydrALAZINE, HYDROmorphone (DILAUDID) injection, ondansetron **OR** ondansetron (ZOFRAN) IV, oxyCODONE, polyethylene glycol, sodium chloride flush, zolpidem  Micro Results Recent Results (from the past 240 hour(s))  Culture, body fluid-bottle     Status: None (Preliminary result)   Collection Time: 07/20/16  11:27 AM  Result Value Ref Range Status   Specimen Description PERITONEAL  Final   Special Requests NONE  Final   Culture NO GROWTH 3 DAYS  Final   Report Status PENDING  Incomplete  Gram stain     Status: None   Collection Time: 07/20/16 11:27 AM  Result Value Ref Range Status   Specimen Description PERITONEAL  Final   Special Requests NONE  Final   Gram Stain   Final    FEW WBC PRESENT,BOTH PMN AND MONONUCLEAR NO ORGANISMS SEEN    Report Status 07/20/2016 FINAL  Final    Radiology Reports Dg Orthopantogram  Result Date: 07/20/2016 CLINICAL DATA:  Sepsis.  Tooth abscess. EXAM: ORTHOPANTOGRAM/PANORAMIC COMPARISON:  CT 02/18/2011. FINDINGS: No acute bony abnormality identified. No evidence of fracture or dislocation. Periapical lucencies noted throughout the remaining teeth along the mandible suggesting dental disease. No prominent lytic lesion noted. No fracture . If further evaluation is needed maxillofacial CT can be obtained. IMPRESSION: Muscle mild periapical lucencies are noted throughout the remaining teeth of the lower mandible. No prominent lytic lesion noted. Electronically Signed   By: Maisie Fus  Register   On: 07/20/2016 11:19   Dg Chest 1 View  Result Date: 07/20/2016 CLINICAL DATA:  Status post right thoracentesis. EXAM: CHEST 1 VIEW COMPARISON:  07/16/2016 FINDINGS: Right arm PICC line tip is in the projection of the cavoatrial junction. Normal heart size. Bilateral pleural effusions are identified, left greater than right. There is mild interstitial edema. Persistent bilateral mid lung and lower lobe opacities. IMPRESSION: 1. No change in bilateral pleural effusions and bilateral lung opacities. Electronically Signed   By: Signa Kell M.D.   On: 07/20/2016 11:08   Dg Chest 2 View  Result Date: 07/21/2016 CLINICAL DATA:  Sepsis. EXAM: CHEST  2 VIEW COMPARISON:  07/20/2016. FINDINGS: Right PICC line stable position. Heart size stable. Multifocal bilateral pulmonary  infiltrates and bilateral pleural effusions again noted. Pulmonary infiltrates have progressed slightly. IMPRESSION: 1.  Right PICC line stable position. 2. Progressive multifocal bilateral pulmonary infiltrates. Persistent bilateral pleural effusions. Electronically Signed   By: Maisie Fus  Register   On: 07/21/2016 12:03   Dg Chest 2 View  Result Date: 07/13/2016 CLINICAL DATA:  Pneumonia EXAM: CHEST  2 VIEW COMPARISON:  07/08/2016 FINDINGS: Bilateral pleural effusions, left greater than right with lower lobe airspace opacities, also worse on the left. This is improved somewhat on the right, not significantly changed on the left. Right PICC line is in place with the tip at the cavoatrial junction. Low lung volumes. Heart is normal size. IMPRESSION: Bilateral lower lobe airspace opacities, left greater than right. Some improvement on the right since prior study  with no change on the left. Small bilateral effusions. Electronically Signed   By: Charlett Nose M.D.   On: 07/13/2016 13:19   Dg Chest 2 View  Result Date: 07/07/2016 CLINICAL DATA:  Nausea vomiting and diarrhea for 8 days EXAM: CHEST  2 VIEW COMPARISON:  None. FINDINGS: Numerous ill-defined nodules throughout both lungs, many cavitary. No confluent consolidation. No large effusion. Hilar, mediastinal and cardiac contours are unremarkable. IMPRESSION: Innumerable nodules, many cavitary. Septic emboli are a leading consideration. Noninfectious inflammatory process or neoplastic process are less likely possibilities. Electronically Signed   By: Ellery Plunk M.D.   On: 07/07/2016 01:55   Dg Shoulder Right  Result Date: 07/18/2016 CLINICAL DATA:  Right shoulder pain with fever and sepsis. EXAM: RIGHT SHOULDER - 2+ VIEW COMPARISON:  Chest x-ray 07/16/2016 FINDINGS: Right-sided PICC line unchanged. Bones, joint spaces and soft tissues over the right shoulder are within normal. Stable changes within the right lung. IMPRESSION: Unremarkable right  shoulder. Electronically Signed   By: Elberta Fortis M.D.   On: 07/18/2016 10:49   Ct Chest W Contrast  Result Date: 07/19/2016 CLINICAL DATA:  Pneumonia, cough, fever, septic embolism, history hepatitis-C, asthma, IV drug abuse, followup EXAM: CT CHEST WITH CONTRAST TECHNIQUE: Multidetector CT imaging of the chest was performed during intravenous contrast administration. Sagittal and coronal MPR images reconstructed from axial data set. CONTRAST:  75 mL ISOVUE-300 IOPAMIDOL (ISOVUE-300) INJECTION 61% IV COMPARISON:  07/07/2016 FINDINGS: Cardiovascular: Vascular structures grossly patent on nondedicated exam. RIGHT arm PICC line with tip in SVC near cavoatrial junction. No pericardial effusion. Mediastinum/Nodes: Few scattered normal size mediastinal lymph nodes. No definite thoracic adenopathy. Esophagus unremarkable. Base of cervical region normal appearance. Lungs/Pleura: Multiple cavitary nodules are seen within both lungs consistent with septic emboli. Number of nodules has decreased since the previous exam, with some of the smaller nodule seen previously no longer identified. Remaining nodules are stable to decreased in sizes ; RIGHT upper lobe nodule measured previously at 21 mm now measures 16 mm image 32 and previously measured LEFT upper lobe nodule of 23 mm measures 16 mm at present. BILATERAL pleural effusions with compressive atelectasis of the lower lobes. No pneumothorax. Upper Abdomen: Beam hardening artifacts from patient's RIGHT arm. Spleen appears enlarged. No additional upper abdominal abnormalities seen. Musculoskeletal: No acute osseous findings. IMPRESSION: New BILATERAL pleural effusions with compressive atelectasis of the lower lobes. Cavitary lung nodules bilaterally compatible with septic emboli, decreased in number with remaining nodules stable to decreased in sizes since the previous exam. Electronically Signed   By: Ulyses Southward M.D.   On: 07/19/2016 13:01   Ct Angio Chest Pe W  And/or Wo Contrast  Result Date: 07/07/2016 CLINICAL DATA:  Short of breath fever, tachycardia. IV drug abuse. Leukocytosis. EXAM: CT ANGIOGRAPHY CHEST CT ABDOMEN AND PELVIS WITH CONTRAST TECHNIQUE: Multidetector CT imaging of the chest was performed using the standard protocol during bolus administration of intravenous contrast. Multiplanar CT image reconstructions and MIPs were obtained to evaluate the vascular anatomy. Multidetector CT imaging of the abdomen and pelvis was performed using the standard protocol during bolus administration of intravenous contrast. CONTRAST:  100 mL Isovue COMPARISON:  Chest radiograph 07/07/2016 FINDINGS: CTA CHEST FINDINGS Cardiovascular: No filling defects within the pulmonary to suggest acute pulmonary embolism. No acute findings of the aorta great vessels. No pericardial effusion. Heart is grossly normal. Mediastinum/Nodes: No axillary or supraclavicular adenopathy. No mediastinal hilar adenopathy. Ill-defined peribronchial thickening in the hila. Lungs/Pleura: Bilateral thick-walled cavitary nodules in the upper  and lower lobes. Consolidated airspace disease in the lower lobes. Approximately 30 nodules per lung. Example nodule cavitary nodule in the RIGHT upper lobe measures 21 mm (image 27, series 6). Example cavitary nodule in the LEFT upper lobe measures 23 mm on image 30 series 6. Musculoskeletal: No aggressive osseous lesion. Review of the MIP images confirms the above findings. CT ABDOMEN and PELVIS FINDINGS Hepatobiliary: Liver is enlarged. No focal hepatic lesion. Periportal edema noted. Portal veins are patent. Gallbladder wall is edematous and thickened to 6 mm. The gallbladder lumen is contracted. Common bile duct normal caliber. Pancreas: No pancreatic inflammation or duct dilatation Spleen: Spleen is enlarged measuring 14.7 by 7.5 x 12.4 cm (volume = 720 cm^3). Low-density lesion in the spleen measuring 12 mm does not have simple fluid attenuation.  Adrenals/Urinary Tract: Adrenal glands are normal. There is heterogeneous enhancement of the kidneys on the cortical phase imaging (image 32, series 2 for example). On the delayed pyelogram phase imaging the kidneys have a striated appearance (image 14 series 17). No renal obstruction. Bladder normal Stomach/Bowel: Stomach, small bowel, appendix, and cecum are normal. The colon and rectosigmoid colon are normal. Vascular/Lymphatic: Abdominal aorta is normal caliber. No abdominal lymphadenopathy. Reproductive: Uterus and ovaries grossly normal Other: Smaller free fluid in the posterior pelvis Musculoskeletal: No aggressive osseous lesion. Review of the MIP images confirms the above findings. IMPRESSION: Chest Impression: 1. Bilateral thick-walled cavitary nodules consistent with SEPTIC PULMONARY EMBOLI in this population (young patient with IV drug use). 2. Bibasilar consolidation consistent with pneumonia Abdomen / Pelvis Impression: 1. 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 2. Hepatosplenomegaly. Low-density lesion in the spleen is indeterminate. 3. Small volume of intraperitoneal free fluid and periportal edema may relate to hepatic dysfunction. Electronically Signed   By: Genevive Bi M.D.   On: 07/07/2016 09:29   Ct Abdomen Pelvis W Contrast  Result Date: 07/07/2016 CLINICAL DATA:  Short of breath fever, tachycardia. IV drug abuse. Leukocytosis. EXAM: CT ANGIOGRAPHY CHEST CT ABDOMEN AND PELVIS WITH CONTRAST TECHNIQUE: Multidetector CT imaging of the chest was performed using the standard protocol during bolus administration of intravenous contrast. Multiplanar CT image reconstructions and MIPs were obtained to evaluate the vascular anatomy. Multidetector CT imaging of the abdomen and pelvis was performed using the standard protocol during bolus administration of intravenous contrast. CONTRAST:  100 mL Isovue COMPARISON:  Chest radiograph 07/07/2016 FINDINGS: CTA CHEST  FINDINGS Cardiovascular: No filling defects within the pulmonary to suggest acute pulmonary embolism. No acute findings of the aorta great vessels. No pericardial effusion. Heart is grossly normal. Mediastinum/Nodes: No axillary or supraclavicular adenopathy. No mediastinal hilar adenopathy. Ill-defined peribronchial thickening in the hila. Lungs/Pleura: Bilateral thick-walled cavitary nodules in the upper and lower lobes. Consolidated airspace disease in the lower lobes. Approximately 30 nodules per lung. Example nodule cavitary nodule in the RIGHT upper lobe measures 21 mm (image 27, series 6). Example cavitary nodule in the LEFT upper lobe measures 23 mm on image 30 series 6. Musculoskeletal: No aggressive osseous lesion. Review of the MIP images confirms the above findings. CT ABDOMEN and PELVIS FINDINGS Hepatobiliary: Liver is enlarged. No focal hepatic lesion. Periportal edema noted. Portal veins are patent. Gallbladder wall is edematous and thickened to 6 mm. The gallbladder lumen is contracted. Common bile duct normal caliber. Pancreas: No pancreatic inflammation or duct dilatation Spleen: Spleen is enlarged measuring 14.7 by 7.5 x 12.4 cm (volume = 720 cm^3). Low-density lesion in the spleen measuring 12 mm does not have simple  fluid attenuation. Adrenals/Urinary Tract: Adrenal glands are normal. There is heterogeneous enhancement of the kidneys on the cortical phase imaging (image 32, series 2 for example). On the delayed pyelogram phase imaging the kidneys have a striated appearance (image 14 series 17). No renal obstruction. Bladder normal Stomach/Bowel: Stomach, small bowel, appendix, and cecum are normal. The colon and rectosigmoid colon are normal. Vascular/Lymphatic: Abdominal aorta is normal caliber. No abdominal lymphadenopathy. Reproductive: Uterus and ovaries grossly normal Other: Smaller free fluid in the posterior pelvis Musculoskeletal: No aggressive osseous lesion. Review of the MIP images  confirms the above findings. IMPRESSION: Chest Impression: 1. Bilateral thick-walled cavitary nodules consistent with SEPTIC PULMONARY EMBOLI in this population (young patient with IV drug use). 2. Bibasilar consolidation consistent with pneumonia Abdomen / Pelvis Impression: 1. 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 2. Hepatosplenomegaly. Low-density lesion in the spleen is indeterminate. 3. Small volume of intraperitoneal free fluid and periportal edema may relate to hepatic dysfunction. Electronically Signed   By: Genevive Bi M.D.   On: 07/07/2016 09:29   Dg Chest Port 1 View  Result Date: 07/16/2016 CLINICAL DATA:  MRSA bacteremia with septic pulmonary emboli. EXAM: PORTABLE CHEST 1 VIEW COMPARISON:  07/13/2016. FINDINGS: Right PICC tip at the superior cavoatrial junction. Normal sized heart. Mildly increased bilateral airspace opacity and bilateral pleural fluid. Unremarkable bones. IMPRESSION: Mildly increased bilateral pneumonia and pleural fluid. Electronically Signed   By: Beckie Salts M.D.   On: 07/16/2016 09:53   Dg Chest Port 1 View  Result Date: 07/08/2016 CLINICAL DATA:  Patient admitted yesterday with MR SA bacteremia. Possible aspiration today. EXAM: PORTABLE CHEST 1 VIEW COMPARISON:  CT chest and PA and lateral chest 07/07/2014. FINDINGS: Multiple nodular opacities bilaterally again seen. There is increased bibasilar airspace disease. No pneumothorax. Small pleural effusions noted. Heart size is normal. IMPRESSION: Increased bibasilar airspace disease could be due to atelectasis, aspiration and/or pneumonia. Multifocal nodular opacities compatible with septic emboli as seen on chest CT yesterday. Electronically Signed   By: Drusilla Kanner M.D.   On: 07/08/2016 12:07   US Thoracentesis Asp Pleural Space W/img Guide  Result Date: 07/20/2016 INDICATION: Pneumonia, cough, fever, septic embolism, history of hepatitis-C, asthma, IV drug abuse,  bilateral pleural effusions seen on CT scan. Request for diagnostic and therapeutic thoracentesis. EXAM: ULTRASOUND GUIDED RIGHT THORACENTESIS MEDICATIONS: 1% Lidocaine = 10 mL. COMPLICATIONS: None immediate. PROCEDURE: An ultrasound guided thoracentesis was thoroughly discussed with the patient and questions answered. The benefits, risks, alternatives and complications were also discussed. The patient understands and wishes to proceed with the procedure. Written consent was obtained. Ultrasound was performed to localize and mark an adequate pocket of fluid in the right chest. The effusion appeared loculated. The area was then prepped and draped in the normal sterile fashion. 1% Lidocaine was used for local anesthesia. Under ultrasound guidance a 6 Fr Safe-T-Centesis catheter was introduced. Thoracentesis was performed. The catheter was removed and a dressing applied. FINDINGS: A total of approximately 160 mL of blood tinged fluid was removed. Samples were sent to the laboratory as requested by the clinical team. IMPRESSION: Successful ultrasound guided right thoracentesis yielding only 160 mL of pleural fluid due to loculations. Read by:  Corrin Parker, PA-C Electronically Signed   By: Richarda Overlie M.D.   On: 07/20/2016 10:38    Time Spent in minutes 35   Penny Pia M.D on 07/23/2016 at 3:52 PM  Between 7am to 7pm - Pager - [phone]  After 7pm go to  [email or web address] - password [facility]  [facility] -  Office  [phone]

## 2016-07-24 ENCOUNTER — Encounter (HOSPITAL_COMMUNITY): Payer: Self-pay | Admitting: Dentistry

## 2016-07-24 DIAGNOSIS — K083 Retained dental root: Secondary | ICD-10-CM

## 2016-07-24 DIAGNOSIS — K036 Deposits [accretions] on teeth: Secondary | ICD-10-CM

## 2016-07-24 DIAGNOSIS — K029 Dental caries, unspecified: Secondary | ICD-10-CM

## 2016-07-24 DIAGNOSIS — M264 Malocclusion, unspecified: Secondary | ICD-10-CM

## 2016-07-24 DIAGNOSIS — K0530 Chronic periodontitis, unspecified: Secondary | ICD-10-CM

## 2016-07-24 DIAGNOSIS — K045 Chronic apical periodontitis: Secondary | ICD-10-CM

## 2016-07-24 MED ORDER — ENOXAPARIN SODIUM 40 MG/0.4ML ~~LOC~~ SOLN
40.0000 mg | SUBCUTANEOUS | Status: AC
  Administered 2016-07-24: 40 mg via SUBCUTANEOUS
  Filled 2016-07-24: qty 0.4

## 2016-07-24 NOTE — Progress Notes (Signed)
Nutrition Follow-up  DOCUMENTATION CODES:   Not applicable  INTERVENTION:  Continue Ensure Enlive po BID, each supplement provides 350 kcal and 20 grams of protein  Encourage adequate PO intake.   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   Meal completion has been varied from 25-80% with most intake at least 50%. Pt currently has Ensure ordered and has been consuming them. RD to continue with current orders. Pt encouraged to eat her foods at meals and to drink her supplements.   Diet Order:  Diet regular Room service appropriate? Yes; Fluid consistency: Thin  Skin:  Wound (see comment) (Stage 2 to sacrum)  Last BM:  1/21  Height:   Ht Readings from Last 1 Encounters:  07/19/16 '5\' 2"'$  (1.575 m)    Weight:   Wt Readings from Last 1 Encounters:  07/23/16 120 lb 4.8 oz (54.6 kg)    Ideal Body Weight:  50 kg  BMI:  Body mass index is 22 kg/m.  Estimated Nutritional Needs:   Kcal:  1700-1900  Protein:  80-90 grams  Fluid:  1.7 - 1.9 L/day  EDUCATION NEEDS:   No education needs identified at this time  Corrin Parker, MS, RD, LDN Pager # [phone] After hours/ weekend pager # [phone]

## 2016-07-24 NOTE — Progress Notes (Addendum)
Paged Dr. Kristin BruinsKulinski  about father wanting to talk to him about surgery tomorrow. Dr. Kristin BruinsKulinski called father and talked to him about surgery.   Anna ReevesNatalie N Gwendolen Hewlett, RN

## 2016-07-24 NOTE — Progress Notes (Signed)
PROGRESS NOTE                                                                                                                                                                                                             Patient Demographics:    Anna Colon, is a 36 y.o. female, DOB - 1980/07/12, JYN:[id number]  Admit date - 07/07/2016   Admitting Physician Eduard Clos, MD  Outpatient Primary MD for the patient is No PCP Per Patient  LOS - 17  Outpatient Specialists:None  Chief Complaint  Patient presents with  . Vomiting       Brief Narrative   36 year old female with history of asthma, hep C, IV drug use presented to the ED with weakness, fatigue and cough of 2 week duration. Patient had a arrest warrant and was taken to [place] jail. She was seen by a physician there who started on antibiotics for her illness. See was that out of the jail that she could go to the hospital and then presented to the ED. 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 Patient reported subjective fever with chills and nausea. She was tachycardic and tachypneic in the ED, afebrile. Chest x-ray concerning for septic emboli. CT angiogram of the chest showed bilateral cavitary nodules consistent with septic pulmonary emboli. Also showed findings concerning for bilateral pyelonephritis. Sepsis pathway initiated and admitted to stepdown unit. Blood cultures growing MRSA.    Subjective:   Reports that shoulder discomfort improving.   Assessment  & Plan :    Principal Problem:   MRSA bacteremia With tricuspid valve endocarditis, septic pulmonary emboli and possible septic renal emboli Secondary to IV drug use. Sepsis was improving but again became febrile. Chest x-ray showing worsening pneumonia. Check CT of the chest with contrast. -empiric IV vancomycin.  PICC line placed for poor IV access. Repeat blood  culture negative For growth. Appreciate ID consult.  - Plan is for antibiotic treatment through February 19th Continue telemetry monitoring.  Active Problems:   Sepsis (HCC) Again having fever spikes. Follow CT of the chest reports pleural effusions but no growth on evaluation of pleural fluid. Pt will need antibiotics through February 19th - needs placement.  Pleural effusions - Consulted CT surgery plan is for thoracentesis and based on results there are further recommendations.  Thoracentesis results pending.  Shortness of breath with pleural effusion and symptomatic anemia Received IV Lasix and 1 unit PRBC. Improved after thoracentesis  Generalized pain Possibly due to septic emboli and? Withdrawal from narcotics. Discontinued NSAIDs given anemia.Marland Kitchen  on oxycodone IR and low-dose Dilaudid. Added clonidine with good response.  Right shoulder pain Reports ongoing since admission (says the police may have dislocated her shoulder when they handcuffed her). Order Lidoderm patch. X-ray of the shoulder unremarkable. Pt reports pain over trapezius, deltoid (posterior) bicep, tricep, forearm. Exam really inconsistent with story. I suspect there may be a psych component to this. Will add Meloxicam to assist with anti inflammatory and with pain. If psychiatric component however meloxicam will not help.   IVDU (intravenous drug user) Patient plans to quit. Father concerned about visitors coming to meet her and thinks that they will encourage her to use drugs.   Tobacco abuse Counseled on cessation. nicotine patch.    ETOH abuse No signs of withdrawal. Monitor on CIWA. Added low dose clonidine    Thrombocytopenia (HCC) On admission secondary to sepsis. Resolved.    Endocarditis of tricuspid valve Continue IV antibiotic. Duration per ID. No signs of acute heart failure.  Iron deficiency anemia Added supplement   Hypophosphatemia Replenished. Check phosphorus in the morning.  Hepatitis  C Secondary to IV drug use. Viral load negative. Suggests  spontaneous clearing  Sacral pressure ulcer Wound care consulted   Code Status : Full code  Family Communication  : father at bedside.  Disposition Plan  :  Currently inpatient. Father drives a truck and usually out of home. He is worried that if he takes her home she will be by herself and will again have her boyfriend and other people coming at home and she will be back on drugs. Reports that this has happened before. He is hoping if she will qualify to go to skilled nursing facility. Social work following. Plan on discharge if patient remains afebrile.  Barriers For Discharge : Active symptoms  Consults  : Infectious disease  Procedures  :  CT angiogram of the chest CT abdomen and pelvis 2-D echo  DVT Prophylaxis  :  Lovenox   Lab Results  Component Value Date   PLT 318 07/23/2016    Antibiotics  :   Anti-infectives    Start     Dose/Rate Route Frequency Ordered Stop   07/18/16 1200  vancomycin (VANCOCIN) IVPB 750 mg/150 ml premix     750 mg 150 mL/hr over 60 Minutes Intravenous Every 8 hours 07/18/16 1002     07/08/16 1515  vancomycin (VANCOCIN) IVPB 1000 mg/200 mL premix  Status:  Discontinued     1,000 mg 200 mL/hr over 60 Minutes Intravenous Every 8 hours 07/08/16 1501 07/18/16 0556   07/07/16 2200  piperacillin-tazobactam (ZOSYN) IVPB 3.375 g  Status:  Discontinued     3.375 g 12.5 mL/hr over 240 Minutes Intravenous Every 8 hours 07/07/16 1605 07/07/16 1754   07/07/16 1615  vancomycin (VANCOCIN) IVPB 750 mg/150 ml premix  Status:  Discontinued     750 mg 150 mL/hr over 60 Minutes Intravenous Every 8 hours 07/07/16 1605 07/08/16 1501   07/07/16 0813  piperacillin-tazobactam (ZOSYN) IVPB 3.375 g     3.375 g 100 mL/hr over 30 Minutes Intravenous  Once 07/07/16 0644 07/07/16 0839   07/07/16 0215  piperacillin-tazobactam (ZOSYN) IVPB 3.375 g     3.375 g 100 mL/hr over 30 Minutes Intravenous  Once  07/07/16 0213 07/07/16 0313  07/07/16 0215  vancomycin (VANCOCIN) IVPB 1000 mg/200 mL premix     1,000 mg 200 mL/hr over 60 Minutes Intravenous  Once 07/07/16 0213 07/07/16 0343        Objective:   Vitals:   07/23/16 0946 07/23/16 2212 07/24/16 0600 07/24/16 0929  BP: 122/75 116/79 99/64 121/83  Pulse: 91 82 87 98  Resp: 18 16 16 18   Temp: 98.7 F (37.1 C) 98.3 F (36.8 C) 98.8 F (37.1 C) 98.5 F (36.9 C)  TempSrc: Oral Oral Oral Oral  SpO2: 99% 98% 99% 99%  Weight:  54.6 kg (120 lb 4.8 oz)    Height:        Wt Readings from Last 3 Encounters:  07/23/16 54.6 kg (120 lb 4.8 oz)     Intake/Output Summary (Last 24 hours) at 07/24/16 1404 Last data filed at 07/24/16 0900  Gross per 24 hour  Intake             2520 ml  Output              600 ml  Net             1920 ml     Physical Exam  Gen: alert and awake, in nad. Reports pain HEENT:  Pallor present, moist mucosa, supple neck Chest: CTA BL, no wheezes, equal chest rise. CVS:S1&S2 regular, no murmurs  GI: soft, ND , nontender Musculoskeletal: warm, no edema, does not let me examine her right arm and shoulder well CNS: Alert and oriented    Data Review:    CBC  Recent Labs Lab 07/18/16 0415 07/23/16 0409  WBC 12.2* 11.9*  HGB 9.1* 9.0*  HCT 28.9* 29.0*  PLT 260 318  MCV 84.8 85.5  MCH 26.7 26.5  MCHC 31.5 31.0  RDW 16.4* 16.4*    Chemistries   Recent Labs Lab 07/18/16 0415 07/19/16 1411 07/21/16 0452 07/23/16 0409  NA  --   --   --  133*  K  --   --   --  4.6  CL  --   --   --  97*  CO2  --   --   --  28  GLUCOSE  --   --   --  104*  BUN  --   --   --  11  CREATININE 0.69 0.70 0.59 0.60  CALCIUM  --   --   --  8.8*   ------------------------------------------------------------------------------------------------------------------ No results for input(s): CHOL, HDL, LDLCALC, TRIG, CHOLHDL, LDLDIRECT in the last 72 hours.  No results found for:  HGBA1C ------------------------------------------------------------------------------------------------------------------ No results for input(s): TSH, T4TOTAL, T3FREE, THYROIDAB in the last 72 hours.  Invalid input(s): FREET3 ------------------------------------------------------------------------------------------------------------------ No results for input(s): VITAMINB12, FOLATE, FERRITIN, TIBC, IRON, RETICCTPCT in the last 72 hours.  Coagulation profile No results for input(s): INR, PROTIME in the last 168 hours.  No results for input(s): DDIMER in the last 72 hours.  Cardiac Enzymes No results for input(s): CKMB, TROPONINI, MYOGLOBIN in the last 168 hours.  Invalid input(s): CK ------------------------------------------------------------------------------------------------------------------ No results found for: BNP  Inpatient Medications  Scheduled Meds: . cloNIDine  0.1 mg Oral BID  . enoxaparin (LOVENOX) injection  40 mg Subcutaneous Q24H  . feeding supplement (ENSURE ENLIVE)  237 mL Oral BID BM  . ferrous sulfate  325 mg Oral BID WC  . folic acid  1 mg Oral Daily  . lidocaine  1 patch Transdermal Q24H  . meloxicam  15 mg Oral Daily  .  multivitamin with minerals  1 tablet Oral Daily  . nicotine  21 mg Transdermal Daily  . sodium chloride flush  10-40 mL Intracatheter Q12H  . thiamine  100 mg Oral Daily  . triamcinolone ointment   Topical BID  . vancomycin  750 mg Intravenous Q8H   Continuous Infusions:  PRN Meds:.acetaminophen **OR** acetaminophen, albuterol, hydrALAZINE, HYDROmorphone (DILAUDID) injection, ondansetron **OR** ondansetron (ZOFRAN) IV, oxyCODONE, polyethylene glycol, sodium chloride flush, zolpidem  Micro Results Recent Results (from the past 240 hour(s))  Culture, body fluid-bottle     Status: None (Preliminary result)   Collection Time: 07/20/16 11:27 AM  Result Value Ref Range Status   Specimen Description PERITONEAL  Final   Special Requests  NONE  Final   Culture NO GROWTH 3 DAYS  Final   Report Status PENDING  Incomplete  Gram stain     Status: None   Collection Time: 07/20/16 11:27 AM  Result Value Ref Range Status   Specimen Description PERITONEAL  Final   Special Requests NONE  Final   Gram Stain   Final    FEW WBC PRESENT,BOTH PMN AND MONONUCLEAR NO ORGANISMS SEEN    Report Status 07/20/2016 FINAL  Final    Radiology Reports Dg Orthopantogram  Result Date: 07/20/2016 CLINICAL DATA:  Sepsis.  Tooth abscess. EXAM: ORTHOPANTOGRAM/PANORAMIC COMPARISON:  CT 02/18/2011. FINDINGS: No acute bony abnormality identified. No evidence of fracture or dislocation. Periapical lucencies noted throughout the remaining teeth along the mandible suggesting dental disease. No prominent lytic lesion noted. No fracture . If further evaluation is needed maxillofacial CT can be obtained. IMPRESSION: Muscle mild periapical lucencies are noted throughout the remaining teeth of the lower mandible. No prominent lytic lesion noted. Electronically Signed   By: Maisie Fus  Register   On: 07/20/2016 11:19   Dg Chest 1 View  Result Date: 07/20/2016 CLINICAL DATA:  Status post right thoracentesis. EXAM: CHEST 1 VIEW COMPARISON:  07/16/2016 FINDINGS: Right arm PICC line tip is in the projection of the cavoatrial junction. Normal heart size. Bilateral pleural effusions are identified, left greater than right. There is mild interstitial edema. Persistent bilateral mid lung and lower lobe opacities. IMPRESSION: 1. No change in bilateral pleural effusions and bilateral lung opacities. Electronically Signed   By: Signa Kell M.D.   On: 07/20/2016 11:08   Dg Chest 2 View  Result Date: 07/21/2016 CLINICAL DATA:  Sepsis. EXAM: CHEST  2 VIEW COMPARISON:  07/20/2016. FINDINGS: Right PICC line stable position. Heart size stable. Multifocal bilateral pulmonary infiltrates and bilateral pleural effusions again noted. Pulmonary infiltrates have progressed slightly.  IMPRESSION: 1.  Right PICC line stable position. 2. Progressive multifocal bilateral pulmonary infiltrates. Persistent bilateral pleural effusions. Electronically Signed   By: Maisie Fus  Register   On: 07/21/2016 12:03   Dg Chest 2 View  Result Date: 07/13/2016 CLINICAL DATA:  Pneumonia EXAM: CHEST  2 VIEW COMPARISON:  07/08/2016 FINDINGS: Bilateral pleural effusions, left greater than right with lower lobe airspace opacities, also worse on the left. This is improved somewhat on the right, not significantly changed on the left. Right PICC line is in place with the tip at the cavoatrial junction. Low lung volumes. Heart is normal size. IMPRESSION: Bilateral lower lobe airspace opacities, left greater than right. Some improvement on the right since prior study with no change on the left. Small bilateral effusions. Electronically Signed   By: Charlett Nose M.D.   On: 07/13/2016 13:19   Dg Chest 2 View  Result Date: 07/07/2016 CLINICAL DATA:  Nausea vomiting and diarrhea for 8 days EXAM: CHEST  2 VIEW COMPARISON:  None. FINDINGS: Numerous ill-defined nodules throughout both lungs, many cavitary. No confluent consolidation. No large effusion. Hilar, mediastinal and cardiac contours are unremarkable. IMPRESSION: Innumerable nodules, many cavitary. Septic emboli are a leading consideration. Noninfectious inflammatory process or neoplastic process are less likely possibilities. Electronically Signed   By: Ellery Plunk M.D.   On: 07/07/2016 01:55   Dg Shoulder Right  Result Date: 07/18/2016 CLINICAL DATA:  Right shoulder pain with fever and sepsis. EXAM: RIGHT SHOULDER - 2+ VIEW COMPARISON:  Chest x-ray 07/16/2016 FINDINGS: Right-sided PICC line unchanged. Bones, joint spaces and soft tissues over the right shoulder are within normal. Stable changes within the right lung. IMPRESSION: Unremarkable right shoulder. Electronically Signed   By: Elberta Fortis M.D.   On: 07/18/2016 10:49   Ct Chest W  Contrast  Result Date: 07/19/2016 CLINICAL DATA:  Pneumonia, cough, fever, septic embolism, history hepatitis-C, asthma, IV drug abuse, followup EXAM: CT CHEST WITH CONTRAST TECHNIQUE: Multidetector CT imaging of the chest was performed during intravenous contrast administration. Sagittal and coronal MPR images reconstructed from axial data set. CONTRAST:  75 mL ISOVUE-300 IOPAMIDOL (ISOVUE-300) INJECTION 61% IV COMPARISON:  07/07/2016 FINDINGS: Cardiovascular: Vascular structures grossly patent on nondedicated exam. RIGHT arm PICC line with tip in SVC near cavoatrial junction. No pericardial effusion. Mediastinum/Nodes: Few scattered normal size mediastinal lymph nodes. No definite thoracic adenopathy. Esophagus unremarkable. Base of cervical region normal appearance. Lungs/Pleura: Multiple cavitary nodules are seen within both lungs consistent with septic emboli. Number of nodules has decreased since the previous exam, with some of the smaller nodule seen previously no longer identified. Remaining nodules are stable to decreased in sizes ; RIGHT upper lobe nodule measured previously at 21 mm now measures 16 mm image 32 and previously measured LEFT upper lobe nodule of 23 mm measures 16 mm at present. BILATERAL pleural effusions with compressive atelectasis of the lower lobes. No pneumothorax. Upper Abdomen: Beam hardening artifacts from patient's RIGHT arm. Spleen appears enlarged. No additional upper abdominal abnormalities seen. Musculoskeletal: No acute osseous findings. IMPRESSION: New BILATERAL pleural effusions with compressive atelectasis of the lower lobes. Cavitary lung nodules bilaterally compatible with septic emboli, decreased in number with remaining nodules stable to decreased in sizes since the previous exam. Electronically Signed   By: Ulyses Southward M.D.   On: 07/19/2016 13:01   Ct Angio Chest Pe W And/or Wo Contrast  Result Date: 07/07/2016 CLINICAL DATA:  Short of breath fever, tachycardia. IV  drug abuse. Leukocytosis. EXAM: CT ANGIOGRAPHY CHEST CT ABDOMEN AND PELVIS WITH CONTRAST TECHNIQUE: Multidetector CT imaging of the chest was performed using the standard protocol during bolus administration of intravenous contrast. Multiplanar CT image reconstructions and MIPs were obtained to evaluate the vascular anatomy. Multidetector CT imaging of the abdomen and pelvis was performed using the standard protocol during bolus administration of intravenous contrast. CONTRAST:  100 mL Isovue COMPARISON:  Chest radiograph 07/07/2016 FINDINGS: CTA CHEST FINDINGS Cardiovascular: No filling defects within the pulmonary to suggest acute pulmonary embolism. No acute findings of the aorta great vessels. No pericardial effusion. Heart is grossly normal. Mediastinum/Nodes: No axillary or supraclavicular adenopathy. No mediastinal hilar adenopathy. Ill-defined peribronchial thickening in the hila. Lungs/Pleura: Bilateral thick-walled cavitary nodules in the upper and lower lobes. Consolidated airspace disease in the lower lobes. Approximately 30 nodules per lung. Example nodule cavitary nodule in the RIGHT upper lobe measures 21 mm (image 27, series 6). Example cavitary nodule in the  LEFT upper lobe measures 23 mm on image 30 series 6. Musculoskeletal: No aggressive osseous lesion. Review of the MIP images confirms the above findings. CT ABDOMEN and PELVIS FINDINGS Hepatobiliary: Liver is enlarged. No focal hepatic lesion. Periportal edema noted. Portal veins are patent. Gallbladder wall is edematous and thickened to 6 mm. The gallbladder lumen is contracted. Common bile duct normal caliber. Pancreas: No pancreatic inflammation or duct dilatation Spleen: Spleen is enlarged measuring 14.7 by 7.5 x 12.4 cm (volume = 720 cm^3). Low-density lesion in the spleen measuring 12 mm does not have simple fluid attenuation. Adrenals/Urinary Tract: Adrenal glands are normal. There is heterogeneous enhancement of the kidneys on the  cortical phase imaging (image 32, series 2 for example). On the delayed pyelogram phase imaging the kidneys have a striated appearance (image 14 series 17). No renal obstruction. Bladder normal Stomach/Bowel: Stomach, small bowel, appendix, and cecum are normal. The colon and rectosigmoid colon are normal. Vascular/Lymphatic: Abdominal aorta is normal caliber. No abdominal lymphadenopathy. Reproductive: Uterus and ovaries grossly normal Other: Smaller free fluid in the posterior pelvis Musculoskeletal: No aggressive osseous lesion. Review of the MIP images confirms the above findings. IMPRESSION: Chest Impression: 1. Bilateral thick-walled cavitary nodules consistent with SEPTIC PULMONARY EMBOLI in this population (young patient with IV drug use). 2. Bibasilar consolidation consistent with pneumonia Abdomen / Pelvis Impression: 1. 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 2. Hepatosplenomegaly. Low-density lesion in the spleen is indeterminate. 3. Small volume of intraperitoneal free fluid and periportal edema may relate to hepatic dysfunction. Electronically Signed   By: Genevive Bi M.D.   On: 07/07/2016 09:29   Ct Abdomen Pelvis W Contrast  Result Date: 07/07/2016 CLINICAL DATA:  Short of breath fever, tachycardia. IV drug abuse. Leukocytosis. EXAM: CT ANGIOGRAPHY CHEST CT ABDOMEN AND PELVIS WITH CONTRAST TECHNIQUE: Multidetector CT imaging of the chest was performed using the standard protocol during bolus administration of intravenous contrast. Multiplanar CT image reconstructions and MIPs were obtained to evaluate the vascular anatomy. Multidetector CT imaging of the abdomen and pelvis was performed using the standard protocol during bolus administration of intravenous contrast. CONTRAST:  100 mL Isovue COMPARISON:  Chest radiograph 07/07/2016 FINDINGS: CTA CHEST FINDINGS Cardiovascular: No filling defects within the pulmonary to suggest acute pulmonary embolism. No  acute findings of the aorta great vessels. No pericardial effusion. Heart is grossly normal. Mediastinum/Nodes: No axillary or supraclavicular adenopathy. No mediastinal hilar adenopathy. Ill-defined peribronchial thickening in the hila. Lungs/Pleura: Bilateral thick-walled cavitary nodules in the upper and lower lobes. Consolidated airspace disease in the lower lobes. Approximately 30 nodules per lung. Example nodule cavitary nodule in the RIGHT upper lobe measures 21 mm (image 27, series 6). Example cavitary nodule in the LEFT upper lobe measures 23 mm on image 30 series 6. Musculoskeletal: No aggressive osseous lesion. Review of the MIP images confirms the above findings. CT ABDOMEN and PELVIS FINDINGS Hepatobiliary: Liver is enlarged. No focal hepatic lesion. Periportal edema noted. Portal veins are patent. Gallbladder wall is edematous and thickened to 6 mm. The gallbladder lumen is contracted. Common bile duct normal caliber. Pancreas: No pancreatic inflammation or duct dilatation Spleen: Spleen is enlarged measuring 14.7 by 7.5 x 12.4 cm (volume = 720 cm^3). Low-density lesion in the spleen measuring 12 mm does not have simple fluid attenuation. Adrenals/Urinary Tract: Adrenal glands are normal. There is heterogeneous enhancement of the kidneys on the cortical phase imaging (image 32, series 2 for example). On the delayed pyelogram phase imaging the kidneys have a  striated appearance (image 14 series 17). No renal obstruction. Bladder normal Stomach/Bowel: Stomach, small bowel, appendix, and cecum are normal. The colon and rectosigmoid colon are normal. Vascular/Lymphatic: Abdominal aorta is normal caliber. No abdominal lymphadenopathy. Reproductive: Uterus and ovaries grossly normal Other: Smaller free fluid in the posterior pelvis Musculoskeletal: No aggressive osseous lesion. Review of the MIP images confirms the above findings. IMPRESSION: Chest Impression: 1. Bilateral thick-walled cavitary nodules  consistent with SEPTIC PULMONARY EMBOLI in this population (young patient with IV drug use). 2. Bibasilar consolidation consistent with pneumonia Abdomen / Pelvis Impression: 1. 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 2. Hepatosplenomegaly. Low-density lesion in the spleen is indeterminate. 3. Small volume of intraperitoneal free fluid and periportal edema may relate to hepatic dysfunction. Electronically Signed   By: Genevive Bi M.D.   On: 07/07/2016 09:29   Dg Chest Port 1 View  Result Date: 07/16/2016 CLINICAL DATA:  MRSA bacteremia with septic pulmonary emboli. EXAM: PORTABLE CHEST 1 VIEW COMPARISON:  07/13/2016. FINDINGS: Right PICC tip at the superior cavoatrial junction. Normal sized heart. Mildly increased bilateral airspace opacity and bilateral pleural fluid. Unremarkable bones. IMPRESSION: Mildly increased bilateral pneumonia and pleural fluid. Electronically Signed   By: Beckie Salts M.D.   On: 07/16/2016 09:53   Dg Chest Port 1 View  Result Date: 07/08/2016 CLINICAL DATA:  Patient admitted yesterday with MR SA bacteremia. Possible aspiration today. EXAM: PORTABLE CHEST 1 VIEW COMPARISON:  CT chest and PA and lateral chest 07/07/2014. FINDINGS: Multiple nodular opacities bilaterally again seen. There is increased bibasilar airspace disease. No pneumothorax. Small pleural effusions noted. Heart size is normal. IMPRESSION: Increased bibasilar airspace disease could be due to atelectasis, aspiration and/or pneumonia. Multifocal nodular opacities compatible with septic emboli as seen on chest CT yesterday. Electronically Signed   By: Drusilla Kanner M.D.   On: 07/08/2016 12:07   US Thoracentesis Asp Pleural Space W/img Guide  Result Date: 07/20/2016 INDICATION: Pneumonia, cough, fever, septic embolism, history of hepatitis-C, asthma, IV drug abuse, bilateral pleural effusions seen on CT scan. Request for diagnostic and therapeutic thoracentesis. EXAM:  ULTRASOUND GUIDED RIGHT THORACENTESIS MEDICATIONS: 1% Lidocaine = 10 mL. COMPLICATIONS: None immediate. PROCEDURE: An ultrasound guided thoracentesis was thoroughly discussed with the patient and questions answered. The benefits, risks, alternatives and complications were also discussed. The patient understands and wishes to proceed with the procedure. Written consent was obtained. Ultrasound was performed to localize and mark an adequate pocket of fluid in the right chest. The effusion appeared loculated. The area was then prepped and draped in the normal sterile fashion. 1% Lidocaine was used for local anesthesia. Under ultrasound guidance a 6 Fr Safe-T-Centesis catheter was introduced. Thoracentesis was performed. The catheter was removed and a dressing applied. FINDINGS: A total of approximately 160 mL of blood tinged fluid was removed. Samples were sent to the laboratory as requested by the clinical team. IMPRESSION: Successful ultrasound guided right thoracentesis yielding only 160 mL of pleural fluid due to loculations. Read by:  Corrin Parker, PA-C Electronically Signed   By: Richarda Overlie M.D.   On: 07/20/2016 10:38    Time Spent in minutes 35   Penny Pia M.D on 07/24/2016 at 2:04 PM  Between 7am to 7pm - Pager - [phone]  After 7pm go to [email or web address] - password [facility]  [facility] -  Office  [phone]

## 2016-07-24 NOTE — Progress Notes (Addendum)
    [facility]   Reason for visit: Follow up on MRSA bacteremia, TV endocarditis  Interval History: no growth on effusion culture; afebrile  Physical Exam: Constitutional:  Vitals:   07/24/16 0600 07/24/16 0929  BP: 99/64 121/83  Pulse: 87 98  Resp: 16 18  Temp: 98.8 F (37.1 C) 98.5 F (36.9 C)   patient appears in NAD Respiratory: Normal respiratory effort; CTA B Cardiovascular: RRR GI: soft, nt  Review of Systems: Constitutional: positive for fevers or negative for chills Gastrointestinal: negative for diarrhea Integument/breast: negative for rash  Lab Results  Component Value Date   WBC 11.9 (H) 07/23/2016   HGB 9.0 (L) 07/23/2016   HCT 29.0 (L) 07/23/2016   MCV 85.5 07/23/2016   PLT 318 07/23/2016    Lab Results  Component Value Date   CREATININE 0.60 07/23/2016   BUN 11 07/23/2016   NA 133 (L) 07/23/2016   K 4.6 07/23/2016   CL 97 (L) 07/23/2016   CO2 28 07/23/2016    Lab Results  Component Value Date   ALT 19 07/08/2016   AST 31 07/08/2016   ALKPHOS 93 07/08/2016     Microbiology: Recent Results (from the past 240 hour(s))  Culture, body fluid-bottle     Status: None (Preliminary result)   Collection Time: 07/20/16 11:27 AM  Result Value Ref Range Status   Specimen Description PERITONEAL  Final   Special Requests NONE  Final   Culture NO GROWTH 3 DAYS  Final   Report Status PENDING  Incomplete  Gram stain     Status: None   Collection Time: 07/20/16 11:27 AM  Result Value Ref Range Status   Specimen Description PERITONEAL  Final   Special Requests NONE  Final   Gram Stain   Final    FEW WBC PRESENT,BOTH PMN AND MONONUCLEAR NO ORGANISMS SEEN    Report Status 07/20/2016 FINAL  Final    Impression/Plan:  1. MRSA bacteremia - on vancomycin.  Repeat blood cultures negative.  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  She will need to be placed, cannot go home with a picc  line. Antibiotics per SNF protocol OPAT consult at discharge Can pull picc line at the end of 6 weeks on February 19th  3.  Substance abuse - will need continued efforts at this.   We will arrange follow up in our office in about 4 weeks  I will follow up intermittently

## 2016-07-24 NOTE — Consult Note (Signed)
DENTAL CONSULTATION  Date of Consultation:  07/24/2016 Patient Name:   Anna Colon Date of Birth:   1980/11/04 Medical Record Number: [id number]  VITALS: BP 121/83 (BP Location: Left Arm)   Pulse 98   Temp 98.5 F (36.9 C) (Oral)   Resp 18   Ht 5\' 2"  (1.575 m)   Wt 120 lb 4.8 oz (54.6 kg)   LMP 05/16/2016   SpO2 99%   BMI 22.00 kg/m   CHIEF COMPLAINT: The patient was referred by Dr. Kathlee Nations Trigt for a dental consultation.  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  A dental consultation was requested to evaluate poor dentition prior to possible tricuspid valve surgery. Patient is now seen to rule out dental infection that may affect the patient's systemic health or future heart valve surgery.  The patient currently denies acute toothaches, swellings, or abscesses. Patient has not seen a dentist for a long time. Patient thinks her last dental visit 2-3 years ago was for fabrication of an upper complete denture in [place], [place]. Patient indicates that the upper complete denture"fits good."  Patient denies having a  lower partial denture. Patient denies having dental phobia.  PROBLEM LIST: Patient Active Problem List   Diagnosis Date Noted  . 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 Acute pain of right shoulder   . Septic embolism (HCC)   . Pressure injury of skin 07/14/2016  . Acute septic pulmonary embolism without acute cor pulmonale (HCC)   . Sepsis due to methicillin resistant Staphylococcus aureus (MRSA) (HCC)   . Hepatitis C antibody test positive 07/08/2016  . Normocytic anemia 07/08/2016  . Thrombocytopenia (HCC) 07/08/2016  . Sepsis (HCC) 07/07/2016  . MRSA bacteremia 07/07/2016  . Cavitary pneumonia 07/07/2016  . IVDU (intravenous drug user) 07/07/2016  . Cigarette smoker 07/07/2016  . ETOH abuse 07/07/2016    PMH: Past Medical History:  Diagnosis Date   . Asthma   . ETOH abuse   . Hepatitis C   . IVDU (intravenous drug user)     PSH: Past Surgical History:  Procedure Laterality Date  . CESAREAN SECTION      ALLERGIES: No Known Allergies  MEDICATIONS: Current Facility-Administered Medications  Medication Dose Route Frequency Provider Last Rate Last Dose  . acetaminophen (TYLENOL) tablet 650 mg  650 mg Oral Q6H PRN Haydee Salter, MD   650 mg at 07/23/16 1758   Or  . acetaminophen (TYLENOL) suppository 650 mg  650 mg Rectal Q6H PRN Haydee Salter, MD      . albuterol (PROVENTIL) (2.5 MG/3ML) 0.083% nebulizer solution 2.5 mg  2.5 mg Nebulization Q6H PRN Nishant Dhungel, MD   2.5 mg at 07/19/16 1812  . cloNIDine (CATAPRES) tablet 0.1 mg  0.1 mg Oral BID Nishant Dhungel, MD   0.1 mg at 07/24/16 1030  . enoxaparin (LOVENOX) injection 40 mg  40 mg Subcutaneous Q24H Haydee Salter, MD   40 mg at 07/23/16 2148  . feeding supplement (ENSURE ENLIVE) (ENSURE ENLIVE) liquid 237 mL  237 mL Oral BID BM Nishant Dhungel, MD   237 mL at 07/24/16 1018  . ferrous sulfate tablet 325 mg  325 mg Oral BID WC Nishant Dhungel, MD   325 mg at 07/24/16 1016  . folic acid (FOLVITE) tablet 1 mg  1 mg Oral Daily Haydee Salter, MD   1 mg at 07/24/16 1016  . hydrALAZINE (APRESOLINE) injection  10 mg  10 mg Intravenous Q8H PRN Haydee SalterPhillip M Hobbs, MD      . HYDROmorphone (DILAUDID) injection 1.5 mg  1.5 mg Intravenous Q6H PRN Kerin PernaPeter Van Trigt, MD   1.5 mg at 07/24/16 1017  . lidocaine (LIDODERM) 5 % 1 patch  1 patch Transdermal Q24H Nishant Dhungel, MD   1 patch at 07/22/16 1026  . meloxicam (MOBIC) tablet 15 mg  15 mg Oral Daily Penny Piarlando Vega, MD   15 mg at 07/24/16 1016  . multivitamin with minerals tablet 1 tablet  1 tablet Oral Daily Haydee SalterPhillip M Hobbs, MD   1 tablet at 07/24/16 1017  . nicotine (NICODERM CQ - dosed in mg/24 hours) patch 21 mg  21 mg Transdermal Daily Nishant Dhungel, MD   21 mg at 07/15/16 0916  . ondansetron (ZOFRAN) tablet 4 mg  4 mg Oral Q6H  PRN Haydee SalterPhillip M Hobbs, MD       Or  . ondansetron [facility](ZOFRAN) injection 4 mg  4 mg Intravenous Q6H PRN Haydee SalterPhillip M Hobbs, MD      . oxyCODONE (Oxy IR/ROXICODONE) immediate release tablet 5 mg  5 mg Oral Q4H PRN Haydee SalterPhillip M Hobbs, MD   5 mg at 07/24/16 0321  . polyethylene glycol (MIRALAX / GLYCOLAX) packet 17 g  17 g Oral Daily PRN Haydee SalterPhillip M Hobbs, MD      . sodium chloride flush (NS) 0.9 % injection 10-40 mL  10-40 mL Intracatheter Q12H Penny Piarlando Vega, MD   40 mL at 07/22/16 2101  . sodium chloride flush (NS) 0.9 % injection 10-40 mL  10-40 mL Intracatheter PRN Penny Piarlando Vega, MD   10 mL at 07/19/16 2120  . thiamine (VITAMIN B-1) tablet 100 mg  100 mg Oral Daily Haydee SalterPhillip M Hobbs, MD   100 mg at 07/24/16 1016   Or  . thiamine (B-1) injection 100 mg  100 mg Intravenous Daily Haydee SalterPhillip M Hobbs, MD   100 mg at 07/08/16 0954  . triamcinolone ointment (KENALOG) 0.5 %   Topical BID Kerin PernaPeter Van Trigt, MD      . vancomycin [facility](VANCOCIN) IVPB 750 mg/150 ml premix  750 mg Intravenous Q8H Ann HeldElizabeth J Martin, RPH   750 mg at 07/24/16 1016  . zolpidem (AMBIEN) tablet 5 mg  5 mg Oral QHS PRN Alexis Hugelmeyer, DO   5 mg at 07/23/16 2147    LABS: Lab Results  Component Value Date   WBC 11.9 (H) 07/23/2016   HGB 9.0 (L) 07/23/2016   HCT 29.0 (L) 07/23/2016   MCV 85.5 07/23/2016   PLT 318 07/23/2016      Component Value Date/Time   NA 133 (L) 07/23/2016 0409   K 4.6 07/23/2016 0409   CL 97 (L) 07/23/2016 0409   CO2 28 07/23/2016 0409   GLUCOSE 104 (H) 07/23/2016 0409   BUN 11 07/23/2016 0409   CREATININE 0.60 07/23/2016 0409   CALCIUM 8.8 (L) 07/23/2016 0409   GFRNONAA >60 07/23/2016 0409   GFRAA >60 07/23/2016 0409   Lab Results  Component Value Date   INR 1.51 07/08/2016   No results found for: PTT  SOCIAL HISTORY: Social History   Social History  . Marital status: Divorced    Spouse name: N/A  . Number of children: N/A  . Years of education: N/A   Occupational History  . Not on file.   Social  History Main Topics  . Smoking status: Current Every Day Smoker    Packs/day: 1.00  . Smokeless tobacco: Never Used  .  Alcohol use No  . Drug use: Yes    Types: IV, Cocaine  . Sexual activity: No   Other Topics Concern  . Not on file   Social History Narrative  . No narrative on file    FAMILY HISTORY: History reviewed. No pertinent family history.  REVIEW OF SYSTEMS: Reviewed With the patient as per history of present illness.  Psych: Patient denies having dental phobia.   DENTAL HISTORY: CHIEF COMPLAINT: The patient was referred by Dr. Kathlee Nations Trigt for a dental consultation.  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  A dental consultation was requested to evaluate poor dentition prior to possible tricuspid valve surgery. Patient is now seen to rule out dental infection that may affect the patient's systemic health or future heart valve surgery.  The patient currently denies acute toothaches, swellings, or abscesses. Patient has not seen a dentist for a long time. Patient thinks her last dental visit was 2-3 years ago for fabrication of an upper complete denture in [place], [place]. Patient indicates that the upper complete denture"fits good."  Patient denies having a  lower partial denture. Patient denies having dental phobia.  DENTAL EXAMINATION: GENERAL:The patient is a well-developed, slightly built female in no acute distress.  HEAD AND NECK:There is no palpable submandibular lymphadenopathy. The patient denies acute TMJ symptoms.  INTRAORAL EXAM:Patient has normal saliva. There is no evidence of oral abscess formation.  DENTITION: Patient is missing all maxillary teeth. Patient is also missing tooth numbers 17, 20, 31, and 32. There are retained root segments in the area of tooth numbers 18, 19, 21-30. PERIODONTAL:Patient has chronic periodontitis with plaque and calculus accumulations,  gingival recession, and incipient tooth mobility.  DENTAL CARIES/SUBOPTIMAL RESTORATIONS:There are dental caries affecting all remaining teeth.  ENDODONTIC:Patient currently denies acute pulpitis symptoms. Patient does have multiple areas of periapical pathology and radiolucency.  CROWN AND BRIDGE:There are no crown or bridge restorations.  PROSTHODONTIC:Patient has an upper complete denture that " fits good" by patient report.is no lower partial denture. The upper maxillary denture was fabricated in [place], [place] about 2-3 years ago.  OCCLUSION: Patient has a poor occlusal scheme secondary to multiple missing teeth, multiple retained root segments, and lack of replacement of missing teeth with dental prostheses.  RADIOGRAPHIC INTERPRETATION: And orthopantogram was taken on 07/20/2016. There are multiple missing teeth. There are multiple retained root segments. There multiple areas of periapical pathology and radiolucency. Dental caries are noted. There is incipient to moderate bone loss noted. There is atrophy of the edentulous alveolar ridges.  ASSESSMENTS: 1. Endocarditis of tricuspid valve 2. History of MRSA bacteremia 3. Chronic apical periodontitis 4. Multiple retained root segments 5. Multiple dental caries 6. Chronic periodontitis with bone loss 7. Accretions 8. Gingival recession 9. Tooth mobility 10. Multiple missing teeth 11. Multiple retained root segments 12. Maxillary complete denture and no lower partial denture 13. Malocclusion 14. History of thrombocytopenia with risk for bleeding with invasive dental procedures  15.  Risk for complications up to and including death with anticipated invasive dental procedures in the operating room with general anesthesia due to overall cardiovascular and respiratory compromise.   PLAN/RECOMMENDATIONS: 1. I discussed the risks, benefits, and complications of various treatment options with the patient in relationship to her  medical and dental conditions, endocarditis, and tricuspid valve disease, and possible future tricuspid valve replacement. We discussed various treatment options to include no treatment, multiple extractions with alveoloplasty, pre-prosthetic  surgery as indicated, periodontal therapy, dental restorations, root canal therapy, crown and bridge therapy, implant therapy, and replacement of missing teeth as indicated. The patient currently wishes to proceed with extraction of remaining teeth with alveoloplasty in the operating room with general anesthesia. This has been scheduled for Tuesday, 07/25/2016 at 8:45 AM in the operating room at [facility]. The patient will then follow-up with a dentist of her choice for fabrication of a lower complete denture or new upper and lower complete dentures after adequate healing.  2. Discussion of findings with medical team and coordination of future medical and dental care as needed.    Charlynne Pander, DDS

## 2016-07-24 NOTE — Clinical Social Work Note (Signed)
CSW talked with patient and her father, Guy FrancoJeffrey Biello regarding [place] planning. [place]W will f/u with financial counselor regarding this. CSW talked with patient and dad regarding the search for a facility that will accept patient, and the barrier of her age, mobility, and current substance abuse history and patient expressed understanding. Ms. Road also informed that once medically stable and discharged to a skilled facility, she could have no visitors other than her father and patient again expressed understanding and agreement. CSW will continue to follow and work Retail bankerCSW leadership on finding skilled facility placement for patient.  Genelle BalVanessa Zeynep Fantroy, MSW, LCSW Licensed Clinical Social Worker Clinical Social Work Department [facility] [phone]

## 2016-07-25 ENCOUNTER — Encounter (HOSPITAL_COMMUNITY)
Admission: EM | Disposition: A | Discharge status: Skilled Nursing Facility | Payer: Self-pay | Source: Home / Self Care | Attending: Family Medicine

## 2016-07-25 ENCOUNTER — Encounter (HOSPITAL_COMMUNITY): Payer: Self-pay | Admitting: Critical Care Medicine

## 2016-07-25 ENCOUNTER — Inpatient Hospital Stay (HOSPITAL_COMMUNITY): Payer: Self-pay | Admitting: Critical Care Medicine

## 2016-07-25 DIAGNOSIS — K0530 Chronic periodontitis, unspecified: Secondary | ICD-10-CM

## 2016-07-25 DIAGNOSIS — K029 Dental caries, unspecified: Secondary | ICD-10-CM | POA: Diagnosis present

## 2016-07-25 DIAGNOSIS — K045 Chronic apical periodontitis: Secondary | ICD-10-CM

## 2016-07-25 DIAGNOSIS — K083 Retained dental root: Secondary | ICD-10-CM | POA: Diagnosis present

## 2016-07-25 HISTORY — PX: MULTIPLE EXTRACTIONS WITH ALVEOLOPLASTY: SHX5342

## 2016-07-25 LAB — CULTURE, BODY FLUID W GRAM STAIN -BOTTLE

## 2016-07-25 LAB — BASIC METABOLIC PANEL
Anion gap: 9 (ref 5–15)
BUN: 9 mg/dL (ref 6–20)
CHLORIDE: 98 mmol/L — AB (ref 101–111)
CO2: 27 mmol/L (ref 22–32)
Calcium: 8.9 mg/dL (ref 8.9–10.3)
Creatinine, Ser: 0.67 mg/dL (ref 0.44–1.00)
GFR calc non Af Amer: >60 mL/min (ref >60)
Glucose, Bld: 89 mg/dL (ref 65–99)
Potassium: 4.6 mmol/L (ref 3.5–5.1)
SODIUM: 134 mmol/L — AB (ref 135–145)

## 2016-07-25 LAB — CULTURE, BODY FLUID-BOTTLE: CULTURE: NO GROWTH

## 2016-07-25 LAB — VANCOMYCIN, TROUGH: Vancomycin Tr: 8 ug/mL — ABNORMAL LOW (ref 15–20)

## 2016-07-25 SURGERY — MULTIPLE EXTRACTION WITH ALVEOLOPLASTY
Anesthesia: General

## 2016-07-25 MED ORDER — DEXAMETHASONE SODIUM PHOSPHATE 10 MG/ML IJ SOLN
INTRAMUSCULAR | Status: DC | PRN
  Administered 2016-07-25: 10 mg via INTRAVENOUS

## 2016-07-25 MED ORDER — MIDAZOLAM HCL 2 MG/2ML IJ SOLN: 0.5000 mg | Freq: Once | INTRAMUSCULAR | Status: DC | PRN

## 2016-07-25 MED ORDER — SUCCINYLCHOLINE CHLORIDE 200 MG/10ML IV SOSY
PREFILLED_SYRINGE | INTRAVENOUS | Status: DC | PRN
  Administered 2016-07-25: 80 mg via INTRAVENOUS

## 2016-07-25 MED ORDER — FENTANYL CITRATE (PF) 250 MCG/5ML IJ SOLN
INTRAMUSCULAR | Status: AC
  Filled 2016-07-25: qty 5

## 2016-07-25 MED ORDER — ONDANSETRON HCL 4 MG/2ML IJ SOLN
INTRAMUSCULAR | Status: AC
  Filled 2016-07-25: qty 2

## 2016-07-25 MED ORDER — LACTATED RINGERS IV SOLN
INTRAVENOUS | Status: DC | PRN
  Administered 2016-07-25: 09:00:00 via INTRAVENOUS

## 2016-07-25 MED ORDER — PRO-STAT SUGAR FREE PO LIQD
30.0000 mL | Freq: Two times a day (BID) | ORAL | Status: DC
  Administered 2016-07-25 – 2016-08-05 (×8): 30 mL via ORAL
  Filled 2016-07-25 (×16): qty 30

## 2016-07-25 MED ORDER — BUPIVACAINE-EPINEPHRINE 0.5% -1:200000 IJ SOLN
INTRAMUSCULAR | Status: DC | PRN
  Administered 2016-07-25: 3.6 mL

## 2016-07-25 MED ORDER — PROPOFOL 10 MG/ML IV BOLUS
INTRAVENOUS | Status: AC
  Filled 2016-07-25: qty 20

## 2016-07-25 MED ORDER — MIDAZOLAM HCL 5 MG/5ML IJ SOLN
INTRAMUSCULAR | Status: DC | PRN
  Administered 2016-07-25 (×2): 2 mg via INTRAVENOUS

## 2016-07-25 MED ORDER — PHENYLEPHRINE HCL 10 MG/ML IJ SOLN
INTRAMUSCULAR | Status: DC | PRN
  Administered 2016-07-25: 20 ug/min via INTRAVENOUS

## 2016-07-25 MED ORDER — ENOXAPARIN SODIUM 40 MG/0.4ML ~~LOC~~ SOLN
40.0000 mg | SUBCUTANEOUS | Status: DC
  Administered 2016-07-25 – 2016-08-07 (×13): 40 mg via SUBCUTANEOUS
  Filled 2016-07-25 (×14): qty 0.4

## 2016-07-25 MED ORDER — BUPIVACAINE-EPINEPHRINE (PF) 0.5% -1:200000 IJ SOLN
INTRAMUSCULAR | Status: AC
  Filled 2016-07-25: qty 3.6

## 2016-07-25 MED ORDER — MIDAZOLAM HCL 2 MG/2ML IJ SOLN
INTRAMUSCULAR | Status: AC
  Filled 2016-07-25: qty 2

## 2016-07-25 MED ORDER — LACTATED RINGERS IV SOLN: INTRAVENOUS | Status: DC

## 2016-07-25 MED ORDER — 0.9 % SODIUM CHLORIDE (POUR BTL) OPTIME
TOPICAL | Status: DC | PRN
  Administered 2016-07-25: 1000 mL

## 2016-07-25 MED ORDER — LIDOCAINE-EPINEPHRINE 2 %-1:100000 IJ SOLN
INTRAMUSCULAR | Status: AC
  Filled 2016-07-25: qty 10.2

## 2016-07-25 MED ORDER — HYDROMORPHONE HCL 1 MG/ML IJ SOLN
INTRAMUSCULAR | Status: AC
  Administered 2016-07-25: 0.5 mg via INTRAVENOUS
  Filled 2016-07-25: qty 0.5

## 2016-07-25 MED ORDER — VANCOMYCIN HCL IN DEXTROSE 750-5 MG/150ML-% IV SOLN
750.0000 mg | Freq: Three times a day (TID) | INTRAVENOUS | Status: DC
  Administered 2016-07-25 – 2016-07-27 (×6): 750 mg via INTRAVENOUS
  Filled 2016-07-25 (×6): qty 150

## 2016-07-25 MED ORDER — ONDANSETRON HCL 4 MG/2ML IJ SOLN
INTRAMUSCULAR | Status: DC | PRN
  Administered 2016-07-25: 4 mg via INTRAVENOUS

## 2016-07-25 MED ORDER — HYDROMORPHONE HCL 1 MG/ML IJ SOLN
0.5000 mg | Freq: Once | INTRAMUSCULAR | Status: AC | PRN
  Administered 2016-07-25: 0.5 mg via INTRAVENOUS

## 2016-07-25 MED ORDER — LIDOCAINE-EPINEPHRINE 2 %-1:100000 IJ SOLN
INTRAMUSCULAR | Status: DC | PRN
  Administered 2016-07-25: 3.4 mL via INTRADERMAL

## 2016-07-25 MED ORDER — OXYMETAZOLINE HCL 0.05 % NA SOLN
NASAL | Status: AC
  Filled 2016-07-25: qty 15

## 2016-07-25 MED ORDER — FENTANYL CITRATE (PF) 100 MCG/2ML IJ SOLN
INTRAMUSCULAR | Status: DC | PRN
  Administered 2016-07-25 (×2): 50 ug via INTRAVENOUS
  Administered 2016-07-25 (×2): 25 ug via INTRAVENOUS
  Administered 2016-07-25: 50 ug via INTRAVENOUS

## 2016-07-25 MED ORDER — ENSURE ENLIVE PO LIQD
237.0000 mL | Freq: Three times a day (TID) | ORAL | Status: DC
  Administered 2016-07-25 – 2016-08-07 (×26): 237 mL via ORAL

## 2016-07-25 MED ORDER — PROPOFOL 10 MG/ML IV BOLUS
INTRAVENOUS | Status: DC | PRN
  Administered 2016-07-25: 150 mg via INTRAVENOUS
  Administered 2016-07-25: 50 mg via INTRAVENOUS

## 2016-07-25 MED ORDER — HYDROMORPHONE HCL 1 MG/ML IJ SOLN
0.2500 mg | INTRAMUSCULAR | Status: DC | PRN
  Administered 2016-07-25: 0.5 mg via INTRAVENOUS

## 2016-07-25 MED ORDER — VANCOMYCIN HCL IN DEXTROSE 750-5 MG/150ML-% IV SOLN: 750.0000 mg | Freq: Three times a day (TID) | INTRAVENOUS | Status: DC

## 2016-07-25 MED ORDER — DEXAMETHASONE SODIUM PHOSPHATE 10 MG/ML IJ SOLN
INTRAMUSCULAR | Status: AC
  Filled 2016-07-25: qty 1

## 2016-07-25 MED ORDER — HEMOSTATIC AGENTS (NO CHARGE) OPTIME
TOPICAL | Status: DC | PRN
  Administered 2016-07-25: 1 via TOPICAL

## 2016-07-25 MED ORDER — PHENYLEPHRINE 40 MCG/ML (10ML) SYRINGE FOR IV PUSH (FOR BLOOD PRESSURE SUPPORT)
PREFILLED_SYRINGE | INTRAVENOUS | Status: DC | PRN
  Administered 2016-07-25 (×2): 80 ug via INTRAVENOUS
  Administered 2016-07-25: 120 ug via INTRAVENOUS
  Administered 2016-07-25: 40 ug via INTRAVENOUS

## 2016-07-25 SURGICAL SUPPLY — 37 items
ALCOHOL 70% 16 OZ (MISCELLANEOUS) ×3 IMPLANT
ATTRACTOMAT 16X20 MAGNETIC DRP (DRAPES) ×3 IMPLANT
BLADE SURG 15 STRL LF DISP TIS (BLADE) ×2 IMPLANT
BLADE SURG 15 STRL SS (BLADE) ×4
COVER SURGICAL LIGHT HANDLE (MISCELLANEOUS) ×3 IMPLANT
GAUZE PACKING FOLDED 2  STR (GAUZE/BANDAGES/DRESSINGS) ×2
GAUZE PACKING FOLDED 2 STR (GAUZE/BANDAGES/DRESSINGS) ×1 IMPLANT
GAUZE SPONGE 4X4 16PLY XRAY LF (GAUZE/BANDAGES/DRESSINGS) ×3 IMPLANT
GLOVE BIOGEL PI IND STRL 6 (GLOVE) ×1 IMPLANT
GLOVE BIOGEL PI INDICATOR 6 (GLOVE) ×2
GLOVE SURG ORTHO 8.0 STRL STRW (GLOVE) ×3 IMPLANT
GLOVE SURG SS PI 6.0 STRL IVOR (GLOVE) ×3 IMPLANT
GOWN STRL REUS W/ TWL LRG LVL3 (GOWN DISPOSABLE) ×1 IMPLANT
GOWN STRL REUS W/TWL 2XL LVL3 (GOWN DISPOSABLE) ×3 IMPLANT
GOWN STRL REUS W/TWL LRG LVL3 (GOWN DISPOSABLE) ×2
HEMOSTAT SURGICEL 2X14 (HEMOSTASIS) ×3 IMPLANT
KIT BASIN OR (CUSTOM PROCEDURE TRAY) ×3 IMPLANT
KIT ROOM TURNOVER OR (KITS) ×3 IMPLANT
MANIFOLD NEPTUNE WASTE (CANNULA) ×3 IMPLANT
NEEDLE BLUNT 16X1.5 OR ONLY (NEEDLE) ×3 IMPLANT
NS IRRIG 1000ML POUR BTL (IV SOLUTION) ×3 IMPLANT
PACK EENT II TURBAN DRAPE (CUSTOM PROCEDURE TRAY) ×3 IMPLANT
PAD ARMBOARD 7.5X6 YLW CONV (MISCELLANEOUS) ×3 IMPLANT
SPONGE SURGIFOAM ABS GEL 100 (HEMOSTASIS) IMPLANT
SPONGE SURGIFOAM ABS GEL 12-7 (HEMOSTASIS) IMPLANT
SPONGE SURGIFOAM ABS GEL SZ50 (HEMOSTASIS) IMPLANT
SUCTION FRAZIER HANDLE 10FR (MISCELLANEOUS) ×2
SUCTION TUBE FRAZIER 10FR DISP (MISCELLANEOUS) ×1 IMPLANT
SUT CHROMIC 3 0 PS 2 (SUTURE) ×9 IMPLANT
SUT CHROMIC 4 0 P 3 18 (SUTURE) IMPLANT
SUT MNCRL AB 3-0 PS2 18 (SUTURE) IMPLANT
SYR 50ML SLIP (SYRINGE) ×3 IMPLANT
TOWEL OR 17X26 10 PK STRL BLUE (TOWEL DISPOSABLE) ×3 IMPLANT
TUBE CONNECTING 12'X1/4 (SUCTIONS) ×1
TUBE CONNECTING 12X1/4 (SUCTIONS) ×2 IMPLANT
WATER TABLETS ICX (MISCELLANEOUS) ×3 IMPLANT
YANKAUER SUCT BULB TIP NO VENT (SUCTIONS) ×3 IMPLANT

## 2016-07-25 NOTE — Progress Notes (Addendum)
Pt is back from the OR arrived in stretcher. Ice to face and site assessed- minimal bleeding no packing. Pt arrived crying stating her pain was 10/10.   Paged Dr. Cena BentonVega about PRN Dilaudid 1.5 mg.   BP 118/73 and received one time order for 0.5 mg of dilaudid.   Pt states that "0.5 will not touch me". Carried on to explain MD's orders.   Pt is upset about receiving 0.5 of pain medication.   Will continue to monitor.  Leonia ReevesNatalie N Briar Sword, RN

## 2016-07-25 NOTE — Progress Notes (Signed)
ANTIBIOTIC CONSULT NOTE   Pharmacy Consult for Vancomycin Indication: endocarditis  No Known Allergies  Patient Measurements: Height: 5\' 2"  (157.5 cm) Weight: 120 lb 4.8 oz (54.6 kg) IBW/kg (Calculated) : 50.1 Adjusted Body Weight:    Vital Signs: Temp: 97.7 F (36.5 C) (01/23 1717) Temp Source: Oral (01/23 1717) BP: 129/95 (01/23 1717) Pulse Rate: 81 (01/23 1717) Intake/Output from previous day: 01/22 0701 - 01/23 0700 In: 1290 [P.O.:840; IV Piggyback:450] Out: 1650 [Urine:1650] Intake/Output from this shift: No intake/output data recorded.  Labs:  Recent Labs  07/23/16 0409 07/25/16 0425  WBC 11.9*  --   HGB 9.0*  --   PLT 318  --   CREATININE 0.60 0.67   Estimated Creatinine Clearance: 76.2 mL/min (by C-G formula based on SCr of 0.67 mg/dL).  Recent Labs  07/23/16 0946 07/25/16 1837  VANCOTROUGH 18 8*     Microbiology:   Medical History: Past Medical History:  Diagnosis Date  . Asthma   . ETOH abuse   . Hepatitis C   . IVDU (intravenous drug user)     Assessment: Vancomycin for MRSA bacteremia, tricuspid valve endocarditis, with septic lung emboli and early abscess formation.  -1/23 Vanco level 8 (AM dose got held for dental procedure)-not a trough  Goal of Therapy:  Vancomycin trough level 15-20 mcg/ml  Plan:  Continue Vancomycin 750mg  IV q8hr as all previous levels have been in goal. Recheck trough at next steady state.  Enzio Buchler S. Merilynn Finlandobertson, PharmD, BCPS Clinical Staff Pharmacist Pager [phone]  Misty Stanleyobertson, Jago Carton Stillinger 07/25/2016,8:12 PM

## 2016-07-25 NOTE — Transfer of Care (Signed)
Immediate Anesthesia Transfer of Care Note  Patient: Anna CaterConstance Colon  Procedure(s) Performed: Procedure(s): MULTIPLE EXTRACTION OF TOOTH #'s 18,19 and 21 thru 31  WITH ALVEOLOPLASTY (N/A)  Patient Location: PACU  Anesthesia Type:General  Level of Consciousness: awake, alert  and oriented  Airway & Oxygen Therapy: Patient Spontanous Breathing and Patient connected to nasal cannula oxygen  Post-op Assessment: Report given to RN, Post -op Vital signs reviewed and stable and Patient moving all extremities X 4  Post vital signs: Reviewed and stable  Last Vitals:  Vitals:   07/25/16 0840 07/25/16 1040  BP: 114/80   Pulse: 80 (!) 111  Resp: 20 (!) 21  Temp: 36.8 C 36.9 C   98/72 BP  Last Pain:  Vitals:   07/25/16 0840  TempSrc: Oral  PainSc:       Patients Stated Pain Goal: 0 (07/24/16 2223)  Complications: No apparent anesthesia complications

## 2016-07-25 NOTE — Discharge Instructions (Signed)

## 2016-07-25 NOTE — Progress Notes (Signed)
Removed two 1mg  Dilaudid IV syringes  from the pixis    Once nurse paged Dr. Cena BentonVega about pt's pain and administration of IV 0.5 mg of Dilaudid IV in the Or, Dr. Cena BentonVega put in a One time order for 0.5 mg of Dilaudid Iv.    BP 118/73 and other VS WDL.  This nurse administered 0.5 mg Dilaudid Iv.   Went to return the 1 mg of Dilaudid and waste the remaining  0.5 mg .  Scanned both 1mg  dilaudid medications and was not able to waste the 0.5 mg of dilaudid remaining.  This nurse alerted the charge nurse, Mardelle Mattendy and explained the situation.   Mardelle Mattendy was able to witness the wasting of the 0.5 mg Dilaudid.   Anna ReevesNatalie N Danissa Rundle, RN

## 2016-07-25 NOTE — Anesthesia Postprocedure Evaluation (Addendum)
Anesthesia Post Note  Patient: Anna CaterConstance Berthelot  Procedure(s) Performed: Procedure(s) (LRB): MULTIPLE EXTRACTION OF TOOTH #'s 18,19 and 21 thru 31  WITH ALVEOLOPLASTY (N/A)  Patient location during evaluation: PACU Anesthesia Type: General Level of consciousness: awake, awake and alert and oriented Pain management: pain level controlled Vital Signs Assessment: post-procedure vital signs reviewed and stable Respiratory status: spontaneous breathing, nonlabored ventilation and respiratory function stable Cardiovascular status: blood pressure returned to baseline Anesthetic complications: no       Last Vitals:  Vitals:   07/25/16 1200 07/25/16 1717  BP:  (!) 129/95  Pulse: 91 81  Resp:  18  Temp:  36.5 C    Last Pain:  Vitals:   07/25/16 1827  TempSrc:   PainSc: 10-Worst pain ever                 Tashi Band COKER

## 2016-07-25 NOTE — Progress Notes (Signed)
PRE-OPERATIVE NOTE:  07/25/2016 Anna Colon [id number]  VITALS: BP 124/81 (BP Location: Left Arm)   Pulse 100   Temp 98.3 F (36.8 C) (Oral)   Resp 16   Ht 5\' 2"  (1.575 m)   Wt 120 lb 4.8 oz (54.6 kg)   LMP 05/16/2016   SpO2 95%   BMI 22.00 kg/m   Lab Results  Component Value Date   WBC 11.9 (H) 07/23/2016   HGB 9.0 (L) 07/23/2016   HCT 29.0 (L) 07/23/2016   MCV 85.5 07/23/2016   PLT 318 07/23/2016   BMET    Component Value Date/Time   NA 134 (L) 07/25/2016 0425   K 4.6 07/25/2016 0425   CL 98 (L) 07/25/2016 0425   CO2 27 07/25/2016 0425   GLUCOSE 89 07/25/2016 0425   BUN 9 07/25/2016 0425   CREATININE 0.67 07/25/2016 0425   CALCIUM 8.9 07/25/2016 0425   GFRNONAA >60 07/25/2016 0425   GFRAA >60 07/25/2016 0425    Lab Results  Component Value Date   INR 1.51 07/08/2016   No results found for: PTT   Anna Cateronstance Virgil presents for extraction of remaining teeth with alveoloplasty and pre-prosthetic surgery as needed in the operating room with general anesthesia.   SUBJECTIVE: The patient denies any acute medical or dental changes and agrees to proceed with treatment as planned.  EXAM: No sign of acute dental changes.  ASSESSMENT: Patient is affected by chronic apical periodontitis, dental caries, multiple retained root segments, and chronic periodontitis.  PLAN: Patient agrees to proceed with treatment as planned in the operating room with general anesthesia as previously discussed and accepts the risks, benefits, and complications of the proposed treatment. Patient is aware of the risk for bleeding, bruising, swelling, infection, pain, nerve damage, soft tissue damage, root tip fracture, mandible fracture, and the risks of complications associated with the anesthesia. Patient also is aware of the potential for other complications up to and including death due to overall cardiovascular and respiratory compromise.     Charlynne Panderonald F. Kulinski, DDS

## 2016-07-25 NOTE — Progress Notes (Signed)
PROGRESS NOTE                                                                                                                                                                                                             Patient Demographics:    Anna Colon, is a 36 y.o. female, DOB - March 07, 1981, ZOX:[id number]  Admit date - 07/07/2016   Admitting Physician Eduard Clos, MD  Outpatient Primary MD for the patient is No PCP Per Patient  LOS - 18  Outpatient Specialists:None  Chief Complaint  Patient presents with  . Vomiting       Brief Narrative   36 year old female with history of asthma, hep C, IV drug use presented to the ED with weakness, fatigue and cough of 2 week duration. Patient had a arrest warrant and was taken to [place] jail. She was seen by a physician there who started on antibiotics for her illness. See was that out of the jail that she could go to the hospital and then presented to the ED. 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 Patient reported subjective fever with chills and nausea. She was tachycardic and tachypneic in the ED, afebrile. Chest x-ray concerning for septic emboli. CT angiogram of the chest showed bilateral cavitary nodules consistent with septic pulmonary emboli. Also showed findings concerning for bilateral pyelonephritis. Sepsis pathway initiated and admitted to stepdown unit. Blood cultures growing MRSA.    Subjective:   Reports that shoulder discomfort improving.   Assessment  & Plan :    Principal Problem:   MRSA bacteremia With tricuspid valve endocarditis, septic pulmonary emboli and possible septic renal emboli Secondary to IV drug use. Sepsis was improving but again became febrile. Chest x-ray showing worsening pneumonia. Check CT of the chest with contrast. -empiric IV vancomycin.  PICC line placed for poor IV access. Repeat blood  culture negative For growth. Appreciate ID consult.  - Plan is for antibiotic treatment through February 19th Continue telemetry monitoring.  Active Problems:   Sepsis (HCC) Again having fever spikes. Follow CT of the chest reports pleural effusions but no growth on evaluation of pleural fluid. Pt will need antibiotics through February 19th - needs placement. - Pt had teeth pulled given dental abscesses  Pleural effusions - Consulted CT surgery plan is for thoracentesis  and based on results there are further recommendations. Thoracentesis results pending.  Shortness of breath with pleural effusion and symptomatic anemia Received IV Lasix and 1 unit PRBC. Improved after thoracentesis  Generalized pain Possibly due to septic emboli and? Withdrawal from narcotics. Discontinued NSAIDs given anemia.Marland Kitchen  on oxycodone IR and low-dose Dilaudid. Added clonidine with good response.  Right shoulder pain Reports ongoing since admission (says the police may have dislocated her shoulder when they handcuffed her). Order Lidoderm patch. X-ray of the shoulder unremarkable. Pt reports pain over trapezius, deltoid (posterior) bicep, tricep, forearm. Exam really inconsistent with story. I suspect there may be a psych component to this. Will add Meloxicam to assist with anti inflammatory and with pain. If psychiatric component however meloxicam will not help.   IVDU (intravenous drug user) Patient plans to quit. Father concerned about visitors coming to meet her and thinks that they will encourage her to use drugs.   Tobacco abuse Counseled on cessation. nicotine patch.    ETOH abuse No signs of withdrawal. Monitor on CIWA. Added low dose clonidine    Thrombocytopenia (HCC) On admission secondary to sepsis. Resolved.    Endocarditis of tricuspid valve Continue IV antibiotic. Duration per ID. No signs of acute heart failure.  Iron deficiency anemia Added supplement   Hypophosphatemia Replenished.  Check phosphorus in the morning.  Hepatitis C Secondary to IV drug use. Viral load negative. Suggests  spontaneous clearing  Sacral pressure ulcer Wound care consulted   Code Status : Full code  Family Communication  : father at bedside.  Disposition Plan  :  Currently inpatient. Father drives a truck and usually out of home. He is worried that if he takes her home she will be by herself and will again have her boyfriend and other people coming at home and she will be back on drugs. Reports that this has happened before. He is hoping if she will qualify to go to skilled nursing facility. Social work following. Plan on discharge if patient remains afebrile.  Barriers For Discharge : Active symptoms  Consults  : Infectious disease  Procedures  :  CT angiogram of the chest CT abdomen and pelvis 2-D echo  DVT Prophylaxis  :  Lovenox   Lab Results  Component Value Date   PLT 318 07/23/2016    Antibiotics  :   Anti-infectives    Start     Dose/Rate Route Frequency Ordered Stop   07/18/16 1200  vancomycin (VANCOCIN) IVPB 750 mg/150 ml premix     750 mg 150 mL/hr over 60 Minutes Intravenous Every 8 hours 07/18/16 1002     07/08/16 1515  vancomycin (VANCOCIN) IVPB 1000 mg/200 mL premix  Status:  Discontinued     1,000 mg 200 mL/hr over 60 Minutes Intravenous Every 8 hours 07/08/16 1501 07/18/16 0556   07/07/16 2200  piperacillin-tazobactam (ZOSYN) IVPB 3.375 g  Status:  Discontinued     3.375 g 12.5 mL/hr over 240 Minutes Intravenous Every 8 hours 07/07/16 1605 07/07/16 1754   07/07/16 1615  vancomycin (VANCOCIN) IVPB 750 mg/150 ml premix  Status:  Discontinued     750 mg 150 mL/hr over 60 Minutes Intravenous Every 8 hours 07/07/16 1605 07/08/16 1501   07/07/16 0813  piperacillin-tazobactam (ZOSYN) IVPB 3.375 g     3.375 g 100 mL/hr over 30 Minutes Intravenous  Once 07/07/16 0644 07/07/16 0839   07/07/16 0215  piperacillin-tazobactam (ZOSYN) IVPB 3.375 g     3.375 g 100  mL/hr over 30  Minutes Intravenous  Once 07/07/16 0213 07/07/16 0313   07/07/16 0215  vancomycin (VANCOCIN) IVPB 1000 mg/200 mL premix     1,000 mg 200 mL/hr over 60 Minutes Intravenous  Once 07/07/16 0213 07/07/16 0343        Objective:   Vitals:   07/25/16 1057 07/25/16 1100 07/25/16 1105 07/25/16 1126  BP: 99/63   118/73  Pulse: (!) 103 (!) 103  98  Resp: (!) 21 (!) 21  (!) 22  Temp:   97.9 F (36.6 C) 98.2 F (36.8 C)  TempSrc:    Oral  SpO2: 96% 96% 93% 94%  Weight:      Height:        Wt Readings from Last 3 Encounters:  07/23/16 54.6 kg (120 lb 4.8 oz)     Intake/Output Summary (Last 24 hours) at 07/25/16 1149 Last data filed at 07/25/16 1042  Gross per 24 hour  Intake             1550 ml  Output             1700 ml  Net             -150 ml     Physical Exam  Gen: alert and awake, in nad. Reports pain HEENT:  Pallor present, moist mucosa, supple neck Chest: CTA BL, no wheezes, equal chest rise. CVS:S1&S2 regular, no murmurs  GI: soft, ND , nontender Musculoskeletal: warm, no edema, does not let me examine her right arm and shoulder well CNS: Alert and oriented    Data Review:    CBC  Recent Labs Lab 07/23/16 0409  WBC 11.9*  HGB 9.0*  HCT 29.0*  PLT 318  MCV 85.5  MCH 26.5  MCHC 31.0  RDW 16.4*    Chemistries   Recent Labs Lab 07/19/16 1411 07/21/16 0452 07/23/16 0409 07/25/16 0425  NA  --   --  133* 134*  K  --   --  4.6 4.6  CL  --   --  97* 98*  CO2  --   --  28 27  GLUCOSE  --   --  104* 89  BUN  --   --  11 9  CREATININE 0.70 0.59 0.60 0.67  CALCIUM  --   --  8.8* 8.9   ------------------------------------------------------------------------------------------------------------------ No results for input(s): CHOL, HDL, LDLCALC, TRIG, CHOLHDL, LDLDIRECT in the last 72 hours.  No results found for: HGBA1C ------------------------------------------------------------------------------------------------------------------ No  results for input(s): TSH, T4TOTAL, T3FREE, THYROIDAB in the last 72 hours.  Invalid input(s): FREET3 ------------------------------------------------------------------------------------------------------------------ No results for input(s): VITAMINB12, FOLATE, FERRITIN, TIBC, IRON, RETICCTPCT in the last 72 hours.  Coagulation profile No results for input(s): INR, PROTIME in the last 168 hours.  No results for input(s): DDIMER in the last 72 hours.  Cardiac Enzymes No results for input(s): CKMB, TROPONINI, MYOGLOBIN in the last 168 hours.  Invalid input(s): CK ------------------------------------------------------------------------------------------------------------------ No results found for: BNP  Inpatient Medications  Scheduled Meds: . cloNIDine  0.1 mg Oral BID  . enoxaparin (LOVENOX) injection  40 mg Subcutaneous Q24H  . feeding supplement (ENSURE ENLIVE)  237 mL Oral BID BM  . ferrous sulfate  325 mg Oral BID WC  . folic acid  1 mg Oral Daily  . lidocaine  1 patch Transdermal Q24H  . meloxicam  15 mg Oral Daily  . multivitamin with minerals  1 tablet Oral Daily  . nicotine  21 mg Transdermal Daily  . sodium chloride flush  10-40 mL Intracatheter Q12H  . thiamine  100 mg Oral Daily  . triamcinolone ointment   Topical BID  . vancomycin  750 mg Intravenous Q8H   Continuous Infusions: . lactated ringers     PRN Meds:.acetaminophen **OR** acetaminophen, albuterol, hydrALAZINE, HYDROmorphone (DILAUDID) injection, HYDROmorphone (DILAUDID) injection, ondansetron **OR** ondansetron (ZOFRAN) IV, oxyCODONE, polyethylene glycol, sodium chloride flush, zolpidem  Micro Results Recent Results (from the past 240 hour(s))  Culture, body fluid-bottle     Status: None (Preliminary result)   Collection Time: 07/20/16 11:27 AM  Result Value Ref Range Status   Specimen Description PERITONEAL  Final   Special Requests NONE  Final   Culture NO GROWTH 4 DAYS  Final   Report Status  PENDING  Incomplete  Gram stain     Status: None   Collection Time: 07/20/16 11:27 AM  Result Value Ref Range Status   Specimen Description PERITONEAL  Final   Special Requests NONE  Final   Gram Stain   Final    FEW WBC PRESENT,BOTH PMN AND MONONUCLEAR NO ORGANISMS SEEN    Report Status 07/20/2016 FINAL  Final    Radiology Reports Dg Orthopantogram  Result Date: 07/20/2016 CLINICAL DATA:  Sepsis.  Tooth abscess. EXAM: ORTHOPANTOGRAM/PANORAMIC COMPARISON:  CT 02/18/2011. FINDINGS: No acute bony abnormality identified. No evidence of fracture or dislocation. Periapical lucencies noted throughout the remaining teeth along the mandible suggesting dental disease. No prominent lytic lesion noted. No fracture . If further evaluation is needed maxillofacial CT can be obtained. IMPRESSION: Muscle mild periapical lucencies are noted throughout the remaining teeth of the lower mandible. No prominent lytic lesion noted. Electronically Signed   By: Maisie Fushomas  Register   On: 07/20/2016 11:19   Dg Chest 1 View  Result Date: 07/20/2016 CLINICAL DATA:  Status post right thoracentesis. EXAM: CHEST 1 VIEW COMPARISON:  07/16/2016 FINDINGS: Right arm PICC line tip is in the projection of the cavoatrial junction. Normal heart size. Bilateral pleural effusions are identified, left greater than right. There is mild interstitial edema. Persistent bilateral mid lung and lower lobe opacities. IMPRESSION: 1. No change in bilateral pleural effusions and bilateral lung opacities. Electronically Signed   By: Signa Kellaylor  Stroud M.D.   On: 07/20/2016 11:08   Dg Chest 2 View  Result Date: 07/21/2016 CLINICAL DATA:  Sepsis. EXAM: CHEST  2 VIEW COMPARISON:  07/20/2016. FINDINGS: Right PICC line stable position. Heart size stable. Multifocal bilateral pulmonary infiltrates and bilateral pleural effusions again noted. Pulmonary infiltrates have progressed slightly. IMPRESSION: 1.  Right PICC line stable position. 2. Progressive  multifocal bilateral pulmonary infiltrates. Persistent bilateral pleural effusions. Electronically Signed   By: Maisie Fushomas  Register   On: 07/21/2016 12:03   Dg Chest 2 View  Result Date: 07/13/2016 CLINICAL DATA:  Pneumonia EXAM: CHEST  2 VIEW COMPARISON:  07/08/2016 FINDINGS: Bilateral pleural effusions, left greater than right with lower lobe airspace opacities, also worse on the left. This is improved somewhat on the right, not significantly changed on the left. Right PICC line is in place with the tip at the cavoatrial junction. Low lung volumes. Heart is normal size. IMPRESSION: Bilateral lower lobe airspace opacities, left greater than right. Some improvement on the right since prior study with no change on the left. Small bilateral effusions. Electronically Signed   By: Charlett NoseKevin  Dover M.D.   On: 07/13/2016 13:19   Dg Chest 2 View  Result Date: 07/07/2016 CLINICAL DATA:  Nausea vomiting and diarrhea for 8 days EXAM: CHEST  2 VIEW COMPARISON:  None. FINDINGS: Numerous ill-defined nodules throughout both lungs, many cavitary. No confluent consolidation. No large effusion. Hilar, mediastinal and cardiac contours are unremarkable. IMPRESSION: Innumerable nodules, many cavitary. Septic emboli are a leading consideration. Noninfectious inflammatory process or neoplastic process are less likely possibilities. Electronically Signed   By: Ellery Plunk M.D.   On: 07/07/2016 01:55   Dg Shoulder Right  Result Date: 07/18/2016 CLINICAL DATA:  Right shoulder pain with fever and sepsis. EXAM: RIGHT SHOULDER - 2+ VIEW COMPARISON:  Chest x-ray 07/16/2016 FINDINGS: Right-sided PICC line unchanged. Bones, joint spaces and soft tissues over the right shoulder are within normal. Stable changes within the right lung. IMPRESSION: Unremarkable right shoulder. Electronically Signed   By: Elberta Fortis M.D.   On: 07/18/2016 10:49   Ct Chest W Contrast  Result Date: 07/19/2016 CLINICAL DATA:  Pneumonia, cough, fever,  septic embolism, history hepatitis-C, asthma, IV drug abuse, followup EXAM: CT CHEST WITH CONTRAST TECHNIQUE: Multidetector CT imaging of the chest was performed during intravenous contrast administration. Sagittal and coronal MPR images reconstructed from axial data set. CONTRAST:  75 mL ISOVUE-300 IOPAMIDOL (ISOVUE-300) INJECTION 61% IV COMPARISON:  07/07/2016 FINDINGS: Cardiovascular: Vascular structures grossly patent on nondedicated exam. RIGHT arm PICC line with tip in SVC near cavoatrial junction. No pericardial effusion. Mediastinum/Nodes: Few scattered normal size mediastinal lymph nodes. No definite thoracic adenopathy. Esophagus unremarkable. Base of cervical region normal appearance. Lungs/Pleura: Multiple cavitary nodules are seen within both lungs consistent with septic emboli. Number of nodules has decreased since the previous exam, with some of the smaller nodule seen previously no longer identified. Remaining nodules are stable to decreased in sizes ; RIGHT upper lobe nodule measured previously at 21 mm now measures 16 mm image 32 and previously measured LEFT upper lobe nodule of 23 mm measures 16 mm at present. BILATERAL pleural effusions with compressive atelectasis of the lower lobes. No pneumothorax. Upper Abdomen: Beam hardening artifacts from patient's RIGHT arm. Spleen appears enlarged. No additional upper abdominal abnormalities seen. Musculoskeletal: No acute osseous findings. IMPRESSION: New BILATERAL pleural effusions with compressive atelectasis of the lower lobes. Cavitary lung nodules bilaterally compatible with septic emboli, decreased in number with remaining nodules stable to decreased in sizes since the previous exam. Electronically Signed   By: Ulyses Southward M.D.   On: 07/19/2016 13:01   Ct Angio Chest Pe W And/or Wo Contrast  Result Date: 07/07/2016 CLINICAL DATA:  Short of breath fever, tachycardia. IV drug abuse. Leukocytosis. EXAM: CT ANGIOGRAPHY CHEST CT ABDOMEN AND PELVIS  WITH CONTRAST TECHNIQUE: Multidetector CT imaging of the chest was performed using the standard protocol during bolus administration of intravenous contrast. Multiplanar CT image reconstructions and MIPs were obtained to evaluate the vascular anatomy. Multidetector CT imaging of the abdomen and pelvis was performed using the standard protocol during bolus administration of intravenous contrast. CONTRAST:  100 mL Isovue COMPARISON:  Chest radiograph 07/07/2016 FINDINGS: CTA CHEST FINDINGS Cardiovascular: No filling defects within the pulmonary to suggest acute pulmonary embolism. No acute findings of the aorta great vessels. No pericardial effusion. Heart is grossly normal. Mediastinum/Nodes: No axillary or supraclavicular adenopathy. No mediastinal hilar adenopathy. Ill-defined peribronchial thickening in the hila. Lungs/Pleura: Bilateral thick-walled cavitary nodules in the upper and lower lobes. Consolidated airspace disease in the lower lobes. Approximately 30 nodules per lung. Example nodule cavitary nodule in the RIGHT upper lobe measures 21 mm (image 27, series 6). Example cavitary nodule in the LEFT upper lobe measures 23 mm on image 30 series 6. Musculoskeletal: No aggressive  osseous lesion. Review of the MIP images confirms the above findings. CT ABDOMEN and PELVIS FINDINGS Hepatobiliary: Liver is enlarged. No focal hepatic lesion. Periportal edema noted. Portal veins are patent. Gallbladder wall is edematous and thickened to 6 mm. The gallbladder lumen is contracted. Common bile duct normal caliber. Pancreas: No pancreatic inflammation or duct dilatation Spleen: Spleen is enlarged measuring 14.7 by 7.5 x 12.4 cm (volume = 720 cm^3). Low-density lesion in the spleen measuring 12 mm does not have simple fluid attenuation. Adrenals/Urinary Tract: Adrenal glands are normal. There is heterogeneous enhancement of the kidneys on the cortical phase imaging (image 32, series 2 for example). On the delayed pyelogram  phase imaging the kidneys have a striated appearance (image 14 series 17). No renal obstruction. Bladder normal Stomach/Bowel: Stomach, small bowel, appendix, and cecum are normal. The colon and rectosigmoid colon are normal. Vascular/Lymphatic: Abdominal aorta is normal caliber. No abdominal lymphadenopathy. Reproductive: Uterus and ovaries grossly normal Other: Smaller free fluid in the posterior pelvis Musculoskeletal: No aggressive osseous lesion. Review of the MIP images confirms the above findings. IMPRESSION: Chest Impression: 1. Bilateral thick-walled cavitary nodules consistent with SEPTIC PULMONARY EMBOLI in this population (young patient with IV drug use). 2. Bibasilar consolidation consistent with pneumonia Abdomen / Pelvis Impression: 1. 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 2. Hepatosplenomegaly. Low-density lesion in the spleen is indeterminate. 3. Small volume of intraperitoneal free fluid and periportal edema may relate to hepatic dysfunction. Electronically Signed   By: Genevive Bi M.D.   On: 07/07/2016 09:29   Ct Abdomen Pelvis W Contrast  Result Date: 07/07/2016 CLINICAL DATA:  Short of breath fever, tachycardia. IV drug abuse. Leukocytosis. EXAM: CT ANGIOGRAPHY CHEST CT ABDOMEN AND PELVIS WITH CONTRAST TECHNIQUE: Multidetector CT imaging of the chest was performed using the standard protocol during bolus administration of intravenous contrast. Multiplanar CT image reconstructions and MIPs were obtained to evaluate the vascular anatomy. Multidetector CT imaging of the abdomen and pelvis was performed using the standard protocol during bolus administration of intravenous contrast. CONTRAST:  100 mL Isovue COMPARISON:  Chest radiograph 07/07/2016 FINDINGS: CTA CHEST FINDINGS Cardiovascular: No filling defects within the pulmonary to suggest acute pulmonary embolism. No acute findings of the aorta great vessels. No pericardial effusion. Heart is grossly  normal. Mediastinum/Nodes: No axillary or supraclavicular adenopathy. No mediastinal hilar adenopathy. Ill-defined peribronchial thickening in the hila. Lungs/Pleura: Bilateral thick-walled cavitary nodules in the upper and lower lobes. Consolidated airspace disease in the lower lobes. Approximately 30 nodules per lung. Example nodule cavitary nodule in the RIGHT upper lobe measures 21 mm (image 27, series 6). Example cavitary nodule in the LEFT upper lobe measures 23 mm on image 30 series 6. Musculoskeletal: No aggressive osseous lesion. Review of the MIP images confirms the above findings. CT ABDOMEN and PELVIS FINDINGS Hepatobiliary: Liver is enlarged. No focal hepatic lesion. Periportal edema noted. Portal veins are patent. Gallbladder wall is edematous and thickened to 6 mm. The gallbladder lumen is contracted. Common bile duct normal caliber. Pancreas: No pancreatic inflammation or duct dilatation Spleen: Spleen is enlarged measuring 14.7 by 7.5 x 12.4 cm (volume = 720 cm^3). Low-density lesion in the spleen measuring 12 mm does not have simple fluid attenuation. Adrenals/Urinary Tract: Adrenal glands are normal. There is heterogeneous enhancement of the kidneys on the cortical phase imaging (image 32, series 2 for example). On the delayed pyelogram phase imaging the kidneys have a striated appearance (image 14 series 17). No renal obstruction. Bladder normal Stomach/Bowel: Stomach, small  bowel, appendix, and cecum are normal. The colon and rectosigmoid colon are normal. Vascular/Lymphatic: Abdominal aorta is normal caliber. No abdominal lymphadenopathy. Reproductive: Uterus and ovaries grossly normal Other: Smaller free fluid in the posterior pelvis Musculoskeletal: No aggressive osseous lesion. Review of the MIP images confirms the above findings. IMPRESSION: Chest Impression: 1. Bilateral thick-walled cavitary nodules consistent with SEPTIC PULMONARY EMBOLI in this population (young patient with IV drug  use). 2. Bibasilar consolidation consistent with pneumonia Abdomen / Pelvis Impression: 1. 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 2. Hepatosplenomegaly. Low-density lesion in the spleen is indeterminate. 3. Small volume of intraperitoneal free fluid and periportal edema may relate to hepatic dysfunction. Electronically Signed   By: Genevive Bi M.D.   On: 07/07/2016 09:29   Dg Chest Port 1 View  Result Date: 07/16/2016 CLINICAL DATA:  MRSA bacteremia with septic pulmonary emboli. EXAM: PORTABLE CHEST 1 VIEW COMPARISON:  07/13/2016. FINDINGS: Right PICC tip at the superior cavoatrial junction. Normal sized heart. Mildly increased bilateral airspace opacity and bilateral pleural fluid. Unremarkable bones. IMPRESSION: Mildly increased bilateral pneumonia and pleural fluid. Electronically Signed   By: Beckie Salts M.D.   On: 07/16/2016 09:53   Dg Chest Port 1 View  Result Date: 07/08/2016 CLINICAL DATA:  Patient admitted yesterday with MR SA bacteremia. Possible aspiration today. EXAM: PORTABLE CHEST 1 VIEW COMPARISON:  CT chest and PA and lateral chest 07/07/2014. FINDINGS: Multiple nodular opacities bilaterally again seen. There is increased bibasilar airspace disease. No pneumothorax. Small pleural effusions noted. Heart size is normal. IMPRESSION: Increased bibasilar airspace disease could be due to atelectasis, aspiration and/or pneumonia. Multifocal nodular opacities compatible with septic emboli as seen on chest CT yesterday. Electronically Signed   By: Drusilla Kanner M.D.   On: 07/08/2016 12:07   US Thoracentesis Asp Pleural Space W/img Guide  Result Date: 07/20/2016 INDICATION: Pneumonia, cough, fever, septic embolism, history of hepatitis-C, asthma, IV drug abuse, bilateral pleural effusions seen on CT scan. Request for diagnostic and therapeutic thoracentesis. EXAM: ULTRASOUND GUIDED RIGHT THORACENTESIS MEDICATIONS: 1% Lidocaine = 10 mL. COMPLICATIONS:  None immediate. PROCEDURE: An ultrasound guided thoracentesis was thoroughly discussed with the patient and questions answered. The benefits, risks, alternatives and complications were also discussed. The patient understands and wishes to proceed with the procedure. Written consent was obtained. Ultrasound was performed to localize and mark an adequate pocket of fluid in the right chest. The effusion appeared loculated. The area was then prepped and draped in the normal sterile fashion. 1% Lidocaine was used for local anesthesia. Under ultrasound guidance a 6 Fr Safe-T-Centesis catheter was introduced. Thoracentesis was performed. The catheter was removed and a dressing applied. FINDINGS: A total of approximately 160 mL of blood tinged fluid was removed. Samples were sent to the laboratory as requested by the clinical team. IMPRESSION: Successful ultrasound guided right thoracentesis yielding only 160 mL of pleural fluid due to loculations. Read by:  Corrin Parker, PA-C Electronically Signed   By: Richarda Overlie M.D.   On: 07/20/2016 10:38    Time Spent in minutes 35   Penny Pia M.D on 07/25/2016 at 11:49 AM  Between 7am to 7pm - Pager - [phone]  After 7pm go to [email or web address] - password [email or web address]  [facility] -  Office  [phone]

## 2016-07-25 NOTE — Anesthesia Preprocedure Evaluation (Addendum)
Anesthesia Evaluation  Patient identified by MRN, date of birth, ID band Patient awake    Reviewed: Allergy & Precautions, NPO status , Patient's Chart, lab work & pertinent test results  Airway Mallampati: II  TM Distance: >3 FB Neck ROM: Full    Dental  (+) Poor Dentition   Pulmonary Current Smoker,    breath sounds clear to auscultation (-) decreased breath sounds      Cardiovascular  Rhythm:Regular Rate:Normal     Neuro/Psych    GI/Hepatic   Endo/Other    Renal/GU      Musculoskeletal   Abdominal   Peds  Hematology   Anesthesia Other Findings   Reproductive/Obstetrics                            Anesthesia Physical Anesthesia Plan  ASA: III  Anesthesia Plan: General   Post-op Pain Management:    Induction: Intravenous  Airway Management Planned: Oral ETT  Additional Equipment:   Intra-op Plan:   Post-operative Plan: Extubation in OR  Informed Consent: I have reviewed the patients History and Physical, chart, labs and discussed the procedure including the risks, benefits and alternatives for the proposed anesthesia with the patient or authorized representative who has indicated his/her understanding and acceptance.   Dental advisory given  Plan Discussed with: CRNA and Anesthesiologist  Anesthesia Plan Comments:        Anesthesia Quick Evaluation

## 2016-07-25 NOTE — Op Note (Signed)
OPERATIVE REPORT  Patient:            Anna Colon Date of Birth:  03-Mar-1981 MRN:                [id number]   DATE OF PROCEDURE:  07/25/2016  PREOPERATIVE DIAGNOSES: 1. Endocarditis of tricuspid valve  2. Chronic apical periodontitis 3. Dental caries 4. Multiple retained root segments 5. Chronic periodontitis   POSTOPERATIVE DIAGNOSES: 1. Endocarditis of tricuspid valve  2. Chronic apical periodontitis 3. Dental caries 4. Multiple retained root segments 5. Chronic periodontitis  OPERATIONS: 1. Multiple extraction of tooth numbers 18, 19, and 21-31 2. 2 Quadrants of alveoloplasty   SURGEON: Charlynne Pander, DDS  ASSISTANT: Rory Percy, (dental assistant)  ANESTHESIA: General anesthesia via oral endotracheal tube.  MEDICATIONS: 1. Ancef 2 g IV prior to invasive dental procedures. 2. Local anesthesia with a total utilization of 2 carpules each containing 34 mg of lidocaine with 0.017 mg of epinephrine as well as 2 carpules each containing 9 mg of bupivacaine with 0.009 mg of epinephrine.  SPECIMENS: There are 13 teeth that were discarded.  DRAINS: None  CULTURES: None  COMPLICATIONS: None   ESTIMATED BLOOD LOSS: 100 mLs.  INTRAVENOUS FLUIDS: Lactated ringers solution per anesthesia records  INDICATIONS: The patient was recently diagnosed with endocarditis of the tricuspid valve.  A dental consultation was then requested to evaluate poor dentition as a source of the endocarditis.  The patient was examined and treatment planned for extraction of remaining teeth with alveoloplasty in the operating room with general anesthesia. This treatment plan was formulated to decrease the risks and complications associated with dental infection from further affecting the patient's systemic health and to prevent infection of possible future tricuspid valve surgery.  OPERATIVE FINDINGS: Patient was examined operating room number 15.  The teeth #'s 18,19, and 21-31 were  identified for extraction. The patient was noted be affected by  chronic apical periodontitis, multiple retained root segments, dental caries, and chronic periodontitis.   DESCRIPTION OF PROCEDURE: Patient was brought to the main operating room number 15. Patient was then placed in the supine position on the operating table. General  anesthesia was then induced per the anesthesia team. The patient was then prepped and draped in the usual manner for dental medicine procedure. A timeout was performed. The patient was identified and procedures were verified. A throat pack was placed at this time. The oral cavity was then thoroughly examined with the findings noted above. The patient was then ready for dental medicine procedure as follows:  Local anesthesia was then administered sequentially with a total utilization of 2 carpules each containing 34 mg of lidocaine with 0.017 mg of epinephrine as well as 2 carpules  each containing 9 mg bupivacaine with 0.009 mg of epinephrine.  The Maxillary left and right quadrants first approached. The patient had an edentulous maxilla with no retained root segments or root tips noted. At this point time, the mandibular quadrants were approached. The patient was given bilateral inferior alveolar nerve blocks and long buccal nerve blocks utilizing the bupivacaine with epinephrine. Further infiltration was then achieved utilizing the lidocaine with epinephrine. A 15 blade incision was then made from the distal of number 17 and extended to the distal of #31.  A surgical flap was then carefully reflected. The lower teeth were then subluxated with a series of straight elevators. Tooth numbers 18,19, and 21-31 were then removed utilizing a 151 forceps and/or rongeurs without complications. Alveoloplasty was then performed  utilizing a rongeurs and bone file. The tissues were approximated and trimmed appropriately. The surgical sites were then irrigated with copious amounts of sterile  saline.  A piece of Surgicel was then placed in the extraction sockets appropriately.The mandibular left surgical site was then closed from the distal of #17 and extended  to the mesial of 24 utilizing 3-0 chromic gut suture in a continuous interrupted suture technique 1.  2 individual interrupted sutures were then placed to further closed surgical site. The mandibular right surgical site was then closed from the distal of #31 and extended the mesial #25 utilizing 3-0 chromic gut suture in a continuous interrupted suture technique 1.  At this point time, the entire mouth was irrigated with copious amounts of sterile saline. The patient was examined for complications, seeing none, the dental medicine procedure was deemed to be complete. The throat pack was removed at this time. An oral airway was then placed at the request of the anesthesia team. A series of 4 x 4 gauze were placed in the mouth to aid hemostasis. The patient was then handed over to the anesthesia team for final disposition. After an appropriate amount of time, the patient was extubated and taken to the postanesthsia care unit in good condition. All counts were correct for the dental medicine procedure.  The Lovenox therapy may be restarted this evening at 2200 if  no active bleeding.   Charlynne Panderonald F. Kulinski, DDS.

## 2016-07-25 NOTE — Progress Notes (Signed)
Nutrition Follow-up  DOCUMENTATION CODES:   Not applicable  INTERVENTION:  Once diet advances, Provide Ensure Enlive po TID, each supplement provides 350 kcal and 20 grams of protein.  Provide 30 ml Prostat po BID, each supplement provides 100 kcal and 15 grams of protein.   Encourage adequate PO intake.   NUTRITION DIAGNOSIS:   Inadequate oral intake related to  (decreased appetite) as evidenced by meal completion < 50%; ongoing  GOAL:   Patient will meet greater than or equal to 90% of their needs; progressing  MONITOR:   PO intake, Supplement acceptance  REASON FOR ASSESSMENT:   Malnutrition Screening Tool    ASSESSMENT:   Pt with hx of IV drug abuse, ETOH, hepatitis C, who was released from jail 1/4 admitted with sepsis, bilateral cavitary PNA, MRSA bacteremia and probable tricuspid valve endocarditis with septic pulmonary emboli.  Pt with endocarditis of tricuspid valve, chronic apical periodontitis, dental caries, multiple retained root segments, and chronic periodontitis.  OPERATIONS (1/23): 1. Multiple extraction of tooth numbers 18, 19, and 21-31 2. 2 Quadrants of alveoloplasty  RD consulted as pt edentulous from tooth extraction procedure today. Pt is currently on a clear liquid diet. Orders given for diet advancement as tolerated. Meal completion prior to procedure/NPO today was 50-100%. Pt has been consuming her Ensure shakes prior today. As pt edentulous, RD to increase nutritional supplements to ensure adequate nutrition needs are met. RD to increase Ensure shakes to TID and order Prostat. Labs and medications reviewed.   Diet Order:  Diet clear liquid Room service appropriate? No; Fluid consistency: Thin  Skin:  Wound (see comment) (Stage II to sacrum)  Last BM:  1/22  Height:   Ht Readings from Last 1 Encounters:  07/19/16 5' 2" (1.575 m)    Weight:   Wt Readings from Last 1 Encounters:  07/23/16 120 lb 4.8 oz (54.6 kg)    Ideal Body Weight:   50 kg  BMI:  Body mass index is 22 kg/m.  Estimated Nutritional Needs:   Kcal:  1700-1900  Protein:  80-90 grams  Fluid:  1.7 - 1.9 L/day  EDUCATION NEEDS:   No education needs identified at this time  Corrin Parker, MS, RD, LDN Pager # [phone] After hours/ weekend pager # [phone]

## 2016-07-25 NOTE — Anesthesia Procedure Notes (Signed)
Procedure Name: Intubation Date/Time: 07/25/2016 9:22 AM Performed by: Merrilyn Puma B Pre-anesthesia Checklist: Patient identified, Emergency Drugs available, Suction available, Patient being monitored and Timeout performed Patient Re-evaluated:Patient Re-evaluated prior to inductionOxygen Delivery Method: Circle system utilized Preoxygenation: Pre-oxygenation with 100% oxygen Intubation Type: IV induction Ventilation: Mask ventilation without difficulty Laryngoscope Size: Mac and 3 Grade View: Grade I Tube type: Oral Tube size: 7.0 mm Number of attempts: 1 Airway Equipment and Method: Stylet Placement Confirmation: ETT inserted through vocal cords under direct vision,  positive ETCO2,  CO2 detector and breath sounds checked- equal and bilateral Secured at: 21 cm Tube secured with: Tape Dental Injury: Teeth and Oropharynx as per pre-operative assessment

## 2016-07-26 ENCOUNTER — Encounter (HOSPITAL_COMMUNITY): Payer: Self-pay | Admitting: Dentistry

## 2016-07-26 DIAGNOSIS — K08409 Partial loss of teeth, unspecified cause, unspecified class: Secondary | ICD-10-CM

## 2016-07-26 MED ORDER — SODIUM CHLORIDE 0.9 % IR SOLN
200.0000 mL | Status: DC
  Administered 2016-07-26: 200 mL

## 2016-07-26 MED ORDER — OXYCODONE HCL 5 MG PO TABS
15.0000 mg | ORAL_TABLET | ORAL | Status: DC | PRN
  Administered 2016-07-26 – 2016-08-04 (×40): 15 mg via ORAL
  Filled 2016-07-26 (×40): qty 3

## 2016-07-26 NOTE — Progress Notes (Signed)
POST OPERATIVE NOTE:  07/26/2016 Anna Colon [id number]  VITALS: BP 112/74 (BP Location: Left Arm)   Pulse 71   Temp 97.5 F (36.4 C) (Oral)   Resp 19   Ht 5\' 2"  (1.575 m)   Wt 120 lb 4.8 oz (54.6 kg)   LMP 05/16/2016   SpO2 100%   BMI 22.00 kg/m   LABS:  Lab Results  Component Value Date   WBC 11.9 (H) 07/23/2016   HGB 9.0 (L) 07/23/2016   HCT 29.0 (L) 07/23/2016   MCV 85.5 07/23/2016   PLT 318 07/23/2016   BMET    Component Value Date/Time   NA 134 (L) 07/25/2016 0425   K 4.6 07/25/2016 0425   CL 98 (L) 07/25/2016 0425   CO2 27 07/25/2016 0425   GLUCOSE 89 07/25/2016 0425   BUN 9 07/25/2016 0425   CREATININE 0.67 07/25/2016 0425   CALCIUM 8.9 07/25/2016 0425   GFRNONAA >60 07/25/2016 0425   GFRAA >60 07/25/2016 0425    Lab Results  Component Value Date   INR 1.51 07/08/2016   No results found for: PTT   Anna CaterConstance Lott is status post extraction of remaining teeth with alveoloplasty in the OR on 07/25/16.  SUBJECTIVE: Patient complaining of discomfort from dental extraction sites. No bleeding by report.  EXAM: No sign of oral infection, heme, or ooze. Sutures intact. Generalized primary closure noted.  ASSESSMENT: Post operative course is consistent with dental procedures performed. Patient is now edentulous.  PLAN: 1. Salt water rinses every two hours while awake. 2. Advance diet as tolerated. Appreciate Nutritional consult and help. 3. Sutures may dissolve on their own. 4. F/U with DDS of choice for denture fabrication after adequate healing.   Charlynne Panderonald F. Dailyn Reith, DDS

## 2016-07-26 NOTE — Progress Notes (Signed)
PROGRESS NOTE                                                                                                                                                                                                             Patient Demographics:    Anna Colon, is a 37 y.o. female, DOB - 13-Oct-1980, JYN:[id number]  Admit date - 07/07/2016   Admitting Physician Eduard Clos, MD  Outpatient Primary MD for the patient is No PCP Per Patient  LOS - 19  Outpatient Specialists:None  Chief Complaint  Patient presents with  . Vomiting       Brief Narrative   36 year old female with history of asthma, hep C, IV drug use presented to the ED with weakness, fatigue and cough of 2 week duration. Patient had a arrest warrant and was taken to [facility]. She was seen by a physician there who started on antibiotics for her illness. See was that out of the jail that she could go to the hospital and then presented to the ED. 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 Patient reported subjective fever with chills and nausea. She was tachycardic and tachypneic in the ED, afebrile. Chest x-ray concerning for septic emboli. CT angiogram of the chest showed bilateral cavitary nodules consistent with septic pulmonary emboli. Also showed findings concerning for bilateral pyelonephritis. Sepsis pathway initiated and admitted to stepdown unit. Blood cultures growing MRSA.    Subjective:   Reports that shoulder discomfort improving.   Assessment  & Plan :    Principal Problem:   MRSA bacteremia With tricuspid valve endocarditis, septic pulmonary emboli and possible septic renal emboli Secondary to IV drug use. Sepsis was improving but again became febrile. Chest x-ray showing worsening pneumonia. Check CT of the chest with contrast. -empiric IV vancomycin.  PICC line placed for poor IV access. Repeat blood  culture negative For growth. Appreciate ID consult.  - Plan is for antibiotic treatment through February 19th Continue telemetry monitoring.  Active Problems:   Sepsis (HCC) Again having fever spikes. Follow CT of the chest reports pleural effusions but no growth on evaluation of pleural fluid. Pt will need antibiotics through February 19th - needs placement. - Pt had teeth pulled given dental abscesses, pain control a problem due to history of opiod abuse. Will  increase Oxy IR dose  Pleural effusions - Consulted CT surgery plan is for thoracentesis and based on results there are further recommendations. Thoracentesis results pending.  Shortness of breath with pleural effusion and symptomatic anemia Received IV Lasix and 1 unit PRBC. Improved after thoracentesis  Generalized pain Possibly due to septic emboli and? Withdrawal from narcotics. Discontinued NSAIDs given anemia.Marland Kitchen.  on oxycodone IR and low-dose Dilaudid. Added clonidine with good response.  Right shoulder pain Reports ongoing since admission (says the police may have dislocated her shoulder when they handcuffed her). Order Lidoderm patch. X-ray of the shoulder unremarkable. Pt reports pain over trapezius, deltoid (posterior) bicep, tricep, forearm. Exam really inconsistent with story. I suspect there may be a psych component to this.  - improving with meloxicam use.  IVDU (intravenous drug user) Patient plans to quit. Father concerned about visitors coming to meet her and thinks that they will encourage her to use drugs.   Tobacco abuse Counseled on cessation. nicotine patch.    ETOH abuse No signs of withdrawal. Monitor on CIWA. Added low dose clonidine    Thrombocytopenia (HCC) On admission secondary to sepsis. Resolved.    Endocarditis of tricuspid valve Continue IV antibiotic. Duration per ID. No signs of acute heart failure.  Iron deficiency anemia Added supplement   Hypophosphatemia Replenished. Check  phosphorus in the morning.  Hepatitis C Secondary to IV drug use. Viral load negative. Suggests  spontaneous clearing  Sacral pressure ulcer Wound care consulted   Code Status : Full code  Family Communication  : father at bedside.  Disposition Plan  :  Currently inpatient. Father drives a truck and usually out of home. He is worried that if he takes her home she will be by herself and will again have her boyfriend and other people coming at home and she will be back on drugs. Reports that this has happened before. He is hoping if she will qualify to go to skilled nursing facility. Social work following. Plan on discharge if patient remains afebrile.  Barriers For Discharge : Active symptoms  Consults  : Infectious disease  Procedures  :  CT angiogram of the chest CT abdomen and pelvis 2-D echo  DVT Prophylaxis  :  Lovenox   Lab Results  Component Value Date   PLT 318 07/23/2016    Antibiotics  :   Anti-infectives    Start     Dose/Rate Route Frequency Ordered Stop   07/26/16 2030  vancomycin (VANCOCIN) IVPB 750 mg/150 ml premix  Status:  Discontinued     750 mg 150 mL/hr over 60 Minutes Intravenous Every 8 hours 07/25/16 2011 07/25/16 2040   07/25/16 2040  vancomycin (VANCOCIN) IVPB 750 mg/150 ml premix     750 mg 150 mL/hr over 60 Minutes Intravenous Every 8 hours 07/25/16 2040     07/18/16 1200  vancomycin (VANCOCIN) IVPB 750 mg/150 ml premix  Status:  Discontinued     750 mg 150 mL/hr over 60 Minutes Intravenous Every 8 hours 07/18/16 1002 07/25/16 2011   07/08/16 1515  vancomycin (VANCOCIN) IVPB 1000 mg/200 mL premix  Status:  Discontinued     1,000 mg 200 mL/hr over 60 Minutes Intravenous Every 8 hours 07/08/16 1501 07/18/16 0556   07/07/16 2200  piperacillin-tazobactam (ZOSYN) IVPB 3.375 g  Status:  Discontinued     3.375 g 12.5 mL/hr over 240 Minutes Intravenous Every 8 hours 07/07/16 1605 07/07/16 1754   07/07/16 1615  vancomycin (VANCOCIN) IVPB 750 mg/150  ml premix  Status:  Discontinued     750 mg 150 mL/hr over 60 Minutes Intravenous Every 8 hours 07/07/16 1605 07/08/16 1501   07/07/16 0813  piperacillin-tazobactam (ZOSYN) IVPB 3.375 g     3.375 g 100 mL/hr over 30 Minutes Intravenous  Once 07/07/16 0644 07/07/16 0839   07/07/16 0215  piperacillin-tazobactam (ZOSYN) IVPB 3.375 g     3.375 g 100 mL/hr over 30 Minutes Intravenous  Once 07/07/16 0213 07/07/16 0313   07/07/16 0215  vancomycin (VANCOCIN) IVPB 1000 mg/200 mL premix     1,000 mg 200 mL/hr over 60 Minutes Intravenous  Once 07/07/16 0213 07/07/16 0343        Objective:   Vitals:   07/25/16 1717 07/25/16 2247 07/26/16 0411 07/26/16 0917  BP: (!) 129/95 115/82 112/74 102/74  Pulse: 81 93 71 86  Resp: 18 19 19 18   Temp: 97.7 F (36.5 C) 98 F (36.7 C) 97.5 F (36.4 C) 97.8 F (36.6 C)  TempSrc: Oral Oral Oral Oral  SpO2: 98% 97% 100% 99%  Weight:      Height:        Wt Readings from Last 3 Encounters:  07/23/16 54.6 kg (120 lb 4.8 oz)     Intake/Output Summary (Last 24 hours) at 07/26/16 1229 Last data filed at 07/26/16 1039  Gross per 24 hour  Intake              600 ml  Output             1050 ml  Net             -450 ml     Physical Exam  Gen: alert and awake, in nad. Reports pain HEENT:  Pallor present, moist mucosa, supple neck Chest: CTA BL, no wheezes, equal chest rise. CVS:S1&S2 regular, no murmurs  GI: soft, ND , nontender Musculoskeletal: warm, no edema, does not let me examine her right arm and shoulder well CNS: Alert and oriented    Data Review:    CBC  Recent Labs Lab 07/23/16 0409  WBC 11.9*  HGB 9.0*  HCT 29.0*  PLT 318  MCV 85.5  MCH 26.5  MCHC 31.0  RDW 16.4*    Chemistries   Recent Labs Lab 07/19/16 1411 07/21/16 0452 07/23/16 0409 07/25/16 0425  NA  --   --  133* 134*  K  --   --  4.6 4.6  CL  --   --  97* 98*  CO2  --   --  28 27  GLUCOSE  --   --  104* 89  BUN  --   --  11 9  CREATININE 0.70 0.59  0.60 0.67  CALCIUM  --   --  8.8* 8.9   ------------------------------------------------------------------------------------------------------------------ No results for input(s): CHOL, HDL, LDLCALC, TRIG, CHOLHDL, LDLDIRECT in the last 72 hours.  No results found for: HGBA1C ------------------------------------------------------------------------------------------------------------------ No results for input(s): TSH, T4TOTAL, T3FREE, THYROIDAB in the last 72 hours.  Invalid input(s): FREET3 ------------------------------------------------------------------------------------------------------------------ No results for input(s): VITAMINB12, FOLATE, FERRITIN, TIBC, IRON, RETICCTPCT in the last 72 hours.  Coagulation profile No results for input(s): INR, PROTIME in the last 168 hours.  No results for input(s): DDIMER in the last 72 hours.  Cardiac Enzymes No results for input(s): CKMB, TROPONINI, MYOGLOBIN in the last 168 hours.  Invalid input(s): CK ------------------------------------------------------------------------------------------------------------------ No results found for: BNP  Inpatient Medications  Scheduled Meds: . cloNIDine  0.1 mg Oral BID  . enoxaparin (LOVENOX) injection  40  mg Subcutaneous Q24H  . feeding supplement (ENSURE ENLIVE)  237 mL Oral TID BM  . feeding supplement (PRO-STAT SUGAR FREE 64)  30 mL Oral BID  . ferrous sulfate  325 mg Oral BID WC  . folic acid  1 mg Oral Daily  . lidocaine  1 patch Transdermal Q24H  . meloxicam  15 mg Oral Daily  . multivitamin with minerals  1 tablet Oral Daily  . nicotine  21 mg Transdermal Daily  . sodium chloride flush  10-40 mL Intracatheter Q12H  . thiamine  100 mg Oral Daily  . triamcinolone ointment   Topical BID  . vancomycin  750 mg Intravenous Q8H   Continuous Infusions: . lactated ringers    . sodium chloride irrigation     PRN Meds:.acetaminophen **OR** acetaminophen, albuterol, hydrALAZINE,  HYDROmorphone (DILAUDID) injection, ondansetron **OR** ondansetron (ZOFRAN) IV, oxyCODONE, polyethylene glycol, sodium chloride flush, zolpidem  Micro Results Recent Results (from the past 240 hour(s))  Culture, body fluid-bottle     Status: None   Collection Time: 07/20/16 11:27 AM  Result Value Ref Range Status   Specimen Description PERITONEAL  Final   Special Requests NONE  Final   Culture NO GROWTH 5 DAYS  Final   Report Status 07/25/2016 FINAL  Final  Gram stain     Status: None   Collection Time: 07/20/16 11:27 AM  Result Value Ref Range Status   Specimen Description PERITONEAL  Final   Special Requests NONE  Final   Gram Stain   Final    FEW WBC PRESENT,BOTH PMN AND MONONUCLEAR NO ORGANISMS SEEN    Report Status 07/20/2016 FINAL  Final    Radiology Reports Dg Orthopantogram  Result Date: 07/20/2016 CLINICAL DATA:  Sepsis.  Tooth abscess. EXAM: ORTHOPANTOGRAM/PANORAMIC COMPARISON:  CT 02/18/2011. FINDINGS: No acute bony abnormality identified. No evidence of fracture or dislocation. Periapical lucencies noted throughout the remaining teeth along the mandible suggesting dental disease. No prominent lytic lesion noted. No fracture . If further evaluation is needed maxillofacial CT can be obtained. IMPRESSION: Muscle mild periapical lucencies are noted throughout the remaining teeth of the lower mandible. No prominent lytic lesion noted. Electronically Signed   By: Maisie Fus  Register   On: 07/20/2016 11:19   Dg Chest 1 View  Result Date: 07/20/2016 CLINICAL DATA:  Status post right thoracentesis. EXAM: CHEST 1 VIEW COMPARISON:  07/16/2016 FINDINGS: Right arm PICC line tip is in the projection of the cavoatrial junction. Normal heart size. Bilateral pleural effusions are identified, left greater than right. There is mild interstitial edema. Persistent bilateral mid lung and lower lobe opacities. IMPRESSION: 1. No change in bilateral pleural effusions and bilateral lung opacities.  Electronically Signed   By: Signa Kell M.D.   On: 07/20/2016 11:08   Dg Chest 2 View  Result Date: 07/21/2016 CLINICAL DATA:  Sepsis. EXAM: CHEST  2 VIEW COMPARISON:  07/20/2016. FINDINGS: Right PICC line stable position. Heart size stable. Multifocal bilateral pulmonary infiltrates and bilateral pleural effusions again noted. Pulmonary infiltrates have progressed slightly. IMPRESSION: 1.  Right PICC line stable position. 2. Progressive multifocal bilateral pulmonary infiltrates. Persistent bilateral pleural effusions. Electronically Signed   By: Maisie Fus  Register   On: 07/21/2016 12:03   Dg Chest 2 View  Result Date: 07/13/2016 CLINICAL DATA:  Pneumonia EXAM: CHEST  2 VIEW COMPARISON:  07/08/2016 FINDINGS: Bilateral pleural effusions, left greater than right with lower lobe airspace opacities, also worse on the left. This is improved somewhat on the right, not significantly changed  on the left. Right PICC line is in place with the tip at the cavoatrial junction. Low lung volumes. Heart is normal size. IMPRESSION: Bilateral lower lobe airspace opacities, left greater than right. Some improvement on the right since prior study with no change on the left. Small bilateral effusions. Electronically Signed   By: Charlett Nose M.D.   On: 07/13/2016 13:19   Dg Chest 2 View  Result Date: 07/07/2016 CLINICAL DATA:  Nausea vomiting and diarrhea for 8 days EXAM: CHEST  2 VIEW COMPARISON:  None. FINDINGS: Numerous ill-defined nodules throughout both lungs, many cavitary. No confluent consolidation. No large effusion. Hilar, mediastinal and cardiac contours are unremarkable. IMPRESSION: Innumerable nodules, many cavitary. Septic emboli are a leading consideration. Noninfectious inflammatory process or neoplastic process are less likely possibilities. Electronically Signed   By: Ellery Plunk M.D.   On: 07/07/2016 01:55   Dg Shoulder Right  Result Date: 07/18/2016 CLINICAL DATA:  Right shoulder pain with  fever and sepsis. EXAM: RIGHT SHOULDER - 2+ VIEW COMPARISON:  Chest x-ray 07/16/2016 FINDINGS: Right-sided PICC line unchanged. Bones, joint spaces and soft tissues over the right shoulder are within normal. Stable changes within the right lung. IMPRESSION: Unremarkable right shoulder. Electronically Signed   By: Elberta Fortis M.D.   On: 07/18/2016 10:49   Ct Chest W Contrast  Result Date: 07/19/2016 CLINICAL DATA:  Pneumonia, cough, fever, septic embolism, history hepatitis-C, asthma, IV drug abuse, followup EXAM: CT CHEST WITH CONTRAST TECHNIQUE: Multidetector CT imaging of the chest was performed during intravenous contrast administration. Sagittal and coronal MPR images reconstructed from axial data set. CONTRAST:  75 mL ISOVUE-300 IOPAMIDOL (ISOVUE-300) INJECTION 61% IV COMPARISON:  07/07/2016 FINDINGS: Cardiovascular: Vascular structures grossly patent on nondedicated exam. RIGHT arm PICC line with tip in SVC near cavoatrial junction. No pericardial effusion. Mediastinum/Nodes: Few scattered normal size mediastinal lymph nodes. No definite thoracic adenopathy. Esophagus unremarkable. Base of cervical region normal appearance. Lungs/Pleura: Multiple cavitary nodules are seen within both lungs consistent with septic emboli. Number of nodules has decreased since the previous exam, with some of the smaller nodule seen previously no longer identified. Remaining nodules are stable to decreased in sizes ; RIGHT upper lobe nodule measured previously at 21 mm now measures 16 mm image 32 and previously measured LEFT upper lobe nodule of 23 mm measures 16 mm at present. BILATERAL pleural effusions with compressive atelectasis of the lower lobes. No pneumothorax. Upper Abdomen: Beam hardening artifacts from patient's RIGHT arm. Spleen appears enlarged. No additional upper abdominal abnormalities seen. Musculoskeletal: No acute osseous findings. IMPRESSION: New BILATERAL pleural effusions with compressive atelectasis  of the lower lobes. Cavitary lung nodules bilaterally compatible with septic emboli, decreased in number with remaining nodules stable to decreased in sizes since the previous exam. Electronically Signed   By: Ulyses Southward M.D.   On: 07/19/2016 13:01   Ct Angio Chest Pe W And/or Wo Contrast  Result Date: 07/07/2016 CLINICAL DATA:  Short of breath fever, tachycardia. IV drug abuse. Leukocytosis. EXAM: CT ANGIOGRAPHY CHEST CT ABDOMEN AND PELVIS WITH CONTRAST TECHNIQUE: Multidetector CT imaging of the chest was performed using the standard protocol during bolus administration of intravenous contrast. Multiplanar CT image reconstructions and MIPs were obtained to evaluate the vascular anatomy. Multidetector CT imaging of the abdomen and pelvis was performed using the standard protocol during bolus administration of intravenous contrast. CONTRAST:  100 mL Isovue COMPARISON:  Chest radiograph 07/07/2016 FINDINGS: CTA CHEST FINDINGS Cardiovascular: No filling defects within the pulmonary to suggest acute  pulmonary embolism. No acute findings of the aorta great vessels. No pericardial effusion. Heart is grossly normal. Mediastinum/Nodes: No axillary or supraclavicular adenopathy. No mediastinal hilar adenopathy. Ill-defined peribronchial thickening in the hila. Lungs/Pleura: Bilateral thick-walled cavitary nodules in the upper and lower lobes. Consolidated airspace disease in the lower lobes. Approximately 30 nodules per lung. Example nodule cavitary nodule in the RIGHT upper lobe measures 21 mm (image 27, series 6). Example cavitary nodule in the LEFT upper lobe measures 23 mm on image 30 series 6. Musculoskeletal: No aggressive osseous lesion. Review of the MIP images confirms the above findings. CT ABDOMEN and PELVIS FINDINGS Hepatobiliary: Liver is enlarged. No focal hepatic lesion. Periportal edema noted. Portal veins are patent. Gallbladder wall is edematous and thickened to 6 mm. The gallbladder lumen is  contracted. Common bile duct normal caliber. Pancreas: No pancreatic inflammation or duct dilatation Spleen: Spleen is enlarged measuring 14.7 by 7.5 x 12.4 cm (volume = 720 cm^3). Low-density lesion in the spleen measuring 12 mm does not have simple fluid attenuation. Adrenals/Urinary Tract: Adrenal glands are normal. There is heterogeneous enhancement of the kidneys on the cortical phase imaging (image 32, series 2 for example). On the delayed pyelogram phase imaging the kidneys have a striated appearance (image 14 series 17). No renal obstruction. Bladder normal Stomach/Bowel: Stomach, small bowel, appendix, and cecum are normal. The colon and rectosigmoid colon are normal. Vascular/Lymphatic: Abdominal aorta is normal caliber. No abdominal lymphadenopathy. Reproductive: Uterus and ovaries grossly normal Other: Smaller free fluid in the posterior pelvis Musculoskeletal: No aggressive osseous lesion. Review of the MIP images confirms the above findings. IMPRESSION: Chest Impression: 1. Bilateral thick-walled cavitary nodules consistent with SEPTIC PULMONARY EMBOLI in this population (young patient with IV drug use). 2. Bibasilar consolidation consistent with pneumonia Abdomen / Pelvis Impression: 1. 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 2. Hepatosplenomegaly. Low-density lesion in the spleen is indeterminate. 3. Small volume of intraperitoneal free fluid and periportal edema may relate to hepatic dysfunction. Electronically Signed   By: Genevive Bi M.D.   On: 07/07/2016 09:29   Ct Abdomen Pelvis W Contrast  Result Date: 07/07/2016 CLINICAL DATA:  Short of breath fever, tachycardia. IV drug abuse. Leukocytosis. EXAM: CT ANGIOGRAPHY CHEST CT ABDOMEN AND PELVIS WITH CONTRAST TECHNIQUE: Multidetector CT imaging of the chest was performed using the standard protocol during bolus administration of intravenous contrast. Multiplanar CT image reconstructions and MIPs were  obtained to evaluate the vascular anatomy. Multidetector CT imaging of the abdomen and pelvis was performed using the standard protocol during bolus administration of intravenous contrast. CONTRAST:  100 mL Isovue COMPARISON:  Chest radiograph 07/07/2016 FINDINGS: CTA CHEST FINDINGS Cardiovascular: No filling defects within the pulmonary to suggest acute pulmonary embolism. No acute findings of the aorta great vessels. No pericardial effusion. Heart is grossly normal. Mediastinum/Nodes: No axillary or supraclavicular adenopathy. No mediastinal hilar adenopathy. Ill-defined peribronchial thickening in the hila. Lungs/Pleura: Bilateral thick-walled cavitary nodules in the upper and lower lobes. Consolidated airspace disease in the lower lobes. Approximately 30 nodules per lung. Example nodule cavitary nodule in the RIGHT upper lobe measures 21 mm (image 27, series 6). Example cavitary nodule in the LEFT upper lobe measures 23 mm on image 30 series 6. Musculoskeletal: No aggressive osseous lesion. Review of the MIP images confirms the above findings. CT ABDOMEN and PELVIS FINDINGS Hepatobiliary: Liver is enlarged. No focal hepatic lesion. Periportal edema noted. Portal veins are patent. Gallbladder wall is edematous and thickened to 6 mm. The gallbladder lumen  is contracted. Common bile duct normal caliber. Pancreas: No pancreatic inflammation or duct dilatation Spleen: Spleen is enlarged measuring 14.7 by 7.5 x 12.4 cm (volume = 720 cm^3). Low-density lesion in the spleen measuring 12 mm does not have simple fluid attenuation. Adrenals/Urinary Tract: Adrenal glands are normal. There is heterogeneous enhancement of the kidneys on the cortical phase imaging (image 32, series 2 for example). On the delayed pyelogram phase imaging the kidneys have a striated appearance (image 14 series 17). No renal obstruction. Bladder normal Stomach/Bowel: Stomach, small bowel, appendix, and cecum are normal. The colon and rectosigmoid  colon are normal. Vascular/Lymphatic: Abdominal aorta is normal caliber. No abdominal lymphadenopathy. Reproductive: Uterus and ovaries grossly normal Other: Smaller free fluid in the posterior pelvis Musculoskeletal: No aggressive osseous lesion. Review of the MIP images confirms the above findings. IMPRESSION: Chest Impression: 1. Bilateral thick-walled cavitary nodules consistent with SEPTIC PULMONARY EMBOLI in this population (young patient with IV drug use). 2. Bibasilar consolidation consistent with pneumonia Abdomen / Pelvis Impression: 1. 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 2. Hepatosplenomegaly. Low-density lesion in the spleen is indeterminate. 3. Small volume of intraperitoneal free fluid and periportal edema may relate to hepatic dysfunction. Electronically Signed   By: Genevive Bi M.D.   On: 07/07/2016 09:29   Dg Chest Port 1 View  Result Date: 07/16/2016 CLINICAL DATA:  MRSA bacteremia with septic pulmonary emboli. EXAM: PORTABLE CHEST 1 VIEW COMPARISON:  07/13/2016. FINDINGS: Right PICC tip at the superior cavoatrial junction. Normal sized heart. Mildly increased bilateral airspace opacity and bilateral pleural fluid. Unremarkable bones. IMPRESSION: Mildly increased bilateral pneumonia and pleural fluid. Electronically Signed   By: Beckie Salts M.D.   On: 07/16/2016 09:53   Dg Chest Port 1 View  Result Date: 07/08/2016 CLINICAL DATA:  Patient admitted yesterday with MR SA bacteremia. Possible aspiration today. EXAM: PORTABLE CHEST 1 VIEW COMPARISON:  CT chest and PA and lateral chest 07/07/2014. FINDINGS: Multiple nodular opacities bilaterally again seen. There is increased bibasilar airspace disease. No pneumothorax. Small pleural effusions noted. Heart size is normal. IMPRESSION: Increased bibasilar airspace disease could be due to atelectasis, aspiration and/or pneumonia. Multifocal nodular opacities compatible with septic emboli as seen on chest  CT yesterday. Electronically Signed   By: Drusilla Kanner M.D.   On: 07/08/2016 12:07   US Thoracentesis Asp Pleural Space W/img Guide  Result Date: 07/20/2016 INDICATION: Pneumonia, cough, fever, septic embolism, history of hepatitis-C, asthma, IV drug abuse, bilateral pleural effusions seen on CT scan. Request for diagnostic and therapeutic thoracentesis. EXAM: ULTRASOUND GUIDED RIGHT THORACENTESIS MEDICATIONS: 1% Lidocaine = 10 mL. COMPLICATIONS: None immediate. PROCEDURE: An ultrasound guided thoracentesis was thoroughly discussed with the patient and questions answered. The benefits, risks, alternatives and complications were also discussed. The patient understands and wishes to proceed with the procedure. Written consent was obtained. Ultrasound was performed to localize and mark an adequate pocket of fluid in the right chest. The effusion appeared loculated. The area was then prepped and draped in the normal sterile fashion. 1% Lidocaine was used for local anesthesia. Under ultrasound guidance a 6 Fr Safe-T-Centesis catheter was introduced. Thoracentesis was performed. The catheter was removed and a dressing applied. FINDINGS: A total of approximately 160 mL of blood tinged fluid was removed. Samples were sent to the laboratory as requested by the clinical team. IMPRESSION: Successful ultrasound guided right thoracentesis yielding only 160 mL of pleural fluid due to loculations. Read by:  Corrin Parker, PA-C Electronically Signed   By:  Richarda Overlie M.D.   On: 07/20/2016 10:38    Time Spent in minutes 35   Penny Pia M.D on 07/26/2016 at 12:29 PM  Between 7am to 7pm - Pager - [phone]  After 7pm go to [email or web address] - password [facility]  [facility] -  Office  [phone]

## 2016-07-27 MED ORDER — VANCOMYCIN HCL 10 G IV SOLR
1250.0000 mg | Freq: Two times a day (BID) | INTRAVENOUS | Status: DC
  Administered 2016-07-27 – 2016-07-29 (×4): 1250 mg via INTRAVENOUS
  Filled 2016-07-27 (×4): qty 1250

## 2016-07-27 NOTE — Progress Notes (Signed)
Pharmacy Antibiotic Note  [name] is a 36 y.o. 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 Patient continues on vancomycin.   The patient continues on Vancomycin with stable renal function, SCr 0.63 on 1/23. The patient has had several therapeutic Vanc troughs on 750 mg/8h - will adjust to a q12h regimen to help at discharge. Per ID - the patient is to continue antibiotics through 2/19.   Plan: 1. Adjust Vancomycin to 1250 mg IV every 12 hours 2. Will continue to follow renal function and will plan to repeat a Vancomycin trough on this new dose prior to discharge 3. F/U OPAT consult to pharmacy before discharge.    Height: 5\' 2"  (157.5 cm) Weight: 120 lb 4.8 oz (54.6 kg) IBW/kg (Calculated) : 50.1  Temp (24hrs), Avg:99.3 F (37.4 C), Min:97.3 F (36.3 C), Max:101.6 F (38.7 C)   Recent Labs Lab 07/21/16 0452 07/23/16 0409 07/23/16 0946 07/25/16 0425 07/25/16 1837  WBC  --  11.9*  --   --   --   CREATININE 0.59 0.60  --  0.67  --   VANCOTROUGH  --   --  18  --  8*    Estimated Creatinine Clearance: 76.2 mL/min (by C-G formula based on SCr of 0.67 mg/dL).    No Known Allergies  Antimicrobials this admission: Vanc 1/5 >> (2/19) Zosyn 1/5 >> 1/5  Dose adjustments this admission:  1/6 VT = 15 - on vanc 750 mg IV q 8 hrs 1/8 VT = 18 - on vanc 1g IV q 8 hrs 1/12 VT= 24 drawn from PICC line 1.5hr early - will leave dose alone for now 1/16 @ 0415 measured VT @30  mcg/ml (only 5 hours after evening dose) so corrects to ~24 mcg/ml >> will reduce to 750 mg/8h 1/18 VT 16 >> continue Vanc 750 mg/8h 1/21 VT 18 >> continue vanc 750mg /8h 1/23 VT 8 (AM dose got held for dental procedure)-not trough 1/25: Will adjust dose to q12h to help at discharge - estimated ke 0.0874, will try to check a new VT at ss prior to d/c  Microbiology results:  1/5 BCx: 2/2 MRSA 1/5 BCID - MRSA 1/5 UCx: neg 1/5 MRSA PCR: (+) 1/6 resp panel: neg 1/6 HCV Ab: 11, viral  load not calc- spontaneous clearing per ID 1/6 BCx: 1/2 MRSA 1/7 BCx:1/2 staph aureus 1/9 Bcx: neg 1/18 peritoneal fluid: ngtd  Thank you for allowing pharmacy to be a part of this patient's care.  Georgina PillionElizabeth Saba Colon, PharmD, BCPS Clinical Pharmacist Pager: [phone] Clinical phone for 07/27/2016 from 7a-3:30p: [phone] If after 3:30p, please call main pharmacy at: [phone] 07/27/2016 12:38 PM

## 2016-07-27 NOTE — Progress Notes (Signed)
PROGRESS NOTE                                                                                                                                                                                                             Patient Demographics:    Anna Colon, is a 36 y.o. female, DOB - February 20, 1981, ZOX:[id number]  Admit date - 07/07/2016   Admitting Physician Eduard Clos, MD  Outpatient Primary MD for the patient is No PCP Per Patient  LOS - 20  Outpatient Specialists:None  Chief Complaint  Patient presents with  . Vomiting       Brief Narrative   36 year old female with history of asthma, hep C, IV drug use presented to the ED with weakness, fatigue and cough of 2 week duration. Patient had a arrest warrant and was taken to [facility]. She was seen by a physician there who started on antibiotics for her illness. See was that out of the jail that she could go to the hospital and then presented to the ED. 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 Patient reported subjective fever with chills and nausea. She was tachycardic and tachypneic in the ED, afebrile. Chest x-ray concerning for septic emboli. CT angiogram of the chest showed bilateral cavitary nodules consistent with septic pulmonary emboli. Also showed findings concerning for bilateral pyelonephritis. Sepsis pathway initiated and admitted to stepdown unit. Blood cultures growing MRSA.  Pt also had dental abscesses and underwent procedure by dentist. Currently awaiting placement into facility. Discussed with social work    Subjective:   Reports that shoulder discomfort improving.   Assessment  & Plan :    Principal Problem:   MRSA bacteremia With tricuspid valve endocarditis, septic pulmonary emboli and possible septic renal emboli Secondary to IV drug use. Sepsis was improving but again became febrile. Chest x-ray  showing worsening pneumonia. Check CT of the chest with contrast. -empiric IV vancomycin.  PICC line placed for poor IV access. Repeat blood culture negative For growth. Appreciate ID consult.  - Plan is for antibiotic treatment through February 19th Continue telemetry monitoring.  Active Problems:   Sepsis (HCC) Again having fever spikes. Follow CT of the chest reports pleural effusions but no growth on evaluation of pleural fluid. Pt will need antibiotics through February 19th - needs  placement. - Pt had teeth pulled given dental abscesses, pain control a problem due to history of opiod abuse. - continue at current Oxy IR dose  Pleural effusions - Consulted CT surgery plan is for thoracentesis and based on results there are further recommendations. Thoracentesis results pending.  Shortness of breath with pleural effusion and symptomatic anemia Received IV Lasix and 1 unit PRBC. Improved after thoracentesis  Generalized pain Possibly due to septic emboli and? Withdrawal from narcotics. Discontinued NSAIDs given anemia.Marland Kitchen  on oxycodone IR and low-dose Dilaudid. Added clonidine with good response.  Right shoulder pain Reports ongoing since admission (says the police may have dislocated her shoulder when they handcuffed her). Order Lidoderm patch. X-ray of the shoulder unremarkable. Pt reports pain over trapezius, deltoid (posterior) bicep, tricep, forearm. Exam really inconsistent with story. I suspect there may be a psych component to this.  - improving with meloxicam use.  IVDU (intravenous drug user) Patient plans to quit. Father concerned about visitors coming to meet her and thinks that they will encourage her to use drugs.   Tobacco abuse Counseled on cessation. nicotine patch.    ETOH abuse No signs of withdrawal. Monitor on CIWA. Added low dose clonidine    Thrombocytopenia (HCC) On admission secondary to sepsis. Resolved.    Endocarditis of tricuspid valve Continue IV  antibiotic. Duration per ID. No signs of acute heart failure.  Iron deficiency anemia Added supplement   Hypophosphatemia Replenished. Check phosphorus in the morning.  Hepatitis C Secondary to IV drug use. Viral load negative. Suggests  spontaneous clearing  Sacral pressure ulcer Wound care consulted   Code Status : Full code  Family Communication  : father at bedside.  Disposition Plan  :  Currently inpatient. Father drives a truck and usually out of home. He is worried that if he takes her home she will be by herself and will again have her boyfriend and other people coming at home and she will be back on drugs. Reports that this has happened before. He is hoping if she will qualify to go to skilled nursing facility. Social work following. Plan on discharge if patient remains afebrile.  Barriers For Discharge : Active symptoms  Consults  : Infectious disease  Procedures  :  CT angiogram of the chest CT abdomen and pelvis 2-D echo  DVT Prophylaxis  :  Lovenox   Lab Results  Component Value Date   PLT 318 07/23/2016    Antibiotics  :   Anti-infectives    Start     Dose/Rate Route Frequency Ordered Stop   07/27/16 2100  vancomycin (VANCOCIN) 1,250 mg in sodium chloride 0.9 % 250 mL IVPB     1,250 mg 166.7 mL/hr over 90 Minutes Intravenous Every 12 hours 07/27/16 1235     07/26/16 2030  vancomycin (VANCOCIN) IVPB 750 mg/150 ml premix  Status:  Discontinued     750 mg 150 mL/hr over 60 Minutes Intravenous Every 8 hours 07/25/16 2011 07/25/16 2040   07/25/16 2040  vancomycin (VANCOCIN) IVPB 750 mg/150 ml premix  Status:  Discontinued     750 mg 150 mL/hr over 60 Minutes Intravenous Every 8 hours 07/25/16 2040 07/27/16 1235   07/18/16 1200  vancomycin (VANCOCIN) IVPB 750 mg/150 ml premix  Status:  Discontinued     750 mg 150 mL/hr over 60 Minutes Intravenous Every 8 hours 07/18/16 1002 07/25/16 2011   07/08/16 1515  vancomycin (VANCOCIN) IVPB 1000 mg/200 mL premix   Status:  Discontinued  1,000 mg 200 mL/hr over 60 Minutes Intravenous Every 8 hours 07/08/16 1501 07/18/16 0556   07/07/16 2200  piperacillin-tazobactam (ZOSYN) IVPB 3.375 g  Status:  Discontinued     3.375 g 12.5 mL/hr over 240 Minutes Intravenous Every 8 hours 07/07/16 1605 07/07/16 1754   07/07/16 1615  vancomycin (VANCOCIN) IVPB 750 mg/150 ml premix  Status:  Discontinued     750 mg 150 mL/hr over 60 Minutes Intravenous Every 8 hours 07/07/16 1605 07/08/16 1501   07/07/16 0813  piperacillin-tazobactam (ZOSYN) IVPB 3.375 g     3.375 g 100 mL/hr over 30 Minutes Intravenous  Once 07/07/16 0644 07/07/16 0839   07/07/16 0215  piperacillin-tazobactam (ZOSYN) IVPB 3.375 g     3.375 g 100 mL/hr over 30 Minutes Intravenous  Once 07/07/16 0213 07/07/16 0313   07/07/16 0215  vancomycin (VANCOCIN) IVPB 1000 mg/200 mL premix     1,000 mg 200 mL/hr over 60 Minutes Intravenous  Once 07/07/16 0213 07/07/16 0343        Objective:   Vitals:   07/26/16 1700 07/26/16 2025 07/27/16 0425 07/27/16 0945  BP: 116/77 133/86 120/89 124/88  Pulse: 81 64 90 (!) 110  Resp: 18 20 17 18   Temp: 97.3 F (36.3 C) 98.1 F (36.7 C) 100.2 F (37.9 C) (!) 101.6 F (38.7 C)  TempSrc: Oral   Oral  SpO2: 99% 93% 97% 98%  Weight:      Height:        Wt Readings from Last 3 Encounters:  07/23/16 54.6 kg (120 lb 4.8 oz)     Intake/Output Summary (Last 24 hours) at 07/27/16 1549 Last data filed at 07/27/16 1400  Gross per 24 hour  Intake             1830 ml  Output             1825 ml  Net                5 ml     Physical Exam  Gen: alert and awake, in nad. Reports pain HEENT:  Pallor present, moist mucosa, supple neck Chest: CTA BL, no wheezes, equal chest rise. CVS:S1&S2 regular, no murmurs  GI: soft, ND , nontender Musculoskeletal: warm, no edema, does not let me examine her right arm and shoulder well CNS: Alert and oriented    Data Review:    CBC  Recent Labs Lab 07/23/16 0409    WBC 11.9*  HGB 9.0*  HCT 29.0*  PLT 318  MCV 85.5  MCH 26.5  MCHC 31.0  RDW 16.4*    Chemistries   Recent Labs Lab 07/21/16 0452 07/23/16 0409 07/25/16 0425  NA  --  133* 134*  K  --  4.6 4.6  CL  --  97* 98*  CO2  --  28 27  GLUCOSE  --  104* 89  BUN  --  11 9  CREATININE 0.59 0.60 0.67  CALCIUM  --  8.8* 8.9   ------------------------------------------------------------------------------------------------------------------ No results for input(s): CHOL, HDL, LDLCALC, TRIG, CHOLHDL, LDLDIRECT in the last 72 hours.  No results found for: HGBA1C ------------------------------------------------------------------------------------------------------------------ No results for input(s): TSH, T4TOTAL, T3FREE, THYROIDAB in the last 72 hours.  Invalid input(s): FREET3 ------------------------------------------------------------------------------------------------------------------ No results for input(s): VITAMINB12, FOLATE, FERRITIN, TIBC, IRON, RETICCTPCT in the last 72 hours.  Coagulation profile No results for input(s): INR, PROTIME in the last 168 hours.  No results for input(s): DDIMER in the last 72 hours.  Cardiac Enzymes No  results for input(s): CKMB, TROPONINI, MYOGLOBIN in the last 168 hours.  Invalid input(s): CK ------------------------------------------------------------------------------------------------------------------ No results found for: BNP  Inpatient Medications  Scheduled Meds: . cloNIDine  0.1 mg Oral BID  . enoxaparin (LOVENOX) injection  40 mg Subcutaneous Q24H  . feeding supplement (ENSURE ENLIVE)  237 mL Oral TID BM  . feeding supplement (PRO-STAT SUGAR FREE 64)  30 mL Oral BID  . ferrous sulfate  325 mg Oral BID WC  . folic acid  1 mg Oral Daily  . lidocaine  1 patch Transdermal Q24H  . meloxicam  15 mg Oral Daily  . multivitamin with minerals  1 tablet Oral Daily  . nicotine  21 mg Transdermal Daily  . sodium chloride flush   10-40 mL Intracatheter Q12H  . thiamine  100 mg Oral Daily  . triamcinolone ointment   Topical BID  . vancomycin  1,250 mg Intravenous Q12H   Continuous Infusions: . lactated ringers    . sodium chloride irrigation     PRN Meds:.acetaminophen **OR** acetaminophen, albuterol, hydrALAZINE, HYDROmorphone (DILAUDID) injection, ondansetron **OR** ondansetron (ZOFRAN) IV, oxyCODONE, polyethylene glycol, sodium chloride flush, zolpidem  Micro Results Recent Results (from the past 240 hour(s))  Culture, body fluid-bottle     Status: None   Collection Time: 07/20/16 11:27 AM  Result Value Ref Range Status   Specimen Description PERITONEAL  Final   Special Requests NONE  Final   Culture NO GROWTH 5 DAYS  Final   Report Status 07/25/2016 FINAL  Final  Gram stain     Status: None   Collection Time: 07/20/16 11:27 AM  Result Value Ref Range Status   Specimen Description PERITONEAL  Final   Special Requests NONE  Final   Gram Stain   Final    FEW WBC PRESENT,BOTH PMN AND MONONUCLEAR NO ORGANISMS SEEN    Report Status 07/20/2016 FINAL  Final    Radiology Reports Dg Orthopantogram  Result Date: 07/20/2016 CLINICAL DATA:  Sepsis.  Tooth abscess. EXAM: ORTHOPANTOGRAM/PANORAMIC COMPARISON:  CT 02/18/2011. FINDINGS: No acute bony abnormality identified. No evidence of fracture or dislocation. Periapical lucencies noted throughout the remaining teeth along the mandible suggesting dental disease. No prominent lytic lesion noted. No fracture . If further evaluation is needed maxillofacial CT can be obtained. IMPRESSION: Muscle mild periapical lucencies are noted throughout the remaining teeth of the lower mandible. No prominent lytic lesion noted. Electronically Signed   By: Maisie Fus  Register   On: 07/20/2016 11:19   Dg Chest 1 View  Result Date: 07/20/2016 CLINICAL DATA:  Status post right thoracentesis. EXAM: CHEST 1 VIEW COMPARISON:  07/16/2016 FINDINGS: Right arm PICC line tip is in the projection  of the cavoatrial junction. Normal heart size. Bilateral pleural effusions are identified, left greater than right. There is mild interstitial edema. Persistent bilateral mid lung and lower lobe opacities. IMPRESSION: 1. No change in bilateral pleural effusions and bilateral lung opacities. Electronically Signed   By: Signa Kell M.D.   On: 07/20/2016 11:08   Dg Chest 2 View  Result Date: 07/21/2016 CLINICAL DATA:  Sepsis. EXAM: CHEST  2 VIEW COMPARISON:  07/20/2016. FINDINGS: Right PICC line stable position. Heart size stable. Multifocal bilateral pulmonary infiltrates and bilateral pleural effusions again noted. Pulmonary infiltrates have progressed slightly. IMPRESSION: 1.  Right PICC line stable position. 2. Progressive multifocal bilateral pulmonary infiltrates. Persistent bilateral pleural effusions. Electronically Signed   By: Maisie Fus  Register   On: 07/21/2016 12:03   Dg Chest 2 View  Result Date:  07/13/2016 CLINICAL DATA:  Pneumonia EXAM: CHEST  2 VIEW COMPARISON:  07/08/2016 FINDINGS: Bilateral pleural effusions, left greater than right with lower lobe airspace opacities, also worse on the left. This is improved somewhat on the right, not significantly changed on the left. Right PICC line is in place with the tip at the cavoatrial junction. Low lung volumes. Heart is normal size. IMPRESSION: Bilateral lower lobe airspace opacities, left greater than right. Some improvement on the right since prior study with no change on the left. Small bilateral effusions. Electronically Signed   By: Charlett Nose M.D.   On: 07/13/2016 13:19   Dg Chest 2 View  Result Date: 07/07/2016 CLINICAL DATA:  Nausea vomiting and diarrhea for 8 days EXAM: CHEST  2 VIEW COMPARISON:  None. FINDINGS: Numerous ill-defined nodules throughout both lungs, many cavitary. No confluent consolidation. No large effusion. Hilar, mediastinal and cardiac contours are unremarkable. IMPRESSION: Innumerable nodules, many cavitary. Septic  emboli are a leading consideration. Noninfectious inflammatory process or neoplastic process are less likely possibilities. Electronically Signed   By: Ellery Plunk M.D.   On: 07/07/2016 01:55   Dg Shoulder Right  Result Date: 07/18/2016 CLINICAL DATA:  Right shoulder pain with fever and sepsis. EXAM: RIGHT SHOULDER - 2+ VIEW COMPARISON:  Chest x-ray 07/16/2016 FINDINGS: Right-sided PICC line unchanged. Bones, joint spaces and soft tissues over the right shoulder are within normal. Stable changes within the right lung. IMPRESSION: Unremarkable right shoulder. Electronically Signed   By: Elberta Fortis M.D.   On: 07/18/2016 10:49   Ct Chest W Contrast  Result Date: 07/19/2016 CLINICAL DATA:  Pneumonia, cough, fever, septic embolism, history hepatitis-C, asthma, IV drug abuse, followup EXAM: CT CHEST WITH CONTRAST TECHNIQUE: Multidetector CT imaging of the chest was performed during intravenous contrast administration. Sagittal and coronal MPR images reconstructed from axial data set. CONTRAST:  75 mL ISOVUE-300 IOPAMIDOL (ISOVUE-300) INJECTION 61% IV COMPARISON:  07/07/2016 FINDINGS: Cardiovascular: Vascular structures grossly patent on nondedicated exam. RIGHT arm PICC line with tip in SVC near cavoatrial junction. No pericardial effusion. Mediastinum/Nodes: Few scattered normal size mediastinal lymph nodes. No definite thoracic adenopathy. Esophagus unremarkable. Base of cervical region normal appearance. Lungs/Pleura: Multiple cavitary nodules are seen within both lungs consistent with septic emboli. Number of nodules has decreased since the previous exam, with some of the smaller nodule seen previously no longer identified. Remaining nodules are stable to decreased in sizes ; RIGHT upper lobe nodule measured previously at 21 mm now measures 16 mm image 32 and previously measured LEFT upper lobe nodule of 23 mm measures 16 mm at present. BILATERAL pleural effusions with compressive atelectasis of the  lower lobes. No pneumothorax. Upper Abdomen: Beam hardening artifacts from patient's RIGHT arm. Spleen appears enlarged. No additional upper abdominal abnormalities seen. Musculoskeletal: No acute osseous findings. IMPRESSION: New BILATERAL pleural effusions with compressive atelectasis of the lower lobes. Cavitary lung nodules bilaterally compatible with septic emboli, decreased in number with remaining nodules stable to decreased in sizes since the previous exam. Electronically Signed   By: Ulyses Southward M.D.   On: 07/19/2016 13:01   Ct Angio Chest Pe W And/or Wo Contrast  Result Date: 07/07/2016 CLINICAL DATA:  Short of breath fever, tachycardia. IV drug abuse. Leukocytosis. EXAM: CT ANGIOGRAPHY CHEST CT ABDOMEN AND PELVIS WITH CONTRAST TECHNIQUE: Multidetector CT imaging of the chest was performed using the standard protocol during bolus administration of intravenous contrast. Multiplanar CT image reconstructions and MIPs were obtained to evaluate the vascular anatomy. Multidetector CT imaging  of the abdomen and pelvis was performed using the standard protocol during bolus administration of intravenous contrast. CONTRAST:  100 mL Isovue COMPARISON:  Chest radiograph 07/07/2016 FINDINGS: CTA CHEST FINDINGS Cardiovascular: No filling defects within the pulmonary to suggest acute pulmonary embolism. No acute findings of the aorta great vessels. No pericardial effusion. Heart is grossly normal. Mediastinum/Nodes: No axillary or supraclavicular adenopathy. No mediastinal hilar adenopathy. Ill-defined peribronchial thickening in the hila. Lungs/Pleura: Bilateral thick-walled cavitary nodules in the upper and lower lobes. Consolidated airspace disease in the lower lobes. Approximately 30 nodules per lung. Example nodule cavitary nodule in the RIGHT upper lobe measures 21 mm (image 27, series 6). Example cavitary nodule in the LEFT upper lobe measures 23 mm on image 30 series 6. Musculoskeletal: No aggressive osseous  lesion. Review of the MIP images confirms the above findings. CT ABDOMEN and PELVIS FINDINGS Hepatobiliary: Liver is enlarged. No focal hepatic lesion. Periportal edema noted. Portal veins are patent. Gallbladder wall is edematous and thickened to 6 mm. The gallbladder lumen is contracted. Common bile duct normal caliber. Pancreas: No pancreatic inflammation or duct dilatation Spleen: Spleen is enlarged measuring 14.7 by 7.5 x 12.4 cm (volume = 720 cm^3). Low-density lesion in the spleen measuring 12 mm does not have simple fluid attenuation. Adrenals/Urinary Tract: Adrenal glands are normal. There is heterogeneous enhancement of the kidneys on the cortical phase imaging (image 32, series 2 for example). On the delayed pyelogram phase imaging the kidneys have a striated appearance (image 14 series 17). No renal obstruction. Bladder normal Stomach/Bowel: Stomach, small bowel, appendix, and cecum are normal. The colon and rectosigmoid colon are normal. Vascular/Lymphatic: Abdominal aorta is normal caliber. No abdominal lymphadenopathy. Reproductive: Uterus and ovaries grossly normal Other: Smaller free fluid in the posterior pelvis Musculoskeletal: No aggressive osseous lesion. Review of the MIP images confirms the above findings. IMPRESSION: Chest Impression: 1. Bilateral thick-walled cavitary nodules consistent with SEPTIC PULMONARY EMBOLI in this population (young patient with IV drug use). 2. Bibasilar consolidation consistent with pneumonia Abdomen / Pelvis Impression: 1. 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 2. Hepatosplenomegaly. Low-density lesion in the spleen is indeterminate. 3. Small volume of intraperitoneal free fluid and periportal edema may relate to hepatic dysfunction. Electronically Signed   By: Genevive Bi M.D.   On: 07/07/2016 09:29   Ct Abdomen Pelvis W Contrast  Result Date: 07/07/2016 CLINICAL DATA:  Short of breath fever, tachycardia. IV drug  abuse. Leukocytosis. EXAM: CT ANGIOGRAPHY CHEST CT ABDOMEN AND PELVIS WITH CONTRAST TECHNIQUE: Multidetector CT imaging of the chest was performed using the standard protocol during bolus administration of intravenous contrast. Multiplanar CT image reconstructions and MIPs were obtained to evaluate the vascular anatomy. Multidetector CT imaging of the abdomen and pelvis was performed using the standard protocol during bolus administration of intravenous contrast. CONTRAST:  100 mL Isovue COMPARISON:  Chest radiograph 07/07/2016 FINDINGS: CTA CHEST FINDINGS Cardiovascular: No filling defects within the pulmonary to suggest acute pulmonary embolism. No acute findings of the aorta great vessels. No pericardial effusion. Heart is grossly normal. Mediastinum/Nodes: No axillary or supraclavicular adenopathy. No mediastinal hilar adenopathy. Ill-defined peribronchial thickening in the hila. Lungs/Pleura: Bilateral thick-walled cavitary nodules in the upper and lower lobes. Consolidated airspace disease in the lower lobes. Approximately 30 nodules per lung. Example nodule cavitary nodule in the RIGHT upper lobe measures 21 mm (image 27, series 6). Example cavitary nodule in the LEFT upper lobe measures 23 mm on image 30 series 6. Musculoskeletal: No aggressive osseous lesion.  Review of the MIP images confirms the above findings. CT ABDOMEN and PELVIS FINDINGS Hepatobiliary: Liver is enlarged. No focal hepatic lesion. Periportal edema noted. Portal veins are patent. Gallbladder wall is edematous and thickened to 6 mm. The gallbladder lumen is contracted. Common bile duct normal caliber. Pancreas: No pancreatic inflammation or duct dilatation Spleen: Spleen is enlarged measuring 14.7 by 7.5 x 12.4 cm (volume = 720 cm^3). Low-density lesion in the spleen measuring 12 mm does not have simple fluid attenuation. Adrenals/Urinary Tract: Adrenal glands are normal. There is heterogeneous enhancement of the kidneys on the cortical  phase imaging (image 32, series 2 for example). On the delayed pyelogram phase imaging the kidneys have a striated appearance (image 14 series 17). No renal obstruction. Bladder normal Stomach/Bowel: Stomach, small bowel, appendix, and cecum are normal. The colon and rectosigmoid colon are normal. Vascular/Lymphatic: Abdominal aorta is normal caliber. No abdominal lymphadenopathy. Reproductive: Uterus and ovaries grossly normal Other: Smaller free fluid in the posterior pelvis Musculoskeletal: No aggressive osseous lesion. Review of the MIP images confirms the above findings. IMPRESSION: Chest Impression: 1. Bilateral thick-walled cavitary nodules consistent with SEPTIC PULMONARY EMBOLI in this population (young patient with IV drug use). 2. Bibasilar consolidation consistent with pneumonia Abdomen / Pelvis Impression: 1. 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 2. Hepatosplenomegaly. Low-density lesion in the spleen is indeterminate. 3. Small volume of intraperitoneal free fluid and periportal edema may relate to hepatic dysfunction. Electronically Signed   By: Genevive Bi M.D.   On: 07/07/2016 09:29   Dg Chest Port 1 View  Result Date: 07/16/2016 CLINICAL DATA:  MRSA bacteremia with septic pulmonary emboli. EXAM: PORTABLE CHEST 1 VIEW COMPARISON:  07/13/2016. FINDINGS: Right PICC tip at the superior cavoatrial junction. Normal sized heart. Mildly increased bilateral airspace opacity and bilateral pleural fluid. Unremarkable bones. IMPRESSION: Mildly increased bilateral pneumonia and pleural fluid. Electronically Signed   By: Beckie Salts M.D.   On: 07/16/2016 09:53   Dg Chest Port 1 View  Result Date: 07/08/2016 CLINICAL DATA:  Patient admitted yesterday with MR SA bacteremia. Possible aspiration today. EXAM: PORTABLE CHEST 1 VIEW COMPARISON:  CT chest and PA and lateral chest 07/07/2014. FINDINGS: Multiple nodular opacities bilaterally again seen. There is increased  bibasilar airspace disease. No pneumothorax. Small pleural effusions noted. Heart size is normal. IMPRESSION: Increased bibasilar airspace disease could be due to atelectasis, aspiration and/or pneumonia. Multifocal nodular opacities compatible with septic emboli as seen on chest CT yesterday. Electronically Signed   By: Drusilla Kanner M.D.   On: 07/08/2016 12:07   US Thoracentesis Asp Pleural Space W/img Guide  Result Date: 07/20/2016 INDICATION: Pneumonia, cough, fever, septic embolism, history of hepatitis-C, asthma, IV drug abuse, bilateral pleural effusions seen on CT scan. Request for diagnostic and therapeutic thoracentesis. EXAM: ULTRASOUND GUIDED RIGHT THORACENTESIS MEDICATIONS: 1% Lidocaine = 10 mL. COMPLICATIONS: None immediate. PROCEDURE: An ultrasound guided thoracentesis was thoroughly discussed with the patient and questions answered. The benefits, risks, alternatives and complications were also discussed. The patient understands and wishes to proceed with the procedure. Written consent was obtained. Ultrasound was performed to localize and mark an adequate pocket of fluid in the right chest. The effusion appeared loculated. The area was then prepped and draped in the normal sterile fashion. 1% Lidocaine was used for local anesthesia. Under ultrasound guidance a 6 Fr Safe-T-Centesis catheter was introduced. Thoracentesis was performed. The catheter was removed and a dressing applied. FINDINGS: A total of approximately 160 mL of blood tinged fluid  was removed. Samples were sent to the laboratory as requested by the clinical team. IMPRESSION: Successful ultrasound guided right thoracentesis yielding only 160 mL of pleural fluid due to loculations. Read by:  Corrin Parker, PA-C Electronically Signed   By: Richarda Overlie M.D.   On: 07/20/2016 10:38    Time Spent in minutes 35   Penny Pia M.D on 07/27/2016 at 3:49 PM  Between 7am to 7pm - Pager - [phone]  After 7pm go to [email or web address] -  password [facility]  [facility] -  Office  [phone]

## 2016-07-28 LAB — BASIC METABOLIC PANEL
Anion gap: 8 (ref 5–15)
BUN: 13 mg/dL (ref 6–20)
CO2: 29 mmol/L (ref 22–32)
Calcium: 9.2 mg/dL (ref 8.9–10.3)
Chloride: 96 mmol/L — ABNORMAL LOW (ref 101–111)
Creatinine, Ser: 0.75 mg/dL (ref 0.44–1.00)
GFR calc Af Amer: >60 mL/min (ref >60)
GLUCOSE: 100 mg/dL — AB (ref 65–99)
POTASSIUM: 5.1 mmol/L (ref 3.5–5.1)
Sodium: 133 mmol/L — ABNORMAL LOW (ref 135–145)

## 2016-07-28 MED ORDER — HYDROMORPHONE HCL 2 MG PO TABS
4.0000 mg | ORAL_TABLET | ORAL | Status: DC | PRN
  Administered 2016-07-28 – 2016-07-29 (×6): 4 mg via ORAL
  Filled 2016-07-28 (×6): qty 2

## 2016-07-28 NOTE — Clinical Social Work Note (Signed)
CSW consulted with CSW leadership and will be contacting suggested facilities regarding bed availability for patient.  Genelle BalVanessa Admir Candelas, MSW, LCSW Licensed Clinical Social Worker Clinical Social Work Department [facility] [phone]

## 2016-07-28 NOTE — Progress Notes (Signed)
Pharmacy: Dilaudid IV Conversion to Po Dilaudid  Pharmacy has been consulted to covert the patient from IV to po Dilaudid.  The patient is currently receiving Dilaudid 1.5 mg IV every 6 hours. This converts to a total daily dose of Dilaudid 30 mg divided every 3-4 hours >> will round to the nearest tablet size and allow MD titration as needed  Plan - D/c IV Dilaudid - Start Dilaudid 4 mg po every 4 hours prn severe pain - Will allow MD titration as needed for pain management.   Thank you for allowing pharmacy to be a part of this patient's care.  Shey Yott, PharmD, BGeorgina PillionPS Clinical Pharmacist Pager: [phone] Clinical phone for 07/28/2016 from 7a-3:30p: [phone] If after 3:30p, please call main pharmacy at: [phone] 07/28/2016 10:15 AM

## 2016-07-28 NOTE — Progress Notes (Signed)
NP on call made aware of Temp of 102. Tylenol 650 mg po given. Encourage to drink fluids.

## 2016-07-28 NOTE — Progress Notes (Signed)
PROGRESS NOTE                                                                                                                                                                                                             Patient Demographics:    Anna Colon, is a 36 y.o. female, DOB - 1980-10-30, ZOX:[id number]  Admit date - 07/07/2016   Admitting Physician Eduard Clos, MD  Outpatient Primary MD for the patient is No PCP Per Patient  LOS - 21  Outpatient Specialists:None  Chief Complaint  Patient presents with  . Vomiting       Brief Narrative   36 year old female with history of asthma, hep C, IV drug use presented to the ED with weakness, fatigue and cough of 2 week duration. Patient had a arrest warrant and was taken to [place] jail. She was seen by a physician there who started on antibiotics for her illness. See was that out of the jail that she could go to the hospital and then presented to the ED. 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 Patient reported subjective fever with chills and nausea. She was tachycardic and tachypneic in the ED, afebrile. Chest x-ray concerning for septic emboli. CT angiogram of the chest showed bilateral cavitary nodules consistent with septic pulmonary emboli. Also showed findings concerning for bilateral pyelonephritis. Sepsis pathway initiated and admitted to stepdown unit. Blood cultures growing MRSA.  Pt also had dental abscesses and underwent procedure by dentist.   Currently awaiting placement into facility. Discussed with social work    Subjective:   Reports that shoulder discomfort improving.   Assessment  & Plan :    Principal Problem:   MRSA bacteremia With tricuspid valve endocarditis, septic pulmonary emboli and possible septic renal emboli Secondary to IV drug use. Sepsis was improving but again became febrile. Chest x-ray  showing worsening pneumonia. Check CT of the chest with contrast. -empiric IV vancomycin.  PICC line placed for poor IV access. Repeat blood culture negative For growth. Appreciate ID consult.  - Plan is for antibiotic treatment through February 19th Continue telemetry monitoring.  Active Problems:   Sepsis (HCC) Again having fever spikes. Follow CT of the chest reports pleural effusions but no growth on evaluation of pleural fluid. Pt will need antibiotics through February 19th -  needs placement. - Pt had teeth pulled given dental abscesses, pain control a problem due to history of opiod abuse. - Transition to oral opiod pain medication regimen. Discussed with patient. Placed pharmacy consult.  Pleural effusions - Consulted CT surgery plan is for thoracentesis and based on results there are further recommendations. Thoracentesis results pending.  Shortness of breath with pleural effusion and symptomatic anemia Received IV Lasix and 1 unit PRBC. Improved after thoracentesis  Generalized pain  Added clonidine with good response. - Transition to oral pain medication regimen today.  Right shoulder pain Reports ongoing since admission (says the police may have dislocated her shoulder when they handcuffed her). Order Lidoderm patch. X-ray of the shoulder unremarkable. Pt reports pain over trapezius, deltoid (posterior) bicep, tricep, forearm. Exam really inconsistent with story. I suspect there may be a psych component to this.  - improving with meloxicam use.  IVDU (intravenous drug user) Patient plans to quit. Father concerned about visitors coming to meet her and thinks that they will encourage her to use drugs.   Tobacco abuse Counseled on cessation. nicotine patch.    ETOH abuse No signs of withdrawal. Monitor on CIWA. Added low dose clonidine    Thrombocytopenia (HCC) On admission secondary to sepsis. Resolved.    Endocarditis of tricuspid valve Continue IV antibiotic.  Duration per ID. No signs of acute heart failure.  Iron deficiency anemia Added supplement  Hypophosphatemia Replenished. Check phosphorus in the morning.  Hepatitis C Secondary to IV drug use. Viral load negative. Suggests  spontaneous clearing  Sacral pressure ulcer Wound care consulted   Code Status : Full code  Family Communication  : father at bedside.  Disposition Plan  :  Currently inpatient. Father drives a truck and usually out of home. He is worried that if he takes her home she will be by herself and will again have her boyfriend and other people coming at home and she will be back on drugs. Reports that this has happened before. He is hoping if she will qualify to go to skilled nursing facility. Social work following. Plan on discharge if patient remains afebrile.  Barriers For Discharge : Active symptoms  Consults  : Infectious disease  Procedures  :  CT angiogram of the chest CT abdomen and pelvis 2-D echo  DVT Prophylaxis  :  Lovenox   Lab Results  Component Value Date   PLT 318 07/23/2016    Antibiotics  :   Anti-infectives    Start     Dose/Rate Route Frequency Ordered Stop   07/27/16 2100  vancomycin (VANCOCIN) 1,250 mg in sodium chloride 0.9 % 250 mL IVPB     1,250 mg 166.7 mL/hr over 90 Minutes Intravenous Every 12 hours 07/27/16 1235     07/26/16 2030  vancomycin (VANCOCIN) IVPB 750 mg/150 ml premix  Status:  Discontinued     750 mg 150 mL/hr over 60 Minutes Intravenous Every 8 hours 07/25/16 2011 07/25/16 2040   07/25/16 2040  vancomycin (VANCOCIN) IVPB 750 mg/150 ml premix  Status:  Discontinued     750 mg 150 mL/hr over 60 Minutes Intravenous Every 8 hours 07/25/16 2040 07/27/16 1235   07/18/16 1200  vancomycin (VANCOCIN) IVPB 750 mg/150 ml premix  Status:  Discontinued     750 mg 150 mL/hr over 60 Minutes Intravenous Every 8 hours 07/18/16 1002 07/25/16 2011   07/08/16 1515  vancomycin (VANCOCIN) IVPB 1000 mg/200 mL premix  Status:   Discontinued     1,000 mg  200 mL/hr over 60 Minutes Intravenous Every 8 hours 07/08/16 1501 07/18/16 0556   07/07/16 2200  piperacillin-tazobactam (ZOSYN) IVPB 3.375 g  Status:  Discontinued     3.375 g 12.5 mL/hr over 240 Minutes Intravenous Every 8 hours 07/07/16 1605 07/07/16 1754   07/07/16 1615  vancomycin (VANCOCIN) IVPB 750 mg/150 ml premix  Status:  Discontinued     750 mg 150 mL/hr over 60 Minutes Intravenous Every 8 hours 07/07/16 1605 07/08/16 1501   07/07/16 0813  piperacillin-tazobactam (ZOSYN) IVPB 3.375 g     3.375 g 100 mL/hr over 30 Minutes Intravenous  Once 07/07/16 0644 07/07/16 0839   07/07/16 0215  piperacillin-tazobactam (ZOSYN) IVPB 3.375 g     3.375 g 100 mL/hr over 30 Minutes Intravenous  Once 07/07/16 0213 07/07/16 0313   07/07/16 0215  vancomycin (VANCOCIN) IVPB 1000 mg/200 mL premix     1,000 mg 200 mL/hr over 60 Minutes Intravenous  Once 07/07/16 0213 07/07/16 0343        Objective:   Vitals:   07/27/16 2103 07/28/16 0513 07/28/16 0709 07/28/16 0910  BP: 110/75 105/74  127/80  Pulse: 93 (!) 104  95  Resp: 18 18    Temp: 99.3 F (37.4 C) (!) 102 F (38.9 C) 99.5 F (37.5 C)   TempSrc:      SpO2: 99% 94%    Weight: 54 kg (119 lb 0.8 oz)     Height:        Wt Readings from Last 3 Encounters:  07/27/16 54 kg (119 lb 0.8 oz)     Intake/Output Summary (Last 24 hours) at 07/28/16 0916 Last data filed at 07/28/16 0600  Gross per 24 hour  Intake             1570 ml  Output             2125 ml  Net             -555 ml     Physical Exam  Gen: alert and awake, in nad. Reports pain HEENT:  Pallor present, moist mucosa, supple neck Chest: CTA BL, no wheezes, equal chest rise. CVS:S1&S2 regular, no murmurs  GI: soft, ND , nontender Musculoskeletal: warm, no edema, does not let me examine her right arm and shoulder well CNS: Alert and oriented    Data Review:    CBC  Recent Labs Lab 07/23/16 0409  WBC 11.9*  HGB 9.0*  HCT 29.0*    PLT 318  MCV 85.5  MCH 26.5  MCHC 31.0  RDW 16.4*    Chemistries   Recent Labs Lab 07/23/16 0409 07/25/16 0425 07/28/16 0452  NA 133* 134* 133*  K 4.6 4.6 5.1  CL 97* 98* 96*  CO2 28 27 29   GLUCOSE 104* 89 100*  BUN 11 9 13   CREATININE 0.60 0.67 0.75  CALCIUM 8.8* 8.9 9.2   ------------------------------------------------------------------------------------------------------------------ No results for input(s): CHOL, HDL, LDLCALC, TRIG, CHOLHDL, LDLDIRECT in the last 72 hours.  No results found for: HGBA1C ------------------------------------------------------------------------------------------------------------------ No results for input(s): TSH, T4TOTAL, T3FREE, THYROIDAB in the last 72 hours.  Invalid input(s): FREET3 ------------------------------------------------------------------------------------------------------------------ No results for input(s): VITAMINB12, FOLATE, FERRITIN, TIBC, IRON, RETICCTPCT in the last 72 hours.  Coagulation profile No results for input(s): INR, PROTIME in the last 168 hours.  No results for input(s): DDIMER in the last 72 hours.  Cardiac Enzymes No results for input(s): CKMB, TROPONINI, MYOGLOBIN in the last 168 hours.  Invalid input(s): CK ------------------------------------------------------------------------------------------------------------------ No  results found for: BNP  Inpatient Medications  Scheduled Meds: . cloNIDine  0.1 mg Oral BID  . enoxaparin (LOVENOX) injection  40 mg Subcutaneous Q24H  . feeding supplement (ENSURE ENLIVE)  237 mL Oral TID BM  . feeding supplement (PRO-STAT SUGAR FREE 64)  30 mL Oral BID  . ferrous sulfate  325 mg Oral BID WC  . folic acid  1 mg Oral Daily  . lidocaine  1 patch Transdermal Q24H  . meloxicam  15 mg Oral Daily  . multivitamin with minerals  1 tablet Oral Daily  . nicotine  21 mg Transdermal Daily  . sodium chloride flush  10-40 mL Intracatheter Q12H  . thiamine  100  mg Oral Daily  . triamcinolone ointment   Topical BID  . vancomycin  1,250 mg Intravenous Q12H   Continuous Infusions: . lactated ringers    . sodium chloride irrigation     PRN Meds:.acetaminophen **OR** acetaminophen, albuterol, hydrALAZINE, HYDROmorphone (DILAUDID) injection, ondansetron **OR** ondansetron (ZOFRAN) IV, oxyCODONE, polyethylene glycol, sodium chloride flush, zolpidem  Micro Results Recent Results (from the past 240 hour(s))  Culture, body fluid-bottle     Status: None   Collection Time: 07/20/16 11:27 AM  Result Value Ref Range Status   Specimen Description PERITONEAL  Final   Special Requests NONE  Final   Culture NO GROWTH 5 DAYS  Final   Report Status 07/25/2016 FINAL  Final  Gram stain     Status: None   Collection Time: 07/20/16 11:27 AM  Result Value Ref Range Status   Specimen Description PERITONEAL  Final   Special Requests NONE  Final   Gram Stain   Final    FEW WBC PRESENT,BOTH PMN AND MONONUCLEAR NO ORGANISMS SEEN    Report Status 07/20/2016 FINAL  Final    Radiology Reports Dg Orthopantogram  Result Date: 07/20/2016 CLINICAL DATA:  Sepsis.  Tooth abscess. EXAM: ORTHOPANTOGRAM/PANORAMIC COMPARISON:  CT 02/18/2011. FINDINGS: No acute bony abnormality identified. No evidence of fracture or dislocation. Periapical lucencies noted throughout the remaining teeth along the mandible suggesting dental disease. No prominent lytic lesion noted. No fracture . If further evaluation is needed maxillofacial CT can be obtained. IMPRESSION: Muscle mild periapical lucencies are noted throughout the remaining teeth of the lower mandible. No prominent lytic lesion noted. Electronically Signed   By: Maisie Fus  Register   On: 07/20/2016 11:19   Dg Chest 1 View  Result Date: 07/20/2016 CLINICAL DATA:  Status post right thoracentesis. EXAM: CHEST 1 VIEW COMPARISON:  07/16/2016 FINDINGS: Right arm PICC line tip is in the projection of the cavoatrial junction. Normal heart  size. Bilateral pleural effusions are identified, left greater than right. There is mild interstitial edema. Persistent bilateral mid lung and lower lobe opacities. IMPRESSION: 1. No change in bilateral pleural effusions and bilateral lung opacities. Electronically Signed   By: Signa Kell M.D.   On: 07/20/2016 11:08   Dg Chest 2 View  Result Date: 07/21/2016 CLINICAL DATA:  Sepsis. EXAM: CHEST  2 VIEW COMPARISON:  07/20/2016. FINDINGS: Right PICC line stable position. Heart size stable. Multifocal bilateral pulmonary infiltrates and bilateral pleural effusions again noted. Pulmonary infiltrates have progressed slightly. IMPRESSION: 1.  Right PICC line stable position. 2. Progressive multifocal bilateral pulmonary infiltrates. Persistent bilateral pleural effusions. Electronically Signed   By: Maisie Fus  Register   On: 07/21/2016 12:03   Dg Chest 2 View  Result Date: 07/13/2016 CLINICAL DATA:  Pneumonia EXAM: CHEST  2 VIEW COMPARISON:  07/08/2016 FINDINGS: Bilateral pleural effusions,  left greater than right with lower lobe airspace opacities, also worse on the left. This is improved somewhat on the right, not significantly changed on the left. Right PICC line is in place with the tip at the cavoatrial junction. Low lung volumes. Heart is normal size. IMPRESSION: Bilateral lower lobe airspace opacities, left greater than right. Some improvement on the right since prior study with no change on the left. Small bilateral effusions. Electronically Signed   By: Charlett Nose M.D.   On: 07/13/2016 13:19   Dg Chest 2 View  Result Date: 07/07/2016 CLINICAL DATA:  Nausea vomiting and diarrhea for 8 days EXAM: CHEST  2 VIEW COMPARISON:  None. FINDINGS: Numerous ill-defined nodules throughout both lungs, many cavitary. No confluent consolidation. No large effusion. Hilar, mediastinal and cardiac contours are unremarkable. IMPRESSION: Innumerable nodules, many cavitary. Septic emboli are a leading consideration.  Noninfectious inflammatory process or neoplastic process are less likely possibilities. Electronically Signed   By: Ellery Plunk M.D.   On: 07/07/2016 01:55   Dg Shoulder Right  Result Date: 07/18/2016 CLINICAL DATA:  Right shoulder pain with fever and sepsis. EXAM: RIGHT SHOULDER - 2+ VIEW COMPARISON:  Chest x-ray 07/16/2016 FINDINGS: Right-sided PICC line unchanged. Bones, joint spaces and soft tissues over the right shoulder are within normal. Stable changes within the right lung. IMPRESSION: Unremarkable right shoulder. Electronically Signed   By: Elberta Fortis M.D.   On: 07/18/2016 10:49   Ct Chest W Contrast  Result Date: 07/19/2016 CLINICAL DATA:  Pneumonia, cough, fever, septic embolism, history hepatitis-C, asthma, IV drug abuse, followup EXAM: CT CHEST WITH CONTRAST TECHNIQUE: Multidetector CT imaging of the chest was performed during intravenous contrast administration. Sagittal and coronal MPR images reconstructed from axial data set. CONTRAST:  75 mL ISOVUE-300 IOPAMIDOL (ISOVUE-300) INJECTION 61% IV COMPARISON:  07/07/2016 FINDINGS: Cardiovascular: Vascular structures grossly patent on nondedicated exam. RIGHT arm PICC line with tip in SVC near cavoatrial junction. No pericardial effusion. Mediastinum/Nodes: Few scattered normal size mediastinal lymph nodes. No definite thoracic adenopathy. Esophagus unremarkable. Base of cervical region normal appearance. Lungs/Pleura: Multiple cavitary nodules are seen within both lungs consistent with septic emboli. Number of nodules has decreased since the previous exam, with some of the smaller nodule seen previously no longer identified. Remaining nodules are stable to decreased in sizes ; RIGHT upper lobe nodule measured previously at 21 mm now measures 16 mm image 32 and previously measured LEFT upper lobe nodule of 23 mm measures 16 mm at present. BILATERAL pleural effusions with compressive atelectasis of the lower lobes. No pneumothorax. Upper  Abdomen: Beam hardening artifacts from patient's RIGHT arm. Spleen appears enlarged. No additional upper abdominal abnormalities seen. Musculoskeletal: No acute osseous findings. IMPRESSION: New BILATERAL pleural effusions with compressive atelectasis of the lower lobes. Cavitary lung nodules bilaterally compatible with septic emboli, decreased in number with remaining nodules stable to decreased in sizes since the previous exam. Electronically Signed   By: Ulyses Southward M.D.   On: 07/19/2016 13:01   Ct Angio Chest Pe W And/or Wo Contrast  Result Date: 07/07/2016 CLINICAL DATA:  Short of breath fever, tachycardia. IV drug abuse. Leukocytosis. EXAM: CT ANGIOGRAPHY CHEST CT ABDOMEN AND PELVIS WITH CONTRAST TECHNIQUE: Multidetector CT imaging of the chest was performed using the standard protocol during bolus administration of intravenous contrast. Multiplanar CT image reconstructions and MIPs were obtained to evaluate the vascular anatomy. Multidetector CT imaging of the abdomen and pelvis was performed using the standard protocol during bolus administration of intravenous contrast.  CONTRAST:  100 mL Isovue COMPARISON:  Chest radiograph 07/07/2016 FINDINGS: CTA CHEST FINDINGS Cardiovascular: No filling defects within the pulmonary to suggest acute pulmonary embolism. No acute findings of the aorta great vessels. No pericardial effusion. Heart is grossly normal. Mediastinum/Nodes: No axillary or supraclavicular adenopathy. No mediastinal hilar adenopathy. Ill-defined peribronchial thickening in the hila. Lungs/Pleura: Bilateral thick-walled cavitary nodules in the upper and lower lobes. Consolidated airspace disease in the lower lobes. Approximately 30 nodules per lung. Example nodule cavitary nodule in the RIGHT upper lobe measures 21 mm (image 27, series 6). Example cavitary nodule in the LEFT upper lobe measures 23 mm on image 30 series 6. Musculoskeletal: No aggressive osseous lesion. Review of the MIP images  confirms the above findings. CT ABDOMEN and PELVIS FINDINGS Hepatobiliary: Liver is enlarged. No focal hepatic lesion. Periportal edema noted. Portal veins are patent. Gallbladder wall is edematous and thickened to 6 mm. The gallbladder lumen is contracted. Common bile duct normal caliber. Pancreas: No pancreatic inflammation or duct dilatation Spleen: Spleen is enlarged measuring 14.7 by 7.5 x 12.4 cm (volume = 720 cm^3). Low-density lesion in the spleen measuring 12 mm does not have simple fluid attenuation. Adrenals/Urinary Tract: Adrenal glands are normal. There is heterogeneous enhancement of the kidneys on the cortical phase imaging (image 32, series 2 for example). On the delayed pyelogram phase imaging the kidneys have a striated appearance (image 14 series 17). No renal obstruction. Bladder normal Stomach/Bowel: Stomach, small bowel, appendix, and cecum are normal. The colon and rectosigmoid colon are normal. Vascular/Lymphatic: Abdominal aorta is normal caliber. No abdominal lymphadenopathy. Reproductive: Uterus and ovaries grossly normal Other: Smaller free fluid in the posterior pelvis Musculoskeletal: No aggressive osseous lesion. Review of the MIP images confirms the above findings. IMPRESSION: Chest Impression: 1. Bilateral thick-walled cavitary nodules consistent with SEPTIC PULMONARY EMBOLI in this population (young patient with IV drug use). 2. Bibasilar consolidation consistent with pneumonia Abdomen / Pelvis Impression: 1. 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 2. Hepatosplenomegaly. Low-density lesion in the spleen is indeterminate. 3. Small volume of intraperitoneal free fluid and periportal edema may relate to hepatic dysfunction. Electronically Signed   By: Genevive Bi M.D.   On: 07/07/2016 09:29   Ct Abdomen Pelvis W Contrast  Result Date: 07/07/2016 CLINICAL DATA:  Short of breath fever, tachycardia. IV drug abuse. Leukocytosis. EXAM: CT  ANGIOGRAPHY CHEST CT ABDOMEN AND PELVIS WITH CONTRAST TECHNIQUE: Multidetector CT imaging of the chest was performed using the standard protocol during bolus administration of intravenous contrast. Multiplanar CT image reconstructions and MIPs were obtained to evaluate the vascular anatomy. Multidetector CT imaging of the abdomen and pelvis was performed using the standard protocol during bolus administration of intravenous contrast. CONTRAST:  100 mL Isovue COMPARISON:  Chest radiograph 07/07/2016 FINDINGS: CTA CHEST FINDINGS Cardiovascular: No filling defects within the pulmonary to suggest acute pulmonary embolism. No acute findings of the aorta great vessels. No pericardial effusion. Heart is grossly normal. Mediastinum/Nodes: No axillary or supraclavicular adenopathy. No mediastinal hilar adenopathy. Ill-defined peribronchial thickening in the hila. Lungs/Pleura: Bilateral thick-walled cavitary nodules in the upper and lower lobes. Consolidated airspace disease in the lower lobes. Approximately 30 nodules per lung. Example nodule cavitary nodule in the RIGHT upper lobe measures 21 mm (image 27, series 6). Example cavitary nodule in the LEFT upper lobe measures 23 mm on image 30 series 6. Musculoskeletal: No aggressive osseous lesion. Review of the MIP images confirms the above findings. CT ABDOMEN and PELVIS FINDINGS Hepatobiliary: Liver is  enlarged. No focal hepatic lesion. Periportal edema noted. Portal veins are patent. Gallbladder wall is edematous and thickened to 6 mm. The gallbladder lumen is contracted. Common bile duct normal caliber. Pancreas: No pancreatic inflammation or duct dilatation Spleen: Spleen is enlarged measuring 14.7 by 7.5 x 12.4 cm (volume = 720 cm^3). Low-density lesion in the spleen measuring 12 mm does not have simple fluid attenuation. Adrenals/Urinary Tract: Adrenal glands are normal. There is heterogeneous enhancement of the kidneys on the cortical phase imaging (image 32, series  2 for example). On the delayed pyelogram phase imaging the kidneys have a striated appearance (image 14 series 17). No renal obstruction. Bladder normal Stomach/Bowel: Stomach, small bowel, appendix, and cecum are normal. The colon and rectosigmoid colon are normal. Vascular/Lymphatic: Abdominal aorta is normal caliber. No abdominal lymphadenopathy. Reproductive: Uterus and ovaries grossly normal Other: Smaller free fluid in the posterior pelvis Musculoskeletal: No aggressive osseous lesion. Review of the MIP images confirms the above findings. IMPRESSION: Chest Impression: 1. Bilateral thick-walled cavitary nodules consistent with SEPTIC PULMONARY EMBOLI in this population (young patient with IV drug use). 2. Bibasilar consolidation consistent with pneumonia Abdomen / Pelvis Impression: 1. 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 2. Hepatosplenomegaly. Low-density lesion in the spleen is indeterminate. 3. Small volume of intraperitoneal free fluid and periportal edema may relate to hepatic dysfunction. Electronically Signed   By: Genevive BiStewart  Edmunds M.D.   On: 07/07/2016 09:29   Dg Chest Port 1 View  Result Date: 07/16/2016 CLINICAL DATA:  MRSA bacteremia with septic pulmonary emboli. EXAM: PORTABLE CHEST 1 VIEW COMPARISON:  07/13/2016. FINDINGS: Right PICC tip at the superior cavoatrial junction. Normal sized heart. Mildly increased bilateral airspace opacity and bilateral pleural fluid. Unremarkable bones. IMPRESSION: Mildly increased bilateral pneumonia and pleural fluid. Electronically Signed   By: Beckie SaltsSteven  Reid M.D.   On: 07/16/2016 09:53   Dg Chest Port 1 View  Result Date: 07/08/2016 CLINICAL DATA:  Patient admitted yesterday with MR SA bacteremia. Possible aspiration today. EXAM: PORTABLE CHEST 1 VIEW COMPARISON:  CT chest and PA and lateral chest 07/07/2014. FINDINGS: Multiple nodular opacities bilaterally again seen. There is increased bibasilar airspace disease. No  pneumothorax. Small pleural effusions noted. Heart size is normal. IMPRESSION: Increased bibasilar airspace disease could be due to atelectasis, aspiration and/or pneumonia. Multifocal nodular opacities compatible with septic emboli as seen on chest CT yesterday. Electronically Signed   By: Drusilla Kannerhomas  Dalessio M.D.   On: 07/08/2016 12:07   [place] Thoracentesis Asp Pleural Space W/img Guide  Result Date: 07/20/2016 INDICATION: Pneumonia, cough, fever, septic embolism, history of hepatitis-C, asthma, IV drug abuse, bilateral pleural effusions seen on CT scan. Request for diagnostic and therapeutic thoracentesis. EXAM: ULTRASOUND GUIDED RIGHT THORACENTESIS MEDICATIONS: 1% Lidocaine = 10 mL. COMPLICATIONS: None immediate. PROCEDURE: An ultrasound guided thoracentesis was thoroughly discussed with the patient and questions answered. The benefits, risks, alternatives and complications were also discussed. The patient understands and wishes to proceed with the procedure. Written consent was obtained. Ultrasound was performed to localize and mark an adequate pocket of fluid in the right chest. The effusion appeared loculated. The area was then prepped and draped in the normal sterile fashion. 1% Lidocaine was used for local anesthesia. Under ultrasound guidance a 6 Fr Safe-T-Centesis catheter was introduced. Thoracentesis was performed. The catheter was removed and a dressing applied. FINDINGS: A total of approximately 160 mL of blood tinged fluid was removed. Samples were sent to the laboratory as requested by the clinical team. IMPRESSION: Successful ultrasound  guided right thoracentesis yielding only 160 mL of pleural fluid due to loculations. Read by:  Corrin Parker, PA-C Electronically Signed   By: Richarda Overlie M.D.   On: 07/20/2016 10:38    Time Spent in minutes 35   Penny Pia M.D on 07/28/2016 at 9:16 AM  Between 7am to 7pm - Pager - [phone]  After 7pm go to [email or web address] - password [facility]  [facility] -  Office  [phone]

## 2016-07-29 ENCOUNTER — Inpatient Hospital Stay (HOSPITAL_COMMUNITY): Payer: Self-pay

## 2016-07-29 LAB — CBC
HEMATOCRIT: 32.4 % — AB (ref 36.0–46.0)
HEMOGLOBIN: 9.8 g/dL — AB (ref 12.0–15.0)
MCH: 26.3 pg (ref 26.0–34.0)
MCHC: 30.2 g/dL (ref 30.0–36.0)
MCV: 87.1 fL (ref 78.0–100.0)
Platelets: 364 10*3/uL (ref 150–400)
RBC: 3.72 MIL/uL — AB (ref 3.87–5.11)
RDW: 17 % — ABNORMAL HIGH (ref 11.5–15.5)
WBC: 11.8 10*3/uL — AB (ref 4.0–10.5)

## 2016-07-29 LAB — VANCOMYCIN, TROUGH: Vancomycin Tr: 22 ug/mL (ref 15–20)

## 2016-07-29 MED ORDER — HYDROMORPHONE HCL 2 MG PO TABS
2.0000 mg | ORAL_TABLET | ORAL | Status: DC | PRN
  Administered 2016-07-29: 2 mg via ORAL
  Administered 2016-07-29: 4 mg via ORAL
  Administered 2016-07-30 – 2016-08-01 (×11): 2 mg via ORAL
  Filled 2016-07-29 (×13): qty 1

## 2016-07-29 MED ORDER — IOPAMIDOL (ISOVUE-300) INJECTION 61%
INTRAVENOUS | Status: AC
  Administered 2016-07-30: 75 mL
  Filled 2016-07-29: qty 75

## 2016-07-29 MED ORDER — VANCOMYCIN HCL IN DEXTROSE 1-5 GM/200ML-% IV SOLN
1000.0000 mg | Freq: Two times a day (BID) | INTRAVENOUS | Status: DC
  Administered 2016-07-29 – 2016-07-30 (×2): 1000 mg via INTRAVENOUS
  Filled 2016-07-29 (×2): qty 200

## 2016-07-29 NOTE — Progress Notes (Signed)
Pharmacy Antibiotic Note  Anna CaterConstance Mollett is a 36 y.o. 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 Patient continues on vancomycin.  SCr remains table at 0.75 today. The patient was at goal vanc troughs on q8h dosing but was changed over to q12h in anticipation of discharge with plan to continue abx through 2/19. This AM VT came back high at 22 following 1250mg  Q12H dosing. Tmax/24h: 101.2, WBC 11.8 on 1/27. The morning 1250mg  dose was already given prior to this note.  Plan: 1. Adjust Vancomycin to 1000 mg IV every 12 hours starting at 10 PM tonight 2. Will continue to follow renal function and will plan to repeat a Vancomycin trough on this new dose prior to discharge 3. F/U OPAT consult to pharmacy before discharge.    Height: 5\' 2"  (157.5 cm) Weight: 119 lb 0.8 oz (54 kg) IBW/kg (Calculated) : 50.1  Temp (24hrs), Avg:99.6 F (37.6 C), Min:98.6 F (37 C), Max:101.2 F (38.4 C)   Recent Labs Lab 07/23/16 0409  07/25/16 0425 07/25/16 1837 07/28/16 0452 07/29/16 0835  WBC 11.9*  --   --   --   --  11.8*  CREATININE 0.60  --  0.67  --  0.75  --   VANCOTROUGH  --   < >  --  8*  --  22*  < > = values in this interval not displayed.  Estimated Creatinine Clearance: 76.2 mL/min (by C-G formula based on SCr of 0.75 mg/dL).    No Known Allergies  Antimicrobials this admission: Vanc 1/5 >> (2/19) Zosyn 1/5 >> 1/5  Dose adjustments this admission:  1/6 VT = 15 - on vanc 750 mg IV q 8 hrs 1/8 VT = 18 - on vanc 1g IV q 8 hrs 1/12 VT= 24 drawn from PICC line 1.5hr early - will leave dose alone for now 1/16 @ 0415 measured VT @30  mcg/ml (only 5 hours after evening dose) so corrects to ~24 mcg/ml >> will reduce to 750 mg/8h 1/18 VT 16 >> continue Vanc 750 mg/8h 1/21 VT 18 >> continue vanc 750mg /8h 1/23 VT 8 (AM dose got held for dental procedure)-not trough 1/25: VT 22 >> change vanc to 1g/q12h - changed to q12h dosing to help at discharge. Estimated ke  0.0758  Microbiology results:  1/5 BCx: 2/2 MRSA 1/5 BCID - MRSA 1/5 UCx: neg 1/5 MRSA PCR: (+) 1/6 resp panel: neg 1/6 HCV Ab: 11, viral load not calc- spontaneous clearing per ID 1/6 BCx: 1/2 MRSA 1/7 BCx:1/2 staph aureus 1/9 Bcx: neg 1/18 peritoneal fluid: ngtd  Thank you for allowing pharmacy to be a part of this patient's care.  Gwyndolyn KaufmanKai Chastity Noland Bernette Redbird(Kenny), PharmD  PGY1 Pharmacy Resident Pager: [phone] 07/29/2016 10:19 AM

## 2016-07-29 NOTE — Progress Notes (Signed)
PROGRESS NOTE                                                                                                                                                                                                             Patient Demographics:    Colman CaterConstance Dattilio, is a 36 y.o. female, DOB - [birth year], WUJ:[id number]  Admit date - 07/07/2016   Admitting Physician Eduard ClosArshad N Kakrakandy, MD  Outpatient Primary MD for the patient is No PCP Per Patient  LOS - 22   Chief Complaint  Patient presents with  . Vomiting       Brief Narrative   36 year old female with history of IV drug abuse, recently released from jail, admitted with fevers, workup significant for MRSA bacteremia, cavitary pneumonia, tricuspid endocarditis, dental abscess. In by ID with recommendation of IV vancomycin for total of 6 weeks, status post traction of multiple teeth extraction, had PICC line inserted, continued to spike fever.   Subjective:    Colman CaterConstance Copado today has, No headache, Complaints of right shoulder pain, cough, generalized weakness and fatigue, febrile 101.2 over last 24 hours .   Assessment  & Plan :    Principal Problem:   MRSA bacteremia Active Problems:   Sepsis (HCC)   Cavitary pneumonia   IVDU (intravenous drug user)   Cigarette smoker   ETOH abuse   Hepatitis C antibody test positive   Normocytic anemia   Thrombocytopenia (HCC)   Endocarditis of tricuspid valve   Acute septic pulmonary embolism without acute cor pulmonale (HCC)   Sepsis due to methicillin resistant Staphylococcus aureus (MRSA) (HCC)   Pressure injury of skin   Acute pain of right shoulder   Septic embolism (HCC)   Sepsis  - Secondary to MRSA bacteremia, TV endocarditis, septic pulmonary emboli, and acute bilateral by nephritis on admission. - Due to IV drug abuse, repeat blood cultures are negative, antibiotics management per ID, recommendation to continue with antibiotics for total  of 6 weeks, until February 19. - Patient remains with significant fever, discussed with ID, has right shoulder pain, will obtain MRI right shoulder to rule out septic arthritis, as well with significant chest pain or breathing, swallow obtain CT chest with IV contrast to rule out evolving abscess in the setting of septic pulmonary emboli. - We'll repeat 2 sets of blood culture  Dental  abscess -  had her teeth pulled giving dental abscess.  Pleural effusion - Status post ultrasound-guided thoracentesis 1/18 - Repeat CT chest today for further evaluation  History of substance abuse - She is counseled - Will decrease Dilaudid dose to 2 mg every 4 hours.  Shoulder pain - X-ray on admission with no acute findings, will obtain MRI right shoulder to rule out septic emboli causing septic arthritis  Tobacco abuse - Counseled on cessation. nicotine patch.  ETOH abuse - No signs of withdrawal. Monitor on CIWA. Added low dose clonidine  Thrombocytopenia (HCC) - On admission secondary to sepsis. Resolved.  Endocarditis of tricuspid valve - Continue IV antibiotic. Duration per ID. No signs of acute heart failure.  Iron deficiency anemia - Added supplement  Hypophosphatemia - Replenished. Check phosphorus in the morning.  Hepatitis C - Secondary to IV drug use.   Sacral pressure ulcer - Wound care consulted   - patient encouraged to ambulate, and use incentive spirometry  Code Status : Full  Family Communication  : Discussed with father at bedside  Disposition Plan  : We'll need SNF placement when medically cleared(not yet given fever), even with history of IV drug abuse, and she has a PICC line  Consults  :   Infectious disease CT surgery  Procedures  :  Teeth extraction 1/23 by Dr. Kristin Bruins Ultrasound guided thoracentesis 1/18  DVT Prophylaxis  :  Lovenox -  SCDs  Lab Results  Component Value Date   PLT 364 07/29/2016    Antibiotics  :    Anti-infectives      Start     Dose/Rate Route Frequency Ordered Stop   07/29/16 2200  vancomycin (VANCOCIN) IVPB 1000 mg/200 mL premix     1,000 mg 200 mL/hr over 60 Minutes Intravenous Every 12 hours 07/29/16 1016     07/27/16 2100  vancomycin (VANCOCIN) 1,250 mg in sodium chloride 0.9 % 250 mL IVPB  Status:  Discontinued     1,250 mg 166.7 mL/hr over 90 Minutes Intravenous Every 12 hours 07/27/16 1235 07/29/16 1016   07/26/16 2030  vancomycin (VANCOCIN) IVPB 750 mg/150 ml premix  Status:  Discontinued     750 mg 150 mL/hr over 60 Minutes Intravenous Every 8 hours 07/25/16 2011 07/25/16 2040   07/25/16 2040  vancomycin (VANCOCIN) IVPB 750 mg/150 ml premix  Status:  Discontinued     750 mg 150 mL/hr over 60 Minutes Intravenous Every 8 hours 07/25/16 2040 07/27/16 1235   07/18/16 1200  vancomycin (VANCOCIN) IVPB 750 mg/150 ml premix  Status:  Discontinued     750 mg 150 mL/hr over 60 Minutes Intravenous Every 8 hours 07/18/16 1002 07/25/16 2011   07/08/16 1515  vancomycin (VANCOCIN) IVPB 1000 mg/200 mL premix  Status:  Discontinued     1,000 mg 200 mL/hr over 60 Minutes Intravenous Every 8 hours 07/08/16 1501 07/18/16 0556   07/07/16 2200  piperacillin-tazobactam (ZOSYN) IVPB 3.375 g  Status:  Discontinued     3.375 g 12.5 mL/hr over 240 Minutes Intravenous Every 8 hours 07/07/16 1605 07/07/16 1754   07/07/16 1615  vancomycin (VANCOCIN) IVPB 750 mg/150 ml premix  Status:  Discontinued     750 mg 150 mL/hr over 60 Minutes Intravenous Every 8 hours 07/07/16 1605 07/08/16 1501   07/07/16 0813  piperacillin-tazobactam (ZOSYN) IVPB 3.375 g     3.375 g 100 mL/hr over 30 Minutes Intravenous  Once 07/07/16 0644 07/07/16 0839   07/07/16 0215  piperacillin-tazobactam (ZOSYN) IVPB 3.375 g  3.375 g 100 mL/hr over 30 Minutes Intravenous  Once 07/07/16 0213 07/07/16 0313   07/07/16 0215  vancomycin (VANCOCIN) IVPB 1000 mg/200 mL premix     1,000 mg 200 mL/hr over 60 Minutes Intravenous  Once 07/07/16 0213  07/07/16 0343        Objective:   Vitals:   07/28/16 2048 07/29/16 0000 07/29/16 0413 07/29/16 0900  BP: 114/72  104/66 117/68  Pulse: 94  82 77  Resp: 18  20 20   Temp: (!) 101.2 F (38.4 C) 98.7 F (37.1 C) 98.6 F (37 C) 98.5 F (36.9 C)  TempSrc: Oral Oral Oral Oral  SpO2: 97%  98% 98%  Weight:      Height:        Wt Readings from Last 3 Encounters:  07/27/16 54 kg (119 lb 0.8 oz)     Intake/Output Summary (Last 24 hours) at 07/29/16 1354 Last data filed at 07/29/16 1000  Gross per 24 hour  Intake             2500 ml  Output              800 ml  Net             1700 ml     Physical Exam  Awake Alert, Oriented X 3, Chronically ill-appearing Supple Neck,No JVD, No cervical lymphadenopathy appriciated.  Symmetrical Chest wall movement, Good air movement bilaterally, CTAB RRR,No Gallops,Rubs or new Murmurs, No Parasternal Heave +ve B.Sounds, Abd Soft, No tenderness, , No rebound - guarding or rigidity. No Cyanosis, Clubbing or edema, No new Rash or bruise      Data Review:    CBC  Recent Labs Lab 07/23/16 0409 07/29/16 0835  WBC 11.9* 11.8*  HGB 9.0* 9.8*  HCT 29.0* 32.4*  PLT 318 364  MCV 85.5 87.1  MCH 26.5 26.3  MCHC 31.0 30.2  RDW 16.4* 17.0*    Chemistries   Recent Labs Lab 07/23/16 0409 07/25/16 0425 07/28/16 0452  NA 133* 134* 133*  K 4.6 4.6 5.1  CL 97* 98* 96*  CO2 28 27 29   GLUCOSE 104* 89 100*  BUN 11 9 13   CREATININE 0.60 0.67 0.75  CALCIUM 8.8* 8.9 9.2   ------------------------------------------------------------------------------------------------------------------ No results for input(s): CHOL, HDL, LDLCALC, TRIG, CHOLHDL, LDLDIRECT in the last 72 hours.  No results found for: HGBA1C ------------------------------------------------------------------------------------------------------------------ No results for input(s): TSH, T4TOTAL, T3FREE, THYROIDAB in the last 72 hours.  Invalid input(s):  FREET3 ------------------------------------------------------------------------------------------------------------------ No results for input(s): VITAMINB12, FOLATE, FERRITIN, TIBC, IRON, RETICCTPCT in the last 72 hours.  Coagulation profile No results for input(s): INR, PROTIME in the last 168 hours.  No results for input(s): DDIMER in the last 72 hours.  Cardiac Enzymes No results for input(s): CKMB, TROPONINI, MYOGLOBIN in the last 168 hours.  Invalid input(s): CK ------------------------------------------------------------------------------------------------------------------ No results found for: BNP  Inpatient Medications  Scheduled Meds: . cloNIDine  0.1 mg Oral BID  . enoxaparin (LOVENOX) injection  40 mg Subcutaneous Q24H  . feeding supplement (ENSURE ENLIVE)  237 mL Oral TID BM  . feeding supplement (PRO-STAT SUGAR FREE 64)  30 mL Oral BID  . ferrous sulfate  325 mg Oral BID WC  . folic acid  1 mg Oral Daily  . lidocaine  1 patch Transdermal Q24H  . meloxicam  15 mg Oral Daily  . multivitamin with minerals  1 tablet Oral Daily  . nicotine  21 mg Transdermal Daily  . sodium chloride flush  10-40 mL Intracatheter Q12H  . thiamine  100 mg Oral Daily  . triamcinolone ointment   Topical BID  . vancomycin  1,000 mg Intravenous Q12H   Continuous Infusions: . lactated ringers    . sodium chloride irrigation     PRN Meds:.acetaminophen **OR** acetaminophen, albuterol, hydrALAZINE, HYDROmorphone, ondansetron **OR** ondansetron (ZOFRAN) IV, oxyCODONE, polyethylene glycol, sodium chloride flush, zolpidem  Micro Results Recent Results (from the past 240 hour(s))  Culture, body fluid-bottle     Status: None   Collection Time: 07/20/16 11:27 AM  Result Value Ref Range Status   Specimen Description PERITONEAL  Final   Special Requests NONE  Final   Culture NO GROWTH 5 DAYS  Final   Report Status 07/25/2016 FINAL  Final  Gram stain     Status: None   Collection Time:  07/20/16 11:27 AM  Result Value Ref Range Status   Specimen Description PERITONEAL  Final   Special Requests NONE  Final   Gram Stain   Final    FEW WBC PRESENT,BOTH PMN AND MONONUCLEAR NO ORGANISMS SEEN    Report Status 07/20/2016 FINAL  Final    Radiology Reports Dg Orthopantogram  Result Date: 07/20/2016 CLINICAL DATA:  Sepsis.  Tooth abscess. EXAM: ORTHOPANTOGRAM/PANORAMIC COMPARISON:  CT 02/18/2011. FINDINGS: No acute bony abnormality identified. No evidence of fracture or dislocation. Periapical lucencies noted throughout the remaining teeth along the mandible suggesting dental disease. No prominent lytic lesion noted. No fracture . If further evaluation is needed maxillofacial CT can be obtained. IMPRESSION: Muscle mild periapical lucencies are noted throughout the remaining teeth of the lower mandible. No prominent lytic lesion noted. Electronically Signed   By: Maisie Fus  Register   On: 07/20/2016 11:19   Dg Chest 1 View  Result Date: 07/20/2016 CLINICAL DATA:  Status post right thoracentesis. EXAM: CHEST 1 VIEW COMPARISON:  07/16/2016 FINDINGS: Right arm PICC line tip is in the projection of the cavoatrial junction. Normal heart size. Bilateral pleural effusions are identified, left greater than right. There is mild interstitial edema. Persistent bilateral mid lung and lower lobe opacities. IMPRESSION: 1. No change in bilateral pleural effusions and bilateral lung opacities. Electronically Signed   By: Signa Kell M.D.   On: 07/20/2016 11:08   Dg Chest 2 View  Result Date: 07/21/2016 CLINICAL DATA:  Sepsis. EXAM: CHEST  2 VIEW COMPARISON:  07/20/2016. FINDINGS: Right PICC line stable position. Heart size stable. Multifocal bilateral pulmonary infiltrates and bilateral pleural effusions again noted. Pulmonary infiltrates have progressed slightly. IMPRESSION: 1.  Right PICC line stable position. 2. Progressive multifocal bilateral pulmonary infiltrates. Persistent bilateral pleural  effusions. Electronically Signed   By: Maisie Fus  Register   On: 07/21/2016 12:03   Dg Chest 2 View  Result Date: 07/13/2016 CLINICAL DATA:  Pneumonia EXAM: CHEST  2 VIEW COMPARISON:  07/08/2016 FINDINGS: Bilateral pleural effusions, left greater than right with lower lobe airspace opacities, also worse on the left. This is improved somewhat on the right, not significantly changed on the left. Right PICC line is in place with the tip at the cavoatrial junction. Low lung volumes. Heart is normal size. IMPRESSION: Bilateral lower lobe airspace opacities, left greater than right. Some improvement on the right since prior study with no change on the left. Small bilateral effusions. Electronically Signed   By: Charlett Nose M.D.   On: 07/13/2016 13:19   Dg Chest 2 View  Result Date: 07/07/2016 CLINICAL DATA:  Nausea vomiting and diarrhea for 8 days EXAM: CHEST  2 VIEW  COMPARISON:  None. FINDINGS: Numerous ill-defined nodules throughout both lungs, many cavitary. No confluent consolidation. No large effusion. Hilar, mediastinal and cardiac contours are unremarkable. IMPRESSION: Innumerable nodules, many cavitary. Septic emboli are a leading consideration. Noninfectious inflammatory process or neoplastic process are less likely possibilities. Electronically Signed   By: Ellery Plunk M.D.   On: 07/07/2016 01:55   Dg Shoulder Right  Result Date: 07/18/2016 CLINICAL DATA:  Right shoulder pain with fever and sepsis. EXAM: RIGHT SHOULDER - 2+ VIEW COMPARISON:  Chest x-ray 07/16/2016 FINDINGS: Right-sided PICC line unchanged. Bones, joint spaces and soft tissues over the right shoulder are within normal. Stable changes within the right lung. IMPRESSION: Unremarkable right shoulder. Electronically Signed   By: Elberta Fortis M.D.   On: 07/18/2016 10:49   Ct Chest W Contrast  Result Date: 07/19/2016 CLINICAL DATA:  Pneumonia, cough, fever, septic embolism, history hepatitis-C, asthma, IV drug abuse, followup EXAM:  CT CHEST WITH CONTRAST TECHNIQUE: Multidetector CT imaging of the chest was performed during intravenous contrast administration. Sagittal and coronal MPR images reconstructed from axial data set. CONTRAST:  75 mL ISOVUE-300 IOPAMIDOL (ISOVUE-300) INJECTION 61% IV COMPARISON:  07/07/2016 FINDINGS: Cardiovascular: Vascular structures grossly patent on nondedicated exam. RIGHT arm PICC line with tip in SVC near cavoatrial junction. No pericardial effusion. Mediastinum/Nodes: Few scattered normal size mediastinal lymph nodes. No definite thoracic adenopathy. Esophagus unremarkable. Base of cervical region normal appearance. Lungs/Pleura: Multiple cavitary nodules are seen within both lungs consistent with septic emboli. Number of nodules has decreased since the previous exam, with some of the smaller nodule seen previously no longer identified. Remaining nodules are stable to decreased in sizes ; RIGHT upper lobe nodule measured previously at 21 mm now measures 16 mm image 32 and previously measured LEFT upper lobe nodule of 23 mm measures 16 mm at present. BILATERAL pleural effusions with compressive atelectasis of the lower lobes. No pneumothorax. Upper Abdomen: Beam hardening artifacts from patient's RIGHT arm. Spleen appears enlarged. No additional upper abdominal abnormalities seen. Musculoskeletal: No acute osseous findings. IMPRESSION: New BILATERAL pleural effusions with compressive atelectasis of the lower lobes. Cavitary lung nodules bilaterally compatible with septic emboli, decreased in number with remaining nodules stable to decreased in sizes since the previous exam. Electronically Signed   By: Ulyses Southward M.D.   On: 07/19/2016 13:01   Ct Angio Chest Pe W And/or Wo Contrast  Result Date: 07/07/2016 CLINICAL DATA:  Short of breath fever, tachycardia. IV drug abuse. Leukocytosis. EXAM: CT ANGIOGRAPHY CHEST CT ABDOMEN AND PELVIS WITH CONTRAST TECHNIQUE: Multidetector CT imaging of the chest was performed  using the standard protocol during bolus administration of intravenous contrast. Multiplanar CT image reconstructions and MIPs were obtained to evaluate the vascular anatomy. Multidetector CT imaging of the abdomen and pelvis was performed using the standard protocol during bolus administration of intravenous contrast. CONTRAST:  100 mL Isovue COMPARISON:  Chest radiograph 07/07/2016 FINDINGS: CTA CHEST FINDINGS Cardiovascular: No filling defects within the pulmonary to suggest acute pulmonary embolism. No acute findings of the aorta great vessels. No pericardial effusion. Heart is grossly normal. Mediastinum/Nodes: No axillary or supraclavicular adenopathy. No mediastinal hilar adenopathy. Ill-defined peribronchial thickening in the hila. Lungs/Pleura: Bilateral thick-walled cavitary nodules in the upper and lower lobes. Consolidated airspace disease in the lower lobes. Approximately 30 nodules per lung. Example nodule cavitary nodule in the RIGHT upper lobe measures 21 mm (image 27, series 6). Example cavitary nodule in the LEFT upper lobe measures 23 mm on image 30 series 6. Musculoskeletal:  No aggressive osseous lesion. Review of the MIP images confirms the above findings. CT ABDOMEN and PELVIS FINDINGS Hepatobiliary: Liver is enlarged. No focal hepatic lesion. Periportal edema noted. Portal veins are patent. Gallbladder wall is edematous and thickened to 6 mm. The gallbladder lumen is contracted. Common bile duct normal caliber. Pancreas: No pancreatic inflammation or duct dilatation Spleen: Spleen is enlarged measuring 14.7 by 7.5 x 12.4 cm (volume = 720 cm^3). Low-density lesion in the spleen measuring 12 mm does not have simple fluid attenuation. Adrenals/Urinary Tract: Adrenal glands are normal. There is heterogeneous enhancement of the kidneys on the cortical phase imaging (image 32, series 2 for example). On the delayed pyelogram phase imaging the kidneys have a striated appearance (image 14 series 17).  No renal obstruction. Bladder normal Stomach/Bowel: Stomach, small bowel, appendix, and cecum are normal. The colon and rectosigmoid colon are normal. Vascular/Lymphatic: Abdominal aorta is normal caliber. No abdominal lymphadenopathy. Reproductive: Uterus and ovaries grossly normal Other: Smaller free fluid in the posterior pelvis Musculoskeletal: No aggressive osseous lesion. Review of the MIP images confirms the above findings. IMPRESSION: Chest Impression: 1. Bilateral thick-walled cavitary nodules consistent with SEPTIC PULMONARY EMBOLI in this population (young patient with IV drug use). 2. Bibasilar consolidation consistent with pneumonia Abdomen / Pelvis Impression: 1. 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 2. Hepatosplenomegaly. Low-density lesion in the spleen is indeterminate. 3. Small volume of intraperitoneal free fluid and periportal edema may relate to hepatic dysfunction. Electronically Signed   By: Genevive Bi M.D.   On: 07/07/2016 09:29   Ct Abdomen Pelvis W Contrast  Result Date: 07/07/2016 CLINICAL DATA:  Short of breath fever, tachycardia. IV drug abuse. Leukocytosis. EXAM: CT ANGIOGRAPHY CHEST CT ABDOMEN AND PELVIS WITH CONTRAST TECHNIQUE: Multidetector CT imaging of the chest was performed using the standard protocol during bolus administration of intravenous contrast. Multiplanar CT image reconstructions and MIPs were obtained to evaluate the vascular anatomy. Multidetector CT imaging of the abdomen and pelvis was performed using the standard protocol during bolus administration of intravenous contrast. CONTRAST:  100 mL Isovue COMPARISON:  Chest radiograph 07/07/2016 FINDINGS: CTA CHEST FINDINGS Cardiovascular: No filling defects within the pulmonary to suggest acute pulmonary embolism. No acute findings of the aorta great vessels. No pericardial effusion. Heart is grossly normal. Mediastinum/Nodes: No axillary or supraclavicular adenopathy. No  mediastinal hilar adenopathy. Ill-defined peribronchial thickening in the hila. Lungs/Pleura: Bilateral thick-walled cavitary nodules in the upper and lower lobes. Consolidated airspace disease in the lower lobes. Approximately 30 nodules per lung. Example nodule cavitary nodule in the RIGHT upper lobe measures 21 mm (image 27, series 6). Example cavitary nodule in the LEFT upper lobe measures 23 mm on image 30 series 6. Musculoskeletal: No aggressive osseous lesion. Review of the MIP images confirms the above findings. CT ABDOMEN and PELVIS FINDINGS Hepatobiliary: Liver is enlarged. No focal hepatic lesion. Periportal edema noted. Portal veins are patent. Gallbladder wall is edematous and thickened to 6 mm. The gallbladder lumen is contracted. Common bile duct normal caliber. Pancreas: No pancreatic inflammation or duct dilatation Spleen: Spleen is enlarged measuring 14.7 by 7.5 x 12.4 cm (volume = 720 cm^3). Low-density lesion in the spleen measuring 12 mm does not have simple fluid attenuation. Adrenals/Urinary Tract: Adrenal glands are normal. There is heterogeneous enhancement of the kidneys on the cortical phase imaging (image 32, series 2 for example). On the delayed pyelogram phase imaging the kidneys have a striated appearance (image 14 series 17). No renal obstruction. Bladder normal Stomach/Bowel:  Stomach, small bowel, appendix, and cecum are normal. The colon and rectosigmoid colon are normal. Vascular/Lymphatic: Abdominal aorta is normal caliber. No abdominal lymphadenopathy. Reproductive: Uterus and ovaries grossly normal Other: Smaller free fluid in the posterior pelvis Musculoskeletal: No aggressive osseous lesion. Review of the MIP images confirms the above findings. IMPRESSION: Chest Impression: 1. Bilateral thick-walled cavitary nodules consistent with SEPTIC PULMONARY EMBOLI in this population (young patient with IV drug use). 2. Bibasilar consolidation consistent with pneumonia Abdomen / Pelvis  Impression: 1. 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 2. Hepatosplenomegaly. Low-density lesion in the spleen is indeterminate. 3. Small volume of intraperitoneal free fluid and periportal edema may relate to hepatic dysfunction. Electronically Signed   By: Genevive Bi M.D.   On: 07/07/2016 09:29   Dg Chest Port 1 View  Result Date: 07/16/2016 CLINICAL DATA:  MRSA bacteremia with septic pulmonary emboli. EXAM: PORTABLE CHEST 1 VIEW COMPARISON:  07/13/2016. FINDINGS: Right PICC tip at the superior cavoatrial junction. Normal sized heart. Mildly increased bilateral airspace opacity and bilateral pleural fluid. Unremarkable bones. IMPRESSION: Mildly increased bilateral pneumonia and pleural fluid. Electronically Signed   By: Beckie Salts M.D.   On: 07/16/2016 09:53   Dg Chest Port 1 View  Result Date: 07/08/2016 CLINICAL DATA:  Patient admitted yesterday with MR SA bacteremia. Possible aspiration today. EXAM: PORTABLE CHEST 1 VIEW COMPARISON:  CT chest and PA and lateral chest 07/07/2014. FINDINGS: Multiple nodular opacities bilaterally again seen. There is increased bibasilar airspace disease. No pneumothorax. Small pleural effusions noted. Heart size is normal. IMPRESSION: Increased bibasilar airspace disease could be due to atelectasis, aspiration and/or pneumonia. Multifocal nodular opacities compatible with septic emboli as seen on chest CT yesterday. Electronically Signed   By: Drusilla Kanner M.D.   On: 07/08/2016 12:07   US Thoracentesis Asp Pleural Space W/img Guide  Result Date: 07/20/2016 INDICATION: Pneumonia, cough, fever, septic embolism, history of hepatitis-C, asthma, IV drug abuse, bilateral pleural effusions seen on CT scan. Request for diagnostic and therapeutic thoracentesis. EXAM: ULTRASOUND GUIDED RIGHT THORACENTESIS MEDICATIONS: 1% Lidocaine = 10 mL. COMPLICATIONS: None immediate. PROCEDURE: An ultrasound guided thoracentesis was thoroughly  discussed with the patient and questions answered. The benefits, risks, alternatives and complications were also discussed. The patient understands and wishes to proceed with the procedure. Written consent was obtained. Ultrasound was performed to localize and mark an adequate pocket of fluid in the right chest. The effusion appeared loculated. The area was then prepped and draped in the normal sterile fashion. 1% Lidocaine was used for local anesthesia. Under ultrasound guidance a 6 Fr Safe-T-Centesis catheter was introduced. Thoracentesis was performed. The catheter was removed and a dressing applied. FINDINGS: A total of approximately 160 mL of blood tinged fluid was removed. Samples were sent to the laboratory as requested by the clinical team. IMPRESSION: Successful ultrasound guided right thoracentesis yielding only 160 mL of pleural fluid due to loculations. Read by:  Corrin Parker, PA-C Electronically Signed   By: Richarda Overlie M.D.   On: 07/20/2016 10:38     Kaison Mcparland M.D on 07/29/2016 at 1:54 PM  Between 7am to 7pm - Pager - [phone]  After 7pm go to [email or web address] - password [facility]  [facility] -  Office  [phone]

## 2016-07-30 ENCOUNTER — Inpatient Hospital Stay (HOSPITAL_COMMUNITY): Payer: Self-pay

## 2016-07-30 LAB — BASIC METABOLIC PANEL
Anion gap: 10 (ref 5–15)
BUN: 9 mg/dL (ref 6–20)
CALCIUM: 9.3 mg/dL (ref 8.9–10.3)
CO2: 28 mmol/L (ref 22–32)
CREATININE: 0.69 mg/dL (ref 0.44–1.00)
Chloride: 93 mmol/L — ABNORMAL LOW (ref 101–111)
Glucose, Bld: 122 mg/dL — ABNORMAL HIGH (ref 65–99)
Potassium: 4.8 mmol/L (ref 3.5–5.1)
SODIUM: 131 mmol/L — AB (ref 135–145)

## 2016-07-30 LAB — DIFFERENTIAL
BASOS PCT: 1 %
Basophils Absolute: 0.1 10*3/uL (ref 0.0–0.1)
Eosinophils Absolute: 0.4 10*3/uL (ref 0.0–0.7)
Eosinophils Relative: 3 %
Lymphocytes Relative: 20 %
Lymphs Abs: 2.6 10*3/uL (ref 0.7–4.0)
MONO ABS: 1.4 10*3/uL — AB (ref 0.1–1.0)
Monocytes Relative: 11 %
Neutro Abs: 8.6 10*3/uL — ABNORMAL HIGH (ref 1.7–7.7)
Neutrophils Relative %: 65 %

## 2016-07-30 LAB — CBC
HCT: 32.3 % — ABNORMAL LOW (ref 36.0–46.0)
Hemoglobin: 9.8 g/dL — ABNORMAL LOW (ref 12.0–15.0)
MCH: 26.1 pg (ref 26.0–34.0)
MCHC: 30.3 g/dL (ref 30.0–36.0)
MCV: 86.1 fL (ref 78.0–100.0)
PLATELETS: 376 10*3/uL (ref 150–400)
RBC: 3.75 MIL/uL — ABNORMAL LOW (ref 3.87–5.11)
RDW: 17 % — AB (ref 11.5–15.5)
WBC: 12.8 10*3/uL — AB (ref 4.0–10.5)

## 2016-07-30 MED ORDER — HYDROCORTISONE 1 % EX CREA
TOPICAL_CREAM | Freq: Two times a day (BID) | CUTANEOUS | Status: DC
  Administered 2016-07-30 – 2016-08-08 (×15): via TOPICAL
  Filled 2016-07-30: qty 28

## 2016-07-30 MED ORDER — GADOBENATE DIMEGLUMINE 529 MG/ML IV SOLN
10.0000 mL | Freq: Once | INTRAVENOUS | Status: AC | PRN
  Administered 2016-07-30: 10 mL via INTRAVENOUS

## 2016-07-30 MED ORDER — SODIUM CHLORIDE 0.9 % IV SOLN
INTRAVENOUS | Status: DC
  Administered 2016-07-30 – 2016-08-03 (×5): via INTRAVENOUS

## 2016-07-30 MED ORDER — LINEZOLID 600 MG/300ML IV SOLN
600.0000 mg | Freq: Two times a day (BID) | INTRAVENOUS | Status: DC
  Administered 2016-07-30 – 2016-08-01 (×4): 600 mg via INTRAVENOUS
  Filled 2016-07-30 (×5): qty 300

## 2016-07-30 NOTE — Progress Notes (Addendum)
[facility]    Date of Admission:  07/07/2016   Total days of antibiotics 24        Day 24 vancomycin           ID: Anna Colon is a 36 y.o. 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 Principal Problem:   MRSA bacteremia Active Problems:   Sepsis (HCC)   Cavitary pneumonia   IVDU (intravenous drug user)   Cigarette smoker   ETOH abuse   Hepatitis C antibody test positive   Normocytic anemia   Thrombocytopenia (HCC)   Endocarditis of tricuspid valve   Acute septic pulmonary embolism without acute cor pulmonale (HCC)   Sepsis due to methicillin resistant Staphylococcus aureus (MRSA) (HCC)   Pressure injury of skin   Acute pain of right shoulder   Septic embolism (HCC)    Subjective: Fevers up to 102 x 3 days but was previously afebrile for 2 days prior to that (though persistently febrile throughout hospitalization)  Complains of puritic rash to torso  - recently vanco trough was subtherapeutic  Medications:  . cloNIDine  0.1 mg Oral BID  . enoxaparin (LOVENOX) injection  40 mg Subcutaneous Q24H  . feeding supplement (ENSURE ENLIVE)  237 mL Oral TID BM  . feeding supplement (PRO-STAT SUGAR FREE 64)  30 mL Oral BID  . ferrous sulfate  325 mg Oral BID WC  . folic acid  1 mg Oral Daily  . lidocaine  1 patch Transdermal Q24H  . meloxicam  15 mg Oral Daily  . multivitamin with minerals  1 tablet Oral Daily  . nicotine  21 mg Transdermal Daily  . sodium chloride flush  10-40 mL Intracatheter Q12H  . thiamine  100 mg Oral Daily  . triamcinolone ointment   Topical BID  . vancomycin  1,000 mg Intravenous Q12H    Objective: Vital signs in last 24 hours: Temp:  [98.5 F (36.9 C)-101.8 F (38.8 C)] 101.8 F (38.8 C) (01/28 0516) Pulse Rate:  [82-107] 107 (01/28 0516) Resp:  [18-20] 18 (01/28 0516) BP: (104-112)/(59-65) 104/65 (01/28 0516) SpO2:  [94  %-98 %] 98 % (01/28 0516)  Physical Exam  Constitutional:  oriented to person, place, and time. appears well-developed and well-nourished. No distress.  HENT: [facility]/AT, PERRLA, no scleral icterus Mouth/Throat: Oropharynx is clear and moist. No oropharyngeal exudate.  Cardiovascular: Normal rate, regular rhythm and normal heart sounds. Exam reveals no gallop and no friction rub.  No murmur heard.  Pulmonary/Chest: Effort normal and breath sounds normal. No respiratory distress.  has no wheezes.  Neck = supple, no nuchal rigidity Abdominal: Soft. Bowel sounds are normal.  exhibits no distension. There is no tenderness.  Lymphadenopathy: no cervical adenopathy. No axillary adenopathy Neurological: alert and oriented to person, place, and time.  Skin: macular papular rash to back and chest Psychiatric: a normal mood and affect.  behavior is normal.    Lab Results  Recent Labs  07/28/16 0452 07/29/16 0835 07/30/16 0117  WBC  --  11.8* 12.8*  HGB  --  9.8* 9.8*  HCT  --  32.4* 32.3*  NA 133*  --  131*  K 5.1  --  4.8  CL 96*  --  93*  CO2 29  --  28  BUN 13  --  9  CREATININE 0.75  --  0.69    Microbiology: 1/27 blood cx pending 1/9  blood cx NGTD 1/18 peritoneal fluid cx ngtd  Studies/Results: Ct Chest W Contrast  Result Date: 07/30/2016 CLINICAL DATA:  History of IV drug abuse.  MRSA bacteremia. EXAM: CT CHEST WITH CONTRAST TECHNIQUE: Multidetector CT imaging of the chest was performed during intravenous contrast administration. CONTRAST:  75mL ISOVUE-300 IOPAMIDOL (ISOVUE-300) INJECTION 61% COMPARISON:  CT 07/19/2017 FINDINGS: Cardiovascular: Heart is normal size. Aorta is normal caliber. Mediastinum/Nodes: No mediastinal, hilar, or axillary adenopathy. Lungs/Pleura: Small to moderate bilateral pleural effusions, stable since prior study. Numerous lung nodules bilaterally, many of which are cavitary, compatible with septic emboli. These have likely slightly improved in overall  size since prior study. Compressive atelectasis in the lower lobes. Upper Abdomen: Imaging into the upper abdomen shows no acute findings. Musculoskeletal: Chest wall soft tissues are unremarkable. No acute bony abnormality. IMPRESSION: Numerous bilateral pulmonary nodules, most of which are cavitary compatible with septic emboli. These may have improved slightly in overall size and number since prior study. Small bilateral pleural effusions, slightly improved since prior study. Compressive atelectasis in the lower lobes. Electronically Signed   By: Charlett Nose M.D.   On: 07/30/2016 07:18     Assessment/Plan: MRSA bacteremia with TV endocarditis c/b pulmonary septic emboli - ongoing fevers initially thought to be due to cavitary lung lesions and septic emboli though she did have a period of being afebrile. Fever could be due to ongoing infection in lungs, other sites vs drug fever  - has pruritic rash that I wonder is due to vancomycin - will check differential to see if eosinophilia. In the meantime, will change to linezolid to see if any improvement in symptoms - will start on hydrocortisone cream 1% to help relieve symptoms  - she also has right shoulder pain, concern for septic arthritis - recommend MRI of right shoulder  Anna Colon, [facility] Cell: [phone] Pager: [phone]  07/30/2016, 1:29 PM

## 2016-07-30 NOTE — Progress Notes (Signed)
SATURATION QUALIFICATIONS: (This note is used to comply with regulatory documentation for home oxygen)  Patient Saturations on Room Air at Rest =   98 %  Patient Saturations on Room Air while Ambulating =  94 %  Patient Saturations on   N/a  Liters of oxygen while Ambulating =  n/a  Please briefly explain why patient needs home oxygen:

## 2016-07-30 NOTE — Progress Notes (Signed)
Refusing to go to MRI at this time.Female friend in room visiting and patient stating she is tired and will not go till the am

## 2016-07-30 NOTE — Progress Notes (Signed)
Patient ambulated on the entire hallway,no discomfort noted,no complaint and her gait is steady.Seate in her recliner for several hours.

## 2016-07-30 NOTE — Progress Notes (Signed)
PROGRESS NOTE                                                                                                                                                                                                             Patient Demographics:    Chaelyn Bunyan, is a 36 y.o. female, DOB - [birth year], EPP:[id number]  Admit date - 07/07/2016   Admitting Physician Eduard Clos, MD  Outpatient Primary MD for the patient is No PCP Per Patient  LOS - 23   Chief Complaint  Patient presents with  . Vomiting       Brief Narrative   36 year old female with history of IV drug abuse, recently released from jail, admitted with fevers, workup significant for MRSA bacteremia, cavitary pneumonia, tricuspid endocarditis, dental abscess. Seen by ID with recommendation of IV vancomycin for total of 6 weeks, status post extraction of multiple teeth extraction, had PICC line inserted, continued to spike fever.   Subjective:    Kamaiya Antilla today has, No headache, Complaints of right shoulder pain, cough, generalized weakness and fatigue, febrile 101.8 This a.m.   Assessment  & Plan :    Principal Problem:   MRSA bacteremia Active Problems:   Sepsis (HCC)   Cavitary pneumonia   IVDU (intravenous drug user)   Cigarette smoker   ETOH abuse   Hepatitis C antibody test positive   Normocytic anemia   Thrombocytopenia (HCC)   Endocarditis of tricuspid valve   Acute septic pulmonary embolism without acute cor pulmonale (HCC)   Sepsis due to methicillin resistant Staphylococcus aureus (MRSA) (HCC)   Pressure injury of skin   Acute pain of right shoulder   Septic embolism (HCC)   Sepsis  - Secondary to MRSA bacteremia, TV endocarditis, septic pulmonary emboli, and acute bilateral by nephritis on admission. - Due to IV drug abuse, repeat blood cultures are negative, antibiotics management per ID, recommendation to continue with antibiotics for total of 6  weeks, until February 19. - Patient remains with significant fever, discussed with ID, has right shoulder pain,MRI right shoulder to rule out septic arthritis, as well with significant chest pain or breathing, pleuritic CT chest with contrast showing improvement of septic emboli, but significant for atelectasis. - Continues to use incentive spirometry - Most recent blood cultures repeated on 1/27, so far no growth to date  Dental abscess -  had her teeth pulled giving dental abscess.  Pleural effusion - Status post ultrasound-guided thoracentesis 1/18 - Repeat CT chest 1/27 with light improvement of septic emboli  History of substance abuse - She is counseled - decreased Dilaudid dose to 2 mg every 4 hours.  Shoulder pain - X-ray on admission with no acute findings, will obtain MRI right shoulder to rule out septic emboli causing septic arthritis  Tobacco abuse - Counseled on cessation. nicotine patch.  ETOH abuse - No signs of withdrawal. Monitor on CIWA. Added low dose clonidine  Thrombocytopenia (HCC) - On admission secondary to sepsis. Resolved.  Endocarditis of tricuspid valve - Continue IV antibiotic. Duration per ID. No signs of acute heart failure.  Iron deficiency anemia - Added supplement  Hypophosphatemia - Replenished. Check phosphorus in the morning.  Hepatitis C - Secondary to IV drug use.   Sacral pressure ulcer - Wound care consulted   - patient encouraged to ambulate, and use incentive spirometry  Code Status : Full  Family Communication  : Discussed with father at bedside 1/27  Disposition Plan  : We'll need SNF placement when medically cleared(not yet given fever), even with history of IV drug abuse, and she has a PICC line  Consults  :   Infectious disease CT surgery  Procedures  :  Teeth extraction 1/23 by Dr. Kristin Bruins Ultrasound guided thoracentesis 1/18  DVT Prophylaxis  :  Lovenox -  SCDs  Lab Results  Component Value Date    PLT 376 07/30/2016    Antibiotics  :    Anti-infectives    Start     Dose/Rate Route Frequency Ordered Stop   07/29/16 2200  vancomycin (VANCOCIN) IVPB 1000 mg/200 mL premix     1,000 mg 200 mL/hr over 60 Minutes Intravenous Every 12 hours 07/29/16 1016     07/27/16 2100  vancomycin (VANCOCIN) 1,250 mg in sodium chloride 0.9 % 250 mL IVPB  Status:  Discontinued     1,250 mg 166.7 mL/hr over 90 Minutes Intravenous Every 12 hours 07/27/16 1235 07/29/16 1016   07/26/16 2030  vancomycin (VANCOCIN) IVPB 750 mg/150 ml premix  Status:  Discontinued     750 mg 150 mL/hr over 60 Minutes Intravenous Every 8 hours 07/25/16 2011 07/25/16 2040   07/25/16 2040  vancomycin (VANCOCIN) IVPB 750 mg/150 ml premix  Status:  Discontinued     750 mg 150 mL/hr over 60 Minutes Intravenous Every 8 hours 07/25/16 2040 07/27/16 1235   07/18/16 1200  vancomycin (VANCOCIN) IVPB 750 mg/150 ml premix  Status:  Discontinued     750 mg 150 mL/hr over 60 Minutes Intravenous Every 8 hours 07/18/16 1002 07/25/16 2011   07/08/16 1515  vancomycin (VANCOCIN) IVPB 1000 mg/200 mL premix  Status:  Discontinued     1,000 mg 200 mL/hr over 60 Minutes Intravenous Every 8 hours 07/08/16 1501 07/18/16 0556   07/07/16 2200  piperacillin-tazobactam (ZOSYN) IVPB 3.375 g  Status:  Discontinued     3.375 g 12.5 mL/hr over 240 Minutes Intravenous Every 8 hours 07/07/16 1605 07/07/16 1754   07/07/16 1615  vancomycin (VANCOCIN) IVPB 750 mg/150 ml premix  Status:  Discontinued     750 mg 150 mL/hr over 60 Minutes Intravenous Every 8 hours 07/07/16 1605 07/08/16 1501   07/07/16 0813  piperacillin-tazobactam (ZOSYN) IVPB 3.375 g     3.375 g 100 mL/hr over 30 Minutes Intravenous  Once 07/07/16 0644 07/07/16 0839   07/07/16 0215  piperacillin-tazobactam (ZOSYN)  IVPB 3.375 g     3.375 g 100 mL/hr over 30 Minutes Intravenous  Once 07/07/16 0213 07/07/16 0313   07/07/16 0215  vancomycin (VANCOCIN) IVPB 1000 mg/200 mL premix     1,000  mg 200 mL/hr over 60 Minutes Intravenous  Once 07/07/16 0213 07/07/16 0343        Objective:   Vitals:   07/29/16 2000 07/29/16 2229 07/30/16 0245 07/30/16 0516  BP:  104/64  104/65  Pulse:  98  (!) 107  Resp: 20 20  18   Temp: (!) 101 F (38.3 C) (!) 101.5 F (38.6 C) 100.1 F (37.8 C) (!) 101.8 F (38.8 C)  TempSrc: Oral Oral Oral Oral  SpO2:  94%  98%  Weight:      Height:        Wt Readings from Last 3 Encounters:  07/27/16 54 kg (119 lb 0.8 oz)     Intake/Output Summary (Last 24 hours) at 07/30/16 1343 Last data filed at 07/30/16 1300  Gross per 24 hour  Intake              240 ml  Output              550 ml  Net             -310 ml     Physical Exam  Awake Alert, Oriented X 3, Chronically ill-appearing Supple Neck,No JVD, No cervical lymphadenopathy appriciated.  Symmetrical Chest wall movement, Good air movement bilaterally, CTAB RRR,No Gallops,Rubs or new Murmurs, No Parasternal Heave +ve B.Sounds, Abd Soft, No tenderness, , No rebound - guarding or rigidity. No Cyanosis, Clubbing or edema, No new Rash or bruise      Data Review:    CBC  Recent Labs Lab 07/29/16 0835 07/30/16 0117  WBC 11.8* 12.8*  HGB 9.8* 9.8*  HCT 32.4* 32.3*  PLT 364 376  MCV 87.1 86.1  MCH 26.3 26.1  MCHC 30.2 30.3  RDW 17.0* 17.0*    Chemistries   Recent Labs Lab 07/25/16 0425 07/28/16 0452 07/30/16 0117  NA 134* 133* 131*  K 4.6 5.1 4.8  CL 98* 96* 93*  CO2 27 29 28   GLUCOSE 89 100* 122*  BUN 9 13 9   CREATININE 0.67 0.75 0.69  CALCIUM 8.9 9.2 9.3   ------------------------------------------------------------------------------------------------------------------ No results for input(s): CHOL, HDL, LDLCALC, TRIG, CHOLHDL, LDLDIRECT in the last 72 hours.  No results found for: HGBA1C ------------------------------------------------------------------------------------------------------------------ No results for input(s): TSH, T4TOTAL, T3FREE, THYROIDAB  in the last 72 hours.  Invalid input(s): FREET3 ------------------------------------------------------------------------------------------------------------------ No results for input(s): VITAMINB12, FOLATE, FERRITIN, TIBC, IRON, RETICCTPCT in the last 72 hours.  Coagulation profile No results for input(s): INR, PROTIME in the last 168 hours.  No results for input(s): DDIMER in the last 72 hours.  Cardiac Enzymes No results for input(s): CKMB, TROPONINI, MYOGLOBIN in the last 168 hours.  Invalid input(s): CK ------------------------------------------------------------------------------------------------------------------ No results found for: BNP  Inpatient Medications  Scheduled Meds: . cloNIDine  0.1 mg Oral BID  . enoxaparin (LOVENOX) injection  40 mg Subcutaneous Q24H  . feeding supplement (ENSURE ENLIVE)  237 mL Oral TID BM  . feeding supplement (PRO-STAT SUGAR FREE 64)  30 mL Oral BID  . ferrous sulfate  325 mg Oral BID WC  . folic acid  1 mg Oral Daily  . lidocaine  1 patch Transdermal Q24H  . meloxicam  15 mg Oral Daily  . multivitamin with minerals  1 tablet Oral Daily  . nicotine  21 mg Transdermal Daily  . sodium chloride flush  10-40 mL Intracatheter Q12H  . thiamine  100 mg Oral Daily  . triamcinolone ointment   Topical BID  . vancomycin  1,000 mg Intravenous Q12H   Continuous Infusions: . lactated ringers    . sodium chloride irrigation     PRN Meds:.acetaminophen **OR** acetaminophen, albuterol, hydrALAZINE, HYDROmorphone, ondansetron **OR** ondansetron (ZOFRAN) IV, oxyCODONE, polyethylene glycol, sodium chloride flush, zolpidem  Micro Results Recent Results (from the past 240 hour(s))  Culture, blood (Routine X 2) w Reflex to ID Panel     Status: None (Preliminary result)   Collection Time: 07/29/16  2:00 PM  Result Value Ref Range Status   Specimen Description BLOOD LEFT ANTECUBITAL  Final   Special Requests BOTTLES DRAWN AEROBIC ONLY 5CC  Final    Culture NO GROWTH < 24 HOURS  Final   Report Status PENDING  Incomplete  Culture, blood (Routine X 2) w Reflex to ID Panel     Status: None (Preliminary result)   Collection Time: 07/29/16  2:05 PM  Result Value Ref Range Status   Specimen Description BLOOD LEFT HAND  Final   Special Requests IN PEDIATRIC BOTTLE 2CC  Final   Culture NO GROWTH < 24 HOURS  Final   Report Status PENDING  Incomplete    Radiology Reports Dg Orthopantogram  Result Date: 07/20/2016 CLINICAL DATA:  Sepsis.  Tooth abscess. EXAM: ORTHOPANTOGRAM/PANORAMIC COMPARISON:  CT 02/18/2011. FINDINGS: No acute bony abnormality identified. No evidence of fracture or dislocation. Periapical lucencies noted throughout the remaining teeth along the mandible suggesting dental disease. No prominent lytic lesion noted. No fracture . If further evaluation is needed maxillofacial CT can be obtained. IMPRESSION: Muscle mild periapical lucencies are noted throughout the remaining teeth of the lower mandible. No prominent lytic lesion noted. Electronically Signed   By: Maisie Fus  Register   On: 07/20/2016 11:19   Dg Chest 1 View  Result Date: 07/20/2016 CLINICAL DATA:  Status post right thoracentesis. EXAM: CHEST 1 VIEW COMPARISON:  07/16/2016 FINDINGS: Right arm PICC line tip is in the projection of the cavoatrial junction. Normal heart size. Bilateral pleural effusions are identified, left greater than right. There is mild interstitial edema. Persistent bilateral mid lung and lower lobe opacities. IMPRESSION: 1. No change in bilateral pleural effusions and bilateral lung opacities. Electronically Signed   By: Signa Kell M.D.   On: 07/20/2016 11:08   Dg Chest 2 View  Result Date: 07/21/2016 CLINICAL DATA:  Sepsis. EXAM: CHEST  2 VIEW COMPARISON:  07/20/2016. FINDINGS: Right PICC line stable position. Heart size stable. Multifocal bilateral pulmonary infiltrates and bilateral pleural effusions again noted. Pulmonary infiltrates have  progressed slightly. IMPRESSION: 1.  Right PICC line stable position. 2. Progressive multifocal bilateral pulmonary infiltrates. Persistent bilateral pleural effusions. Electronically Signed   By: Maisie Fus  Register   On: 07/21/2016 12:03   Dg Chest 2 View  Result Date: 07/13/2016 CLINICAL DATA:  Pneumonia EXAM: CHEST  2 VIEW COMPARISON:  07/08/2016 FINDINGS: Bilateral pleural effusions, left greater than right with lower lobe airspace opacities, also worse on the left. This is improved somewhat on the right, not significantly changed on the left. Right PICC line is in place with the tip at the cavoatrial junction. Low lung volumes. Heart is normal size. IMPRESSION: Bilateral lower lobe airspace opacities, left greater than right. Some improvement on the right since prior study with no change on the left. Small bilateral effusions. Electronically Signed   By: Charlett Nose  M.D.   On: 07/13/2016 13:19   Dg Chest 2 View  Result Date: 07/07/2016 CLINICAL DATA:  Nausea vomiting and diarrhea for 8 days EXAM: CHEST  2 VIEW COMPARISON:  None. FINDINGS: Numerous ill-defined nodules throughout both lungs, many cavitary. No confluent consolidation. No large effusion. Hilar, mediastinal and cardiac contours are unremarkable. IMPRESSION: Innumerable nodules, many cavitary. Septic emboli are a leading consideration. Noninfectious inflammatory process or neoplastic process are less likely possibilities. Electronically Signed   By: Ellery Plunk M.D.   On: 07/07/2016 01:55   Dg Shoulder Right  Result Date: 07/18/2016 CLINICAL DATA:  Right shoulder pain with fever and sepsis. EXAM: RIGHT SHOULDER - 2+ VIEW COMPARISON:  Chest x-ray 07/16/2016 FINDINGS: Right-sided PICC line unchanged. Bones, joint spaces and soft tissues over the right shoulder are within normal. Stable changes within the right lung. IMPRESSION: Unremarkable right shoulder. Electronically Signed   By: Elberta Fortis M.D.   On: 07/18/2016 10:49   Ct  Chest W Contrast  Result Date: 07/30/2016 CLINICAL DATA:  History of IV drug abuse.  MRSA bacteremia. EXAM: CT CHEST WITH CONTRAST TECHNIQUE: Multidetector CT imaging of the chest was performed during intravenous contrast administration. CONTRAST:  75mL ISOVUE-300 IOPAMIDOL (ISOVUE-300) INJECTION 61% COMPARISON:  CT 07/19/2017 FINDINGS: Cardiovascular: Heart is normal size. Aorta is normal caliber. Mediastinum/Nodes: No mediastinal, hilar, or axillary adenopathy. Lungs/Pleura: Small to moderate bilateral pleural effusions, stable since prior study. Numerous lung nodules bilaterally, many of which are cavitary, compatible with septic emboli. These have likely slightly improved in overall size since prior study. Compressive atelectasis in the lower lobes. Upper Abdomen: Imaging into the upper abdomen shows no acute findings. Musculoskeletal: Chest wall soft tissues are unremarkable. No acute bony abnormality. IMPRESSION: Numerous bilateral pulmonary nodules, most of which are cavitary compatible with septic emboli. These may have improved slightly in overall size and number since prior study. Small bilateral pleural effusions, slightly improved since prior study. Compressive atelectasis in the lower lobes. Electronically Signed   By: Charlett Nose M.D.   On: 07/30/2016 07:18   Ct Chest W Contrast  Result Date: 07/19/2016 CLINICAL DATA:  Pneumonia, cough, fever, septic embolism, history hepatitis-C, asthma, IV drug abuse, followup EXAM: CT CHEST WITH CONTRAST TECHNIQUE: Multidetector CT imaging of the chest was performed during intravenous contrast administration. Sagittal and coronal MPR images reconstructed from axial data set. CONTRAST:  75 mL ISOVUE-300 IOPAMIDOL (ISOVUE-300) INJECTION 61% IV COMPARISON:  07/07/2016 FINDINGS: Cardiovascular: Vascular structures grossly patent on nondedicated exam. RIGHT arm PICC line with tip in SVC near cavoatrial junction. No pericardial effusion. Mediastinum/Nodes: Few  scattered normal size mediastinal lymph nodes. No definite thoracic adenopathy. Esophagus unremarkable. Base of cervical region normal appearance. Lungs/Pleura: Multiple cavitary nodules are seen within both lungs consistent with septic emboli. Number of nodules has decreased since the previous exam, with some of the smaller nodule seen previously no longer identified. Remaining nodules are stable to decreased in sizes ; RIGHT upper lobe nodule measured previously at 21 mm now measures 16 mm image 32 and previously measured LEFT upper lobe nodule of 23 mm measures 16 mm at present. BILATERAL pleural effusions with compressive atelectasis of the lower lobes. No pneumothorax. Upper Abdomen: Beam hardening artifacts from patient's RIGHT arm. Spleen appears enlarged. No additional upper abdominal abnormalities seen. Musculoskeletal: No acute osseous findings. IMPRESSION: New BILATERAL pleural effusions with compressive atelectasis of the lower lobes. Cavitary lung nodules bilaterally compatible with septic emboli, decreased in number with remaining nodules stable to decreased in sizes since  the previous exam. Electronically Signed   By: Ulyses Southward M.D.   On: 07/19/2016 13:01   Ct Angio Chest Pe W And/or Wo Contrast  Result Date: 07/07/2016 CLINICAL DATA:  Short of breath fever, tachycardia. IV drug abuse. Leukocytosis. EXAM: CT ANGIOGRAPHY CHEST CT ABDOMEN AND PELVIS WITH CONTRAST TECHNIQUE: Multidetector CT imaging of the chest was performed using the standard protocol during bolus administration of intravenous contrast. Multiplanar CT image reconstructions and MIPs were obtained to evaluate the vascular anatomy. Multidetector CT imaging of the abdomen and pelvis was performed using the standard protocol during bolus administration of intravenous contrast. CONTRAST:  100 mL Isovue COMPARISON:  Chest radiograph 07/07/2016 FINDINGS: CTA CHEST FINDINGS Cardiovascular: No filling defects within the pulmonary to suggest  acute pulmonary embolism. No acute findings of the aorta great vessels. No pericardial effusion. Heart is grossly normal. Mediastinum/Nodes: No axillary or supraclavicular adenopathy. No mediastinal hilar adenopathy. Ill-defined peribronchial thickening in the hila. Lungs/Pleura: Bilateral thick-walled cavitary nodules in the upper and lower lobes. Consolidated airspace disease in the lower lobes. Approximately 30 nodules per lung. Example nodule cavitary nodule in the RIGHT upper lobe measures 21 mm (image 27, series 6). Example cavitary nodule in the LEFT upper lobe measures 23 mm on image 30 series 6. Musculoskeletal: No aggressive osseous lesion. Review of the MIP images confirms the above findings. CT ABDOMEN and PELVIS FINDINGS Hepatobiliary: Liver is enlarged. No focal hepatic lesion. Periportal edema noted. Portal veins are patent. Gallbladder wall is edematous and thickened to 6 mm. The gallbladder lumen is contracted. Common bile duct normal caliber. Pancreas: No pancreatic inflammation or duct dilatation Spleen: Spleen is enlarged measuring 14.7 by 7.5 x 12.4 cm (volume = 720 cm^3). Low-density lesion in the spleen measuring 12 mm does not have simple fluid attenuation. Adrenals/Urinary Tract: Adrenal glands are normal. There is heterogeneous enhancement of the kidneys on the cortical phase imaging (image 32, series 2 for example). On the delayed pyelogram phase imaging the kidneys have a striated appearance (image 14 series 17). No renal obstruction. Bladder normal Stomach/Bowel: Stomach, small bowel, appendix, and cecum are normal. The colon and rectosigmoid colon are normal. Vascular/Lymphatic: Abdominal aorta is normal caliber. No abdominal lymphadenopathy. Reproductive: Uterus and ovaries grossly normal Other: Smaller free fluid in the posterior pelvis Musculoskeletal: No aggressive osseous lesion. Review of the MIP images confirms the above findings. IMPRESSION: Chest Impression: 1. Bilateral  thick-walled cavitary nodules consistent with SEPTIC PULMONARY EMBOLI in this population (young patient with IV drug use). 2. Bibasilar consolidation consistent with pneumonia Abdomen / Pelvis Impression: 1. 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 2. Hepatosplenomegaly. Low-density lesion in the spleen is indeterminate. 3. Small volume of intraperitoneal free fluid and periportal edema may relate to hepatic dysfunction. Electronically Signed   By: Genevive Bi M.D.   On: 07/07/2016 09:29   Ct Abdomen Pelvis W Contrast  Result Date: 07/07/2016 CLINICAL DATA:  Short of breath fever, tachycardia. IV drug abuse. Leukocytosis. EXAM: CT ANGIOGRAPHY CHEST CT ABDOMEN AND PELVIS WITH CONTRAST TECHNIQUE: Multidetector CT imaging of the chest was performed using the standard protocol during bolus administration of intravenous contrast. Multiplanar CT image reconstructions and MIPs were obtained to evaluate the vascular anatomy. Multidetector CT imaging of the abdomen and pelvis was performed using the standard protocol during bolus administration of intravenous contrast. CONTRAST:  100 mL Isovue COMPARISON:  Chest radiograph 07/07/2016 FINDINGS: CTA CHEST FINDINGS Cardiovascular: No filling defects within the pulmonary to suggest acute pulmonary embolism. No acute findings  of the aorta great vessels. No pericardial effusion. Heart is grossly normal. Mediastinum/Nodes: No axillary or supraclavicular adenopathy. No mediastinal hilar adenopathy. Ill-defined peribronchial thickening in the hila. Lungs/Pleura: Bilateral thick-walled cavitary nodules in the upper and lower lobes. Consolidated airspace disease in the lower lobes. Approximately 30 nodules per lung. Example nodule cavitary nodule in the RIGHT upper lobe measures 21 mm (image 27, series 6). Example cavitary nodule in the LEFT upper lobe measures 23 mm on image 30 series 6. Musculoskeletal: No aggressive osseous lesion. Review  of the MIP images confirms the above findings. CT ABDOMEN and PELVIS FINDINGS Hepatobiliary: Liver is enlarged. No focal hepatic lesion. Periportal edema noted. Portal veins are patent. Gallbladder wall is edematous and thickened to 6 mm. The gallbladder lumen is contracted. Common bile duct normal caliber. Pancreas: No pancreatic inflammation or duct dilatation Spleen: Spleen is enlarged measuring 14.7 by 7.5 x 12.4 cm (volume = 720 cm^3). Low-density lesion in the spleen measuring 12 mm does not have simple fluid attenuation. Adrenals/Urinary Tract: Adrenal glands are normal. There is heterogeneous enhancement of the kidneys on the cortical phase imaging (image 32, series 2 for example). On the delayed pyelogram phase imaging the kidneys have a striated appearance (image 14 series 17). No renal obstruction. Bladder normal Stomach/Bowel: Stomach, small bowel, appendix, and cecum are normal. The colon and rectosigmoid colon are normal. Vascular/Lymphatic: Abdominal aorta is normal caliber. No abdominal lymphadenopathy. Reproductive: Uterus and ovaries grossly normal Other: Smaller free fluid in the posterior pelvis Musculoskeletal: No aggressive osseous lesion. Review of the MIP images confirms the above findings. IMPRESSION: Chest Impression: 1. Bilateral thick-walled cavitary nodules consistent with SEPTIC PULMONARY EMBOLI in this population (young patient with IV drug use). 2. Bibasilar consolidation consistent with pneumonia Abdomen / Pelvis Impression: 1. 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 2. Hepatosplenomegaly. Low-density lesion in the spleen is indeterminate. 3. Small volume of intraperitoneal free fluid and periportal edema may relate to hepatic dysfunction. Electronically Signed   By: Genevive BiStewart  Edmunds M.D.   On: 07/07/2016 09:29   Dg Chest Port 1 View  Result Date: 07/16/2016 CLINICAL DATA:  MRSA bacteremia with septic pulmonary emboli. EXAM: PORTABLE CHEST 1  VIEW COMPARISON:  07/13/2016. FINDINGS: Right PICC tip at the superior cavoatrial junction. Normal sized heart. Mildly increased bilateral airspace opacity and bilateral pleural fluid. Unremarkable bones. IMPRESSION: Mildly increased bilateral pneumonia and pleural fluid. Electronically Signed   By: Beckie SaltsSteven  Reid M.D.   On: 07/16/2016 09:53   Dg Chest Port 1 View  Result Date: 07/08/2016 CLINICAL DATA:  Patient admitted yesterday with MR SA bacteremia. Possible aspiration today. EXAM: PORTABLE CHEST 1 VIEW COMPARISON:  CT chest and PA and lateral chest 07/07/2014. FINDINGS: Multiple nodular opacities bilaterally again seen. There is increased bibasilar airspace disease. No pneumothorax. Small pleural effusions noted. Heart size is normal. IMPRESSION: Increased bibasilar airspace disease could be due to atelectasis, aspiration and/or pneumonia. Multifocal nodular opacities compatible with septic emboli as seen on chest CT yesterday. Electronically Signed   By: Drusilla Kannerhomas  Dalessio M.D.   On: 07/08/2016 12:07   [place] Thoracentesis Asp Pleural Space W/img Guide  Result Date: 07/20/2016 INDICATION: Pneumonia, cough, fever, septic embolism, history of hepatitis-C, asthma, IV drug abuse, bilateral pleural effusions seen on CT scan. Request for diagnostic and therapeutic thoracentesis. EXAM: ULTRASOUND GUIDED RIGHT THORACENTESIS MEDICATIONS: 1% Lidocaine = 10 mL. COMPLICATIONS: None immediate. PROCEDURE: An ultrasound guided thoracentesis was thoroughly discussed with the patient and questions answered. The benefits, risks, alternatives and complications  were also discussed. The patient understands and wishes to proceed with the procedure. Written consent was obtained. Ultrasound was performed to localize and mark an adequate pocket of fluid in the right chest. The effusion appeared loculated. The area was then prepped and draped in the normal sterile fashion. 1% Lidocaine was used for local anesthesia. Under ultrasound  guidance a 6 Fr Safe-T-Centesis catheter was introduced. Thoracentesis was performed. The catheter was removed and a dressing applied. FINDINGS: A total of approximately 160 mL of blood tinged fluid was removed. Samples were sent to the laboratory as requested by the clinical team. IMPRESSION: Successful ultrasound guided right thoracentesis yielding only 160 mL of pleural fluid due to loculations. Read by:  Corrin Parker, PA-C Electronically Signed   By: Richarda Overlie M.D.   On: 07/20/2016 10:38     Jazlen Ogarro M.D on 07/30/2016 at 1:43 PM  Between 7am to 7pm - Pager - [phone]  After 7pm go to [email or web address] - password [facility]  [facility] -  Office  [phone]

## 2016-07-31 ENCOUNTER — Inpatient Hospital Stay (HOSPITAL_COMMUNITY): Payer: Self-pay

## 2016-07-31 DIAGNOSIS — M7550 Bursitis of unspecified shoulder: Secondary | ICD-10-CM

## 2016-07-31 DIAGNOSIS — M609 Myositis, unspecified: Secondary | ICD-10-CM

## 2016-07-31 DIAGNOSIS — M009 Pyogenic arthritis, unspecified: Secondary | ICD-10-CM

## 2016-07-31 DIAGNOSIS — R21 Rash and other nonspecific skin eruption: Secondary | ICD-10-CM

## 2016-07-31 LAB — BASIC METABOLIC PANEL
Anion gap: 8 (ref 5–15)
BUN: 8 mg/dL (ref 6–20)
CHLORIDE: 98 mmol/L — AB (ref 101–111)
CO2: 26 mmol/L (ref 22–32)
CREATININE: 0.68 mg/dL (ref 0.44–1.00)
Calcium: 9 mg/dL (ref 8.9–10.3)
GFR calc Af Amer: >60 mL/min (ref >60)
GFR calc non Af Amer: >60 mL/min (ref >60)
Glucose, Bld: 104 mg/dL — ABNORMAL HIGH (ref 65–99)
POTASSIUM: 4.4 mmol/L (ref 3.5–5.1)
Sodium: 132 mmol/L — ABNORMAL LOW (ref 135–145)

## 2016-07-31 LAB — CBC
HEMATOCRIT: 29.2 % — AB (ref 36.0–46.0)
HEMOGLOBIN: 8.9 g/dL — AB (ref 12.0–15.0)
MCH: 26 pg (ref 26.0–34.0)
MCHC: 30.5 g/dL (ref 30.0–36.0)
MCV: 85.4 fL (ref 78.0–100.0)
Platelets: 324 10*3/uL (ref 150–400)
RBC: 3.42 MIL/uL — AB (ref 3.87–5.11)
RDW: 16.9 % — ABNORMAL HIGH (ref 11.5–15.5)
WBC: 9.7 10*3/uL (ref 4.0–10.5)

## 2016-07-31 NOTE — Progress Notes (Signed)
Nutrition Follow-up  DOCUMENTATION CODES:   Not applicable  INTERVENTION:  Continue Ensure Enlive po TID, each supplement provides 350 kcal and 20 grams of protein.  Continue 30 ml Prostat po BID, each supplement provides 100 kcal and 15 grams of protein.    Encourage adequate PO intake.   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    OPERATIONS (1/23): 1. Multiple extraction of tooth numbers 18, 19, and 21-31 2. 2Quadrants of alveoloplasty  Meal completion has been mostly 100%. Pt is currently on a soft diet. Pt has Ensure and Prostat ordered and has been consuming them. RD to continue with current orders to aid in caloric and protein needs. Labs and medications reviewed.   Diet Order:  DIET SOFT Room service appropriate? Yes; Fluid consistency: Thin  Skin:  Wound (see comment) (Stage II to sacrum)  Last BM:  1/28  Height:   Ht Readings from Last 1 Encounters:  07/19/16 '5\' 2"'$  (1.575 m)    Weight:   Wt Readings from Last 1 Encounters:  07/27/16 119 lb 0.8 oz (54 kg)    Ideal Body Weight:  50 kg  BMI:  Body mass index is 21.77 kg/m.  Estimated Nutritional Needs:   Kcal:  1700-1900  Protein:  80-90 grams  Fluid:  1.7 - 1.9 L/day  EDUCATION NEEDS:   No education needs identified at this time  Corrin Parker, MS, RD, LDN Pager # [phone] After hours/ weekend pager # [phone]

## 2016-07-31 NOTE — Consult Note (Signed)
Reason for Consult:R shoulder pain Referring Physician: Dr. Sindy Messing Anna Colon is an 36 y.o. female.  HPI: IVDA admitted with fevers, positive for MRSA bacteremia, endocarditis, cavitary pneumonia, dental abscess, septic emboli. Now c/o R shoulder pain. Ortho consulted for eval and to r/o septic shoulder joint. She has been on IV abx- currently linezolid. Pt has already been scheduled for IR aspiration tomorrow. She is RHD. Denies other joint pains currently. Denies past hx of injury to this shoulder. Denies numbness or tingling in the arm. Reports she is unable to move the shoulder due to severe pain. Denies fever or chills right now but states they both come and go.  Past Medical History:  Diagnosis Date  . Asthma   . ETOH abuse   . Hepatitis C   . IVDU (intravenous drug user)     Past Surgical History:  Procedure Laterality Date  . CESAREAN SECTION    . MULTIPLE EXTRACTIONS WITH ALVEOLOPLASTY N/A 07/25/2016   Procedure: MULTIPLE EXTRACTION OF TOOTH #'s 18,19 and 21 thru 31  WITH ALVEOLOPLASTY;  Surgeon: Lenn Cal, DDS;  Location: [facility];  Service: Oral Surgery;  Laterality: N/A;    History reviewed. No pertinent family history.  Social History:  reports that she has been smoking.  She has been smoking about 1.00 pack per day. She has never used smokeless tobacco. She reports that she uses drugs, including IV and Cocaine. She reports that she does not drink alcohol.  Allergies: No Known Allergies  Medications: I have reviewed the patient's current medications.  Results for orders placed or performed during the hospital encounter of 07/07/16 (from the past 48 hour(s))  CBC     Status: Abnormal   Collection Time: 07/30/16  1:17 AM  Result Value Ref Range   WBC 12.8 (H) 4.0 - 10.5 K/uL   RBC 3.75 (L) 3.87 - 5.11 MIL/uL   Hemoglobin 9.8 (L) 12.0 - 15.0 g/dL   HCT 32.3 (L) 36.0 - 46.0 %   MCV 86.1 78.0 - 100.0 fL   MCH 26.1 26.0 - 34.0 pg   MCHC 30.3 30.0 - 36.0 g/dL    RDW 17.0 (H) 11.5 - 15.5 %   Platelets 376 150 - 400 K/uL  Basic metabolic panel     Status: Abnormal   Collection Time: 07/30/16  1:17 AM  Result Value Ref Range   Sodium 131 (L) 135 - 145 mmol/L   Potassium 4.8 3.5 - 5.1 mmol/L   Chloride 93 (L) 101 - 111 mmol/L   CO2 28 22 - 32 mmol/L   Glucose, Bld 122 (H) 65 - 99 mg/dL   BUN 9 6 - 20 mg/dL   Creatinine, Ser 0.69 0.44 - 1.00 mg/dL   Calcium 9.3 8.9 - 10.3 mg/dL   GFR calc non Af Amer >60 >60 mL/min   GFR calc Af Amer >60 >60 mL/min    Comment: (NOTE) The eGFR has been calculated using the CKD EPI equation. This calculation has not been validated in all clinical situations. eGFR's persistently <60 mL/min signify possible Chronic Kidney Disease.    Anion gap 10 5 - 15  Differential     Status: Abnormal   Collection Time: 07/30/16  3:16 PM  Result Value Ref Range   Neutrophils Relative % 65 %   Neutro Abs 8.6 (H) 1.7 - 7.7 K/uL   Lymphocytes Relative 20 %   Lymphs Abs 2.6 0.7 - 4.0 K/uL   Monocytes Relative 11 %   Monocytes Absolute  1.4 (H) 0.1 - 1.0 K/uL   Eosinophils Relative 3 %   Eosinophils Absolute 0.4 0.0 - 0.7 K/uL   Basophils Relative 1 %   Basophils Absolute 0.1 0.0 - 0.1 K/uL  CBC     Status: Abnormal   Collection Time: 07/31/16  4:55 AM  Result Value Ref Range   WBC 9.7 4.0 - 10.5 K/uL   RBC 3.42 (L) 3.87 - 5.11 MIL/uL   Hemoglobin 8.9 (L) 12.0 - 15.0 g/dL   HCT 29.2 (L) 36.0 - 46.0 %   MCV 85.4 78.0 - 100.0 fL   MCH 26.0 26.0 - 34.0 pg   MCHC 30.5 30.0 - 36.0 g/dL   RDW 16.9 (H) 11.5 - 15.5 %   Platelets 324 150 - 400 K/uL  Basic metabolic panel     Status: Abnormal   Collection Time: 07/31/16  4:55 AM  Result Value Ref Range   Sodium 132 (L) 135 - 145 mmol/L   Potassium 4.4 3.5 - 5.1 mmol/L   Chloride 98 (L) 101 - 111 mmol/L   CO2 26 22 - 32 mmol/L   Glucose, Bld 104 (H) 65 - 99 mg/dL   BUN 8 6 - 20 mg/dL   Creatinine, Ser 0.68 0.44 - 1.00 mg/dL   Calcium 9.0 8.9 - 10.3 mg/dL   GFR calc non  Af Amer >60 >60 mL/min   GFR calc Af Amer >60 >60 mL/min    Comment: (NOTE) The eGFR has been calculated using the CKD EPI equation. This calculation has not been validated in all clinical situations. eGFR's persistently <60 mL/min signify possible Chronic Kidney Disease.    Anion gap 8 5 - 15    Ct Chest W Contrast  Result Date: 07/30/2016 CLINICAL DATA:  History of IV drug abuse.  MRSA bacteremia. EXAM: CT CHEST WITH CONTRAST TECHNIQUE: Multidetector CT imaging of the chest was performed during intravenous contrast administration. CONTRAST:  28m ISOVUE-300 IOPAMIDOL (ISOVUE-300) INJECTION 61% COMPARISON:  CT 07/19/2017 FINDINGS: Cardiovascular: Heart is normal size. Aorta is normal caliber. Mediastinum/Nodes: No mediastinal, hilar, or axillary adenopathy. Lungs/Pleura: Small to moderate bilateral pleural effusions, stable since prior study. Numerous lung nodules bilaterally, many of which are cavitary, compatible with septic emboli. These have likely slightly improved in overall size since prior study. Compressive atelectasis in the lower lobes. Upper Abdomen: Imaging into the upper abdomen shows no acute findings. Musculoskeletal: Chest wall soft tissues are unremarkable. No acute bony abnormality. IMPRESSION: Numerous bilateral pulmonary nodules, most of which are cavitary compatible with septic emboli. These may have improved slightly in overall size and number since prior study. Small bilateral pleural effusions, slightly improved since prior study. Compressive atelectasis in the lower lobes. Electronically Signed   By: KRolm BaptiseM.D.   On: 07/30/2016 07:18   Mr Shoulder Right W Wo Contrast  Result Date: 07/30/2016 CLINICAL DATA:  Fever and right shoulder pain. Septic emboli. IV drug abuse. MRSA bacteremia. EXAM: MRI OF THE RIGHT SHOULDER WITHOUT AND WITH CONTRAST TECHNIQUE: Multiplanar, multisequence MR imaging of the right shoulder was performed before and after the administration of  intravenous contrast. CONTRAST:  165mMULTIHANCE GADOBENATE DIMEGLUMINE 529 MG/ML IV SOLN COMPARISON:  07/29/2016 CT chest FINDINGS: Despite efforts by the technologist and patient, motion artifact is present on today's exam and could not be eliminated. This reduces exam sensitivity and specificity. Rotator cuff:  Moderate distal supraspinatus tendinopathy. Muscles: Abnormal edema and enhancement in the supraspinatus muscle and tracking around surrounding fascia planes. Mild enhancement within along  the infraspinatus muscle especially at its margins. Biceps long head:  Unremarkable Acromioclavicular Joint: No significant arthropathy. Type I acromion. There is abnormal edema and enhancement in the subacromial subdeltoid bursa compatible with synovitis, and possibly infection. Glenohumeral Joint: Trace glenohumeral joint effusion, I am skeptical of overt septic joint as there is only thin marginal enhancement along the synovial lining. Articular cartilage maintained. Labrum:  Grossly unremarkable Bones: No significant extra-articular osseous abnormalities identified. No findings of active osteomyelitis in the shoulder. Other: Scattered nodules in the lungs compatible with patient's known septic emboli. Reactive lymph nodes in the axilla. IMPRESSION: 1. Subacromial subdeltoid bursitis with fairly thick synovitis, suspicious for infection in the subacromial subdeltoid bursa. There is some fluid extending in the rotator interval but I am skeptical of septic glenohumeral joint. 2. Enhancement in the supraspinatus and to a lesser extent infraspinatus muscles, suspicious for myositis. 3. Nodules in the lungs likely from septic emboli. 4. No osteomyelitis identified. Electronically Signed   By: Van Clines M.D.   On: 07/30/2016 15:48    Review of Systems  Constitutional: Positive for malaise/fatigue.  HENT: Negative.   Eyes: Negative.   Respiratory: Negative.   Cardiovascular: Negative.   Gastrointestinal:  Negative.   Genitourinary: Negative.   Musculoskeletal: Positive for joint pain.  Neurological: Positive for weakness.  Psychiatric/Behavioral: Negative.    Blood pressure (!) 99/57, pulse 88, temperature 98.5 F (36.9 C), temperature source Oral, resp. rate 17, height _0  (1.575 m), weight 54 kg (119 lb 0.8 oz), last menstrual period 05/16/2016, SpO2 98 %. Physical Exam  Constitutional: She is oriented to person, place, and time. She appears well-developed.  HENT:  Head: Normocephalic.  Eyes: Pupils are equal, round, and reactive to light.  Neck: Normal range of motion.  Respiratory: Effort normal.  GI: Soft.  Musculoskeletal:  R shoulder ttp anterior subacromial space and upper aspect of deltoid with visible swelling noted  No erythema Nontender through trapezius, clavicle, AC joint, posterior shoulder Globally decreased ROM both passively and actively limited by pain Sensation intact distally Able to flex/extend fingers/wrist/elbow   Neurological: She is alert and oriented to person, place, and time.   MRI R shoulder with bursitis, fluid in subacromial space, concern for infection. Suspicion of myositis supraspinatus and infraspinatus. No osteomyelitis. Septic emboli.   Assessment/Plan: R shoulder pain, possible septic shoulder  IR to aspirate R glenohumeral joint tomorrow - send fluid for STAT cell count with diff, crystal ID, and culture Keep NPO after MN tomorrow for possible I&D pending aspirate results Noted that pt has been on abx per ID Dr. Lyla Glassing discussed with Dr. Stann Mainland - Dr. Stann Mainland to follow aspirate results tomorrow and comment regarding further recommendations.  Anna Colon M. for Dr. Lyla Glassing 07/31/2016, 5:28 PM

## 2016-07-31 NOTE — Progress Notes (Addendum)
PROGRESS NOTE    Anna Colon  ZOX:[id number] DOB: August 22, 1980 DOA: 07/07/2016 PCP: No PCP Per Patient   Brief Narrative: 36 year old female with history of IV drug abuse, recently released from jail, admitted with fevers, workup significant for MRSA bacteremia, cavitary pneumonia, tricuspid endocarditis, dental abscess. Seen by ID with recommendation of IV vancomycin for total of 6 weeks, status post extraction of multiple teeth extraction, had PICC line inserted, continued to spike fever.  Assessment & Plan:   # Sepsis: Secondary to MRSA bacteremia, TV endocarditis, septic pulmonary emboli and possibly right shoulder septic joint. -  Patient with IV drug abuse which is probably the cause of sepsis. Patient is afebrile for 24 hours now. MRI of the right shoulder consistent with possible septic arthritis. Patient is currently on IV linezolid as per infectious disease. Orthopedic consult called to evaluated the patient. Due to IV drug abuse, repeat blood cultures are negative, antibiotics management per ID, recommendation to continue with antibiotics for total of 6 weeks, until February 19. - CT chest consistent with improvement in bilateral cavitary lesions compatible with septic emboli. - Continues to use incentive spirometry - Most recent blood cultures repeated on 1/27, no growth to date, follow up final culture results.  # Possible right shoulder septic joint: Ortho consulted. Advised me to consult IR for joint aspiration and to send fluid for study. Already on abx. Will order IR procedure.   # Dental abscess -  had her teeth pulled giving dental abscess.  # Pleural effusion - Status post ultrasound-guided thoracentesis 1/18 - Repeat CT chest 1/29 with slight improvement of septic emboli and effusion.  # History of substance abuse - She is counseled - decreased Dilaudid dose to 2 mg every 4 hours.  # Tobacco abuse - Counseled on cessation. nicotine patch.  #ETOH abuse -  No signs of withdrawal. Monitor on CIWA. Added low dose clonidine  # Iron deficiency anemia - continue oral iron. 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 Outpatient follow up.  # Sacral pressure ulcer - Wound care consulted  - patient encouraged to ambulate, and use incentive spirometry  Principal Problem:   MRSA bacteremia Active Problems:   Sepsis (HCC)   Cavitary pneumonia   IVDU (intravenous drug user)   Cigarette smoker   ETOH abuse   Hepatitis C antibody test positive   Normocytic anemia   Thrombocytopenia (HCC)   Endocarditis of tricuspid valve   Acute septic pulmonary embolism without acute cor pulmonale (HCC)   Sepsis due to methicillin resistant Staphylococcus aureus (MRSA) (HCC)   Pressure injury of skin   Acute pain of right shoulder   Septic embolism (HCC)  DVT prophylaxis: Lovenox subcutaneous Code Status: Full code Family Communication: No family present at bedside Disposition Plan: Likely discharge to rehabilitation in 3-4 days depending on clinical improvement.    Consultants:   Infectious disease  CT surgery  Orthopedics  Procedures: Teeth extraction 1/23 by Dr. Kristin Bruins Antimicrobials: IV linezolid now. Was on vancomycin and Zosyn. Subjective: Patient was seen and examined at bedside. Feels weak. Patient is a stable and improved with the current medication. Denied nausea vomiting chest pain or shortness of breath.  Objective: Vitals:   07/30/16 1809 07/30/16 2056 07/31/16 0519 07/31/16 0950  BP: 108/68 110/68 99/67 114/73  Pulse: 100 98 79 (!) 101  Resp: 18 16 16 17   Temp: 99.3 F (37.4 C) 99.2 F (37.3 C) 98.6 F (37 C) 98 F (36.7 C)  TempSrc: Oral  Oral Oral Oral  SpO2: 98% 98% 99% 100%  Weight:      Height:        Intake/Output Summary (Last 24 hours) at 07/31/16 1321 Last data filed at 07/31/16 0951  Gross per 24 hour  Intake          4208.75 ml  Output              650 ml  Net          3558.75 ml    Filed Weights   07/23/16 0400 07/23/16 2212 07/27/16 2103  Weight: 54.6 kg (120 lb 6.4 oz) 54.6 kg (120 lb 4.8 oz) 54 kg (119 lb 0.8 oz)    Examination:  General exam: Ill-looking female lying in bed, Appears calm and comfortable  Respiratory system: Clear to auscultation. Respiratory effort normal. No wheezing or crackle Cardiovascular system: S1 & S2 heard, RRR.  No pedal edema. Gastrointestinal system: Abdomen is nondistended, soft and nontender. Normal bowel sounds heard. Central nervous system: Alert and oriented. No focal neurological deficits. Extremities: restricted movement of RUE because of joint pain. No LE edema. Skin: No rashes, lesions or ulcers Psychiatry: Judgement and insight appear normal. Mood & affect appropriate.     Data Reviewed: I have personally reviewed following labs and imaging studies  CBC:  Recent Labs Lab 07/29/16 0835 07/30/16 0117 07/30/16 1516 07/31/16 0455  WBC 11.8* 12.8*  --  9.7  NEUTROABS  --   --  8.6*  --   HGB 9.8* 9.8*  --  8.9*  HCT 32.4* 32.3*  --  29.2*  MCV 87.1 86.1  --  85.4  PLT 364 376  --  324   Basic Metabolic Panel:  Recent Labs Lab 07/25/16 0425 07/28/16 0452 07/30/16 0117 07/31/16 0455  NA 134* 133* 131* 132*  K 4.6 5.1 4.8 4.4  CL 98* 96* 93* 98*  CO2 27 29 28 26   GLUCOSE 89 100* 122* 104*  BUN 9 13 9 8   CREATININE 0.67 0.75 0.69 0.68  CALCIUM 8.9 9.2 9.3 9.0   GFR: Estimated Creatinine Clearance: 76.2 mL/min (by C-G formula based on SCr of 0.68 mg/dL). Liver Function Tests: No results for input(s): AST, ALT, ALKPHOS, BILITOT, PROT, ALBUMIN in the last 168 hours. No results for input(s): LIPASE, AMYLASE in the last 168 hours. No results for input(s): AMMONIA in the last 168 hours. Coagulation Profile: No results for input(s): INR, PROTIME in the last 168 hours. Cardiac Enzymes: No results for input(s): CKTOTAL, CKMB, CKMBINDEX, TROPONINI in the last 168 hours. BNP (last 3 results) No results  for input(s): PROBNP in the last 8760 hours. HbA1C: No results for input(s): HGBA1C in the last 72 hours. CBG: No results for input(s): GLUCAP in the last 168 hours. Lipid Profile: No results for input(s): CHOL, HDL, LDLCALC, TRIG, CHOLHDL, LDLDIRECT in the last 72 hours. Thyroid Function Tests: No results for input(s): TSH, T4TOTAL, FREET4, T3FREE, THYROIDAB in the last 72 hours. Anemia Panel: No results for input(s): VITAMINB12, FOLATE, FERRITIN, TIBC, IRON, RETICCTPCT in the last 72 hours. Sepsis Labs: No results for input(s): PROCALCITON, LATICACIDVEN in the last 168 hours.  Recent Results (from the past 240 hour(s))  Culture, blood (Routine X 2) w Reflex to ID Panel     Status: None (Preliminary result)   Collection Time: 07/29/16  2:00 PM  Result Value Ref Range Status   Specimen Description BLOOD LEFT ANTECUBITAL  Final   Special Requests BOTTLES DRAWN AEROBIC ONLY 5CC  Final   Culture NO GROWTH < 24 HOURS  Final   Report Status PENDING  Incomplete  Culture, blood (Routine X 2) w Reflex to ID Panel     Status: None (Preliminary result)   Collection Time: 07/29/16  2:05 PM  Result Value Ref Range Status   Specimen Description BLOOD LEFT HAND  Final   Special Requests IN PEDIATRIC BOTTLE 2CC  Final   Culture NO GROWTH < 24 HOURS  Final   Report Status PENDING  Incomplete         Radiology Studies: Ct Chest W Contrast  Result Date: 07/30/2016 CLINICAL DATA:  History of IV drug abuse.  MRSA bacteremia. EXAM: CT CHEST WITH CONTRAST TECHNIQUE: Multidetector CT imaging of the chest was performed during intravenous contrast administration. CONTRAST:  75mL ISOVUE-300 IOPAMIDOL (ISOVUE-300) INJECTION 61% COMPARISON:  CT 07/19/2017 FINDINGS: Cardiovascular: Heart is normal size. Aorta is normal caliber. Mediastinum/Nodes: No mediastinal, hilar, or axillary adenopathy. Lungs/Pleura: Small to moderate bilateral pleural effusions, stable since prior study. Numerous lung nodules  bilaterally, many of which are cavitary, compatible with septic emboli. These have likely slightly improved in overall size since prior study. Compressive atelectasis in the lower lobes. Upper Abdomen: Imaging into the upper abdomen shows no acute findings. Musculoskeletal: Chest wall soft tissues are unremarkable. No acute bony abnormality. IMPRESSION: Numerous bilateral pulmonary nodules, most of which are cavitary compatible with septic emboli. These may have improved slightly in overall size and number since prior study. Small bilateral pleural effusions, slightly improved since prior study. Compressive atelectasis in the lower lobes. Electronically Signed   By: Charlett NoseKevin  Dover M.D.   On: 07/30/2016 07:18   Mr Shoulder Right W Wo Contrast  Result Date: 07/30/2016 CLINICAL DATA:  Fever and right shoulder pain. Septic emboli. IV drug abuse. MRSA bacteremia. EXAM: MRI OF THE RIGHT SHOULDER WITHOUT AND WITH CONTRAST TECHNIQUE: Multiplanar, multisequence MR imaging of the right shoulder was performed before and after the administration of intravenous contrast. CONTRAST:  10mL MULTIHANCE GADOBENATE DIMEGLUMINE 529 MG/ML IV SOLN COMPARISON:  07/29/2016 CT chest FINDINGS: Despite efforts by the technologist and patient, motion artifact is present on today's exam and could not be eliminated. This reduces exam sensitivity and specificity. Rotator cuff:  Moderate distal supraspinatus tendinopathy. Muscles: Abnormal edema and enhancement in the supraspinatus muscle and tracking around surrounding fascia planes. Mild enhancement within along the infraspinatus muscle especially at its margins. Biceps long head:  Unremarkable Acromioclavicular Joint: No significant arthropathy. Type I acromion. There is abnormal edema and enhancement in the subacromial subdeltoid bursa compatible with synovitis, and possibly infection. Glenohumeral Joint: Trace glenohumeral joint effusion, I am skeptical of overt septic joint as there is only  thin marginal enhancement along the synovial lining. Articular cartilage maintained. Labrum:  Grossly unremarkable Bones: No significant extra-articular osseous abnormalities identified. No findings of active osteomyelitis in the shoulder. Other: Scattered nodules in the lungs compatible with patient's known septic emboli. Reactive lymph nodes in the axilla. IMPRESSION: 1. Subacromial subdeltoid bursitis with fairly thick synovitis, suspicious for infection in the subacromial subdeltoid bursa. There is some fluid extending in the rotator interval but I am skeptical of septic glenohumeral joint. 2. Enhancement in the supraspinatus and to a lesser extent infraspinatus muscles, suspicious for myositis. 3. Nodules in the lungs likely from septic emboli. 4. No osteomyelitis identified. Electronically Signed   By: Gaylyn RongWalter  Liebkemann M.D.   On: 07/30/2016 15:48        Scheduled Meds: . cloNIDine  0.1 mg Oral BID  .  enoxaparin (LOVENOX) injection  40 mg Subcutaneous Q24H  . feeding supplement (ENSURE ENLIVE)  237 mL Oral TID BM  . feeding supplement (PRO-STAT SUGAR FREE 64)  30 mL Oral BID  . ferrous sulfate  325 mg Oral BID WC  . folic acid  1 mg Oral Daily  . hydrocortisone cream   Topical BID  . lidocaine  1 patch Transdermal Q24H  . linezolid (ZYVOX) IV  600 mg Intravenous Q12H  . meloxicam  15 mg Oral Daily  . multivitamin with minerals  1 tablet Oral Daily  . nicotine  21 mg Transdermal Daily  . sodium chloride flush  10-40 mL Intracatheter Q12H  . thiamine  100 mg Oral Daily  . triamcinolone ointment   Topical BID   Continuous Infusions: . sodium chloride 75 mL/hr at 07/31/16 0300  . lactated ringers       LOS: 24 days    Calvert Charland Jaynie Collins, MD [facility] Pager [phone]  If 7PM-7AM, please contact night-coverage [email or web address] Password [email or web address] 07/31/2016, 1:21 PM

## 2016-07-31 NOTE — Progress Notes (Signed)
    [facility]   Reason for visit: Follow up on fever  Interval History: developed fever and shoulder pain and MRI concerning for septic arthritis.  Worried about infection.  Rash improved and now on linezolid.   MRI independently reviewed   Physical Exam: Constitutional:  Vitals:   07/31/16 0519 07/31/16 0950  BP: 99/67 114/73  Pulse: 79 (!) 101  Resp: 16 17  Temp: 98.6 F (37 C) 98 F (36.7 C)   patient appears anxious Eyes: anicteric HENT: no thrush Respiratory: Normal respiratory effort; CTA B Cardiovascular: RRR GI: soft, nt, nd  Review of Systems: Constitutional: negative for fevers and chills Gastrointestinal: negative for diarrhea Integument/breast: negative for pruritus  Lab Results  Component Value Date   WBC 9.7 07/31/2016   HGB 8.9 (L) 07/31/2016   HCT 29.2 (L) 07/31/2016   MCV 85.4 07/31/2016   PLT 324 07/31/2016    Lab Results  Component Value Date   CREATININE 0.68 07/31/2016   BUN 8 07/31/2016   NA 132 (L) 07/31/2016   K 4.4 07/31/2016   CL 98 (L) 07/31/2016   CO2 26 07/31/2016    Lab Results  Component Value Date   ALT 19 07/08/2016   AST 31 07/08/2016   ALKPHOS 93 07/08/2016     Microbiology: Recent Results (from the past 240 hour(s))  Culture, blood (Routine X 2) w Reflex to ID Panel     Status: None (Preliminary result)   Collection Time: 07/29/16  2:00 PM  Result Value Ref Range Status   Specimen Description BLOOD LEFT ANTECUBITAL  Final   Special Requests BOTTLES DRAWN AEROBIC ONLY 5CC  Final   Culture NO GROWTH < 24 HOURS  Final   Report Status PENDING  Incomplete  Culture, blood (Routine X 2) w Reflex to ID Panel     Status: None (Preliminary result)   Collection Time: 07/29/16  2:05 PM  Result Value Ref Range Status   Specimen Description BLOOD LEFT HAND  Final   Special Requests IN PEDIATRIC BOTTLE 2CC  Final   Culture NO GROWTH < 24 HOURS  Final   Report Status PENDING  Incomplete     Impression/Plan:  1. Fever - improved now. On linezolid.  Not an ideal treatment but will continue for now.  May need to convert to IV daptomycin.    2. Bursitis and myositis - concern for infection.  For aspiration tomorrow by IR per nursing.   3.  Bacteremia - repeat blood cultures sent 1/27 and remain ngtd.

## 2016-08-01 ENCOUNTER — Inpatient Hospital Stay (HOSPITAL_COMMUNITY): Payer: Self-pay

## 2016-08-01 DIAGNOSIS — M25511 Pain in right shoulder: Secondary | ICD-10-CM

## 2016-08-01 DIAGNOSIS — Z9889 Other specified postprocedural states: Secondary | ICD-10-CM

## 2016-08-01 MED ORDER — HYDROMORPHONE HCL 1 MG/ML IJ SOLN
1.0000 mg | INTRAMUSCULAR | Status: DC | PRN
  Administered 2016-08-01 – 2016-08-03 (×10): 1 mg via INTRAVENOUS
  Filled 2016-08-01 (×10): qty 1

## 2016-08-01 MED ORDER — LIDOCAINE HCL (PF) 1 % IJ SOLN
10.0000 mL | Freq: Once | INTRAMUSCULAR | Status: AC
  Administered 2016-08-01: 10 mL via INTRADERMAL

## 2016-08-01 MED ORDER — SODIUM CHLORIDE 0.9 % IJ SOLN: 10.0000 mL | Freq: Once | INTRAMUSCULAR | Status: DC

## 2016-08-01 MED ORDER — VANCOMYCIN HCL IN DEXTROSE 1-5 GM/200ML-% IV SOLN
1000.0000 mg | Freq: Two times a day (BID) | INTRAVENOUS | Status: DC
  Administered 2016-08-02 – 2016-08-03 (×4): 1000 mg via INTRAVENOUS
  Filled 2016-08-01 (×5): qty 200

## 2016-08-01 MED ORDER — IOPAMIDOL (ISOVUE-M 200) INJECTION 41%: 20.0000 mL | Freq: Once | INTRAMUSCULAR | Status: DC

## 2016-08-01 MED ORDER — VANCOMYCIN HCL IN DEXTROSE 1-5 GM/200ML-% IV SOLN
1000.0000 mg | Freq: Once | INTRAVENOUS | Status: AC
  Administered 2016-08-01: 1000 mg via INTRAVENOUS
  Filled 2016-08-01: qty 200

## 2016-08-01 NOTE — Progress Notes (Signed)
Pharmacy Antibiotic Note Anna Colon is a 36 y.o. female with history of IV drug abuse released from jail 07/06/16 who presents with nausea, vomiting, and fever (tmax 104), and chills admitted on 07/07/2016 and found to have MRSA bacteremia and endocarditis with septic emboli. Pt maintained on vancomycin from 1/5 until 1/28 when pt developed pelvic rash and was transitioned to Linezolid. Dr. Luciana Axeomer ask that pharmacy to restart vancomycin.      Plan: 1. Restart vancomycin 1 gram IV every 12 hours will infuse over 2 hours to see if helps with rash that was previously noted while on vancomycin  [facility]. [facility]; goal trough 15-20 3. SCr every 72 hours while on vancomycin   Height: 5\' 2"  (157.5 cm) Weight: 130 lb (59 kg) IBW/kg (Calculated) : 50.1  Temp (24hrs), Avg:98.8 F (37.1 C), Min:98.5 F (36.9 C), Max:99.2 F (37.3 C)   Recent Labs Lab 07/25/16 1837 07/28/16 0452 07/29/16 0835 07/30/16 0117 07/31/16 0455  WBC  --   --  11.8* 12.8* 9.7  CREATININE  --  0.75  --  0.69 0.68  VANCOTROUGH 8*  --  22*  --   --     Estimated Creatinine Clearance: 76.2 mL/min (by C-G formula based on SCr of 0.68 mg/dL).    No Known Allergies  Antimicrobials this admission:  Vanc 1/5 >> 1/28 -developed puritic rash on torso 1/29 >>  Linezolid 1/28>> (2/19) Zosyn 1/5 >> 1/5   Dose adjustments this admission:  1/6 VT 15 - on vanc 750 mg IV q 8 hrs 1/8 VT 18 - on vanc 1g IV q 8 hrs 1/12 VT 24 drawn from PICC line 1.5hr early - will leave dose alone for now 1/16 @ 0415 measured VT @30  mcg/ml (only 5 hours after evening dose) so corrects to ~24 mcg/ml >> will reduce to 750 mg/8h 1/18 VT 16 >> continue Vanc 750 mg/8h 1/21 VT 18 >> continue vanc 750mg /8h 1/23 VT 8 (AM dose got held for dental procedure)-not trough 1/25 VT 22 >> change vanc to 1g/q12h - changed to q12h dosing to help at discharge. Estimated ke 0.0758  Microbiology results:  1/5 BCx: 2/2 MRSA 1/5 BCID - MRSA 1/5 UCx:  neg 1/5 MRSA PCR: (+) 1/6 resp panel: neg 1/6 HCV Ab: 11, viral load not calc- spontaneous clearing per ID 1/6 BCx: 1/2 MRSA 1/7 BCx:1/2 staph aureus 1/9 Bcx: neg 1/18 peritoneal fluid: neg 1/27 BCx: ngtd 1/30 shoulder aspiration  Thank you for allowing pharmacy to be a part of this patient's care.  Pollyann SamplesAndy Shaivi Rothschild, PharmD, BCPS 08/01/2016, 4:02 PM

## 2016-08-01 NOTE — Progress Notes (Signed)
PROGRESS NOTE    Anna Colon  ZOX:[id number] DOB: 1981-05-18 DOA: 07/07/2016 PCP: No PCP Per Patient   Brief Narrative: 36 year old female with history of IV drug abuse, recently released from jail, admitted with fevers, workup significant for MRSA bacteremia, cavitary pneumonia, tricuspid endocarditis, dental abscess. Seen by ID with recommendation of IV vancomycin for total of 6 weeks, status post extraction of multiple teeth extraction, had PICC line inserted, continued to spike fever.  Assessment & Plan:   # Sepsis: Secondary to MRSA bacteremia, TV endocarditis, septic pulmonary emboli and possibly right shoulder septic joint. -  Patient with IV drug abuse which is probably the cause of sepsis. Patient is afebrile now. MRI of the right shoulder consistent with possible septic arthritis. Patient is currently on IV linezolid as per infectious disease. Orthopedic consult appreciated. - CT chest consistent with improvement in bilateral cavitary lesions compatible with septic emboli. - Continues to use incentive spirometry - Most recent blood cultures repeated on 1/27, no growth to date, follow up final culture results.  # Possible right shoulder septic joint: Ortho consult appreciated.  -s/p IR attempt to aspirate the right shoulder joint, it was unsuccessful. Pt with worsening pain, added IV dilaudid for breakthrough pain.  # Dental abscess -  had her teeth pulled giving dental abscess.  # Pleural effusion - Status post ultrasound-guided thoracentesis 1/18 - Repeat CT chest 1/29 with slight improvement of septic emboli and effusion.  # History of substance abuse - She is counseled - decreased Dilaudid dose to 2 mg every 4 hours.  # Tobacco abuse - Counseled on cessation. nicotine patch.  #ETOH abuse - No signs of withdrawal. Monitor on CIWA. Added low dose clonidine  # Iron deficiency anemia - continue oral iron. Monitor CBC  # Hepatitis C - Secondary to IV drug  use. Outpatient follow up.  # Sacral pressure ulcer - Wound care consulted  - patient encouraged to ambulate, and use incentive spirometry  Principal Problem:   MRSA bacteremia Active Problems:   Sepsis (HCC)   Cavitary pneumonia   IVDU (intravenous drug user)   Cigarette smoker   ETOH abuse   Hepatitis C antibody test positive   Normocytic anemia   Thrombocytopenia (HCC)   Endocarditis of tricuspid valve   Acute septic pulmonary embolism without acute cor pulmonale (HCC)   Sepsis due to methicillin resistant Staphylococcus aureus (MRSA) (HCC)   Pressure injury of skin   Acute pain of right shoulder   Septic embolism (HCC)   Pyogenic arthritis of right shoulder region [facility])  DVT prophylaxis: Lovenox subcutaneous Code Status: Full code Family Communication: Pt's friend at bedside. Disposition Plan: Likely discharge to rehabilitation in 3-4 days depending on clinical improvement.    Consultants:   Infectious disease  CT surgery  Orthopedics  Procedures: Teeth extraction 1/23 by Dr. Kristin Bruins Antimicrobials: IV linezolid since 07/30/2016. Was on vancomycin and Zosyn.  Subjective: Patient was seen and examined at bedside. Patient reported right shoulder pain. Denied fever, chills, headache, dizziness, chest pain or shortness of breath.  Objective: Vitals:   07/31/16 1644 07/31/16 2058 08/01/16 0442 08/01/16 1033  BP: (!) 99/57 103/73 107/80 108/65  Pulse: 88 83 85 81  Resp: 17 18 18 18   Temp: 98.5 F (36.9 C) 98.7 F (37.1 C) 99.2 F (37.3 C) 98.9 F (37.2 C)  TempSrc: Oral   Oral  SpO2: 98% 91% 92% 98%  Weight:  59 kg (130 lb)    Height:  Intake/Output Summary (Last 24 hours) at 08/01/16 1451 Last data filed at 08/01/16 1421  Gross per 24 hour  Intake           2757.5 ml  Output              400 ml  Net           2357.5 ml   Filed Weights   07/23/16 2212 07/27/16 2103 07/31/16 2058  Weight: 54.6 kg (120 lb 4.8 oz) 54 kg (119 lb 0.8 oz) 59  kg (130 lb)    Examination:  General exam: Ill-looking female lying on bed, not in distress Respiratory system: Clear bilaterally. Respiratory effort normal.  Cardiovascular system: S1 & S2 heard, RRR.  No pedal edema. Gastrointestinal system: Abdomen is nondistended, soft and nontender. Normal bowel sounds heard. Central nervous system: Alert and oriented. No focal neurological deficits. Extremities: restricted movement of RUE because of joint pain. No LE edema. Skin: No rashes, lesions or ulcers Psychiatry: Judgement and insight appear normal. Mood & affect appropriate.     Data Reviewed: I have personally reviewed following labs and imaging studies  CBC:  Recent Labs Lab 07/29/16 0835 07/30/16 0117 07/30/16 1516 07/31/16 0455  WBC 11.8* 12.8*  --  9.7  NEUTROABS  --   --  8.6*  --   HGB 9.8* 9.8*  --  8.9*  HCT 32.4* 32.3*  --  29.2*  MCV 87.1 86.1  --  85.4  PLT 364 376  --  324   Basic Metabolic Panel:  Recent Labs Lab 07/28/16 0452 07/30/16 0117 07/31/16 0455  NA 133* 131* 132*  K 5.1 4.8 4.4  CL 96* 93* 98*  CO2 29 28 26   GLUCOSE 100* 122* 104*  BUN 13 9 8   CREATININE 0.75 0.69 0.68  CALCIUM 9.2 9.3 9.0   GFR: Estimated Creatinine Clearance: 76.2 mL/min (by C-G formula based on SCr of 0.68 mg/dL). Liver Function Tests: No results for input(s): AST, ALT, ALKPHOS, BILITOT, PROT, ALBUMIN in the last 168 hours. No results for input(s): LIPASE, AMYLASE in the last 168 hours. No results for input(s): AMMONIA in the last 168 hours. Coagulation Profile: No results for input(s): INR, PROTIME in the last 168 hours. Cardiac Enzymes: No results for input(s): CKTOTAL, CKMB, CKMBINDEX, TROPONINI in the last 168 hours. BNP (last 3 results) No results for input(s): PROBNP in the last 8760 hours. HbA1C: No results for input(s): HGBA1C in the last 72 hours. CBG: No results for input(s): GLUCAP in the last 168 hours. Lipid Profile: No results for input(s): CHOL,  HDL, LDLCALC, TRIG, CHOLHDL, LDLDIRECT in the last 72 hours. Thyroid Function Tests: No results for input(s): TSH, T4TOTAL, FREET4, T3FREE, THYROIDAB in the last 72 hours. Anemia Panel: No results for input(s): VITAMINB12, FOLATE, FERRITIN, TIBC, IRON, RETICCTPCT in the last 72 hours. Sepsis Labs: No results for input(s): PROCALCITON, LATICACIDVEN in the last 168 hours.  Recent Results (from the past 240 hour(s))  Culture, blood (Routine X 2) w Reflex to ID Panel     Status: None (Preliminary result)   Collection Time: 07/29/16  2:00 PM  Result Value Ref Range Status   Specimen Description BLOOD LEFT ANTECUBITAL  Final   Special Requests BOTTLES DRAWN AEROBIC ONLY 5CC  Final   Culture NO GROWTH 2 DAYS  Final   Report Status PENDING  Incomplete  Culture, blood (Routine X 2) w Reflex to ID Panel     Status: None (Preliminary result)   Collection Time:  07/29/16  2:05 PM  Result Value Ref Range Status   Specimen Description BLOOD LEFT HAND  Final   Special Requests IN PEDIATRIC BOTTLE 2CC  Final   Culture NO GROWTH 2 DAYS  Final   Report Status PENDING  Incomplete         Radiology Studies: Dg Fluoro Rm 1-60 Min  Result Date: 08/01/2016 CLINICAL DATA:  Subacromial/subdeltoid bursitis. EXAM: FLOURO RM 1-60 MIN COMPARISON:  MRI right shoulder 07/30/2016 FINDINGS: Written informed consent was obtained. The procedure was explained in detail including the risks of bleeding, infection and damage to normal structures. An appropriate time-out was performed before the procedure. Upright treated site for subacromial bursal aspiration with marked on the patient's skin and the patient was prepped and draped in the usual sterile fashion. Local anesthesia was performed along the posterior and distal aspect of the acromion. A 21 gauge needle was then inserted in the subacromial/subdeltoid bursa and injection of 1% xylocaine showed no resistance. However, I was unable to aspirate any fluid from the  bursa. I made a second attempt laterally but again no fluid was aspirated. Reviewing the MRI there appears to be severe inflammation of the bursa with a small amount of fluid suspected but mainly enhancing inflammatory tissue/phlegmon. IMPRESSION: Unsuccessful aspiration of the right subacromial/subdeltoid bursa. Electronically Signed   By: Rudie Meyer M.D.   On: 08/01/2016 14:43   Mr Shoulder Right W Wo Contrast  Result Date: 07/30/2016 CLINICAL DATA:  Fever and right shoulder pain. Septic emboli. IV drug abuse. MRSA bacteremia. EXAM: MRI OF THE RIGHT SHOULDER WITHOUT AND WITH CONTRAST TECHNIQUE: Multiplanar, multisequence MR imaging of the right shoulder was performed before and after the administration of intravenous contrast. CONTRAST:  10mL MULTIHANCE GADOBENATE DIMEGLUMINE 529 MG/ML IV SOLN COMPARISON:  07/29/2016 CT chest FINDINGS: Despite efforts by the technologist and patient, motion artifact is present on today's exam and could not be eliminated. This reduces exam sensitivity and specificity. Rotator cuff:  Moderate distal supraspinatus tendinopathy. Muscles: Abnormal edema and enhancement in the supraspinatus muscle and tracking around surrounding fascia planes. Mild enhancement within along the infraspinatus muscle especially at its margins. Biceps long head:  Unremarkable Acromioclavicular Joint: No significant arthropathy. Type I acromion. There is abnormal edema and enhancement in the subacromial subdeltoid bursa compatible with synovitis, and possibly infection. Glenohumeral Joint: Trace glenohumeral joint effusion, I am skeptical of overt septic joint as there is only thin marginal enhancement along the synovial lining. Articular cartilage maintained. Labrum:  Grossly unremarkable Bones: No significant extra-articular osseous abnormalities identified. No findings of active osteomyelitis in the shoulder. Other: Scattered nodules in the lungs compatible with patient's known septic emboli.  Reactive lymph nodes in the axilla. IMPRESSION: 1. Subacromial subdeltoid bursitis with fairly thick synovitis, suspicious for infection in the subacromial subdeltoid bursa. There is some fluid extending in the rotator interval but I am skeptical of septic glenohumeral joint. 2. Enhancement in the supraspinatus and to a lesser extent infraspinatus muscles, suspicious for myositis. 3. Nodules in the lungs likely from septic emboli. 4. No osteomyelitis identified. Electronically Signed   By: Gaylyn Rong M.D.   On: 07/30/2016 15:48        Scheduled Meds: . cloNIDine  0.1 mg Oral BID  . enoxaparin (LOVENOX) injection  40 mg Subcutaneous Q24H  . feeding supplement (ENSURE ENLIVE)  237 mL Oral TID BM  . feeding supplement (PRO-STAT SUGAR FREE 64)  30 mL Oral BID  . ferrous sulfate  325 mg Oral BID WC  .  folic acid  1 mg Oral Daily  . hydrocortisone cream   Topical BID  . iopamidol  20 mL Intra-articular Once  . lidocaine  1 patch Transdermal Q24H  . linezolid (ZYVOX) IV  600 mg Intravenous Q12H  . meloxicam  15 mg Oral Daily  . multivitamin with minerals  1 tablet Oral Daily  . nicotine  21 mg Transdermal Daily  . sodium chloride  10 mL Other Once  . sodium chloride flush  10-40 mL Intracatheter Q12H  . thiamine  100 mg Oral Daily  . triamcinolone ointment   Topical BID   Continuous Infusions: . sodium chloride 75 mL/hr at 08/01/16 0658  . lactated ringers       LOS: 25 days    Dron Jaynie Collins, MD [facility] Pager [phone]  If 7PM-7AM, please contact night-coverage [email or web address] Password [email or web address] 08/01/2016, 2:51 PM

## 2016-08-01 NOTE — Progress Notes (Addendum)
    [facility]   Reason for visit: Follow up on endocarditis  Interval History: had aspiration and no fluid.  Physical Exam: Constitutional:  Vitals:   08/01/16 0442 08/01/16 1033  BP: 107/80 108/65  Pulse: 85 81  Resp: 18 18  Temp: 99.2 F (37.3 C) 98.9 F (37.2 C)   patient appears in NAD  Impression: Stable IE  Plan: 1.  I will change back to vancomycin which is optimal for her with pulmonary invovlement.  Linezolid not as active in bacteremia.  Will need to watch again for rash.   May slow infusion down some to avoid rash

## 2016-08-02 DIAGNOSIS — B9689 Other specified bacterial agents as the cause of diseases classified elsewhere: Secondary | ICD-10-CM

## 2016-08-02 DIAGNOSIS — R52 Pain, unspecified: Secondary | ICD-10-CM

## 2016-08-02 MED ORDER — METHOCARBAMOL 500 MG PO TABS
750.0000 mg | ORAL_TABLET | Freq: Three times a day (TID) | ORAL | Status: DC
  Administered 2016-08-02 – 2016-08-07 (×15): 750 mg via ORAL
  Filled 2016-08-02 (×14): qty 2

## 2016-08-02 NOTE — Clinical Social Work Note (Signed)
Contact made with Gavin Poundeborah, admissions director at [facility] [phone]) regarding patient and clinicals transmitted to facility (fax #[phone]). Contact made with Tacey RuizLeah, [facility] [phone]) and clinicals transmitted to facility (fax #[phone]). Call also made to [facility] [phone]) and message left for admissions director to call CSW.  Genelle BalVanessa Chela Sutphen, MSW, LCSW Licensed Clinical Social Worker Clinical Social Work Department [facility] [phone]

## 2016-08-02 NOTE — Progress Notes (Signed)
    [facility]   Reason for visit: Follow up on fever  Interval History: afebrile, WBC on 1/29 ok, shoulder pain after aspiration, no fluid aspirated  Physical Exam: Constitutional:  Vitals:   08/02/16 0445 08/02/16 0939  BP: (!) 90/59 (!) 100/54  Pulse: 85 78  Resp: 17 16  Temp: 99.3 F (37.4 C) 98.6 F (37 C)   patient appears anxious Eyes: anicteric HENT: no thrush Respiratory: Normal respiratory effort; CTA B Cardiovascular: RRR GI: soft, nt, nd  Review of Systems: Constitutional: negative for fevers and chills Gastrointestinal: negative for diarrhea Integument/breast: negative for pruritus  Lab Results  Component Value Date   WBC 9.7 07/31/2016   HGB 8.9 (L) 07/31/2016   HCT 29.2 (L) 07/31/2016   MCV 85.4 07/31/2016   PLT 324 07/31/2016    Lab Results  Component Value Date   CREATININE 0.68 07/31/2016   BUN 8 07/31/2016   NA 132 (L) 07/31/2016   K 4.4 07/31/2016   CL 98 (L) 07/31/2016   CO2 26 07/31/2016    Lab Results  Component Value Date   ALT 19 07/08/2016   AST 31 07/08/2016   ALKPHOS 93 07/08/2016     Microbiology: Recent Results (from the past 240 hour(s))  Culture, blood (Routine X 2) w Reflex to ID Panel     Status: None (Preliminary result)   Collection Time: 07/29/16  2:00 PM  Result Value Ref Range Status   Specimen Description BLOOD LEFT ANTECUBITAL  Final   Special Requests BOTTLES DRAWN AEROBIC ONLY 5CC  Final   Culture NO GROWTH 3 DAYS  Final   Report Status PENDING  Incomplete  Culture, blood (Routine X 2) w Reflex to ID Panel     Status: None (Preliminary result)   Collection Time: 07/29/16  2:05 PM  Result Value Ref Range Status   Specimen Description BLOOD LEFT HAND  Final   Special Requests IN PEDIATRIC BOTTLE 2CC  Final   Culture NO GROWTH 3 DAYS  Final   Report Status PENDING  Incomplete    Impression/Plan:  1. TV endocarditis - no new positive cultures.  On antibiotics through February  19th, has picc line.   Pull picc line at the end of treatment OPAT consult at discharge  2.  Bursitis - no fluid aspirated.  I most suspect continued Staph and she will continue on antibiotics  3.  Rash - resolved and I started her back on vancomycin since yesterday and no further rash at this time.   Let us know when/if she is discharged and we can arrange follow up. Thanks  I will sign off otherwise.

## 2016-08-02 NOTE — NC FL2 (Signed)
[place] MEDICAID FL2 LEVEL OF CARE SCREENING TOOL     IDENTIFICATION  Patient Name: Anna Colon Birthdate: Mar 17, 1981 Sex: female Admission Date (Current Location): 07/07/2016  [facility] [place]   Facility and Address:  [facility], [address], [place], [place] [address]      Provider Number: [id number]  Attending Physician Name and Address:  Maxie Barbron Prasad Bhandari, MD  Relative Name and Phone Number:  Guy FrancoRoach,Jeffrey - Father; [phone]     Current Level of Care: Hospital Recommended Level of Care: Skilled Nursing Facility Prior Approval Number:    Date Approved/Denied:   PASRR Number: [id number]  Discharge Plan: SNF    Current Diagnoses: Patient Active Problem List   Diagnosis Date Noted  . Pyogenic arthritis of right shoulder region (HCC)   . Acute pain of right shoulder   . Septic embolism (HCC)   . Pressure injury of skin 07/14/2016  . Acute septic pulmonary embolism without acute cor pulmonale (HCC)   . Sepsis due to methicillin resistant Staphylococcus aureus (MRSA) (HCC)   . 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 Hepatitis C antibody test positive 07/08/2016  . Normocytic anemia 07/08/2016  . Thrombocytopenia (HCC) 07/08/2016  . Sepsis (HCC) 07/07/2016  . MRSA bacteremia 07/07/2016  . Cavitary pneumonia 07/07/2016  . IVDU (intravenous drug user) 07/07/2016  . Cigarette smoker 07/07/2016  . ETOH abuse 07/07/2016    Orientation RESPIRATION BLADDER Height & Weight     Self, Time, Situation, Place  Normal Continent Weight: 121 lb (54.9 kg) Height:  5\' 2"  (157.5 cm)  BEHAVIORAL SYMPTOMS/MOOD NEUROLOGICAL BOWEL NUTRITION STATUS      Continent Diet (Regular)  AMBULATORY STATUS COMMUNICATION OF NEEDS Skin   Independent Verbally Other (Comment) (Stage 2 pressure ulcer to medial sacrum. Moisture associated skin damage to left/right buttocks, treated with barrier cream; Rash to lower, mid and upper  back.)                       Personal Care Assistance Level of Assistance  Bathing, Feeding, Dressing Bathing Assistance: Independent Feeding assistance: Independent Dressing Assistance: Independent     Functional Limitations Info  Sight, Hearing, Speech Sight Info: Adequate Hearing Info: Adequate Speech Info: Adequate    SPECIAL CARE FACTORS FREQUENCY                       Contractures Contractures Info: Not present    Additional Factors Info  Code Status, Allergies Code Status Info: Full Allergies Info: No known allergies     Isolation Precautions Info: On contact precautions - MRSA bacteremia and endocarditis with septic emboli     Current Medications (08/02/2016):  This is the current hospital active medication list Current Facility-Administered Medications  Medication Dose Route Frequency Provider Last Rate Last Dose  . 0.9 %  sodium chloride infusion   Intravenous Continuous Starleen Armsawood S Elgergawy, MD 75 mL/hr at 08/02/16 0017    . acetaminophen (TYLENOL) tablet 650 mg  650 mg Oral Q6H PRN Haydee SalterPhillip M Hobbs, MD   650 mg at 07/30/16 0543   Or  . acetaminophen (TYLENOL) suppository 650 mg  650 mg Rectal Q6H PRN Haydee SalterPhillip M Hobbs, MD      . albuterol (PROVENTIL) (2.5 MG/3ML) 0.083% nebulizer solution 2.5 mg  2.5 mg Nebulization Q6H PRN Nishant Dhungel, MD   2.5 mg at 07/19/16 1812  . cloNIDine (CATAPRES) tablet 0.1 mg  0.1 mg Oral BID Nishant  Dhungel, MD   0.1 mg at 08/01/16 2135  . enoxaparin (LOVENOX) injection 40 mg  40 mg Subcutaneous Q24H Lauren D Bajbus, RPH   40 mg at 08/01/16 2135  . feeding supplement (ENSURE ENLIVE) (ENSURE ENLIVE) liquid 237 mL  237 mL Oral TID BM Penny Pia, MD   237 mL at 08/02/16 0940  . feeding supplement (PRO-STAT SUGAR FREE 64) liquid 30 mL  30 mL Oral BID Penny Pia, MD   30 mL at 07/29/16 2028  . ferrous sulfate tablet 325 mg  325 mg Oral BID WC Nishant Dhungel, MD   325 mg at 08/02/16 0940  . folic acid (FOLVITE) tablet 1 mg   1 mg Oral Daily Haydee Salter, MD   1 mg at 08/02/16 1132  . hydrALAZINE (APRESOLINE) injection 10 mg  10 mg Intravenous Q8H PRN Haydee Salter, MD      . hydrocortisone cream 1 %   Topical BID Judyann Munson, MD      . HYDROmorphone (DILAUDID) injection 1 mg  1 mg Intravenous Q3H PRN Dron Jaynie Collins, MD   1 mg at 08/02/16 0946  . iopamidol (ISOVUE-M) 41 % intrathecal injection 20 mL  20 mL Intra-articular Once Dron Jaynie Collins, MD   Stopped at 08/01/16 1412  . lactated ringers infusion   Intravenous Continuous Charlynne Pander, DDS      . lidocaine (LIDODERM) 5 % 1 patch  1 patch Transdermal Q24H Nishant Dhungel, MD   1 patch at 07/30/16 1055  . meloxicam (MOBIC) tablet 15 mg  15 mg Oral Daily Penny Pia, MD   15 mg at 08/02/16 0939  . methocarbamol (ROBAXIN) tablet 750 mg  750 mg Oral TID Dron Jaynie Collins, MD      . multivitamin with minerals tablet 1 tablet  1 tablet Oral Daily Haydee Salter, MD   1 tablet at 08/02/16 0936  . nicotine (NICODERM CQ - dosed in mg/24 hours) patch 21 mg  21 mg Transdermal Daily Nishant Dhungel, MD   21 mg at 07/15/16 0916  . ondansetron (ZOFRAN) tablet 4 mg  4 mg Oral Q6H PRN Haydee Salter, MD       Or  . ondansetron [facility]) injection 4 mg  4 mg Intravenous Q6H PRN Haydee Salter, MD   4 mg at 08/01/16 1209  . oxyCODONE (Oxy IR/ROXICODONE) immediate release tablet 15 mg  15 mg Oral Q4H PRN Penny Pia, MD   15 mg at 08/02/16 0939  . polyethylene glycol (MIRALAX / GLYCOLAX) packet 17 g  17 g Oral Daily PRN Haydee Salter, MD   17 g at 07/28/16 1028  . sodium chloride 0.9 % injection 10 mL  10 mL Other Once Dron Jaynie Collins, MD      . sodium chloride flush (NS) 0.9 % injection 10-40 mL  10-40 mL Intracatheter Q12H Penny Pia, MD   10 mL at 07/31/16 2230  . sodium chloride flush (NS) 0.9 % injection 10-40 mL  10-40 mL Intracatheter PRN Penny Pia, MD   10 mL at 07/31/16 0454  . thiamine (VITAMIN B-1) tablet 100 mg  100 mg Oral Daily  Haydee Salter, MD   100 mg at 08/02/16 0946  . triamcinolone ointment (KENALOG) 0.5 %   Topical BID Kerin Perna, MD      . vancomycin [facility]) IVPB 1000 mg/200 mL premix  1,000 mg Intravenous Q12H Dron Jaynie Collins, MD   1,000 mg at 08/02/16 0529  .  zolpidem (AMBIEN) tablet 5 mg  5 mg Oral QHS PRN Alexis Hugelmeyer, DO   5 mg at 07/30/16 2323     Discharge Medications: Please see discharge summary for a list of discharge medications.  Relevant Imaging Results:  Relevant Lab Results:   Additional Information ss# [id number]  Cristobal Goldmann, LCSW

## 2016-08-02 NOTE — Progress Notes (Signed)
PROGRESS NOTE    [name]  ZOX:[id number] DOB: 24-Jan-1981 DOA: 07/07/2016 PCP: No PCP Per Patient   Brief Narrative: 36 year old female with history of IV drug abuse, recently released from jail, admitted with fevers, workup significant for MRSA bacteremia, cavitary pneumonia, tricuspid endocarditis, dental abscess. Seen by ID with recommendation of IV vancomycin for total of 6 weeks, status post extraction of multiple teeth extraction, had PICC line inserted, continued to spike fever.  Assessment & Plan:   # Sepsis: Secondary to MRSA bacteremia, TV endocarditis, septic pulmonary emboli and possibly right shoulder septic joint. -  Patient with IV drug abuse which is probably the cause of sepsis. Patient is afebrile now. MRI of the right shoulder consistent with possible septic arthritis.   -IR attempted to drain however no fluid to drain.  -ID changed antibiotics to IV vancomycin. As per ID needs antibiotics through February 19. Needed to pull out PICC line at the end of treatment. Follow-up with ID at the time of discharge. - CT chest consistent with improvement in bilateral cavitary lesions compatible with septic emboli. - Continues to use incentive spirometry - Most recent blood cultures repeated on 1/27, no growth to date, follow up final culture results.  # Possible right shoulder septic joint: Ortho consult appreciated.  -s/p IR attempt to aspirate the right shoulder joint, it was unsuccessful. Pt with worsening pain, added IV dilaudid for breakthrough pain. Need to taper down pain medications gradually. Discussed with the patient. -I discontinue oral Dilaudid since patient is on oral oxycodone -Added Robaxin for muscle relaxation.  # Dental abscess -  had her teeth pulled giving dental abscess.  # Pleural effusion - Status post ultrasound-guided thoracentesis 1/18 - Repeat CT chest 1/29 with slight improvement of septic emboli and effusion.  # History of substance  abuse - She is counseled - decreased Dilaudid dose to 2 mg every 4 hours.  # Tobacco abuse - Counseled on cessation. nicotine patch.  #ETOH abuse - No signs of withdrawal. Monitor on CIWA. Added low dose clonidine  # Iron deficiency anemia - continue oral iron. 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 Outpatient follow up.  # Sacral pressure ulcer - Wound care consulted  - patient encouraged to ambulate, and use incentive spirometry  Principal Problem:   MRSA bacteremia Active Problems:   Sepsis (HCC)   Cavitary pneumonia   IVDU (intravenous drug user)   Cigarette smoker   ETOH abuse   Hepatitis C antibody test positive   Normocytic anemia   Thrombocytopenia (HCC)   Endocarditis of tricuspid valve   Acute septic pulmonary embolism without acute cor pulmonale (HCC)   Sepsis due to methicillin resistant Staphylococcus aureus (MRSA) (HCC)   Pressure injury of skin   Acute pain of right shoulder   Septic embolism (HCC)   Pyogenic arthritis of right shoulder region [facility](HCC)  DVT prophylaxis: Lovenox subcutaneous Code Status: Full code Family Communication: No family present at bedside Disposition Plan: Likely discharge to rehabilitation in 2-3 days depending on clinical improvement.    Consultants:   Infectious disease  CT surgery  Orthopedics  Procedures: Teeth extraction 1/23 by Dr. Kristin BruinsKulinski Antimicrobials: IV linezolid since 07/30/2016. Was on vancomycin and Zosyn.  Subjective: Patient was seen and examined at bedside. Patient looked more alert and comfortable today. She was eating breakfast. He reported that the pain is better with the current pain regimen. Denied headache, dizziness, nausea vomiting chest pain.  Objective: Vitals:   08/01/16 1706  08/01/16 2043 08/02/16 0445 08/02/16 0939  BP: (!) 101/54 107/72 (!) 90/59 (!) 100/54  Pulse: 94 85 85 78  Resp: 18 17 17 16   Temp: 98.9 F (37.2 C) 98.3 F (36.8 C) 99.3 F (37.4 C)  98.6 F (37 C)  TempSrc: Oral   Oral  SpO2: 98% 100% 97% 93%  Weight:  54.9 kg (121 lb)    Height:        Intake/Output Summary (Last 24 hours) at 08/02/16 1316 Last data filed at 08/02/16 1101  Gross per 24 hour  Intake           2487.5 ml  Output              500 ml  Net           1987.5 ml   Filed Weights   07/27/16 2103 07/31/16 2058 08/01/16 2043  Weight: 54 kg (119 lb 0.8 oz) 59 kg (130 lb) 54.9 kg (121 lb)    Examination:  General exam: Ill-looking female lying on bed, not in distress Respiratory system: Clear bilaterally, respiratory effort normal.  Cardiovascular system: Regular rate rhythm, S1 is normal. No pedal edema.. Gastrointestinal system: Abdomen is nondistended, soft and nontender. Normal bowel sounds heard. Central nervous system: Alert and oriented. No focal neurological deficits. Extremities: No lower extremity edema. No cyanosis or clubbing. Skin: No rashes, lesions or ulcers Psychiatry: Judgement and insight appear normal. Mood & affect appropriate.     Data Reviewed: I have personally reviewed following labs and imaging studies  CBC:  Recent Labs Lab 07/29/16 0835 07/30/16 0117 07/30/16 1516 07/31/16 0455  WBC 11.8* 12.8*  --  9.7  NEUTROABS  --   --  8.6*  --   HGB 9.8* 9.8*  --  8.9*  HCT 32.4* 32.3*  --  29.2*  MCV 87.1 86.1  --  85.4  PLT 364 376  --  324   Basic Metabolic Panel:  Recent Labs Lab 07/28/16 0452 07/30/16 0117 07/31/16 0455  NA 133* 131* 132*  K 5.1 4.8 4.4  CL 96* 93* 98*  CO2 29 28 26   GLUCOSE 100* 122* 104*  BUN 13 9 8   CREATININE 0.75 0.69 0.68  CALCIUM 9.2 9.3 9.0   GFR: Estimated Creatinine Clearance: 76.2 mL/min (by C-G formula based on SCr of 0.68 mg/dL). Liver Function Tests: No results for input(s): AST, ALT, ALKPHOS, BILITOT, PROT, ALBUMIN in the last 168 hours. No results for input(s): LIPASE, AMYLASE in the last 168 hours. No results for input(s): AMMONIA in the last 168 hours. Coagulation  Profile: No results for input(s): INR, PROTIME in the last 168 hours. Cardiac Enzymes: No results for input(s): CKTOTAL, CKMB, CKMBINDEX, TROPONINI in the last 168 hours. BNP (last 3 results) No results for input(s): PROBNP in the last 8760 hours. HbA1C: No results for input(s): HGBA1C in the last 72 hours. CBG: No results for input(s): GLUCAP in the last 168 hours. Lipid Profile: No results for input(s): CHOL, HDL, LDLCALC, TRIG, CHOLHDL, LDLDIRECT in the last 72 hours. Thyroid Function Tests: No results for input(s): TSH, T4TOTAL, FREET4, T3FREE, THYROIDAB in the last 72 hours. Anemia Panel: No results for input(s): VITAMINB12, FOLATE, FERRITIN, TIBC, IRON, RETICCTPCT in the last 72 hours. Sepsis Labs: No results for input(s): PROCALCITON, LATICACIDVEN in the last 168 hours.  Recent Results (from the past 240 hour(s))  Culture, blood (Routine X 2) w Reflex to ID Panel     Status: None (Preliminary result)   Collection  Time: 07/29/16  2:00 PM  Result Value Ref Range Status   Specimen Description BLOOD LEFT ANTECUBITAL  Final   Special Requests BOTTLES DRAWN AEROBIC ONLY 5CC  Final   Culture NO GROWTH 3 DAYS  Final   Report Status PENDING  Incomplete  Culture, blood (Routine X 2) w Reflex to ID Panel     Status: None (Preliminary result)   Collection Time: 07/29/16  2:05 PM  Result Value Ref Range Status   Specimen Description BLOOD LEFT HAND  Final   Special Requests IN PEDIATRIC BOTTLE 2CC  Final   Culture NO GROWTH 3 DAYS  Final   Report Status PENDING  Incomplete         Radiology Studies: Dg Chest 2 View  Result Date: 08/01/2016 CLINICAL DATA:  Shortness of breath, right-sided pain. EXAM: CHEST  2 VIEW COMPARISON:  07/21/2016 FINDINGS: Small to moderate bilateral pleural effusions. Patchy bilateral airspace disease, slightly improved since prior study. Heart is normal size. Right PICC line is unchanged. IMPRESSION: Small to moderate bilateral pleural effusions with  improving patchy bilateral airspace disease. Electronically Signed   By: Charlett Nose M.D.   On: 08/01/2016 15:05   Dg Fluoro Rm 1-60 Min  Result Date: 08/01/2016 CLINICAL DATA:  Subacromial/subdeltoid bursitis. EXAM: FLOURO RM 1-60 MIN COMPARISON:  MRI right shoulder 07/30/2016 FINDINGS: Written informed consent was obtained. The procedure was explained in detail including the risks of bleeding, infection and damage to normal structures. An appropriate time-out was performed before the procedure. Upright treated site for subacromial bursal aspiration with marked on the patient's skin and the patient was prepped and draped in the usual sterile fashion. Local anesthesia was performed along the posterior and distal aspect of the acromion. A 21 gauge needle was then inserted in the subacromial/subdeltoid bursa and injection of 1% xylocaine showed no resistance. However, I was unable to aspirate any fluid from the bursa. I made a second attempt laterally but again no fluid was aspirated. Reviewing the MRI there appears to be severe inflammation of the bursa with a small amount of fluid suspected but mainly enhancing inflammatory tissue/phlegmon. IMPRESSION: Unsuccessful aspiration of the right subacromial/subdeltoid bursa. Electronically Signed   By: Rudie Meyer M.D.   On: 08/01/2016 14:43        Scheduled Meds: . cloNIDine  0.1 mg Oral BID  . enoxaparin (LOVENOX) injection  40 mg Subcutaneous Q24H  . feeding supplement (ENSURE ENLIVE)  237 mL Oral TID BM  . feeding supplement (PRO-STAT SUGAR FREE 64)  30 mL Oral BID  . ferrous sulfate  325 mg Oral BID WC  . folic acid  1 mg Oral Daily  . hydrocortisone cream   Topical BID  . iopamidol  20 mL Intra-articular Once  . lidocaine  1 patch Transdermal Q24H  . meloxicam  15 mg Oral Daily  . methocarbamol  750 mg Oral TID  . multivitamin with minerals  1 tablet Oral Daily  . nicotine  21 mg Transdermal Daily  . sodium chloride  10 mL Other Once  .  sodium chloride flush  10-40 mL Intracatheter Q12H  . thiamine  100 mg Oral Daily  . triamcinolone ointment   Topical BID  . vancomycin  1,000 mg Intravenous Q12H   Continuous Infusions: . sodium chloride 75 mL/hr at 08/02/16 0017  . lactated ringers       LOS: 26 days    Dron Jaynie Collins, MD [facility] Pager [phone]  If 7PM-7AM, please contact night-coverage  [email or web address] Password [email or web address] 08/02/2016, 1:16 PM

## 2016-08-03 LAB — CULTURE, BLOOD (ROUTINE X 2)
CULTURE: NO GROWTH
Culture: NO GROWTH

## 2016-08-03 LAB — BASIC METABOLIC PANEL
Anion gap: 9 (ref 5–15)
BUN: 7 mg/dL (ref 6–20)
CO2: 27 mmol/L (ref 22–32)
CREATININE: 0.59 mg/dL (ref 0.44–1.00)
Calcium: 9.4 mg/dL (ref 8.9–10.3)
Chloride: 99 mmol/L — ABNORMAL LOW (ref 101–111)
Glucose, Bld: 103 mg/dL — ABNORMAL HIGH (ref 65–99)
Potassium: 4.6 mmol/L (ref 3.5–5.1)
SODIUM: 135 mmol/L (ref 135–145)

## 2016-08-03 LAB — VANCOMYCIN, TROUGH: Vancomycin Tr: 12 ug/mL — ABNORMAL LOW (ref 15–20)

## 2016-08-03 MED ORDER — VANCOMYCIN HCL 10 G IV SOLR
1500.0000 mg | Freq: Two times a day (BID) | INTRAVENOUS | Status: DC
  Administered 2016-08-04 – 2016-08-05 (×3): 1500 mg via INTRAVENOUS
  Filled 2016-08-03 (×6): qty 1500

## 2016-08-03 MED ORDER — SODIUM CHLORIDE 0.9% FLUSH
10.0000 mL | INTRAVENOUS | Status: DC | PRN
  Administered 2016-08-08: 10 mL
  Filled 2016-08-03: qty 40

## 2016-08-03 NOTE — Progress Notes (Signed)
   Subjective:  Patient reports pain as improving.  No pain with passive motion of the shoulder.  Objective:   VITALS:   Vitals:   08/02/16 2130 08/03/16 0620 08/03/16 0827 08/03/16 1835  BP: 121/80 (!) 95/56 (!) 113/92 104/88  Pulse: 80 87 73 76  Resp: 18 16 18    Temp: 99.3 F (37.4 C) 98.7 F (37.1 C) 99.6 F (37.6 C) 98.4 F (36.9 C)  TempSrc: Oral Oral Oral Oral  SpO2: 92% 97% 98% 98%  Weight: 54.1 kg (119 lb 4.8 oz)     Height:         Right shoulder examination- tender to palpation along the lateral aspect of the acromion. Nontender along the course of the superficial veins muscle belly nontender at the before meals joint. She has no pain with passive range of motion at the shoulder.  Neurovascularly Intact distal.  No erythema or warmth about the shoulder.  Lab Results  Component Value Date   WBC 9.7 07/31/2016   HGB 8.9 (L) 07/31/2016   HCT 29.2 (L) 07/31/2016   MCV 85.4 07/31/2016   PLT 324 07/31/2016   BMET    Component Value Date/Time   NA 135 08/03/2016 1638   K 4.6 08/03/2016 1638   CL 99 (L) 08/03/2016 1638   CO2 27 08/03/2016 1638   GLUCOSE 103 (H) 08/03/2016 1638   BUN 7 08/03/2016 1638   CREATININE 0.59 08/03/2016 1638   CALCIUM 9.4 08/03/2016 1638   GFRNONAA >60 08/03/2016 1638   GFRAA >60 08/03/2016 1638     Assessment/Plan: 9 Days Post-Op   Principal Problem:   MRSA bacteremia Active Problems:   Sepsis (HCC)   Cavitary pneumonia   IVDU (intravenous drug user)   Cigarette smoker   ETOH abuse   Hepatitis C antibody test positive   Normocytic anemia   Thrombocytopenia (HCC)   Endocarditis of tricuspid valve   Acute septic pulmonary embolism without acute cor pulmonale (HCC)   Sepsis due to methicillin resistant Staphylococcus aureus (MRSA) (HCC)   Pressure injury of skin   Acute pain of right shoulder   Septic embolism (HCC)   Pyogenic arthritis of right shoulder region (HCC)   Pain   -She continues to respond to IV  antibiotics for what appears to be inflammatory bursitis of the right shoulder as well as some myositis and supraspinous muscle. -No indication for orthopedic surgery intervention at this time. -Will sign off.  Yolonda KidaJason Patrick Rogers 08/03/2016, 6:49 PM   Maryan RuedJason P Rogers, MD [phone]

## 2016-08-03 NOTE — Progress Notes (Signed)
Pharmacy Antibiotic Note Anna CaterConstance Havens is a 36 y.o. female with history of IV drug abuse released from jail 07/06/16 who presents with nausea, vomiting, and fever (tmax 104), and chills admitted on 07/07/2016 and found to have MRSA bacteremia and endocarditis with septic emboli. Pt maintained on vancomycin from 1/5 until 1/28 when pt developed pelvic rash and was transitioned to Linezolid.   vt = 15 renal fx stable   Plan: Increase vanc to 1500 q12 next dose inam  Height: 5\' 2"  (157.5 cm) Weight: 119 lb 4.8 oz (54.1 kg) IBW/kg (Calculated) : 50.1  Temp (24hrs), Avg:99.2 F (37.3 C), Min:98.7 F (37.1 C), Max:99.6 F (37.6 C)   Recent Labs Lab 07/28/16 0452 07/29/16 0835 07/30/16 0117 07/31/16 0455 08/03/16 1638  WBC  --  11.8* 12.8* 9.7  --   CREATININE 0.75  --  0.69 0.68 0.59  VANCOTROUGH  --  22*  --   --  12*    Estimated Creatinine Clearance: 76.2 mL/min (by C-G formula based on SCr of 0.59 mg/dL).    No Known Allergies  Antimicrobials this admission:  Vanc 1/5 >> 1/28 -developed puritic rash on torso 1/29 >>  Linezolid 1/28>> (2/19) Zosyn 1/5 >> 1/5   Dose adjustments this admission:  1/6 VT 15 - on vanc 750 mg IV q 8 hrs 1/8 VT 18 - on vanc 1g IV q 8 hrs 1/12 VT 24 drawn from PICC line 1.5hr early - will leave dose alone for now 1/16 @ 0415 measured VT @30  mcg/ml (only 5 hours after evening dose) so corrects to ~24 mcg/ml >> will reduce to 750 mg/8h 1/18 VT 16 >> continue Vanc 750 mg/8h 1/21 VT 18 >> continue vanc 750mg /8h 1/23 VT 8 (AM dose got held for dental procedure)-not trough 1/25 VT 22 >> change vanc to 1g/q12h - changed to q12h dosing to help at discharge. Estimated ke 0.0758  Microbiology results:  1/5 BCx: 2/2 MRSA 1/5 BCID - MRSA 1/5 UCx: neg 1/5 MRSA PCR: (+) 1/6 resp panel: neg 1/6 HCV Ab: 11, viral load not calc- spontaneous clearing per ID 1/6 BCx: 1/2 MRSA 1/7 BCx:1/2 staph aureus 1/9 Bcx: neg 1/18 peritoneal fluid: neg 1/27 BCx:  ngtd 1/30 shoulder aspiration  Isaac BlissMichael Lynnwood Beckford, PharmD, BCPS, BCCCP Clinical Pharmacist 08/03/2016 5:37 PM

## 2016-08-03 NOTE — Clinical Social Work Note (Signed)
CSW talked with patient at the bedside regarding her discharge plan. Patient advised regarding facility she will discharge to - [facility] and talked with her regarding strict no smoking at facility. Patient reported that she is a former smoker, so this is not an issue for her. CSW also requested that patient talk with her dad regarding providing transportation once she is ready for discharge on 2/19 after last IV antibiotic dose. Ms. Carolin CoyRoach agreed to talk with her dad.   Facility admissions staff, Gavin PoundDeborah contacted this morning and informed that patient not ready for discharge today. CSW will continue to follow and facilitate discharge to [facility] once medically stable.  Genelle BalVanessa Trayshawn Durkin, MSW, LCSW Licensed Clinical Social Worker Clinical Social Work Department [facility] [phone]

## 2016-08-03 NOTE — Progress Notes (Signed)
PROGRESS NOTE    Anna Colon  ZOX:[id number] DOB: 1980/09/01 DOA: 07/07/2016 PCP: No PCP Per Patient   Brief Narrative: 36 year old female with history of IV drug abuse, recently released from jail, admitted with fevers, workup significant for MRSA bacteremia, cavitary pneumonia, tricuspid endocarditis, dental abscess. Seen by ID with recommendation of IV vancomycin for total of 6 weeks, status post extraction of multiple teeth extraction, had PICC line inserted, continued to spike fever.  Assessment & Plan:   # Sepsis: Secondary to MRSA bacteremia, TV endocarditis, septic pulmonary emboli and possibly right shoulder septic joint. -  Patient with IV drug abuse which is probably the cause of sepsis. Patient is afebrile now. MRI of the right shoulder consistent with possible septic arthritis.   -IR attempted to drain however no fluid to drain.  -ID changed antibiotics to IV vancomycin. As per ID needs antibiotics through February 19. Needed to pull out PICC line at the end of treatment. Follow-up with ID at the time of discharge. - CT chest consistent with improvement in bilateral cavitary lesions compatible with septic emboli. - Continues to use incentive spirometry - Most recent blood cultures repeated on 1/27, no growth to date, follow up final culture results.  # Possible right shoulder septic joint: Ortho consult appreciated.  -s/p IR attempt to aspirate the right shoulder joint, it was unsuccessful. -Discontinue IV pain medications, IV fluid today. Try to taper down the amount of pain medications. Patient will likely discharge to SNF tomorrow with IV antibiotics. Discussed with the social worker today. -Added Robaxin for muscle relaxation.  # Dental abscess -  had her teeth pulled giving dental abscess.  # Pleural effusion - Status post ultrasound-guided thoracentesis 1/18 - Repeat CT chest 1/29 with slight improvement of septic emboli and effusion.  # History of substance  abuse - She is counseled - decreased Dilaudid dose to 2 mg every 4 hours.  # Tobacco abuse - Counseled on cessation. nicotine patch.  #ETOH abuse - No signs of withdrawal. Monitor on CIWA. Added low dose clonidine  # Iron deficiency anemia - continue oral iron. 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 Outpatient follow up.  # Sacral pressure ulcer - Wound care consulted  - patient encouraged to ambulate, and use incentive spirometry  Principal Problem:   MRSA bacteremia Active Problems:   Sepsis (HCC)   Cavitary pneumonia   IVDU (intravenous drug user)   Cigarette smoker   ETOH abuse   Hepatitis C antibody test positive   Normocytic anemia   Thrombocytopenia (HCC)   Endocarditis of tricuspid valve   Acute septic pulmonary embolism without acute cor pulmonale (HCC)   Sepsis due to methicillin resistant Staphylococcus aureus (MRSA) (HCC)   Pressure injury of skin   Acute pain of right shoulder   Septic embolism (HCC)   Pyogenic arthritis of right shoulder region (HCC)   Pain  DVT prophylaxis: Lovenox subcutaneous Code Status: Full code Family Communication: No family present at bedside Disposition Plan: Likely discharge to rehabilitation in 1-2 days depending on clinical improvement.    Consultants:   Infectious disease  CT surgery  Orthopedics  Procedures: Teeth extraction 1/23 by Dr. Kristin Bruins Antimicrobials: IV vancomycin.  Subjective: Patient was seen and examined at bedside. Patient reported feeling better. Denied headache, dizziness, nausea vomiting chest pain. Pain is stable Objective: Vitals:   08/02/16 1646 08/02/16 2130 08/03/16 0620 08/03/16 0827  BP: 97/62 121/80 (!) 95/56 (!) 113/92  Pulse: 82 80 87 73  Resp: 18 18 16 18   Temp: 98.8 F (37.1 C) 99.3 F (37.4 C) 98.7 F (37.1 C) 99.6 F (37.6 C)  TempSrc: Oral Oral Oral Oral  SpO2: 98% 92% 97% 98%  Weight:  54.1 kg (119 lb 4.8 oz)    Height:         Intake/Output Summary (Last 24 hours) at 08/03/16 1335 Last data filed at 08/03/16 1100  Gross per 24 hour  Intake             4240 ml  Output                0 ml  Net             4240 ml   Filed Weights   07/31/16 2058 08/01/16 2043 08/02/16 2130  Weight: 59 kg (130 lb) 54.9 kg (121 lb) 54.1 kg (119 lb 4.8 oz)    Examination:  General exam: Ill-looking female, lying on bed, not in distress Respiratory system: Clear bilateral, respiratory effort normal. Cardiovascular system: Regular rate rhythm, S1 is normal. No pedal edema.. Gastrointestinal system: Abdomen is nondistended, soft and nontender. Normal bowel sounds heard. Central nervous system: Alert, awake, following commands.  Extremities: No lower extremity edema. No cyanosis or clubbing. Skin: No rashes, lesions or ulcers Psychiatry: Judgement and insight appear normal. Mood & affect appropriate.     Data Reviewed: I have personally reviewed following labs and imaging studies  CBC:  Recent Labs Lab 07/29/16 0835 07/30/16 0117 07/30/16 1516 07/31/16 0455  WBC 11.8* 12.8*  --  9.7  NEUTROABS  --   --  8.6*  --   HGB 9.8* 9.8*  --  8.9*  HCT 32.4* 32.3*  --  29.2*  MCV 87.1 86.1  --  85.4  PLT 364 376  --  324   Basic Metabolic Panel:  Recent Labs Lab 07/28/16 0452 07/30/16 0117 07/31/16 0455  NA 133* 131* 132*  K 5.1 4.8 4.4  CL 96* 93* 98*  CO2 29 28 26   GLUCOSE 100* 122* 104*  BUN 13 9 8   CREATININE 0.75 0.69 0.68  CALCIUM 9.2 9.3 9.0   GFR: Estimated Creatinine Clearance: 76.2 mL/min (by C-G formula based on SCr of 0.68 mg/dL). Liver Function Tests: No results for input(s): AST, ALT, ALKPHOS, BILITOT, PROT, ALBUMIN in the last 168 hours. No results for input(s): LIPASE, AMYLASE in the last 168 hours. No results for input(s): AMMONIA in the last 168 hours. Coagulation Profile: No results for input(s): INR, PROTIME in the last 168 hours. Cardiac Enzymes: No results for input(s): CKTOTAL,  CKMB, CKMBINDEX, TROPONINI in the last 168 hours. BNP (last 3 results) No results for input(s): PROBNP in the last 8760 hours. HbA1C: No results for input(s): HGBA1C in the last 72 hours. CBG: No results for input(s): GLUCAP in the last 168 hours. Lipid Profile: No results for input(s): CHOL, HDL, LDLCALC, TRIG, CHOLHDL, LDLDIRECT in the last 72 hours. Thyroid Function Tests: No results for input(s): TSH, T4TOTAL, FREET4, T3FREE, THYROIDAB in the last 72 hours. Anemia Panel: No results for input(s): VITAMINB12, FOLATE, FERRITIN, TIBC, IRON, RETICCTPCT in the last 72 hours. Sepsis Labs: No results for input(s): PROCALCITON, LATICACIDVEN in the last 168 hours.  Recent Results (from the past 240 hour(s))  Culture, blood (Routine X 2) w Reflex to ID Panel     Status: None (Preliminary result)   Collection Time: 07/29/16  2:00 PM  Result Value Ref Range Status   Specimen Description BLOOD LEFT  ANTECUBITAL  Final   Special Requests BOTTLES DRAWN AEROBIC ONLY 5CC  Final   Culture NO GROWTH 4 DAYS  Final   Report Status PENDING  Incomplete  Culture, blood (Routine X 2) w Reflex to ID Panel     Status: None (Preliminary result)   Collection Time: 07/29/16  2:05 PM  Result Value Ref Range Status   Specimen Description BLOOD LEFT HAND  Final   Special Requests IN PEDIATRIC BOTTLE 2CC  Final   Culture NO GROWTH 4 DAYS  Final   Report Status PENDING  Incomplete         Radiology Studies: Dg Chest 2 View  Result Date: 08/01/2016 CLINICAL DATA:  Shortness of breath, right-sided pain. EXAM: CHEST  2 VIEW COMPARISON:  07/21/2016 FINDINGS: Small to moderate bilateral pleural effusions. Patchy bilateral airspace disease, slightly improved since prior study. Heart is normal size. Right PICC line is unchanged. IMPRESSION: Small to moderate bilateral pleural effusions with improving patchy bilateral airspace disease. Electronically Signed   By: Charlett NoseKevin  Dover M.D.   On: 08/01/2016 15:05   Dg Fluoro  Rm 1-60 Min  Result Date: 08/01/2016 CLINICAL DATA:  Subacromial/subdeltoid bursitis. EXAM: FLOURO RM 1-60 MIN COMPARISON:  MRI right shoulder 07/30/2016 FINDINGS: Written informed consent was obtained. The procedure was explained in detail including the risks of bleeding, infection and damage to normal structures. An appropriate time-out was performed before the procedure. Upright treated site for subacromial bursal aspiration with marked on the patient's skin and the patient was prepped and draped in the usual sterile fashion. Local anesthesia was performed along the posterior and distal aspect of the acromion. A 21 gauge needle was then inserted in the subacromial/subdeltoid bursa and injection of 1% xylocaine showed no resistance. However, I was unable to aspirate any fluid from the bursa. I made a second attempt laterally but again no fluid was aspirated. Reviewing the MRI there appears to be severe inflammation of the bursa with a small amount of fluid suspected but mainly enhancing inflammatory tissue/phlegmon. IMPRESSION: Unsuccessful aspiration of the right subacromial/subdeltoid bursa. Electronically Signed   By: Rudie MeyerP.  Gallerani M.D.   On: 08/01/2016 14:43        Scheduled Meds: . cloNIDine  0.1 mg Oral BID  . enoxaparin (LOVENOX) injection  40 mg Subcutaneous Q24H  . feeding supplement (ENSURE ENLIVE)  237 mL Oral TID BM  . feeding supplement (PRO-STAT SUGAR FREE 64)  30 mL Oral BID  . ferrous sulfate  325 mg Oral BID WC  . folic acid  1 mg Oral Daily  . hydrocortisone cream   Topical BID  . iopamidol  20 mL Intra-articular Once  . lidocaine  1 patch Transdermal Q24H  . meloxicam  15 mg Oral Daily  . methocarbamol  750 mg Oral TID  . multivitamin with minerals  1 tablet Oral Daily  . nicotine  21 mg Transdermal Daily  . sodium chloride  10 mL Other Once  . sodium chloride flush  10-40 mL Intracatheter Q12H  . thiamine  100 mg Oral Daily  . triamcinolone ointment   Topical BID  .  vancomycin  1,000 mg Intravenous Q12H   Continuous Infusions: . lactated ringers       LOS: 27 days    Daija Routson Jaynie CollinsPrasad Atanacio Melnyk, MD [facility] Pager [phone]  If 7PM-7AM, please contact night-coverage [email or web address] Password [email or web address] 08/03/2016, 1:35 PM

## 2016-08-04 DIAGNOSIS — R509 Fever, unspecified: Secondary | ICD-10-CM

## 2016-08-04 DIAGNOSIS — F1920 Other psychoactive substance dependence, uncomplicated: Secondary | ICD-10-CM

## 2016-08-04 DIAGNOSIS — Z9189 Other specified personal risk factors, not elsewhere classified: Secondary | ICD-10-CM

## 2016-08-04 DIAGNOSIS — F1420 Cocaine dependence, uncomplicated: Secondary | ICD-10-CM

## 2016-08-04 DIAGNOSIS — M7551 Bursitis of right shoulder: Secondary | ICD-10-CM

## 2016-08-04 DIAGNOSIS — F1910 Other psychoactive substance abuse, uncomplicated: Secondary | ICD-10-CM

## 2016-08-04 LAB — CREATININE, SERUM
CREATININE: 0.64 mg/dL (ref 0.44–1.00)
GFR calc Af Amer: >60 mL/min (ref >60)
GFR calc non Af Amer: >60 mL/min (ref >60)

## 2016-08-04 MED ORDER — FAMOTIDINE 20 MG PO TABS
20.0000 mg | ORAL_TABLET | Freq: Every day | ORAL | Status: DC
  Administered 2016-08-04 – 2016-08-08 (×5): 20 mg via ORAL
  Filled 2016-08-04 (×5): qty 1

## 2016-08-04 MED ORDER — OXYCODONE HCL 5 MG PO TABS
10.0000 mg | ORAL_TABLET | Freq: Four times a day (QID) | ORAL | Status: DC | PRN
  Administered 2016-08-04 – 2016-08-08 (×13): 10 mg via ORAL
  Filled 2016-08-04 (×13): qty 2

## 2016-08-04 MED ORDER — IBUPROFEN 400 MG PO TABS: 400.0000 mg | ORAL_TABLET | Freq: Four times a day (QID) | ORAL | Status: DC | PRN

## 2016-08-04 MED ORDER — IBUPROFEN 400 MG PO TABS
400.0000 mg | ORAL_TABLET | Freq: Two times a day (BID) | ORAL | Status: DC
  Administered 2016-08-04 – 2016-08-05 (×4): 400 mg via ORAL
  Filled 2016-08-04 (×4): qty 1

## 2016-08-04 NOTE — Clinical Social Work Note (Signed)
Gavin Poundeborah, admissions director with [facility] contacted and provided update regarding patient's readiness for discharge. CSW informed that nursing director at facility is requesting that patient not discharge over weekend due to patient's recent drug use and reduced weekend staffing at their facility. MD advised of request via text page. CSW will continue to follow, provided information and support to patient as needed and assist with patient's discharge to facility.  Genelle BalVanessa Colbi Staubs, MSW, LCSW Licensed Clinical Social Worker Clinical Social Work Department [facility] [phone]

## 2016-08-04 NOTE — Consult Note (Signed)
Date of Admission:  07/07/2016  Date of Consult:  08/04/2016  Reason for Consult: Persistent fevers Referring Physician: Dr. Carolin Sicks   HPI: Anna Colon is an 36 y.o. 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 She was managed on IV vancomycin. During her course she developed shoulder pain and an MRI was done on 07/30/16 which showed:  1. Subacromial subdeltoid bursitis with fairly thick synovitis, suspicious for infection in the subacromial subdeltoid bursa. There is some fluid extending in the rotator interval but I am skeptical of septic glenohumeral joint. 2. Enhancement in the supraspinatus and to a lesser extent infraspinatus muscles, suspicious for myositis. 3. Nodules in the lungs likely from septic emboli. 4. No osteomyelitis identified  Eventual radiology attempted aspirate fluid from the joint but were unable to do so. She has had persistent shoulder pain and now has had ongoing fevers since we last saw her.  I suspect that she has progressive infection in the shoulder and then ultimately she is going to require surgery there to control his likely another "metastatic " site of infection.   Past Medical History:  Diagnosis Date  . Asthma   . ETOH abuse   . Hepatitis C   . IVDU (intravenous drug user)     Past Surgical History:  Procedure Laterality Date  . CESAREAN SECTION    . MULTIPLE EXTRACTIONS WITH ALVEOLOPLASTY N/A 07/25/2016   Procedure: MULTIPLE EXTRACTION OF TOOTH #'s 18,19 and 21 thru 31  WITH ALVEOLOPLASTY;  Surgeon: Lenn Cal, DDS;  Location: [facility];  Service: Oral Surgery;  Laterality: N/A;    Social History:  reports that she has been smoking.  She has been smoking about 1.00 pack per day. She has never used smokeless tobacco. She reports that she uses drugs, including IV and Cocaine. She reports that she does not drink  alcohol.   History reviewed. No pertinent family history.  No Known Allergies   Medications: I have reviewed patients current medications as documented in Epic Anti-infectives    Start     Dose/Rate Route Frequency Ordered Stop   08/04/16 0500  vancomycin (VANCOCIN) 1,500 mg in sodium chloride 0.9 % 500 mL IVPB     1,500 mg 250 mL/hr over 120 Minutes Intravenous Every 12 hours 08/03/16 1737     08/02/16 0500  vancomycin (VANCOCIN) IVPB 1000 mg/200 mL premix  Status:  Discontinued     1,000 mg 100 mL/hr over 120 Minutes Intravenous Every 12 hours 08/01/16 1609 08/03/16 1737   08/01/16 1545  vancomycin (VANCOCIN) IVPB 1000 mg/200 mL premix     1,000 mg 200 mL/hr over 60 Minutes Intravenous  Once 08/01/16 1540 08/01/16 1810   07/30/16 1500  linezolid (ZYVOX) IVPB 600 mg  Status:  Discontinued     600 mg 300 mL/hr over 60 Minutes Intravenous Every 12 hours 07/30/16 1410 08/01/16 1531   07/29/16 2200  vancomycin (VANCOCIN) IVPB 1000 mg/200 mL premix  Status:  Discontinued     1,000 mg 200 mL/hr over 60 Minutes Intravenous Every 12 hours 07/29/16 1016 07/30/16 1411   07/27/16 2100  vancomycin (VANCOCIN) 1,250 mg in sodium chloride 0.9 % 250 mL IVPB  Status:  Discontinued     1,250 mg 166.7 mL/hr over 90 Minutes Intravenous Every 12 hours 07/27/16 1235 07/29/16 1016   07/26/16 2030  vancomycin (VANCOCIN) IVPB 750 mg/150 ml premix  Status:  Discontinued  750 mg 150 mL/hr over 60 Minutes Intravenous Every 8 hours 07/25/16 2011 07/25/16 2040   07/25/16 2040  vancomycin (VANCOCIN) IVPB 750 mg/150 ml premix  Status:  Discontinued     750 mg 150 mL/hr over 60 Minutes Intravenous Every 8 hours 07/25/16 2040 07/27/16 1235   07/18/16 1200  vancomycin (VANCOCIN) IVPB 750 mg/150 ml premix  Status:  Discontinued     750 mg 150 mL/hr over 60 Minutes Intravenous Every 8 hours 07/18/16 1002 07/25/16 2011   07/08/16 1515  vancomycin (VANCOCIN) IVPB 1000 mg/200 mL premix  Status:  Discontinued      1,000 mg 200 mL/hr over 60 Minutes Intravenous Every 8 hours 07/08/16 1501 07/18/16 0556   07/07/16 2200  piperacillin-tazobactam (ZOSYN) IVPB 3.375 g  Status:  Discontinued     3.375 g 12.5 mL/hr over 240 Minutes Intravenous Every 8 hours 07/07/16 1605 07/07/16 1754   07/07/16 1615  vancomycin (VANCOCIN) IVPB 750 mg/150 ml premix  Status:  Discontinued     750 mg 150 mL/hr over 60 Minutes Intravenous Every 8 hours 07/07/16 1605 07/08/16 1501   07/07/16 0813  piperacillin-tazobactam (ZOSYN) IVPB 3.375 g     3.375 g 100 mL/hr over 30 Minutes Intravenous  Once 07/07/16 0644 07/07/16 0839   07/07/16 0215  piperacillin-tazobactam (ZOSYN) IVPB 3.375 g     3.375 g 100 mL/hr over 30 Minutes Intravenous  Once 07/07/16 0213 07/07/16 0313   07/07/16 0215  vancomycin (VANCOCIN) IVPB 1000 mg/200 mL premix     1,000 mg 200 mL/hr over 60 Minutes Intravenous  Once 07/07/16 0213 07/07/16 0343         ROS:  as in HPI otherwise remainder of 12 point Review of Systems is negative  Blood pressure (!) 110/55, pulse 80, temperature 98.5 F (36.9 C), temperature source Oral, resp. rate 16, height '5\' 2"'$  (1.575 m), weight 119 lb 4.8 oz (54.1 kg), last menstrual period 05/16/2016, SpO2 100 %. General: Alert and awake, oriented x3, coming tearful when I told her about her ongoing fevers and my concerns for her shoulder being still infected and that she would likely need surgery. HEENT: anicteric sclera,  EOMI, oropharynx clear and without exudate Cardiovascular: tachy rate, normal r,  Murmur LUSB Pulmonary: Agrees breath sounds at the bases Gastrointestinal: soft nontender, nondistended, normal bowel sounds, Musculoskeletal : Inability to abduct elevate shoulder tenderness on palpation of right shoulder Skin, soft tissue: no rashes Neuro: nonfocal, strength and sensation intact   Results for orders placed or performed during the hospital encounter of 07/07/16 (from the past 48 hour(s))  Basic metabolic  panel     Status: Abnormal   Collection Time: 08/03/16  4:38 PM  Result Value Ref Range   Sodium 135 135 - 145 mmol/L   Potassium 4.6 3.5 - 5.1 mmol/L   Chloride 99 (L) 101 - 111 mmol/L   CO2 27 22 - 32 mmol/L   Glucose, Bld 103 (H) 65 - 99 mg/dL   BUN 7 6 - 20 mg/dL   Creatinine, Ser 0.59 0.44 - 1.00 mg/dL   Calcium 9.4 8.9 - 10.3 mg/dL   GFR calc non Af Amer >60 >60 mL/min   GFR calc Af Amer >60 >60 mL/min    Comment: (NOTE) The eGFR has been calculated using the CKD EPI equation. This calculation has not been validated in all clinical situations. eGFR's persistently <60 mL/min signify possible Chronic Kidney Disease.    Anion gap 9 5 - 15  Vancomycin, trough  Status: Abnormal   Collection Time: 08/03/16  4:38 PM  Result Value Ref Range   Vancomycin Tr 12 (L) 15 - 20 ug/mL  Creatinine, serum     Status: None   Collection Time: 08/04/16  4:40 AM  Result Value Ref Range   Creatinine, Ser 0.64 0.44 - 1.00 mg/dL   GFR calc non Af Amer >60 >60 mL/min   GFR calc Af Amer >60 >60 mL/min    Comment: (NOTE) The eGFR has been calculated using the CKD EPI equation. This calculation has not been validated in all clinical situations. eGFR's persistently <60 mL/min signify possible Chronic Kidney Disease.    '@BRIEFLABTABLE'$ (sdes,specrequest,cult,reptstatus)   ) Recent Results (from the past 720 hour(s))  Blood Culture (routine x 2)     Status: Abnormal   Collection Time: 07/07/16 12:40 AM  Result Value Ref Range Status   Specimen Description BLOOD BLOOD RIGHT FOREARM  Final   Special Requests BOTTLES DRAWN AEROBIC AND ANAEROBIC 5CC EACH  Final   Culture  Setup Time   Final    GRAM POSITIVE COCCI IN CLUSTERS IN BOTH AEROBIC AND ANAEROBIC BOTTLES CRITICAL RESULT CALLED TO, READ BACK BY AND VERIFIED WITH: LPablo Lawrence.D. 17:15 07/07/16  (wilsonm) Performed at [facility] (A)  Final   Report Status 07/09/2016 FINAL   Final   Organism ID, Bacteria METHICILLIN RESISTANT STAPHYLOCOCCUS AUREUS  Final      Susceptibility   Methicillin resistant staphylococcus aureus - MIC*    CIPROFLOXACIN <=0.5 SENSITIVE Sensitive     ERYTHROMYCIN >=8 RESISTANT Resistant     GENTAMICIN <=0.5 SENSITIVE Sensitive     OXACILLIN >=4 RESISTANT Resistant     TETRACYCLINE <=1 SENSITIVE Sensitive     VANCOMYCIN 1 SENSITIVE Sensitive     TRIMETH/SULFA <=10 SENSITIVE Sensitive     CLINDAMYCIN <=0.25 SENSITIVE Sensitive     RIFAMPIN <=0.5 SENSITIVE Sensitive     Inducible Clindamycin NEGATIVE Sensitive     * METHICILLIN RESISTANT STAPHYLOCOCCUS AUREUS  Blood Culture ID Panel (Reflexed)     Status: Abnormal   Collection Time: 07/07/16 12:40 AM  Result Value Ref Range Status   Enterococcus species NOT DETECTED NOT DETECTED Final   Listeria monocytogenes NOT DETECTED NOT DETECTED Final   Staphylococcus species DETECTED (A) NOT DETECTED Final    Comment: CRITICAL RESULT CALLED TO, READ BACK BY AND VERIFIED WITH: LPablo Lawrence.D. 17:15 07/07/16 (wilsonm)    Staphylococcus aureus DETECTED (A) NOT DETECTED Final    Comment: CRITICAL RESULT CALLED TO, READ BACK BY AND VERIFIED WITH: LPablo Lawrence.D. 17:15 07/07/16 (wilsonm)    Methicillin resistance DETECTED (A) NOT DETECTED Final    Comment: CRITICAL RESULT CALLED TO, READ BACK BY AND VERIFIED WITH: LPablo Lawrence.D. 17:15 07/07/16 (wilsonm)    Streptococcus species NOT DETECTED NOT DETECTED Final   Streptococcus agalactiae NOT DETECTED NOT DETECTED Final   Streptococcus pneumoniae NOT DETECTED NOT DETECTED Final   Streptococcus pyogenes NOT DETECTED NOT DETECTED Final   Acinetobacter baumannii NOT DETECTED NOT DETECTED Final   Enterobacteriaceae species NOT DETECTED NOT DETECTED Final   Enterobacter cloacae complex NOT DETECTED NOT DETECTED Final   Escherichia coli NOT DETECTED NOT DETECTED Final   Klebsiella oxytoca NOT DETECTED NOT DETECTED Final   Klebsiella pneumoniae NOT  DETECTED NOT DETECTED Final   Proteus species NOT DETECTED NOT DETECTED Final   Serratia marcescens NOT DETECTED NOT DETECTED Final   Haemophilus influenzae NOT DETECTED NOT DETECTED  Final   Neisseria meningitidis NOT DETECTED NOT DETECTED Final   Pseudomonas aeruginosa NOT DETECTED NOT DETECTED Final   Candida albicans NOT DETECTED NOT DETECTED Final   Candida glabrata NOT DETECTED NOT DETECTED Final   Candida krusei NOT DETECTED NOT DETECTED Final   Candida parapsilosis NOT DETECTED NOT DETECTED Final   Candida tropicalis NOT DETECTED NOT DETECTED Final    Comment: Performed at [facility]  Blood Culture (routine x 2)     Status: Abnormal   Collection Time: 07/07/16 12:50 AM  Result Value Ref Range Status   Specimen Description BLOOD BLOOD LEFT FOREARM  Final   Special Requests IN PEDIATRIC BOTTLE 3CC  Final   Culture  Setup Time   Final    GRAM POSITIVE COCCI IN CLUSTERS AEROBIC BOTTLE ONLY CRITICAL VALUE NOTED.  VALUE IS CONSISTENT WITH PREVIOUSLY REPORTED AND CALLED VALUE.    Culture (A)  Final    STAPHYLOCOCCUS AUREUS SUSCEPTIBILITIES PERFORMED ON PREVIOUS CULTURE WITHIN THE LAST 5 DAYS. Performed at [facility]    Report Status 07/09/2016 FINAL  Final  Urine culture     Status: None   Collection Time: 07/07/16  1:10 AM  Result Value Ref Range Status   Specimen Description URINE, CATHETERIZED  Final   Special Requests NONE  Final   Culture NO GROWTH Performed at [facility]   Final   Report Status 07/08/2016 FINAL  Final  MRSA PCR Screening     Status: Abnormal   Collection Time: 07/07/16  4:12 PM  Result Value Ref Range Status   MRSA by PCR POSITIVE (A) NEGATIVE Final    Comment:        The GeneXpert MRSA Assay (FDA approved for NASAL specimens only), is one component of a comprehensive MRSA colonization surveillance program. It is not intended to diagnose MRSA infection nor to guide or monitor treatment for MRSA infections. RESULT  CALLED TO, READ BACK BY AND VERIFIED WITH: Verdene Rio RN 17:50 07/07/16 (wilsonm)   Respiratory Panel by PCR     Status: None   Collection Time: 07/08/16  6:52 AM  Result Value Ref Range Status   Adenovirus NOT DETECTED NOT DETECTED Final   Coronavirus 229E NOT DETECTED NOT DETECTED Final   Coronavirus HKU1 NOT DETECTED NOT DETECTED Final   Coronavirus NL63 NOT DETECTED NOT DETECTED Final   Coronavirus OC43 NOT DETECTED NOT DETECTED Final   Metapneumovirus NOT DETECTED NOT DETECTED Final   Rhinovirus / Enterovirus NOT DETECTED NOT DETECTED Final   Influenza A NOT DETECTED NOT DETECTED Final   Influenza B NOT DETECTED NOT DETECTED Final   Parainfluenza Virus 1 NOT DETECTED NOT DETECTED Final   Parainfluenza Virus 2 NOT DETECTED NOT DETECTED Final   Parainfluenza Virus 3 NOT DETECTED NOT DETECTED Final   Parainfluenza Virus 4 NOT DETECTED NOT DETECTED Final   Respiratory Syncytial Virus NOT DETECTED NOT DETECTED Final   Bordetella pertussis NOT DETECTED NOT DETECTED Final   Chlamydophila pneumoniae NOT DETECTED NOT DETECTED Final   Mycoplasma pneumoniae NOT DETECTED NOT DETECTED Final  Culture, blood (routine x 2)     Status: None   Collection Time: 07/08/16  9:50 AM  Result Value Ref Range Status   Specimen Description BLOOD RIGHT HAND  Final   Special Requests BOTTLES DRAWN AEROBIC ONLY 5CC  Final   Culture NO GROWTH 5 DAYS  Final   Report Status 07/13/2016 FINAL  Final  Culture, blood (routine x 2)     Status:  Abnormal   Collection Time: 07/08/16 10:00 AM  Result Value Ref Range Status   Specimen Description BLOOD LEFT HAND  Final   Special Requests IN PEDIATRIC BOTTLE 2CC  Final   Culture  Setup Time   Final    IN PEDIATRIC BOTTLE GRAM POSITIVE COCCI IN CLUSTERS CRITICAL RESULT CALLED TO, READ BACK BY AND VERIFIED WITH: J LEDFORD,PHARMD AT 0715 07/09/16 BY L BENFIELD    Culture METHICILLIN RESISTANT STAPHYLOCOCCUS AUREUS (A)  Final   Report Status 07/11/2016 FINAL  Final    Organism ID, Bacteria METHICILLIN RESISTANT STAPHYLOCOCCUS AUREUS  Final      Susceptibility   Methicillin resistant staphylococcus aureus - MIC*    CIPROFLOXACIN <=0.5 SENSITIVE Sensitive     ERYTHROMYCIN >=8 RESISTANT Resistant     GENTAMICIN <=0.5 SENSITIVE Sensitive     OXACILLIN >=4 RESISTANT Resistant     TETRACYCLINE <=1 SENSITIVE Sensitive     VANCOMYCIN 1 SENSITIVE Sensitive     TRIMETH/SULFA <=10 SENSITIVE Sensitive     CLINDAMYCIN <=0.25 SENSITIVE Sensitive     RIFAMPIN <=0.5 SENSITIVE Sensitive     Inducible Clindamycin NEGATIVE Sensitive     * METHICILLIN RESISTANT STAPHYLOCOCCUS AUREUS  Culture, blood (routine x 2)     Status: Abnormal   Collection Time: 07/09/16  6:24 PM  Result Value Ref Range Status   Specimen Description BLOOD LEFT FOREARM  Final   Special Requests IN PEDIATRIC BOTTLE 3CC  Final   Culture  Setup Time   Final    GRAM POSITIVE COCCI IN CLUSTERS IN PEDIATRIC BOTTLE CRITICAL RESULT CALLED TO, READ BACK BY AND VERIFIED WITH: AWynetta Emery [facility]ARM [phone]    Culture (A)  Final    STAPHYLOCOCCUS AUREUS SUSCEPTIBILITIES PERFORMED ON PREVIOUS CULTURE WITHIN THE LAST 5 DAYS.    Report Status 07/11/2016 FINAL  Final  Culture, blood (routine x 2)     Status: None   Collection Time: 07/09/16  6:25 PM  Result Value Ref Range Status   Specimen Description BLOOD LEFT HAND  Final   Special Requests BOTTLES DRAWN AEROBIC ONLY [place]  Final   Culture NO GROWTH 5 DAYS  Final   Report Status 07/15/2016 FINAL  Final  Culture, blood (routine x 2)     Status: None   Collection Time: 07/11/16 10:10 AM  Result Value Ref Range Status   Specimen Description BLOOD LEFT ANTECUBITAL  Final   Special Requests BOTTLES DRAWN AEROBIC ONLY 5CC  Final   Culture NO GROWTH 5 DAYS  Final   Report Status 07/16/2016 FINAL  Final  Culture, blood (routine x 2)     Status: None   Collection Time: 07/11/16 10:15 AM  Result Value Ref Range Status   Specimen Description BLOOD  LEFT ARM  Final   Special Requests BOTTLES DRAWN AEROBIC ONLY 5CC  Final   Culture NO GROWTH 5 DAYS  Final   Report Status 07/16/2016 FINAL  Final  Culture, body fluid-bottle     Status: None   Collection Time: 07/20/16 11:27 AM  Result Value Ref Range Status   Specimen Description PERITONEAL  Final   Special Requests NONE  Final   Culture NO GROWTH 5 DAYS  Final   Report Status 07/25/2016 FINAL  Final  Gram stain     Status: None   Collection Time: 07/20/16 11:27 AM  Result Value Ref Range Status   Specimen Description PERITONEAL  Final   Special Requests NONE  Final   Gram Stain  Final    FEW WBC PRESENT,BOTH PMN AND MONONUCLEAR NO ORGANISMS SEEN    Report Status 07/20/2016 FINAL  Final  Culture, blood (Routine X 2) w Reflex to ID Panel     Status: None   Collection Time: 07/29/16  2:00 PM  Result Value Ref Range Status   Specimen Description BLOOD LEFT ANTECUBITAL  Final   Special Requests BOTTLES DRAWN AEROBIC ONLY 5CC  Final   Culture NO GROWTH 5 DAYS  Final   Report Status 08/03/2016 FINAL  Final  Culture, blood (Routine X 2) w Reflex to ID Panel     Status: None   Collection Time: 07/29/16  2:05 PM  Result Value Ref Range Status   Specimen Description BLOOD LEFT HAND  Final   Special Requests IN PEDIATRIC BOTTLE 2CC  Final   Culture NO GROWTH 5 DAYS  Final   Report Status 08/03/2016 FINAL  Final     Impression/Recommendation  Principal Problem:   MRSA bacteremia Active Problems:   Sepsis ([place])   Cavitary pneumonia   IVDU (intravenous drug user)   Cigarette smoker   ETOH abuse   Hepatitis C antibody test positive   Normocytic anemia   Thrombocytopenia (HCC)   Endocarditis of tricuspid valve   Acute septic pulmonary embolism without acute cor pulmonale (HCC)   Sepsis due to methicillin resistant Staphylococcus aureus (MRSA) (HCC)   Pressure injury of skin   Acute pain of right shoulder   Septic embolism (HCC)   Pyogenic arthritis of right shoulder region  ([place])   Pain   Fever   Anna Colon is a 36 y.o. female with IVDU hx, MRSA bacteremia TV endocarditis with septic emboli to the lungs, MRI suspicious for infected shoulder.   --I will repeat MRI of her shoulder as I think this is likely the source of her ongoing fevers. I would not be surprised if she has developed pyomyositis or progression of her bursitis, and potentially we could see joint involvement, tomorrow will be 6 days since last scan  --continue IV vancomycin  Dr. Johnnye Sima will check in on her over the weekend.   08/04/2016, 6:34 PM   Thank you so much for this interesting consult  [place] [facility] [place] [phone] (pager) [phone] (office) 08/04/2016, 6:34 PM  Rhina Br[place] 08/04/2016, 6:34 PM

## 2016-08-04 NOTE — Progress Notes (Addendum)
PROGRESS NOTE    Anna Colon  ZOX:[id number] DOB: [birth year] DOA: 07/07/2016 PCP: No PCP Per Patient   Brief Narrative: 36 year old female with history of IV drug abuse, recently released from jail, admitted with fevers, workup significant for MRSA bacteremia, cavitary pneumonia, tricuspid endocarditis, dental abscess. Seen by ID with recommendation of IV vancomycin for total of 6 weeks, status post extraction of multiple teeth extraction, had PICC line inserted, continued to spike fever. Had temperature of 101.5 yesterday.  Assessment & Plan:   # Sepsis: Secondary to MRSA bacteremia, TV endocarditis, septic pulmonary emboli and possibly right shoulder septic joint. -  Patient with IV drug abuse which is probably the cause of sepsis. Patient is afebrile now. MRI of the right shoulder consistent with possible septic arthritis.   -IR attempted to drain however no fluid to drain.  -ID changed antibiotics to IV vancomycin. As per ID needs antibiotics through February 19. Needed to pull out PICC line at the end of treatment. Follow-up with ID at the time of discharge. - CT chest consistent with improvement in bilateral cavitary lesions compatible with septic emboli. - Continues to use incentive spirometry - Most recent blood cultures repeated on 1/27, no growth to date, follow up final culture results. -Patient had temperature of 101.5 yesterday. She is clinically stable. I discussed this with Dr. Daiva Eves from infectious disease to evaluate the patient. I will continue to monitor.  # Possible right shoulder septic joint: Ortho consult appreciated.  -s/p IR attempt to aspirate the right shoulder joint, it was unsuccessful. -Tapering pain medications. Patient is not on IV pain medication and not on oral Dilaudid. Continue oxycodone with slow tapering. I will add Motrin as well. -Continue Robaxin for muscle relaxation.  # Dental abscess -  had her teeth pulled giving dental abscess.  #  Pleural effusion - Status post ultrasound-guided thoracentesis 1/18 - Repeat CT chest 1/29 with slight improvement of septic emboli and effusion.  # History of substance abuse - She is counseled - decreased Dilaudid dose to 2 mg every 4 hours.  # Tobacco abuse - Counseled on cessation. nicotine patch.  #ETOH abuse - No signs of withdrawal. Monitor on CIWA. Added low dose clonidine  # Iron deficiency anemia - continue oral iron. 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 Outpatient follow up.  # Sacral pressure ulcer - Wound care consulted  - patient encouraged to ambulate, and use incentive spirometry  Principal Problem:   MRSA bacteremia Active Problems:   Sepsis (HCC)   Cavitary pneumonia   IVDU (intravenous drug user)   Cigarette smoker   ETOH abuse   Hepatitis C antibody test positive   Normocytic anemia   Thrombocytopenia (HCC)   Endocarditis of tricuspid valve   Acute septic pulmonary embolism without acute cor pulmonale (HCC)   Sepsis due to methicillin resistant Staphylococcus aureus (MRSA) (HCC)   Pressure injury of skin   Acute pain of right shoulder   Septic embolism (HCC)   Pyogenic arthritis of right shoulder region (HCC)   Pain  DVT prophylaxis: Lovenox subcutaneous Code Status: Full code Family Communication: No family present at bedside Disposition Plan: Likely discharge to SNF on Monday. I discussed with the social worker regarding discharge planning.    Consultants:   Infectious disease  CT surgery  Orthopedics  Procedures: Teeth extraction 1/23 by Dr. Kristin Bruins Antimicrobials: IV vancomycin.  Subjective: Patient was seen and examined at bedside. Had fever yesterday again. Denied chest pain, shortness  of breath, nausea or vomiting.  Objective: Vitals:   08/03/16 1835 08/03/16 2201 08/04/16 0125 08/04/16 0506  BP: 104/88 102/72  103/63  Pulse: 76 95  75  Resp:  18  16  Temp: 98.4 F (36.9 C) (!) 101.5 F (38.6  C) 98.1 F (36.7 C) 97.5 F (36.4 C)  TempSrc: Oral Oral Oral Oral  SpO2: 98% 97%  100%  Weight:      Height:        Intake/Output Summary (Last 24 hours) at 08/04/16 1509 Last data filed at 08/04/16 1048  Gross per 24 hour  Intake             1560 ml  Output              350 ml  Net             1210 ml   Filed Weights   07/31/16 2058 08/01/16 2043 08/02/16 2130  Weight: 59 kg (130 lb) 54.9 kg (121 lb) 54.1 kg (119 lb 4.8 oz)    Examination:  General exam: Looks better today, sitting on chair, not in distress Respiratory system: Clear bilaterally, no wheezing or crackle Cardiovascular system: Regular rate rhythm, S1-S2 normal. Gastrointestinal system: Abdomen is nondistended, soft and nontender. Normal bowel sounds heard. Central nervous system: Alert, awake, following commands.  Extremities: No lower extremity edema. No cyanosis or clubbing. Skin: No rashes, lesions or ulcers Psychiatry: Judgement and insight appear normal. Mood & affect appropriate.     Data Reviewed: I have personally reviewed following labs and imaging studies  CBC:  Recent Labs Lab 07/29/16 0835 07/30/16 0117 07/30/16 1516 07/31/16 0455  WBC 11.8* 12.8*  --  9.7  NEUTROABS  --   --  8.6*  --   HGB 9.8* 9.8*  --  8.9*  HCT 32.4* 32.3*  --  29.2*  MCV 87.1 86.1  --  85.4  PLT 364 376  --  324   Basic Metabolic Panel:  Recent Labs Lab 07/30/16 0117 07/31/16 0455 08/03/16 1638 08/04/16 0440  NA 131* 132* 135  --   K 4.8 4.4 4.6  --   CL 93* 98* 99*  --   CO2 28 26 27   --   GLUCOSE 122* 104* 103*  --   BUN 9 8 7   --   CREATININE 0.69 0.68 0.59 0.64  CALCIUM 9.3 9.0 9.4  --    GFR: Estimated Creatinine Clearance: 76.2 mL/min (by C-G formula based on SCr of 0.64 mg/dL). Liver Function Tests: No results for input(s): AST, ALT, ALKPHOS, BILITOT, PROT, ALBUMIN in the last 168 hours. No results for input(s): LIPASE, AMYLASE in the last 168 hours. No results for input(s): AMMONIA in  the last 168 hours. Coagulation Profile: No results for input(s): INR, PROTIME in the last 168 hours. Cardiac Enzymes: No results for input(s): CKTOTAL, CKMB, CKMBINDEX, TROPONINI in the last 168 hours. BNP (last 3 results) No results for input(s): PROBNP in the last 8760 hours. HbA1C: No results for input(s): HGBA1C in the last 72 hours. CBG: No results for input(s): GLUCAP in the last 168 hours. Lipid Profile: No results for input(s): CHOL, HDL, LDLCALC, TRIG, CHOLHDL, LDLDIRECT in the last 72 hours. Thyroid Function Tests: No results for input(s): TSH, T4TOTAL, FREET4, T3FREE, THYROIDAB in the last 72 hours. Anemia Panel: No results for input(s): VITAMINB12, FOLATE, FERRITIN, TIBC, IRON, RETICCTPCT in the last 72 hours. Sepsis Labs: No results for input(s): PROCALCITON, LATICACIDVEN in the last 168 hours.  Recent Results (from the past 240 hour(s))  Culture, blood (Routine X 2) w Reflex to ID Panel     Status: None   Collection Time: 07/29/16  2:00 PM  Result Value Ref Range Status   Specimen Description BLOOD LEFT ANTECUBITAL  Final   Special Requests BOTTLES DRAWN AEROBIC ONLY 5CC  Final   Culture NO GROWTH 5 DAYS  Final   Report Status 08/03/2016 FINAL  Final  Culture, blood (Routine X 2) w Reflex to ID Panel     Status: None   Collection Time: 07/29/16  2:05 PM  Result Value Ref Range Status   Specimen Description BLOOD LEFT HAND  Final   Special Requests IN PEDIATRIC BOTTLE 2CC  Final   Culture NO GROWTH 5 DAYS  Final   Report Status 08/03/2016 FINAL  Final         Radiology Studies: No results found.      Scheduled Meds: . cloNIDine  0.1 mg Oral BID  . enoxaparin (LOVENOX) injection  40 mg Subcutaneous Q24H  . feeding supplement (ENSURE ENLIVE)  237 mL Oral TID BM  . feeding supplement (PRO-STAT SUGAR FREE 64)  30 mL Oral BID  . ferrous sulfate  325 mg Oral BID WC  . folic acid  1 mg Oral Daily  . hydrocortisone cream   Topical BID  . iopamidol  20 mL  Intra-articular Once  . lidocaine  1 patch Transdermal Q24H  . meloxicam  15 mg Oral Daily  . methocarbamol  750 mg Oral TID  . multivitamin with minerals  1 tablet Oral Daily  . nicotine  21 mg Transdermal Daily  . sodium chloride  10 mL Other Once  . sodium chloride flush  10-40 mL Intracatheter Q12H  . thiamine  100 mg Oral Daily  . triamcinolone ointment   Topical BID  . vancomycin  1,500 mg Intravenous Q12H   Continuous Infusions: . lactated ringers       LOS: 28 days    Dron Jaynie CollinsPrasad Bhandari, MD [facility] Pager [phone]  If 7PM-7AM, please contact night-coverage [email or web address] Password [email or web address] 08/04/2016, 3:09 PM

## 2016-08-05 ENCOUNTER — Inpatient Hospital Stay (HOSPITAL_COMMUNITY): Payer: Self-pay

## 2016-08-05 DIAGNOSIS — M7551 Bursitis of right shoulder: Secondary | ICD-10-CM

## 2016-08-05 DIAGNOSIS — R52 Pain, unspecified: Secondary | ICD-10-CM

## 2016-08-05 LAB — CBC
HCT: 30.9 % — ABNORMAL LOW (ref 36.0–46.0)
HEMOGLOBIN: 9.7 g/dL — AB (ref 12.0–15.0)
MCH: 26.9 pg (ref 26.0–34.0)
MCHC: 31.4 g/dL (ref 30.0–36.0)
MCV: 85.8 fL (ref 78.0–100.0)
Platelets: 294 10*3/uL (ref 150–400)
RBC: 3.6 MIL/uL — AB (ref 3.87–5.11)
RDW: 17.3 % — ABNORMAL HIGH (ref 11.5–15.5)
WBC: 9.6 10*3/uL (ref 4.0–10.5)

## 2016-08-05 LAB — CREATININE, SERUM
CREATININE: 0.53 mg/dL (ref 0.44–1.00)
GFR calc Af Amer: >60 mL/min (ref >60)
GFR calc non Af Amer: >60 mL/min (ref >60)

## 2016-08-05 LAB — VANCOMYCIN, TROUGH: VANCOMYCIN TR: 26 ug/mL — AB (ref 15–20)

## 2016-08-05 MED ORDER — VANCOMYCIN HCL IN DEXTROSE 1-5 GM/200ML-% IV SOLN
1000.0000 mg | Freq: Two times a day (BID) | INTRAVENOUS | Status: DC
  Administered 2016-08-05 – 2016-08-08 (×4): 1000 mg via INTRAVENOUS
  Filled 2016-08-05 (×7): qty 200

## 2016-08-05 MED ORDER — HYDROMORPHONE HCL 1 MG/ML IJ SOLN
1.0000 mg | Freq: Once | INTRAMUSCULAR | Status: AC
  Administered 2016-08-05: 1 mg via INTRAVENOUS
  Filled 2016-08-05: qty 1

## 2016-08-05 MED ORDER — GADOBENATE DIMEGLUMINE 529 MG/ML IV SOLN
10.0000 mL | Freq: Once | INTRAVENOUS | Status: AC | PRN
  Administered 2016-08-05: 10 mL via INTRAVENOUS

## 2016-08-05 NOTE — Progress Notes (Signed)
Pharmacy Antibiotic Note Anna Colon is a 36 y.o. female with history of IV drug abuse released from jail 07/06/16 who presented on 07/07/16 with nausea, vomiting, and fever, and chillsj. Admitted on 07/07/2016 and found to have MRSA bacteremia and endocarditis with septic emboli. Pt maintained on vancomycin from 1/5 until 1/28 when pt developed pelvic rash and was transitioned to Linezolid.   Vancomycin restarted on 08/01/16  Vanc trough today = 26 mcg/ml on 1500 mg IV q12h  Goal vanc trough = 15-20 mcg/ml  I/O 2320/501, no SCr value today.    Plan: Decrease vancomycin to 1000 mg IV q12h -delay next dose until 12MN tonight.  Check steady state vanc trough after 4th dose.  Will ask lab to run SCr now off lab previously drawn to check renal function.   Height: 5\' 2"  (157.5 cm) Weight: 117 lb 8 oz (53.3 kg) IBW/kg (Calculated) : 50.1  Temp (24hrs), Avg:98.2 F (36.8 C), Min:97.8 F (36.6 C), Max:98.5 F (36.9 C)   Recent Labs Lab 07/30/16 0117 07/31/16 0455 08/03/16 1638 08/04/16 0440 08/05/16 0426 08/05/16 1643  WBC 12.8* 9.7  --   --  9.6  --   CREATININE 0.69 0.68 0.59 0.64  --   --   VANCOTROUGH  --   --  12*  --   --  26*    Estimated Creatinine Clearance: 76.2 mL/min (by C-G formula based on SCr of 0.64 mg/dL).    No Known Allergies  Antimicrobials this admission:  Vanc 1/5 >> 1/28 -developed puritic rash on torso 1/29 >>  Linezolid 1/28>> (2/19) Zosyn 1/5 >> 1/5   Dose adjustments this admission:  1/6 VT 15 - on vanc 750 mg IV q 8 hrs 1/8 VT 18 - on vanc 1g IV q 8 hrs 1/12 VT 24 drawn from PICC line 1.5hr early - will leave dose alone for now 1/16 @ 0415 measured VT @30  mcg/ml (only 5 hours after evening dose) so corrects to ~24 mcg/ml >> will reduce to 750 mg/8h 1/18 VT 16 >> continue Vanc 750 mg/8h 1/21 VT 18 >> continue vanc 750mg /8h 1/23 VT 8 (AM dose got held for dental procedure)-not trough 1/25 VT 22 >> change vanc to 1g/q12h - changed to q12h dosing  to help at discharge. Estimated ke 0.0758 2/3 VT = 26 on 1500 mg IV q12h -- decrease to 1g q12h   Microbiology results:  1/5 BCx: 2/2 MRSA 1/5 BCID - MRSA 1/5 UCx: neg 1/5 MRSA PCR: (+) 1/6 resp panel: neg 1/6 HCV Ab: 11, viral load not calc- spontaneous clearing per ID 1/6 BCx: 1/2 MRSA 1/7 BCx:1/2 staph aureus 1/9 Bcx: neg 1/18 peritoneal fluid: neg 1/27 BCx: neg   Anna Colon, RPh Clinical Pharmacist Pager: [phone] 08/05/2016 5:45 PM

## 2016-08-05 NOTE — Progress Notes (Signed)
INFECTIOUS DISEASE PROGRESS NOTE  ID: [name] is a 36 y.o. female with  Principal Problem:   MRSA bacteremia Active Problems:   Sepsis (HCC)   Cavitary pneumonia   IVDU (intravenous drug user)   Cigarette smoker   ETOH abuse   Hepatitis C antibody test positive   Normocytic anemia   Thrombocytopenia (HCC)   Endocarditis of tricuspid valve   Acute septic pulmonary embolism without acute cor pulmonale (HCC)   Sepsis due to methicillin resistant Staphylococcus aureus (MRSA) (HCC)   Pressure injury of skin   Acute pain of right shoulder   Septic embolism (HCC)   Pyogenic arthritis of right shoulder region (HCC)   Pain   FUO (fever of unknown origin)   Polysubstance abuse   Bursitis of right shoulder   Pain management  Subjective: Continued shoulder pain.  " I don't want surgery!"  Abtx:  Anti-infectives    Start     Dose/Rate Route Frequency Ordered Stop   08/04/16 0500  vancomycin (VANCOCIN) 1,500 mg in sodium chloride 0.9 % 500 mL IVPB     1,500 mg 250 mL/hr over 120 Minutes Intravenous Every 12 hours 08/03/16 1737     08/02/16 0500  vancomycin (VANCOCIN) IVPB 1000 mg/200 mL premix  Status:  Discontinued     1,000 mg 100 mL/hr over 120 Minutes Intravenous Every 12 hours 08/01/16 1609 08/03/16 1737   08/01/16 1545  vancomycin (VANCOCIN) IVPB 1000 mg/200 mL premix     1,000 mg 200 mL/hr over 60 Minutes Intravenous  Once 08/01/16 1540 08/01/16 1810   07/30/16 1500  linezolid (ZYVOX) IVPB 600 mg  Status:  Discontinued     600 mg 300 mL/hr over 60 Minutes Intravenous Every 12 hours 07/30/16 1410 08/01/16 1531   07/29/16 2200  vancomycin (VANCOCIN) IVPB 1000 mg/200 mL premix  Status:  Discontinued     1,000 mg 200 mL/hr over 60 Minutes Intravenous Every 12 hours 07/29/16 1016 07/30/16 1411   07/27/16 2100  vancomycin (VANCOCIN) 1,250 mg in sodium chloride 0.9 % 250 mL IVPB  Status:  Discontinued     1,250 mg 166.7 mL/hr over 90 Minutes Intravenous Every 12 hours  07/27/16 1235 07/29/16 1016   07/26/16 2030  vancomycin (VANCOCIN) IVPB 750 mg/150 ml premix  Status:  Discontinued     750 mg 150 mL/hr over 60 Minutes Intravenous Every 8 hours 07/25/16 2011 07/25/16 2040   07/25/16 2040  vancomycin (VANCOCIN) IVPB 750 mg/150 ml premix  Status:  Discontinued     750 mg 150 mL/hr over 60 Minutes Intravenous Every 8 hours 07/25/16 2040 07/27/16 1235   07/18/16 1200  vancomycin (VANCOCIN) IVPB 750 mg/150 ml premix  Status:  Discontinued     750 mg 150 mL/hr over 60 Minutes Intravenous Every 8 hours 07/18/16 1002 07/25/16 2011   07/08/16 1515  vancomycin (VANCOCIN) IVPB 1000 mg/200 mL premix  Status:  Discontinued     1,000 mg 200 mL/hr over 60 Minutes Intravenous Every 8 hours 07/08/16 1501 07/18/16 0556   07/07/16 2200  piperacillin-tazobactam (ZOSYN) IVPB 3.375 g  Status:  Discontinued     3.375 g 12.5 mL/hr over 240 Minutes Intravenous Every 8 hours 07/07/16 1605 07/07/16 1754   07/07/16 1615  vancomycin (VANCOCIN) IVPB 750 mg/150 ml premix  Status:  Discontinued     750 mg 150 mL/hr over 60 Minutes Intravenous Every 8 hours 07/07/16 1605 07/08/16 1501   07/07/16 0813  piperacillin-tazobactam (ZOSYN) IVPB 3.375 g  3.375 g 100 mL/hr over 30 Minutes Intravenous  Once 07/07/16 0644 07/07/16 0839   07/07/16 0215  piperacillin-tazobactam (ZOSYN) IVPB 3.375 g     3.375 g 100 mL/hr over 30 Minutes Intravenous  Once 07/07/16 0213 07/07/16 0313   07/07/16 0215  vancomycin (VANCOCIN) IVPB 1000 mg/200 mL premix     1,000 mg 200 mL/hr over 60 Minutes Intravenous  Once 07/07/16 0213 07/07/16 0343      Medications:  Scheduled: . cloNIDine  0.1 mg Oral BID  . enoxaparin (LOVENOX) injection  40 mg Subcutaneous Q24H  . famotidine  20 mg Oral Daily  . feeding supplement (ENSURE ENLIVE)  237 mL Oral TID BM  . feeding supplement (PRO-STAT SUGAR FREE 64)  30 mL Oral BID  . ferrous sulfate  325 mg Oral BID WC  . folic acid  1 mg Oral Daily  . hydrocortisone  cream   Topical BID  . ibuprofen  400 mg Oral BID  . iopamidol  20 mL Intra-articular Once  . lidocaine  1 patch Transdermal Q24H  . meloxicam  15 mg Oral Daily  . methocarbamol  750 mg Oral TID  . multivitamin with minerals  1 tablet Oral Daily  . nicotine  21 mg Transdermal Daily  . sodium chloride  10 mL Other Once  . sodium chloride flush  10-40 mL Intracatheter Q12H  . thiamine  100 mg Oral Daily  . triamcinolone ointment   Topical BID  . vancomycin  1,500 mg Intravenous Q12H    Objective: Vital signs in last 24 hours: Temp:  [97.8 F (36.6 C)-98.5 F (36.9 C)] 97.8 F (36.6 C) (02/03 0946) Pulse Rate:  [64-82] 82 (02/03 0946) Resp:  [16-17] 17 (02/03 0946) BP: (98-117)/(54-70) 98/61 (02/03 0946) SpO2:  [100 %] 100 % (02/03 0946) Weight:  [53.3 kg (117 lb 8 oz)] 53.3 kg (117 lb 8 oz) (02/02 2130)   General appearance: alert, cooperative and no distress Resp: clear to auscultation bilaterally Cardio: regular rate and rhythm GI: normal findings: bowel sounds normal and soft, non-tender Extremities: R shoulder- no heat, no swelling, no tenderness.   Lab Results  Recent Labs  08/03/16 1638 08/04/16 0440 08/05/16 0426  WBC  --   --  9.6  HGB  --   --  9.7*  HCT  --   --  30.9*  NA 135  --   --   K 4.6  --   --   CL 99*  --   --   CO2 27  --   --   BUN 7  --   --   CREATININE 0.59 0.64  --    Liver Panel No results for input(s): PROT, ALBUMIN, AST, ALT, ALKPHOS, BILITOT, BILIDIR, IBILI in the last 72 hours. Sedimentation Rate No results for input(s): ESRSEDRATE in the last 72 hours. C-Reactive Protein No results for input(s): CRP in the last 72 hours.  Microbiology: Recent Results (from the past 240 hour(s))  Culture, blood (Routine X 2) w Reflex to ID Panel     Status: None   Collection Time: 07/29/16  2:00 PM  Result Value Ref Range Status   Specimen Description BLOOD LEFT ANTECUBITAL  Final   Special Requests BOTTLES DRAWN AEROBIC ONLY 5CC  Final    Culture NO GROWTH 5 DAYS  Final   Report Status 08/03/2016 FINAL  Final  Culture, blood (Routine X 2) w Reflex to ID Panel     Status: None   Collection Time:  07/29/16  2:05 PM  Result Value Ref Range Status   Specimen Description BLOOD LEFT HAND  Final   Special Requests IN PEDIATRIC BOTTLE [facility]  Final   Culture NO GROWTH 5 DAYS  Final   Report Status 08/03/2016 FINAL  Final    Studies/Results: Mr Shoulder Right W Wo Contrast  Result Date: 08/05/2016 CLINICAL DATA:  Followup complex subacromial/ subdeltoid bursitis. EXAM: MRI OF THE RIGHT SHOULDER WITHOUT AND WITH CONTRAST TECHNIQUE: Multiplanar, multisequence MR imaging of the right shoulder was performed before and after the administration of intravenous contrast. CONTRAST:  10mL MULTIHANCE GADOBENATE DIMEGLUMINE 529 MG/ML IV SOLN COMPARISON:  MRI 07/30/2016 FINDINGS: Rotator cuff: The rotator cuff tendons are intact. No significant tendinopathy or tear. Muscles: Mild edema like signal abnormality involving the supraspinous and infraspinatus muscles suggesting mild myositis. This has improved since the prior MRI. Biceps long head:  Intact Acromioclavicular Joint: No degenerative changes. No findings for septic arthritis for osteomyelitis. Glenohumeral Joint: Small glenohumeral joint effusion but no findings suspicious for septic arthritis. Labrum:  Intact. Bones: No acute bony findings. No worrisome marrow edema or worrisome enhancement to suggest osteomyelitis. Other: Persistent but improved subacromial/subdeltoid bursitis. Diffuse enhancement of the bursa suggesting inflammation/inflammatory phlegmon. There is a small fluid collection in the bursa measuring 15 x 4 mm. This previously measured 16 x 15 mm. IMPRESSION: 1. Persistent but improved subacromial/subdeltoid bursitis with diffuse enhancement and a small focal fluid collection. 2. Improved myositis involving the supraspinous and infraspinatus muscles. 3. No MR findings suspicious for septic  arthritis involving the [facility] joint or glenohumeral joint. Electronically Signed   By: Rudie Meyer M.D.   On: 08/05/2016 11:59     Assessment/Plan: Fever MRSA TV IE Septic emboli IVDA R shoulder bursitis Hepatitis C immune  MRI shows her bursitis/myositis is improved but not resolved.  Has defervesced over last 48h Dr Daiva Eves will see again on 2-5, please let me know if questions on 2-4  Total days of antibiotics: 29 (vanco)         Johny Sax Infectious Diseases (pager) [phone] [email or web address] 08/05/2016, 12:34 PM  LOS: 29 days

## 2016-08-05 NOTE — Progress Notes (Signed)
PROGRESS NOTE    Colman CaterConstance Fallert  ZOX:[id number] DOB: 23-Jun-1981 DOA: 07/07/2016 PCP: No PCP Per Patient   Brief Narrative: 36 year old female with history of IV drug abuse, recently released from jail, admitted with fevers, workup significant for MRSA bacteremia, cavitary pneumonia, tricuspid endocarditis, dental abscess. Seen by ID with recommendation of IV vancomycin for total of 6 weeks, status post extraction of multiple teeth extraction, had PICC line inserted, continued to spike fever. Had temperature of 101.5 on 08/03/2016.  Assessment & Plan:   # Sepsis: Secondary to MRSA bacteremia, TV endocarditis, septic pulmonary emboli and possibly right shoulder septic joint. -Currently on IV vancomycin. As per ID needs antibiotics through February 19. Needed to pull out PICC line at the end of treatment.  -Reevaluated by ID because of fever. Plan for MRI of shoulder today. Patient complaining of pain and therefore I ordered a dose of IV Dilaudid before the MRI test. Patient is on oral oxycodone, Motrin and Robaxin. Also discussed with the nurse regarding medications. - CT chest consistent with improvement in bilateral cavitary lesions compatible with septic emboli. - Continues to use incentive spirometry - Most recent blood cultures repeated on 1/27, no growth to date, follow up final culture results. -Patient is afebrile now.  # Possible right shoulder septic joint: Ortho consult appreciated.  -s/p IR attempt to aspirate the right shoulder joint, it was unsuccessful. -Planned for another MRI of right shoulder today because of recurrent fever  as per Id.  #Pain management: -Continue Motrin twice a day, Robaxin 3 times a day. -On oxycodone 10 mg as needed. -1 dose of Dilaudid before the MRI today.  # Dental abscess -  had her teeth pulled giving dental abscess.  # Pleural effusion - Status post ultrasound-guided thoracentesis 1/18 - Repeat CT chest 1/29 with slight improvement of  septic emboli and effusion.  # History of substance abuse - She is counseled - decreased Dilaudid dose to 2 mg every 4 hours.  # Tobacco abuse - Counseled on cessation. nicotine patch.  #ETOH abuse - No signs of withdrawal. Monitor on CIWA. Added low dose clonidine  # Iron deficiency anemia - continue oral iron. 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 Outpatient follow up.  # Sacral pressure ulcer - Wound care consulted  - patient encouraged to ambulate, and use incentive spirometry  Principal Problem:   MRSA bacteremia Active Problems:   Sepsis (HCC)   Cavitary pneumonia   IVDU (intravenous drug user)   Cigarette smoker   ETOH abuse   Hepatitis C antibody test positive   Normocytic anemia   Thrombocytopenia (HCC)   Endocarditis of tricuspid valve   Acute septic pulmonary embolism without acute cor pulmonale (HCC)   Sepsis due to methicillin resistant Staphylococcus aureus (MRSA) (HCC)   Pressure injury of skin   Acute pain of right shoulder   Septic embolism (HCC)   Pyogenic arthritis of right shoulder region (HCC)   Pain   FUO (fever of unknown origin)   Polysubstance abuse   Bursitis of right shoulder  DVT prophylaxis: Lovenox subcutaneous Code Status: Full code Family Communication: No family present at bedside Disposition Plan: Likely discharge to SNF on Monday. I discussed with the social worker regarding discharge planning. SNF will be able to accept the patient on Monday.    Consultants:   Infectious disease  CT surgery  Orthopedics  Procedures: Teeth extraction 1/23 by Dr. Kristin BruinsKulinski Antimicrobials: IV vancomycin.  Subjective: Patient was seen and examined  at bedside. Afebrile now. Has a stable right shoulder pain. Denied headache, dizziness, nausea vomiting chest pain or shortness of breath. Objective: Vitals:   08/04/16 1700 08/04/16 2130 08/05/16 0622 08/05/16 0946  BP: (!) 110/55 (!) 104/54 117/70 98/61  Pulse: 80  74 64 82  Resp: 16 17 16 17   Temp: 98.5 F (36.9 C) 98.1 F (36.7 C) 98.5 F (36.9 C) 97.8 F (36.6 C)  TempSrc: Oral Oral Oral Oral  SpO2: 100% 100% 100% 100%  Weight:  53.3 kg (117 lb 8 oz)    Height:        Intake/Output Summary (Last 24 hours) at 08/05/16 1138 Last data filed at 08/05/16 0930  Gross per 24 hour  Intake             2320 ml  Output              351 ml  Net             1969 ml   Filed Weights   08/01/16 2043 08/02/16 2130 08/04/16 2130  Weight: 54.9 kg (121 lb) 54.1 kg (119 lb 4.8 oz) 53.3 kg (117 lb 8 oz)    Examination:  General exam: Not in distress, lying in bed comfortable Respiratory system: Clear bilateral no wheezing or crackle Cardiovascular system: Regular rate rhythm, S1-S2 normal. Gastrointestinal system: Abdomen is nondistended, soft and nontender. Normal bowel sounds heard. Central nervous system: Alert, awake, following commands.  Extremities: No lower extremity edema. No cyanosis or clubbing. Skin: No rashes, lesions or ulcers Psychiatry: Judgement and insight appear normal. Mood & affect appropriate.     Data Reviewed: I have personally reviewed following labs and imaging studies  CBC:  Recent Labs Lab 07/30/16 0117 07/30/16 1516 07/31/16 0455 08/05/16 0426  WBC 12.8*  --  9.7 9.6  NEUTROABS  --  8.6*  --   --   HGB 9.8*  --  8.9* 9.7*  HCT 32.3*  --  29.2* 30.9*  MCV 86.1  --  85.4 85.8  PLT 376  --  324 294   Basic Metabolic Panel:  Recent Labs Lab 07/30/16 0117 07/31/16 0455 08/03/16 1638 08/04/16 0440  NA 131* 132* 135  --   K 4.8 4.4 4.6  --   CL 93* 98* 99*  --   CO2 28 26 27   --   GLUCOSE 122* 104* 103*  --   BUN 9 8 7   --   CREATININE 0.69 0.68 0.59 0.64  CALCIUM 9.3 9.0 9.4  --    GFR: Estimated Creatinine Clearance: 76.2 mL/min (by C-G formula based on SCr of 0.64 mg/dL). Liver Function Tests: No results for input(s): AST, ALT, ALKPHOS, BILITOT, PROT, ALBUMIN in the last 168 hours. No results for  input(s): LIPASE, AMYLASE in the last 168 hours. No results for input(s): AMMONIA in the last 168 hours. Coagulation Profile: No results for input(s): INR, PROTIME in the last 168 hours. Cardiac Enzymes: No results for input(s): CKTOTAL, CKMB, CKMBINDEX, TROPONINI in the last 168 hours. BNP (last 3 results) No results for input(s): PROBNP in the last 8760 hours. HbA1C: No results for input(s): HGBA1C in the last 72 hours. CBG: No results for input(s): GLUCAP in the last 168 hours. Lipid Profile: No results for input(s): CHOL, HDL, LDLCALC, TRIG, CHOLHDL, LDLDIRECT in the last 72 hours. Thyroid Function Tests: No results for input(s): TSH, T4TOTAL, FREET4, T3FREE, THYROIDAB in the last 72 hours. Anemia Panel: No results for input(s): VITAMINB12, FOLATE, FERRITIN,  TIBC, IRON, RETICCTPCT in the last 72 hours. Sepsis Labs: No results for input(s): PROCALCITON, LATICACIDVEN in the last 168 hours.  Recent Results (from the past 240 hour(s))  Culture, blood (Routine X 2) w Reflex to ID Panel     Status: None   Collection Time: 07/29/16  2:00 PM  Result Value Ref Range Status   Specimen Description BLOOD LEFT ANTECUBITAL  Final   Special Requests BOTTLES DRAWN AEROBIC ONLY 5CC  Final   Culture NO GROWTH 5 DAYS  Final   Report Status 08/03/2016 FINAL  Final  Culture, blood (Routine X 2) w Reflex to ID Panel     Status: None   Collection Time: 07/29/16  2:05 PM  Result Value Ref Range Status   Specimen Description BLOOD LEFT HAND  Final   Special Requests IN PEDIATRIC BOTTLE 2CC  Final   Culture NO GROWTH 5 DAYS  Final   Report Status 08/03/2016 FINAL  Final         Radiology Studies: No results found.      Scheduled Meds: . cloNIDine  0.1 mg Oral BID  . enoxaparin (LOVENOX) injection  40 mg Subcutaneous Q24H  . famotidine  20 mg Oral Daily  . feeding supplement (ENSURE ENLIVE)  237 mL Oral TID BM  . feeding supplement (PRO-STAT SUGAR FREE 64)  30 mL Oral BID  . ferrous  sulfate  325 mg Oral BID WC  . folic acid  1 mg Oral Daily  . hydrocortisone cream   Topical BID  . ibuprofen  400 mg Oral BID  . iopamidol  20 mL Intra-articular Once  . lidocaine  1 patch Transdermal Q24H  . meloxicam  15 mg Oral Daily  . methocarbamol  750 mg Oral TID  . multivitamin with minerals  1 tablet Oral Daily  . nicotine  21 mg Transdermal Daily  . sodium chloride  10 mL Other Once  . sodium chloride flush  10-40 mL Intracatheter Q12H  . thiamine  100 mg Oral Daily  . triamcinolone ointment   Topical BID  . vancomycin  1,500 mg Intravenous Q12H   Continuous Infusions: . lactated ringers       LOS: 29 days    Jordyn Doane Jaynie Collins, MD [facility] Pager [phone]  If 7PM-7AM, please contact night-coverage [email or web address] Password [facility] 08/05/2016, 11:38 AM

## 2016-08-06 MED ORDER — IBUPROFEN 400 MG PO TABS
400.0000 mg | ORAL_TABLET | Freq: Three times a day (TID) | ORAL | Status: DC
  Administered 2016-08-06 – 2016-08-08 (×6): 400 mg via ORAL
  Filled 2016-08-06 (×6): qty 1

## 2016-08-06 NOTE — Progress Notes (Signed)
PROGRESS NOTE    Anna Colon  ZOX:[id number] DOB: 07-18-1980 DOA: 07/07/2016 PCP: No PCP Per Patient   Brief Narrative: 36 year old female with history of IV drug abuse, recently released from jail, admitted with fevers, workup significant for MRSA bacteremia, cavitary pneumonia, tricuspid endocarditis, dental abscess. Seen by ID with recommendation of IV vancomycin for total of 6 weeks, status post extraction of multiple teeth extraction, had PICC line inserted, continued to spike fever. Had temperature of 101.5 on 08/03/2016.  Assessment & Plan:   # Sepsis: Secondary to MRSA bacteremia, TV endocarditis, septic pulmonary emboli and possibly right shoulder septic joint. -Currently on IV vancomycin. As per ID needs antibiotics through February 19. Needed to pull out PICC line at the end of treatment.  -Reevaluated by ID because of fever. MRI of the shoulder repeated which was consistent with a persistent bursitis but improved myositis and no evidence of septic arthritis. - CT chest consistent with improvement in bilateral cavitary lesions compatible with septic emboli. - Continues to use incentive spirometry - Most recent blood cultures from 1/27 negative. -Patient is afebrile now. Continue to monitor fever curve.  # Possible right shoulder septic joint: Ortho consult appreciated.  -s/p IR attempt to aspirate the right shoulder joint, it was unsuccessful.  #Pain management: -10 medication adjusted today. Increase the dose of Motrin to 3 times a day. Continue Robaxin 3 times a day. Currently on Robaxin 10 mg as needed with plan to taper down. Not on IV pain medications. Continue supportive care.  # Dental abscess -  had her teeth pulled giving dental abscess.  # Pleural effusion - Status post ultrasound-guided thoracentesis 1/18 - Repeat CT chest 1/29 with slight improvement of septic emboli and effusion.  # History of substance abuse - She is counseled - decreased Dilaudid dose  to 2 mg every 4 hours.  # Tobacco abuse - Counseled on cessation. nicotine patch.  #ETOH abuse - No signs of withdrawal. Monitor on CIWA. Added low dose clonidine  # Iron deficiency anemia - continue oral iron. 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 Outpatient follow up.  # Sacral pressure ulcer - Wound care consulted  - patient encouraged to ambulate, and use incentive spirometry  Principal Problem:   MRSA bacteremia Active Problems:   Sepsis (HCC)   Cavitary pneumonia   IVDU (intravenous drug user)   Cigarette smoker   ETOH abuse   Hepatitis C antibody test positive   Normocytic anemia   Thrombocytopenia (HCC)   Endocarditis of tricuspid valve   Acute septic pulmonary embolism without acute cor pulmonale (HCC)   Sepsis due to methicillin resistant Staphylococcus aureus (MRSA) (HCC)   Pressure injury of skin   Acute pain of right shoulder   Septic embolism (HCC)   Pyogenic arthritis of right shoulder region (HCC)   Pain   FUO (fever of unknown origin)   Polysubstance abuse   Bursitis of right shoulder   Pain management  DVT prophylaxis: Lovenox subcutaneous Code Status: Full code Family Communication: No family present at bedside Disposition Plan: Likely discharge to SNF on Monday.    Consultants:   Infectious disease  CT surgery  Orthopedics  Procedures: Teeth extraction 1/23 by Dr. Kristin Bruins Antimicrobials: IV vancomycin.  Subjective: Patient was seen and examined at bedside. No new event. Denied chest pain, shortness of breath, nausea or vomiting. Objective: Vitals:   08/05/16 1723 08/05/16 2038 08/06/16 0530 08/06/16 0937  BP: 101/62 97/62 105/71 114/67  Pulse: 75 85  74 91  Resp: 18 18 16 17   Temp: 98.3 F (36.8 C) 98.6 F (37 C) 97.6 F (36.4 C) 98.5 F (36.9 C)  TempSrc: Oral Oral Oral Oral  SpO2: 100% 98% 100% 100%  Weight:  53.1 kg (117 lb 1.6 oz)    Height:  5\' 2"  (1.575 m)      Intake/Output Summary (Last  24 hours) at 08/06/16 1146 Last data filed at 08/06/16 [id number]  Gross per 24 hour  Intake             1980 ml  Output                0 ml  Net             1980 ml   Filed Weights   08/02/16 2130 08/04/16 2130 08/05/16 2038  Weight: 54.1 kg (119 lb 4.8 oz) 53.3 kg (117 lb 8 oz) 53.1 kg (117 lb 1.6 oz)    Examination:  General exam: In distress, lying in bed comfortable. Respiratory system: Clear bilateral, no wheezing or crackle Cardiovascular system: Regular rate rhythm, S1 is normal, no murmur appreciated Gastrointestinal system: Abdomen is nondistended, soft and nontender. Normal bowel sounds heard. Central nervous system: Alert, awake, following commands.  Extremities: No lower extremity edema. No cyanosis or clubbing. Skin: No rashes, lesions or ulcers Psychiatry: Judgement and insight appear normal. Mood & affect appropriate.     Data Reviewed: I have personally reviewed following labs and imaging studies  CBC:  Recent Labs Lab 07/30/16 1516 07/31/16 0455 08/05/16 0426  WBC  --  9.7 9.6  NEUTROABS 8.6*  --   --   HGB  --  8.9* 9.7*  HCT  --  29.2* 30.9*  MCV  --  85.4 85.8  PLT  --  324 294   Basic Metabolic Panel:  Recent Labs Lab 07/31/16 0455 08/03/16 1638 08/04/16 0440 08/05/16 1643  NA 132* 135  --   --   K 4.4 4.6  --   --   CL 98* 99*  --   --   CO2 26 27  --   --   GLUCOSE 104* 103*  --   --   BUN 8 7  --   --   CREATININE 0.68 0.59 0.64 0.53  CALCIUM 9.0 9.4  --   --    GFR: Estimated Creatinine Clearance: 76.2 mL/min (by C-G formula based on SCr of 0.53 mg/dL). Liver Function Tests: No results for input(s): AST, ALT, ALKPHOS, BILITOT, PROT, ALBUMIN in the last 168 hours. No results for input(s): LIPASE, AMYLASE in the last 168 hours. No results for input(s): AMMONIA in the last 168 hours. Coagulation Profile: No results for input(s): INR, PROTIME in the last 168 hours. Cardiac Enzymes: No results for input(s): CKTOTAL, CKMB, CKMBINDEX,  TROPONINI in the last 168 hours. BNP (last 3 results) No results for input(s): PROBNP in the last 8760 hours. HbA1C: No results for input(s): HGBA1C in the last 72 hours. CBG: No results for input(s): GLUCAP in the last 168 hours. Lipid Profile: No results for input(s): CHOL, HDL, LDLCALC, TRIG, CHOLHDL, LDLDIRECT in the last 72 hours. Thyroid Function Tests: No results for input(s): TSH, T4TOTAL, FREET4, T3FREE, THYROIDAB in the last 72 hours. Anemia Panel: No results for input(s): VITAMINB12, FOLATE, FERRITIN, TIBC, IRON, RETICCTPCT in the last 72 hours. Sepsis Labs: No results for input(s): PROCALCITON, LATICACIDVEN in the last 168 hours.  Recent Results (from the past 240 hour(s))  Culture,  blood (Routine X 2) w Reflex to ID Panel     Status: None   Collection Time: 07/29/16  2:00 PM  Result Value Ref Range Status   Specimen Description BLOOD LEFT ANTECUBITAL  Final   Special Requests BOTTLES DRAWN AEROBIC ONLY 5CC  Final   Culture NO GROWTH 5 DAYS  Final   Report Status 08/03/2016 FINAL  Final  Culture, blood (Routine X 2) w Reflex to ID Panel     Status: None   Collection Time: 07/29/16  2:05 PM  Result Value Ref Range Status   Specimen Description BLOOD LEFT HAND  Final   Special Requests IN PEDIATRIC BOTTLE [facility]  Final   Culture NO GROWTH 5 DAYS  Final   Report Status 08/03/2016 FINAL  Final         Radiology Studies: Mr Shoulder Right W Wo Contrast  Result Date: 08/05/2016 CLINICAL DATA:  Followup complex subacromial/ subdeltoid bursitis. EXAM: MRI OF THE RIGHT SHOULDER WITHOUT AND WITH CONTRAST TECHNIQUE: Multiplanar, multisequence MR imaging of the right shoulder was performed before and after the administration of intravenous contrast. CONTRAST:  10mL MULTIHANCE GADOBENATE DIMEGLUMINE 529 MG/ML IV SOLN COMPARISON:  MRI 07/30/2016 FINDINGS: Rotator cuff: The rotator cuff tendons are intact. No significant tendinopathy or tear. Muscles: Mild edema like signal  abnormality involving the supraspinous and infraspinatus muscles suggesting mild myositis. This has improved since the prior MRI. Biceps long head:  Intact Acromioclavicular Joint: No degenerative changes. No findings for septic arthritis for osteomyelitis. Glenohumeral Joint: Small glenohumeral joint effusion but no findings suspicious for septic arthritis. Labrum:  Intact. Bones: No acute bony findings. No worrisome marrow edema or worrisome enhancement to suggest osteomyelitis. Other: Persistent but improved subacromial/subdeltoid bursitis. Diffuse enhancement of the bursa suggesting inflammation/inflammatory phlegmon. There is a small fluid collection in the bursa measuring 15 x 4 mm. This previously measured 16 x 15 mm. IMPRESSION: 1. Persistent but improved subacromial/subdeltoid bursitis with diffuse enhancement and a small focal fluid collection. 2. Improved myositis involving the supraspinous and infraspinatus muscles. 3. No MR findings suspicious for septic arthritis involving the [facility] joint or glenohumeral joint. Electronically Signed   By: Rudie Meyer M.D.   On: 08/05/2016 11:59        Scheduled Meds: . cloNIDine  0.1 mg Oral BID  . enoxaparin (LOVENOX) injection  40 mg Subcutaneous Q24H  . famotidine  20 mg Oral Daily  . feeding supplement (ENSURE ENLIVE)  237 mL Oral TID BM  . feeding supplement (PRO-STAT SUGAR FREE 64)  30 mL Oral BID  . ferrous sulfate  325 mg Oral BID WC  . folic acid  1 mg Oral Daily  . hydrocortisone cream   Topical BID  . ibuprofen  400 mg Oral TID  . iopamidol  20 mL Intra-articular Once  . lidocaine  1 patch Transdermal Q24H  . meloxicam  15 mg Oral Daily  . methocarbamol  750 mg Oral TID  . multivitamin with minerals  1 tablet Oral Daily  . nicotine  21 mg Transdermal Daily  . sodium chloride  10 mL Other Once  . sodium chloride flush  10-40 mL Intracatheter Q12H  . thiamine  100 mg Oral Daily  . triamcinolone ointment   Topical BID  . vancomycin   1,000 mg Intravenous Q12H   Continuous Infusions: . lactated ringers       LOS: 30 days    Justina Bertini Jaynie Collins, MD [facility] Pager [phone]  If 7PM-7AM, please contact night-coverage [email or web address]  Password [email or web address] 08/06/2016, 11:46 AM

## 2016-08-07 ENCOUNTER — Inpatient Hospital Stay (HOSPITAL_COMMUNITY): Payer: Self-pay

## 2016-08-07 DIAGNOSIS — R5381 Other malaise: Secondary | ICD-10-CM

## 2016-08-07 DIAGNOSIS — M545 Low back pain: Secondary | ICD-10-CM

## 2016-08-07 DIAGNOSIS — M549 Dorsalgia, unspecified: Secondary | ICD-10-CM

## 2016-08-07 MED ORDER — LORAZEPAM 2 MG/ML IJ SOLN
INTRAMUSCULAR | Status: AC
  Filled 2016-08-07: qty 1

## 2016-08-07 MED ORDER — NICOTINE 21 MG/24HR TD PT24: 21.0000 mg | MEDICATED_PATCH | Freq: Every day | TRANSDERMAL | 0 refills | Status: DC

## 2016-08-07 MED ORDER — CLONIDINE HCL 0.1 MG PO TABS: 0.1000 mg | ORAL_TABLET | Freq: Two times a day (BID) | ORAL | 0 refills | Status: DC

## 2016-08-07 MED ORDER — FERROUS SULFATE 325 (65 FE) MG PO TABS: 325.0000 mg | ORAL_TABLET | Freq: Two times a day (BID) | ORAL | 0 refills | Status: DC

## 2016-08-07 MED ORDER — METHOCARBAMOL 750 MG PO TABS: 750.0000 mg | ORAL_TABLET | Freq: Three times a day (TID) | ORAL | Status: DC

## 2016-08-07 MED ORDER — LORAZEPAM BOLUS VIA INFUSION: 2.0000 mg | INTRAVENOUS | Status: DC | PRN

## 2016-08-07 MED ORDER — LORAZEPAM 2 MG/ML IJ SOLN
2.0000 mg | INTRAMUSCULAR | Status: DC | PRN
Start: 2016-08-07 — End: 2016-08-08
  Administered 2016-08-07: 2 mg via INTRAVENOUS

## 2016-08-07 MED ORDER — OXYCODONE HCL 10 MG PO TABS: 10.0000 mg | ORAL_TABLET | Freq: Four times a day (QID) | ORAL | 0 refills | Status: DC | PRN

## 2016-08-07 MED ORDER — ACETAMINOPHEN 325 MG PO TABS: 650.0000 mg | ORAL_TABLET | Freq: Four times a day (QID) | ORAL | Status: AC | PRN

## 2016-08-07 MED ORDER — VANCOMYCIN IV (FOR PTA / DISCHARGE USE ONLY): 1000.0000 mg | Freq: Two times a day (BID) | INTRAVENOUS | 0 refills | Status: DC

## 2016-08-07 MED ORDER — POLYETHYLENE GLYCOL 3350 17 G PO PACK: 17.0000 g | PACK | Freq: Every day | ORAL | 0 refills | Status: DC | PRN

## 2016-08-07 MED ORDER — FAMOTIDINE 20 MG PO TABS: 20.0000 mg | ORAL_TABLET | Freq: Every day | ORAL | Status: DC

## 2016-08-07 MED ORDER — ENSURE ENLIVE PO LIQD: 237.0000 mL | Freq: Two times a day (BID) | ORAL | Status: DC

## 2016-08-07 MED ORDER — ADULT MULTIVITAMIN W/MINERALS CH: 1.0000 | ORAL_TABLET | Freq: Every day | ORAL | Status: DC

## 2016-08-07 MED ORDER — FOLIC ACID 1 MG PO TABS: 1.0000 mg | ORAL_TABLET | Freq: Every day | ORAL | Status: DC

## 2016-08-07 NOTE — Progress Notes (Signed)
Subjective: Patient complaining of generalized malaise and also new lower back pain her nurse is also concerned about her appearance today compared to last week   Antibiotics:  Anti-infectives    Start     Dose/Rate Route Frequency Ordered Stop   08/07/16 0000  vancomycin IVPB     1,000 mg Intravenous Every 12 hours 08/07/16 1051 08/21/16 2359   08/06/16 0000  vancomycin (VANCOCIN) IVPB 1000 mg/200 mL premix     1,000 mg 200 mL/hr over 60 Minutes Intravenous Every 12 hours 08/05/16 1802     08/04/16 0500  vancomycin (VANCOCIN) 1,500 mg in sodium chloride 0.9 % 500 mL IVPB  Status:  Discontinued     1,500 mg 250 mL/hr over 120 Minutes Intravenous Every 12 hours 08/03/16 1737 08/05/16 1802   08/02/16 0500  vancomycin (VANCOCIN) IVPB 1000 mg/200 mL premix  Status:  Discontinued     1,000 mg 100 mL/hr over 120 Minutes Intravenous Every 12 hours 08/01/16 1609 08/03/16 1737   08/01/16 1545  vancomycin (VANCOCIN) IVPB 1000 mg/200 mL premix     1,000 mg 200 mL/hr over 60 Minutes Intravenous  Once 08/01/16 1540 08/01/16 1810   07/30/16 1500  linezolid (ZYVOX) IVPB 600 mg  Status:  Discontinued     600 mg 300 mL/hr over 60 Minutes Intravenous Every 12 hours 07/30/16 1410 08/01/16 1531   07/29/16 2200  vancomycin (VANCOCIN) IVPB 1000 mg/200 mL premix  Status:  Discontinued     1,000 mg 200 mL/hr over 60 Minutes Intravenous Every 12 hours 07/29/16 1016 07/30/16 1411   07/27/16 2100  vancomycin (VANCOCIN) 1,250 mg in sodium chloride 0.9 % 250 mL IVPB  Status:  Discontinued     1,250 mg 166.7 mL/hr over 90 Minutes Intravenous Every 12 hours 07/27/16 1235 07/29/16 1016   07/26/16 2030  vancomycin (VANCOCIN) IVPB 750 mg/150 ml premix  Status:  Discontinued     750 mg 150 mL/hr over 60 Minutes Intravenous Every 8 hours 07/25/16 2011 07/25/16 2040   07/25/16 2040  vancomycin (VANCOCIN) IVPB 750 mg/150 ml premix  Status:  Discontinued     750 mg 150 mL/hr over 60 Minutes Intravenous  Every 8 hours 07/25/16 2040 07/27/16 1235   07/18/16 1200  vancomycin (VANCOCIN) IVPB 750 mg/150 ml premix  Status:  Discontinued     750 mg 150 mL/hr over 60 Minutes Intravenous Every 8 hours 07/18/16 1002 07/25/16 2011   07/08/16 1515  vancomycin (VANCOCIN) IVPB 1000 mg/200 mL premix  Status:  Discontinued     1,000 mg 200 mL/hr over 60 Minutes Intravenous Every 8 hours 07/08/16 1501 07/18/16 0556   07/07/16 2200  piperacillin-tazobactam (ZOSYN) IVPB 3.375 g  Status:  Discontinued     3.375 g 12.5 mL/hr over 240 Minutes Intravenous Every 8 hours 07/07/16 1605 07/07/16 1754   07/07/16 1615  vancomycin (VANCOCIN) IVPB 750 mg/150 ml premix  Status:  Discontinued     750 mg 150 mL/hr over 60 Minutes Intravenous Every 8 hours 07/07/16 1605 07/08/16 1501   07/07/16 0813  piperacillin-tazobactam (ZOSYN) IVPB 3.375 g     3.375 g 100 mL/hr over 30 Minutes Intravenous  Once 07/07/16 0644 07/07/16 0839   07/07/16 0215  piperacillin-tazobactam (ZOSYN) IVPB 3.375 g     3.375 g 100 mL/hr over 30 Minutes Intravenous  Once 07/07/16 0213 07/07/16 0313   07/07/16 0215  vancomycin (VANCOCIN) IVPB 1000 mg/200 mL premix     1,000 mg 200 mL/hr over  60 Minutes Intravenous  Once 07/07/16 0213 07/07/16 0343      Medications: Scheduled Meds: . cloNIDine  0.1 mg Oral BID  . enoxaparin (LOVENOX) injection  40 mg Subcutaneous Q24H  . famotidine  20 mg Oral Daily  . feeding supplement (ENSURE ENLIVE)  237 mL Oral TID BM  . feeding supplement (PRO-STAT SUGAR FREE 64)  30 mL Oral BID  . ferrous sulfate  325 mg Oral BID WC  . folic acid  1 mg Oral Daily  . hydrocortisone cream   Topical BID  . ibuprofen  400 mg Oral TID  . iopamidol  20 mL Intra-articular Once  . lidocaine  1 patch Transdermal Q24H  . methocarbamol  750 mg Oral TID  . multivitamin with minerals  1 tablet Oral Daily  . nicotine  21 mg Transdermal Daily  . sodium chloride  10 mL Other Once  . sodium chloride flush  10-40 mL Intracatheter  Q12H  . thiamine  100 mg Oral Daily  . triamcinolone ointment   Topical BID  . vancomycin  1,000 mg Intravenous Q12H   Continuous Infusions: . lactated ringers     PRN Meds:.acetaminophen **OR** acetaminophen, albuterol, hydrALAZINE, ondansetron **OR** ondansetron (ZOFRAN) IV, oxyCODONE, polyethylene glycol, sodium chloride flush, sodium chloride flush, zolpidem    Objective: Weight change: 4.8 oz (0.136 kg)  Intake/Output Summary (Last 24 hours) at 08/07/16 1343 Last data filed at 08/07/16 1610  Gross per 24 hour  Intake             2414 ml  Output             1200 ml  Net             1214 ml   Blood pressure (!) 126/99, pulse (!) 105, temperature 98 F (36.7 C), temperature source Oral, resp. rate 18, height 5\' 2"  (1.575 m), weight 117 lb 6.4 oz (53.3 kg), last menstrual period 05/16/2016, SpO2 100 %. Temp:  [97.9 F (36.6 C)-98.7 F (37.1 C)] 98 F (36.7 C) (02/05 0859) Pulse Rate:  [74-105] 105 (02/05 0859) Resp:  [17-18] 18 (02/05 0859) BP: (101-126)/(60-99) 126/99 (02/05 0859) SpO2:  [100 %] 100 % (02/05 0859) Weight:  [117 lb 6.4 oz (53.3 kg)] 117 lb 6.4 oz (53.3 kg) (02/04 2139)  Physical Exam: General: Alert and awake, oriented x3,  concerns for her shoulder being still infected and that she would likely need surgery. HEENT: anicteric sclera,  EOMI, oropharynx clear and without exudate Cardiovascular: tachy rate, normal r,  Murmur LUSB Pulmonary: Agrees breath sounds at the bases Gastrointestinal: soft nontender, nondistended, normal bowel sounds, Musculoskeletal : Inability to abduct elevate shoulder tenderness on palpation of right shoulder, she is pain in her lower back but no focal tenderness there Skin, soft tissue: no rashes Neuro: nonfocal, strength and sensation intact  CBC:  CBC Latest Ref Rng & Units 08/05/2016 07/31/2016 07/30/2016  WBC 4.0 - 10.5 K/uL 9.6 9.7 12.8(H)  Hemoglobin 12.0 - 15.0 g/dL 9.6(E) 8.9(L) 9.8(L)  Hematocrit 36.0 - 46.0 % 30.9(L)  29.2(L) 32.3(L)  Platelets 150 - 400 K/uL 294 324 376      BMET  Recent Labs  08/05/16 1643  CREATININE 0.53     Liver Panel  No results for input(s): PROT, ALBUMIN, AST, ALT, ALKPHOS, BILITOT, BILIDIR, IBILI in the last 72 hours.     Sedimentation Rate No results for input(s): ESRSEDRATE in the last 72 hours. C-Reactive Protein No results for input(s): CRP in the last  72 hours.  Micro Results: Recent Results (from the past 720 hour(s))  Culture, blood (routine x 2)     Status: Abnormal   Collection Time: 07/09/16  6:24 PM  Result Value Ref Range Status   Specimen Description BLOOD LEFT FOREARM  Final   Special Requests IN PEDIATRIC BOTTLE 3CC  Final   Culture  Setup Time   Final    GRAM POSITIVE COCCI IN CLUSTERS IN PEDIATRIC BOTTLE CRITICAL RESULT CALLED TO, READ BACK BY AND VERIFIED WITH: ALaural Benes [facility] [id number] BEAMJ    Culture (A)  Final    STAPHYLOCOCCUS AUREUS SUSCEPTIBILITIES PERFORMED ON PREVIOUS CULTURE WITHIN THE LAST 5 DAYS.    Report Status 07/11/2016 FINAL  Final  Culture, blood (routine x 2)     Status: None   Collection Time: 07/09/16  6:25 PM  Result Value Ref Range Status   Specimen Description BLOOD LEFT HAND  Final   Special Requests BOTTLES DRAWN AEROBIC ONLY 6CC  Final   Culture NO GROWTH 5 DAYS  Final   Report Status 07/15/2016 FINAL  Final  Culture, blood (routine x 2)     Status: None   Collection Time: 07/11/16 10:10 AM  Result Value Ref Range Status   Specimen Description BLOOD LEFT ANTECUBITAL  Final   Special Requests BOTTLES DRAWN AEROBIC ONLY 5CC  Final   Culture NO GROWTH 5 DAYS  Final   Report Status 07/16/2016 FINAL  Final  Culture, blood (routine x 2)     Status: None   Collection Time: 07/11/16 10:15 AM  Result Value Ref Range Status   Specimen Description BLOOD LEFT ARM  Final   Special Requests BOTTLES DRAWN AEROBIC ONLY 5CC  Final   Culture NO GROWTH 5 DAYS  Final   Report Status 07/16/2016 FINAL  Final    Culture, body fluid-bottle     Status: None   Collection Time: 07/20/16 11:27 AM  Result Value Ref Range Status   Specimen Description PERITONEAL  Final   Special Requests NONE  Final   Culture NO GROWTH 5 DAYS  Final   Report Status 07/25/2016 FINAL  Final  Gram stain     Status: None   Collection Time: 07/20/16 11:27 AM  Result Value Ref Range Status   Specimen Description PERITONEAL  Final   Special Requests NONE  Final   Gram Stain   Final    FEW WBC PRESENT,BOTH PMN AND MONONUCLEAR NO ORGANISMS SEEN    Report Status 07/20/2016 FINAL  Final  Culture, blood (Routine X 2) w Reflex to ID Panel     Status: None   Collection Time: 07/29/16  2:00 PM  Result Value Ref Range Status   Specimen Description BLOOD LEFT ANTECUBITAL  Final   Special Requests BOTTLES DRAWN AEROBIC ONLY 5CC  Final   Culture NO GROWTH 5 DAYS  Final   Report Status 08/03/2016 FINAL  Final  Culture, blood (Routine X 2) w Reflex to ID Panel     Status: None   Collection Time: 07/29/16  2:05 PM  Result Value Ref Range Status   Specimen Description BLOOD LEFT HAND  Final   Special Requests IN PEDIATRIC BOTTLE [facility]  Final   Culture NO GROWTH 5 DAYS  Final   Report Status 08/03/2016 FINAL  Final    Studies/Results: Dg Chest 1 View  Result Date: 08/07/2016 CLINICAL DATA:  Endocarditis. Septic emboli. Continued surveillance, fever. EXAM: CHEST 1 VIEW COMPARISON:  08/01/2016 is the most recent chest radiograph.  FINDINGS: The heart size and mediastinal contours are within normal limits. Continued opacity in both lung fields, both focal and confluent, consistent with the previously identified airspace disease and septic emboli. Improved aeration at the LEFT base. BILATERAL effusions may be slightly improved as well. No osseous findings. PICC line from RIGHT arm approach lies with its tip at the upper RIGHT atrium. IMPRESSION: Slight improvement in aeration and effusions. No apparent progression of disease. Electronically  Signed   By: Elsie StainJohn T Curnes M.D.   On: 08/07/2016 11:00      Assessment/Plan:  INTERVAL HISTORY: Patient's MRI over the weekend showed improvement she became afebrile but now she has complaints of lower back pain and malaise   Principal Problem:   MRSA bacteremia Active Problems:   Sepsis (HCC)   Cavitary pneumonia   IVDU (intravenous drug user)   Cigarette smoker   ETOH abuse   Hepatitis C antibody test positive   Normocytic anemia   Thrombocytopenia (HCC)   Endocarditis of tricuspid valve   Acute septic pulmonary embolism without acute cor pulmonale (HCC)   Sepsis due to methicillin resistant Staphylococcus aureus (MRSA) (HCC)   Pressure injury of skin   Acute pain of right shoulder   Septic embolism (HCC)   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 female with  IVDU hx, MRSA bacteremia TV endocarditis with septic emboli to the lungs, MRI showing infection in the shoulder that has improved now also with malaise and lower back pain  --- Continue IV vancomycin --I have ordered an MRI of the lumbar spine with and without contrast to make sure we're not missing an epidural abscess or discitis that might need another intervention  --I also ordered a new 2 view film of the chest to see if we have new embolization to the lungs.  If we find evidence of infection of the lumbar spine we may need an intervention and we also might need revision on duration of therapy.     LOS: 31 days   Acey LavCornelius Van Dam 08/07/2016, 1:43 PM

## 2016-08-07 NOTE — Progress Notes (Signed)
Pharmacy Antibiotic Note Anna CaterConstance Lawyer is a 36 y.o. 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 Pt maintained on vancomycin from 1/5 until 1/28 when pt developed pelvic rash and was transitioned to Linezolid. Vancomycin restarted on 08/01/16 at lower rate.  Patient discharging to SNF today.  Plan:  Vancomycin 1000mg  IV q12h through 08/21/2016 Recommend a BMET and vancomycin trough on 2/6 at outpatient facility if able Discharging 2/5 - OPAT consult completed, please sign upon discharge   Height: 5\' 2"  (157.5 cm) Weight: 117 lb 6.4 oz (53.3 kg) IBW/kg (Calculated) : 50.1  Temp (24hrs), Avg:98.2 F (36.8 C), Min:97.9 F (36.6 C), Max:98.7 F (37.1 C)   Recent Labs Lab 08/03/16 1638 08/04/16 0440 08/05/16 0426 08/05/16 1643  WBC  --   --  9.6  --   CREATININE 0.59 0.64  --  0.53  VANCOTROUGH 12*  --   --  26*    Estimated Creatinine Clearance: 76.2 mL/min (by C-G formula based on SCr of 0.53 mg/dL).    No Known Allergies  Antimicrobials this admission:  Vanc 1/5 >> 1/28 (developed puritic rash on torso 1/29); 1/30>> (2/19) Linezolid 1/28>> 1/30 Zosyn 1/5 >> 1/5  Dose adjustments this admission:  1/6 VT 15 - on vanc 750 mg IV q 8 hrs 1/8 VT 18 - on vanc 1g IV q 8 hrs 1/12 VT 24 drawn from PICC line 1.5hr early - will leave dose alone for now 1/16 @ 0415 measured VT @30  mcg/ml (only 5 hours after evening dose) so corrects to ~24 mcg/ml >> will reduce to 750 mg/8h 1/18 VT 16 >> continue Vanc 750 mg/8h 1/21 VT 18 >> continue vanc 750mg /8h 1/23 VT 8 (AM dose got held for dental procedure)-not trough 1/25 VT 22 >> change vanc to 1g/q12h - changed to q12h dosing to help at discharge. Estimated ke 0.0758 2/3 VT = 26 on 1500 mg IV q12h -- decrease to 1g q12h   Microbiology results:  1/5 BCx: 2/2 MRSA 1/5 BCID - MRSA 1/5  UCx: neg 1/5 MRSA PCR: (+) 1/6 resp panel: neg 1/6 HCV Ab: 11, viral load not calc- spontaneous clearing per ID 1/6 BCx: 1/2 MRSA 1/7 BCx:1/2 staph aureus 1/9 Bcx: neg 1/18 peritoneal fluid: neg 1/27 BCx: neg   Judy Goodenow D. Reagyn Facemire, PharmD, BCPS Clinical Pharmacist Pager: [phone] 08/07/2016 9:28 AM

## 2016-08-07 NOTE — Progress Notes (Signed)
Nutrition Follow-up  DOCUMENTATION CODES:   Not applicable  INTERVENTION:  Provide Ensure Enlive po BID, each supplement provides 350 kcal and 20 grams of protein.  Discontinue Prostat.  NUTRITION DIAGNOSIS:   Inadequate oral intake related to  (decreased appetite) as evidenced by meal completion < 50%; improved.  GOAL:   Patient will meet greater than or equal to 90% of their needs; met  MONITOR:   PO intake, Supplement acceptance  REASON FOR ASSESSMENT:   Malnutrition Screening Tool    ASSESSMENT:   Pt with hx of IV drug abuse, ETOH, hepatitis C, who was released from jail 1/4 admitted with sepsis, bilateral cavitary PNA, MRSA bacteremia and probable tricuspid valve endocarditis with septic pulmonary emboli.  Multiple tooth extractions 1/23.   Plan for discharge tomorrow. MRI of backscheduled today as pt reports new lower back pain. Pt is currently on a soft diet. Meal completion has been 75-100%. Pt currently has Prostat ordered and has been refusing them. Pt additionally has Ensure ordered and has been consuming most of them. RD to discontinue Prostat and modify Ensure to BID as intake has improved.   Diet Order:  DIET SOFT Room service appropriate? Yes; Fluid consistency: Thin Diet - low sodium heart healthy  Skin:  Wound (see comment) (Stage 2 on sacrum)  Last BM:  2/4  Height:   Ht Readings from Last 1 Encounters:  08/05/16 _0  (1.575 m)    Weight:   Wt Readings from Last 1 Encounters:  08/06/16 117 lb 6.4 oz (53.3 kg)    Ideal Body Weight:  50 kg  BMI:  Body mass index is 21.47 kg/m.  Estimated Nutritional Needs:   Kcal:  1700-1900  Protein:  80-90 grams  Fluid:  1.7 - 1.9 L/day  EDUCATION NEEDS:   No education needs identified at this time  Corrin Parker, MS, RD, LDN Pager # [phone] After hours/ weekend pager # [phone]

## 2016-08-07 NOTE — Progress Notes (Signed)
Discussed with Dr. Daiva EvesVan Dam. He ordered MRI of back.  Will hold discharge until MRI result is back.  Discussed with pt's nurse.

## 2016-08-07 NOTE — Discharge Summary (Signed)
Physician Discharge Summary  Anna Colon YBO:[id number] DOB: 1980/10/11 DOA: 07/07/2016  PCP: No PCP Per Patient  Admit date: 07/07/2016 Discharge date: 08/07/2016  Admitted From:Home Disposition:SNF  Recommendations for Outpatient Follow-up:  1. Follow up with PCP in 1-2 weeks 2. Please obtain CBC, vanco trough on 08/08/16 and follow up with your PCP  Home Health:[facility] Equipment/Devices:None Discharge Condition: Stable CODE STATUS: Full code Diet recommendation: Heart healthy  Brief/Interim Summary:36 year old female with history of IV drug abuse, recently released from jail, admitted with fevers, workup significant for MRSA bacteremia, cavitary pneumonia, tricuspid endocarditis, dental abscess. [facility] ID with recommendation of IV vancomycin for total of 6 weeks, status post extraction of multiple teeth extraction, had PICC line inserted, which needs to be removed after the completion of antibiotics.  # Sepsis: Secondary to MRSA bacteremia, TV endocarditis, septic pulmonary emboli and possibly right shoulder septic joint. -As per ID continue IV vancomycin through February 19. Needed to pull out PICC line at the end of treatment.  - CT chest consistent with improvement in bilateral cavitary lesions compatible with septic emboli. - Continues to use incentive spirometry - Most recent blood cultures from 1/27 negative. -Patient is afebrile now. Continue to monitor fever curve. -Recommended to check labs including CBC and vancomycin trough tomorrow and then as per her PCP.  # Possible right shoulder septic joint: Ortho consult appreciated.  -s/p IR attempt to aspirate the right shoulder joint, it was unsuccessful. Now patient is afebrile. On antibiotics. Needs rehabilitation.  #Pain management: -Pain medications adjusted. Other new Tylenol, Motrin, Robaxin. Also on oxycodone as needed for breakthrough pain management. Education provided to the patient regarding narcotics.  # Dental  abscess - had her teeth pulled giving dental abscess.  # Pleural effusion - Status post ultrasound-guided thoracentesis 1/18 - Repeat CT chest 1/29 with slight improvement of septic emboli and effusion.  # History of substance abuse - She is counseled  # Tobacco abuse - Counseled on cessation. nicotine patch.  #ETOH abuse - No signs of withdrawal. on low dose clonidine  # Iron deficiency anemia - continue oral iron. 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 Outpatient follow up.  # Sacral pressure ulcer - Wound care consulted in the hospital. - patient encouraged to ambulate, and use incentive spirometry  Patient is clinically stable. Discussed with the social worker, care management team today regarding discharge with IV antibiotics. Patient denied any new symptoms today. She was recommended to follow up with PCP, infectious disease outpatient.  Discharge Diagnoses:  Principal Problem:   MRSA bacteremia Active Problems:   Sepsis ([place])   Cavitary pneumonia   IVDU (intravenous drug user)   Cigarette smoker   ETOH abuse   Hepatitis C antibody test positive   Normocytic anemia   Thrombocytopenia (HCC)   Endocarditis of tricuspid valve   Acute septic pulmonary embolism without acute cor pulmonale (HCC)   Sepsis due to methicillin resistant Staphylococcus aureus (MRSA) (HCC)   Pressure injury of skin   Acute pain of right shoulder   Septic embolism (HCC)   Pyogenic arthritis of right shoulder region (HCC)   Pain   FUO (fever of unknown origin)   Polysubstance abuse   Bursitis of right shoulder   Pain management    Discharge Instructions  Discharge Instructions    Call MD for:  difficulty breathing, headache or visual disturbances    Complete by:  As directed    Call MD for:  hives    Complete  by:  As directed    Call MD for:  persistant dizziness or light-headedness    Complete by:  As directed    Call MD for:  persistant nausea and  vomiting    Complete by:  As directed    Call MD for:  severe uncontrolled pain    Complete by:  As directed    Call MD for:  temperature >100.4    Complete by:  As directed    Diet - low sodium heart healthy    Complete by:  As directed    Discharge instructions    Complete by:  As directed    Please follow up with PCP, ID.   Home infusion instructions [facility] May follow [place] Dosing Protocol; May administer Cathflo as needed to maintain patency of vascular access device.; Flushing of vascular access device: per [facility] Protocol: 0.9% NaCl pre/post medica...    Complete by:  As directed    Instructions:  May follow [facility] Dosing Protocol   Instructions:  May administer Cathflo as needed to maintain patency of vascular access device.   Instructions:  Flushing of vascular access device: per [facility] Protocol: 0.9% NaCl pre/post medication administration and prn patency; Heparin 100 u/ml, 37m for implanted ports and Heparin 10u/ml, 569mfor all other central venous catheters.   Instructions:  May follow [facility] Anaphylaxis Protocol for First Dose Administration in the home: 0.9% NaCl at 25-50 ml/hr to maintain IV access for protocol meds. Epinephrine 0.3 ml IV/IM PRN and Benadryl 25-50 IV/IM PRN s/s of anaphylaxis.   Instructions:  [facility] Coordinator (RN) to assist per patient IV care needs in the home PRN.   Increase activity slowly    Complete by:  As directed      Allergies as of 08/07/2016   No Known Allergies     Medication List    TAKE these medications   acetaminophen 325 MG tablet Commonly known as:  TYLENOL Take 2 tablets (650 mg total) by mouth every 6 (six) hours as needed for mild pain (or Fever >/= 101). What changed:  medication strength  how much to take  reasons to take this   cloNIDine 0.1 MG tablet Commonly known as:  CATAPRES Take 1 tablet (0.1 mg total) by mouth 2 (two) times daily.   famotidine 20 MG tablet Commonly known  as:  PEPCID Take 1 tablet (20 mg total) by mouth daily. Start taking on:  08/08/2016   ferrous sulfate 325 (65 FE) MG tablet Take 1 tablet (325 mg total) by mouth 2 (two) times daily with a meal.   folic acid 1 MG tablet Commonly known as:  FOLVITE Take 1 tablet (1 mg total) by mouth daily. Start taking on:  08/08/2016   ibuprofen 200 MG tablet Commonly known as:  ADVIL,MOTRIN Take 600 mg by mouth every 6 (six) hours as needed for headache or moderate pain.   methocarbamol 750 MG tablet Commonly known as:  ROBAXIN Take 1 tablet (750 mg total) by mouth 3 (three) times daily.   multivitamin with minerals Tabs tablet Take 1 tablet by mouth daily. Start taking on:  08/08/2016   nicotine 21 mg/24hr patch Commonly known as:  NICODERM CQ - dosed in mg/24 hours Place 1 patch (21 mg total) onto the skin daily.   Oxycodone HCl 10 MG Tabs Take 1 tablet (10 mg total) by mouth every 6 (six) hours as needed.   polyethylene glycol packet Commonly known as:  MIRALAX /  GLYCOLAX Take 17 g by mouth daily as needed for mild constipation.   vancomycin IVPB Inject 1,000 mg into the vein every 12 (twelve) hours. GIVE EACH DOSE OVER 2 HOURS  Indication: endocarditis Last Day of Therapy:  08/21/16 Labs - _0 /05/18 1051     Follow-up Information    COMER, ROBERT, MD. Schedule an appointment as soon as possible for a visit in 2 week(s).   Specialty:  Infectious Diseases Contact information: [address] [place] [address] [phone]        Tharon Aquas Trigt III, MD. Schedule an appointment as soon as possible for a visit in 1 month(s).   Specialty:  Cardiothoracic Surgery Contact information: [address] [place] [address] [phone]          No Known Allergies  Consultations:  Infectious disease  CT surgery  Orthopedics  Procedures/Studies: Teeth extraction 1/23 by Dr. Enrique Sack  Subjective: Patient was seen and examined at bedside. Patient was sitting on chair. Denied headache, dizziness, chest pain, shortness of breath, nausea or vomiting. Pain is controlled with the current regimen. No constipation or diarrhea.  Discharge Exam: Vitals:   08/07/16 0603 08/07/16 0859  BP: 121/67 (!) 126/99  Pulse: 74 (!) 105  Resp: 18 18  Temp: 98.7 F (37.1 C) 98 F (36.7 C)   Vitals:   08/06/16 1758 08/06/16 2139 08/07/16 0603 08/07/16 0859  BP: 101/69 101/60 121/67 (!) 126/99  Pulse: 84 83 74 (!) 105  Resp: _1 Temp: 98.1 F (36.7 C) 97.9 F (36.6 C) 98.7 F (37.1 C) 98 F (36.7 C)  TempSrc: Oral Oral Oral Oral  SpO2: 100% 100% 100% 100%  Weight:  53.3 kg (117 lb  6.4 oz)    Height:        General: Pt is alert, awake, not in acute distress Cardiovascular: RRR, S1/S2 +, no rubs, no gallops Respiratory: CTA bilaterally, no wheezing, no rhonchi Abdominal: Soft, NT, ND, bowel sounds + Extremities: no edema, no cyanosis Neurology: Alert, awake, oriented, nonfocal neurological exam.   The results of significant diagnostics from this hospitalization (including imaging, microbiology,  ancillary and laboratory) are listed below for reference.     Microbiology: Recent Results (from the past 240 hour(s))  Culture, blood (Routine X 2) w Reflex to ID Panel     Status: None   Collection Time: 07/29/16  2:00 PM  Result Value Ref Range Status   Specimen Description BLOOD LEFT ANTECUBITAL  Final   Special Requests BOTTLES DRAWN AEROBIC ONLY 5CC  Final   Culture NO GROWTH 5 DAYS  Final   Report Status 08/03/2016 FINAL  Final  Culture, blood (Routine X 2) w Reflex to ID Panel     Status: None   Collection Time: 07/29/16  2:05 PM  Result Value Ref Range Status   Specimen Description BLOOD LEFT HAND  Final   Special Requests IN PEDIATRIC BOTTLE 2CC  Final   Culture NO GROWTH 5 DAYS  Final   Report Status 08/03/2016 FINAL  Final     Labs: BNP (last 3 results) No results for input(s): BNP in the last 8760 hours. Basic Metabolic Panel:  Recent Labs Lab 08/03/16 1638 08/04/16 0440 08/05/16 1643  NA 135  --   --   K 4.6  --   --   CL 99*  --   --   CO2 27  --   --   GLUCOSE 103*  --   --   BUN 7  --   --   CREATININE 0.59 0.64 0.53  CALCIUM 9.4  --   --    Liver Function Tests: No results for input(s): AST, ALT, ALKPHOS, BILITOT, PROT, ALBUMIN in the last 168 hours. No results for input(s): LIPASE, AMYLASE in the last 168 hours. No results for input(s): AMMONIA in the last 168 hours. CBC:  Recent Labs Lab 08/05/16 0426  WBC 9.6  HGB 9.7*  HCT 30.9*  MCV 85.8  PLT 294   Cardiac Enzymes: No results for input(s): CKTOTAL, CKMB, CKMBINDEX,  TROPONINI in the last 168 hours. BNP: Invalid input(s): POCBNP CBG: No results for input(s): GLUCAP in the last 168 hours. D-Dimer No results for input(s): DDIMER in the last 72 hours. Hgb A1c No results for input(s): HGBA1C in the last 72 hours. Lipid Profile No results for input(s): CHOL, HDL, LDLCALC, TRIG, CHOLHDL, LDLDIRECT in the last 72 hours. Thyroid function studies No results for input(s): TSH, T4TOTAL, T3FREE, THYROIDAB in the last 72 hours.  Invalid input(s): FREET3 Anemia work up No results for input(s): VITAMINB12, FOLATE, FERRITIN, TIBC, IRON, RETICCTPCT in the last 72 hours. Urinalysis    Component Value Date/Time   COLORURINE AMBER (A) 07/07/2016 0110   APPEARANCEUR CLOUDY (A) 07/07/2016 0110   LABSPEC 1.019 07/07/2016 0110   PHURINE 6.0 07/07/2016 0110   GLUCOSEU NEGATIVE 07/07/2016 0110   HGBUR MODERATE (A) 07/07/2016 0110   BILIRUBINUR SMALL (A) 07/07/2016 0110   KETONESUR NEGATIVE 07/07/2016 0110   PROTEINUR 100 (A) 07/07/2016 0110   NITRITE NEGATIVE 07/07/2016 0110   LEUKOCYTESUR SMALL (A) 07/07/2016 0110   Sepsis Labs Invalid input(s): PROCALCITONIN,  WBC,  LACTICIDVEN Microbiology Recent Results (from the past 240 hour(s))  Culture, blood (Routine X 2) w Reflex to ID Panel     Status: None   Collection Time: 07/29/16  2:00 PM  Result Value Ref Range Status   Specimen Description BLOOD LEFT ANTECUBITAL  Final   Special Requests BOTTLES DRAWN AEROBIC ONLY 5CC  Final   Culture NO GROWTH 5 DAYS  Final   Report Status 08/03/2016 FINAL  Final  Culture, blood (Routine X  2) w Reflex to ID Panel     Status: None   Collection Time: 07/29/16  2:05 PM  Result Value Ref Range Status   Specimen Description BLOOD LEFT HAND  Final   Special Requests IN PEDIATRIC BOTTLE [facility]  Final   Culture NO GROWTH 5 DAYS  Final   Report Status 08/03/2016 FINAL  Final     Time coordinating discharge: 31 minutes  SIGNED:   Rosita Fire, MD  [facility] 08/07/2016, 10:51 AM  If 7PM-7AM, please contact night-coverage [email or web address] Password [email or web address]

## 2016-08-07 NOTE — Progress Notes (Deleted)
Patient was D/C'd home via [employer] with [facility] from [facility].

## 2016-08-07 NOTE — Clinical Social Work Note (Signed)
Patient was to discharge to [facility] today, however is scheduled for an MRI today. Per MD, patient will discharge tomorrow as time patient will have MRI is unknown. Deborah with [facility] contacted and updated.   Genelle BalVanessa Aleiyah Halpin, MSW, LCSW Licensed Clinical Social Worker Clinical Social Work Department [facility] [phone]

## 2016-08-08 ENCOUNTER — Inpatient Hospital Stay (HOSPITAL_COMMUNITY): Payer: Self-pay

## 2016-08-08 LAB — BASIC METABOLIC PANEL
Anion gap: 10 (ref 5–15)
BUN: 14 mg/dL (ref 6–20)
CHLORIDE: 100 mmol/L — AB (ref 101–111)
CO2: 27 mmol/L (ref 22–32)
Calcium: 9.8 mg/dL (ref 8.9–10.3)
Creatinine, Ser: 0.62 mg/dL (ref 0.44–1.00)
GFR calc non Af Amer: >60 mL/min (ref >60)
Glucose, Bld: 98 mg/dL (ref 65–99)
POTASSIUM: 4.6 mmol/L (ref 3.5–5.1)
SODIUM: 137 mmol/L (ref 135–145)

## 2016-08-08 LAB — VANCOMYCIN, TROUGH: Vancomycin Tr: 17 ug/mL (ref 15–20)

## 2016-08-08 MED ORDER — HEPARIN SOD (PORK) LOCK FLUSH 100 UNIT/ML IV SOLN
250.0000 [IU] | INTRAVENOUS | Status: AC | PRN
  Administered 2016-08-08: 250 [IU]

## 2016-08-08 MED ORDER — VANCOMYCIN IV (FOR PTA / DISCHARGE USE ONLY): 1000.0000 mg | Freq: Two times a day (BID) | INTRAVENOUS | 0 refills | Status: AC

## 2016-08-08 MED ORDER — GADOBENATE DIMEGLUMINE 529 MG/ML IV SOLN
10.0000 mL | Freq: Once | INTRAVENOUS | Status: AC
  Administered 2016-08-08: 10 mL via INTRAVENOUS

## 2016-08-08 MED ORDER — HYDROMORPHONE HCL 1 MG/ML IJ SOLN
1.0000 mg | Freq: Once | INTRAMUSCULAR | Status: AC
  Administered 2016-08-08: 1 mg via INTRAVENOUS
  Filled 2016-08-08: qty 1

## 2016-08-08 NOTE — Progress Notes (Signed)
Patient was seen and examined at bedside. No new event. MRI spine showed no acute findings. Patient is clinically stable to transfer her care to outpatient. I discussed with the Child psychotherapistsocial worker and nursing staff. Patient will be discharged with IV antibiotics to complete the course. -Discharge order and discharge summary done yesterday. Please review the note from yesterday for details.

## 2016-08-08 NOTE — Progress Notes (Signed)
Subjective:   No new complaints   Antibiotics:  Anti-infectives    Start     Dose/Rate Route Frequency Ordered Stop   08/08/16 0000  vancomycin IVPB     1,000 mg Intravenous Every 12 hours 08/08/16 1211 08/22/16 2359   08/07/16 0000  vancomycin IVPB  Status:  Discontinued     1,000 mg Intravenous Every 12 hours 08/07/16 1051 08/08/16    08/06/16 0000  vancomycin (VANCOCIN) IVPB 1000 mg/200 mL premix  Status:  Discontinued     1,000 mg 200 mL/hr over 60 Minutes Intravenous Every 12 hours 08/05/16 1802 08/08/16 1813   08/04/16 0500  vancomycin (VANCOCIN) 1,500 mg in sodium chloride 0.9 % 500 mL IVPB  Status:  Discontinued     1,500 mg 250 mL/hr over 120 Minutes Intravenous Every 12 hours 08/03/16 1737 08/05/16 1802   08/02/16 0500  vancomycin (VANCOCIN) IVPB 1000 mg/200 mL premix  Status:  Discontinued     1,000 mg 100 mL/hr over 120 Minutes Intravenous Every 12 hours 08/01/16 1609 08/03/16 1737   08/01/16 1545  vancomycin (VANCOCIN) IVPB 1000 mg/200 mL premix     1,000 mg 200 mL/hr over 60 Minutes Intravenous  Once 08/01/16 1540 08/01/16 1810   07/30/16 1500  linezolid (ZYVOX) IVPB 600 mg  Status:  Discontinued     600 mg 300 mL/hr over 60 Minutes Intravenous Every 12 hours 07/30/16 1410 08/01/16 1531   07/29/16 2200  vancomycin (VANCOCIN) IVPB 1000 mg/200 mL premix  Status:  Discontinued     1,000 mg 200 mL/hr over 60 Minutes Intravenous Every 12 hours 07/29/16 1016 07/30/16 1411   07/27/16 2100  vancomycin (VANCOCIN) 1,250 mg in sodium chloride 0.9 % 250 mL IVPB  Status:  Discontinued     1,250 mg 166.7 mL/hr over 90 Minutes Intravenous Every 12 hours 07/27/16 1235 07/29/16 1016   07/26/16 2030  vancomycin (VANCOCIN) IVPB 750 mg/150 ml premix  Status:  Discontinued     750 mg 150 mL/hr over 60 Minutes Intravenous Every 8 hours 07/25/16 2011 07/25/16 2040   07/25/16 2040  vancomycin (VANCOCIN) IVPB 750 mg/150 ml premix  Status:  Discontinued     750 mg 150 mL/hr  over 60 Minutes Intravenous Every 8 hours 07/25/16 2040 07/27/16 1235   07/18/16 1200  vancomycin (VANCOCIN) IVPB 750 mg/150 ml premix  Status:  Discontinued     750 mg 150 mL/hr over 60 Minutes Intravenous Every 8 hours 07/18/16 1002 07/25/16 2011   07/08/16 1515  vancomycin (VANCOCIN) IVPB 1000 mg/200 mL premix  Status:  Discontinued     1,000 mg 200 mL/hr over 60 Minutes Intravenous Every 8 hours 07/08/16 1501 07/18/16 0556   07/07/16 2200  piperacillin-tazobactam (ZOSYN) IVPB 3.375 g  Status:  Discontinued     3.375 g 12.5 mL/hr over 240 Minutes Intravenous Every 8 hours 07/07/16 1605 07/07/16 1754   07/07/16 1615  vancomycin (VANCOCIN) IVPB 750 mg/150 ml premix  Status:  Discontinued     750 mg 150 mL/hr over 60 Minutes Intravenous Every 8 hours 07/07/16 1605 07/08/16 1501   07/07/16 0813  piperacillin-tazobactam (ZOSYN) IVPB 3.375 g     3.375 g 100 mL/hr over 30 Minutes Intravenous  Once 07/07/16 0644 07/07/16 0839   07/07/16 0215  piperacillin-tazobactam (ZOSYN) IVPB 3.375 g     3.375 g 100 mL/hr over 30 Minutes Intravenous  Once 07/07/16 0213 07/07/16 0313   07/07/16 0215  vancomycin (VANCOCIN) IVPB 1000 mg/200 mL  premix     1,000 mg 200 mL/hr over 60 Minutes Intravenous  Once 07/07/16 0213 07/07/16 0343      Medications: Scheduled Meds:  Continuous Infusions:  PRN Meds:.    Objective: Weight change: -7 lb 6.3 oz (-3.352 kg)  Intake/Output Summary (Last 24 hours) at 08/08/16 2156 Last data filed at 08/08/16 1500  Gross per 24 hour  Intake             1360 ml  Output              800 ml  Net              560 ml   Blood pressure (!) 109/59, pulse 75, temperature 97.7 F (36.5 C), temperature source Oral, resp. rate 16, height 5\' 2"  (1.575 m), weight 110 lb 0.2 oz (49.9 kg), last menstrual period 05/16/2016, SpO2 100 %. Temp:  [97.7 F (36.5 C)-98.3 F (36.8 C)] 97.7 F (36.5 C) (02/06 0459) Pulse Rate:  [75-99] 75 (02/06 0459) Resp:  [16] 16 (02/06 0459) BP:  (92-109)/(50-59) 109/59 (02/06 0459) SpO2:  [99 %-100 %] 100 % (02/06 0459) Weight:  [110 lb 0.2 oz (49.9 kg)] 110 lb 0.2 oz (49.9 kg) (02/05 2200)  Physical Exam: General: Alert and awake, oriented x3,  concerns for her shoulder being still infected and that she would likely need surgery. HEENT: anicteric sclera,  EOMI, oropharynx clear and without exudate Cardiovascular: tachy rate, normal r,  Murmur LUSB Pulmonary: Agrees breath sounds at the bases Gastrointestinal: soft nontender, nondistended, normal bowel sounds, Musculoskeletal : Inability to abduct elevate shoulder tenderness on palpation of right shoulder, she is pain in her lower back but no focal tenderness there Skin, soft tissue: no rashes Neuro: nonfocal, strength and sensation intact  CBC:  CBC Latest Ref Rng & Units 08/05/2016 07/31/2016 07/30/2016  WBC 4.0 - 10.5 K/uL 9.6 9.7 12.8(H)  Hemoglobin 12.0 - 15.0 g/dL 9.5(A9.7(L) 8.9(L) 9.8(L)  Hematocrit 36.0 - 46.0 % 30.9(L) 29.2(L) 32.3(L)  Platelets 150 - 400 K/uL 294 324 376      BMET  Recent Labs  08/08/16 1111  NA 137  K 4.6  CL 100*  CO2 27  GLUCOSE 98  BUN 14  CREATININE 0.62  CALCIUM 9.8     Liver Panel  No results for input(s): PROT, ALBUMIN, AST, ALT, ALKPHOS, BILITOT, BILIDIR, IBILI in the last 72 hours.     Sedimentation Rate No results for input(s): ESRSEDRATE in the last 72 hours. C-Reactive Protein No results for input(s): CRP in the last 72 hours.  Micro Results: Recent Results (from the past 720 hour(s))  Culture, blood (routine x 2)     Status: None   Collection Time: 07/11/16 10:10 AM  Result Value Ref Range Status   Specimen Description BLOOD LEFT ANTECUBITAL  Final   Special Requests BOTTLES DRAWN AEROBIC ONLY 5CC  Final   Culture NO GROWTH 5 DAYS  Final   Report Status 07/16/2016 FINAL  Final  Culture, blood (routine x 2)     Status: None   Collection Time: 07/11/16 10:15 AM  Result Value Ref Range Status   Specimen  Description BLOOD LEFT ARM  Final   Special Requests BOTTLES DRAWN AEROBIC ONLY 5CC  Final   Culture NO GROWTH 5 DAYS  Final   Report Status 07/16/2016 FINAL  Final  Culture, body fluid-bottle     Status: None   Collection Time: 07/20/16 11:27 AM  Result Value Ref Range Status  Specimen Description PERITONEAL  Final   Special Requests NONE  Final   Culture NO GROWTH 5 DAYS  Final   Report Status 07/25/2016 FINAL  Final  Gram stain     Status: None   Collection Time: 07/20/16 11:27 AM  Result Value Ref Range Status   Specimen Description PERITONEAL  Final   Special Requests NONE  Final   Gram Stain   Final    FEW WBC PRESENT,BOTH PMN AND MONONUCLEAR NO ORGANISMS SEEN    Report Status 07/20/2016 FINAL  Final  Culture, blood (Routine X 2) w Reflex to ID Panel     Status: None   Collection Time: 07/29/16  2:00 PM  Result Value Ref Range Status   Specimen Description BLOOD LEFT ANTECUBITAL  Final   Special Requests BOTTLES DRAWN AEROBIC ONLY 5CC  Final   Culture NO GROWTH 5 DAYS  Final   Report Status 08/03/2016 FINAL  Final  Culture, blood (Routine X 2) w Reflex to ID Panel     Status: None   Collection Time: 07/29/16  2:05 PM  Result Value Ref Range Status   Specimen Description BLOOD LEFT HAND  Final   Special Requests IN PEDIATRIC BOTTLE [facility]  Final   Culture NO GROWTH 5 DAYS  Final   Report Status 08/03/2016 FINAL  Final    Studies/Results: Dg Chest 1 View  Result Date: 08/07/2016 CLINICAL DATA:  Endocarditis. Septic emboli. Continued surveillance, fever. EXAM: CHEST 1 VIEW COMPARISON:  08/01/2016 is the most recent chest radiograph. FINDINGS: The heart size and mediastinal contours are within normal limits. Continued opacity in both lung fields, both focal and confluent, consistent with the previously identified airspace disease and septic emboli. Improved aeration at the LEFT base. BILATERAL effusions may be slightly improved as well. No osseous findings. PICC line from RIGHT  arm approach lies with its tip at the upper RIGHT atrium. IMPRESSION: Slight improvement in aeration and effusions. No apparent progression of disease. Electronically Signed   By: Elsie Stain M.D.   On: 08/07/2016 11:00   Mr Lumbar Spine W Wo Contrast  Result Date: 08/08/2016 CLINICAL DATA:  Malaise.  Low back pain. EXAM: MRI LUMBAR SPINE WITHOUT AND WITH CONTRAST TECHNIQUE: Multiplanar and multiecho pulse sequences of the lumbar spine were obtained without and with intravenous contrast. CONTRAST:  10mL MULTIHANCE GADOBENATE DIMEGLUMINE 529 MG/ML IV SOLN COMPARISON:  CT abdomen 07/07/2016 FINDINGS: Segmentation: The lowest lumbar type non-rib-bearing vertebra is labeled as L5. Alignment:  No vertebral subluxation is observed. Vertebrae:  No vertebral edema or abnormal vertebral enhancement. Conus medullaris: Extends to the L1 level and appears normal. No nerve root clumping or enhancement to suggest arachnoiditis. Paraspinal and other soft tissues: The common bile duct is potentially mildly dilated at about 6 mm. Disc levels: L1-2: No impingement. Left paracentral on lateral recess disc protrusion. L2-3:  No impingement.  Diffuse disc bulge. L3-4:  No impingement.  Mild disc bulge. L4-5:  Unremarkable. L5-S1: No impingement. Small synovial cyst lateral to the left facet joint but not in a position to impinge on regional nerve roots. IMPRESSION: 1. Mild degenerative disc disease in the lumbar spine, but without impingement. 2. No abnormal lumbar vertebral edema or enhancement. 3. Mildly prominent common bile duct at 6 mm. Electronically Signed   By: Gaylyn Rong M.D.   On: 08/08/2016 11:26      Assessment/Plan:  INTERVAL HISTORY:   MRI spine without infection   1V CXR without progression of pulmonary disease  Principal Problem:   MRSA bacteremia Active Problems:   Sepsis (HCC)   Cavitary pneumonia   IVDU (intravenous drug user)   Cigarette smoker   ETOH abuse   Hepatitis C antibody  test positive   Normocytic anemia   Thrombocytopenia (HCC)   Endocarditis of tricuspid valve   Acute septic pulmonary embolism without acute cor pulmonale (HCC)   Sepsis due to methicillin resistant Staphylococcus aureus (MRSA) (HCC)   Pressure injury of skin   Acute pain of right shoulder   Septic embolism (HCC)   Pyogenic arthritis of right shoulder region (HCC)   Pain   FUO (fever of unknown origin)   Polysubstance abuse   Bursitis of right shoulder   Pain management   Back pain    Anna Colon is a 36 y.o. female with  IVDU hx, MRSA bacteremia TV endocarditis with septic emboli to the lungs, MRI showing infection in the shoulder that has improved now also with malaise and lower back pain  --- Continue IV vancomycin ----duration as per prior notes.    LOS: 32 days   Acey Lav 08/08/2016, 9:56 PM

## 2016-08-08 NOTE — Care Management Note (Signed)
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             Action/Plan: Plan is to d/c to SNF today. Pt requiring long tern IV ABX, with hx of IVDA. CSW managing disposition to facility.  Expected Discharge Date:  08/07/16               Expected Discharge Plan:  Skilled Nursing Facility/ [facility]  In-House Referral:  Clinical Social Work  Discharge planning Services  CM Consult, Medication Assistance, Indigent [facility]  Status of Service:  Completed, signed off  If discussed at [employer] Length of Stay Meetings, dates discussed:    Additional Comments:  Epifanio LeschesCole, Vung Kush Hudson, RN 08/08/2016, 12:04 PM

## 2016-08-08 NOTE — Clinical Social Work Note (Signed)
Patient discharged today to [facility] via [facility]. Patient's father aware of discharge and per patient will pick her up at discharge from facility.  Genelle BalVanessa Arwen Haseley, MSW, LCSW Licensed Clinical Social Worker Clinical Social Work Department [facility] [phone]

## 2016-08-08 NOTE — Progress Notes (Addendum)
Cordell Coke to be D/C'd Home to facility. Ambulance transportign her. Pt left in W/C with PICC line.   Allergies as of 08/08/2016   No Known Allergies     Medication List    TAKE these medications   acetaminophen 325 MG tablet Commonly known as:  TYLENOL Take 2 tablets (650 mg total) by mouth every 6 (six) hours as needed for mild pain (or Fever >/= 101). What changed:  medication strength  how much to take  reasons to take this   cloNIDine 0.1 MG tablet Commonly known as:  CATAPRES Take 1 tablet (0.1 mg total) by mouth 2 (two) times daily.   famotidine 20 MG tablet Commonly known as:  PEPCID Take 1 tablet (20 mg total) by mouth daily.   ferrous sulfate 325 (65 FE) MG tablet Take 1 tablet (325 mg total) by mouth 2 (two) times daily with a meal.   folic acid 1 MG tablet Commonly known as:  FOLVITE Take 1 tablet (1 mg total) by mouth daily.   ibuprofen 200 MG tablet Commonly known as:  ADVIL,MOTRIN Take 600 mg by mouth every 6 (six) hours as needed for headache or moderate pain.   methocarbamol 750 MG tablet Commonly known as:  ROBAXIN Take 1 tablet (750 mg total) by mouth 3 (three) times daily.   multivitamin with minerals Tabs tablet Take 1 tablet by mouth daily.   nicotine 21 mg/24hr patch Commonly known as:  NICODERM CQ - dosed in mg/24 hours Place 1 patch (21 mg total) onto the skin daily.   Oxycodone HCl 10 MG Tabs Take 1 tablet (10 mg total) by mouth every 6 (six) hours as needed.   polyethylene glycol packet Commonly known as:  MIRALAX / GLYCOLAX Take 17 g by mouth daily as needed for mild constipation.   vancomycin IVPB Inject 1,000 mg into the vein every 12 (twelve) hours. GIVE EACH DOSE OVER 2 HOURS  Indication: endocarditis Last Day of Therapy:  08/21/16 Labs - 'Sunday/Monday:  CBC/D, BMP, and vancomycin trough Labs - Thursday:  BMP and vancomycin trough Labs - Every other week:  ESR and CRP            Home Infusion Instuctions         Start     Ordered   08/07/16 0000  Home infusion instructions [facility] May follow [facility] Dosing Protocol; May administer Cathflo as needed to maintain patency of vascular access device.; Flushing of vascular access device: per [facility] Protocol: 0.9% NaCl pre/post medica...    Question Answer Comment  Instructions May follow [facility] Dosing Protocol   Instructions May administer Cathflo as needed to maintain patency of vascular access device.   Instructions Flushing of vascular access device: per [facility] Protocol: 0.9% NaCl pre/post medication administration and prn patency; Heparin 100 u/ml, 5ml for implanted ports and Heparin 10u/ml, 5ml for all other central venous catheters.   Instructions May follow [facility] Anaphylaxis Protocol for First Dose Administration in the home: 0.9% NaCl at 25-50 ml/hr to maintain IV access for protocol meds. Epinephrine 0.3 ml IV/IM PRN and Benadryl 25-50 IV/IM PRN s/s of anaphylaxis.   Instructions [facility] Infusion Coordinator (RN) to assist per patient IV care needs in the home PRN.      02'$ /05/18 1051      Vitals:   08/07/16 2200 08/08/16 0459  BP: (!) 92/50 (!) 109/59  Pulse: 99 75  Resp: 16 16  Temp: 98.3 F (36.8 C) 97.7 F (  36.5 C)    Skin clean, dry and intact without evidence of skin break down, no evidence of skin tears noted. IV catheter discontinued intact. Site without signs and symptoms of complications. Dressing and pressure applied. Pt denies pain at this time. No complaints noted.  An After Visit Summary was printed and given to the patient. Patient escorted via [place], and D/C to facility via private ambulance.  Haywood Lasso BSN, RN [facility] Phone [phone]

## 2016-08-08 NOTE — Progress Notes (Addendum)
[facility] contacted [facility] about not receiving patient's discharge information. Patient's AVS, medication prescription, along with other patient information faxed over to facility. This RN also reached out to Facility, spoke with Anna GarnetKatrice,RN, to make sure that it was received. Patient's father, Anna Colon, also called the unit to check on status of patient's paperwork being faxed and was updated.   Veatrice KellsMahmoud,[name], RN

## 2016-08-08 NOTE — Progress Notes (Signed)
Pharmacy Antibiotic Note Anna Colon is a 36 y.o. 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 Pt maintained on vancomycin from 1/5 until 1/28 when pt developed pelvic rash and was transitioned to Linezolid. Vancomycin restarted on 08/01/16 at lower rate.  Vancomycin trough 2417mcg/mL today on 1g IV q12h.  Plan:  Vancomycin 1000mg  IV q12h through 08/21/2016 OPAT consult completed, please sign upon discharge   Height: 5\' 2"  (157.5 cm) Weight: 110 lb 0.2 oz (49.9 kg) IBW/kg (Calculated) : 50.1  Temp (24hrs), Avg:98.2 F (36.8 C), Min:97.7 F (36.5 C), Max:98.5 F (36.9 C)   Recent Labs Lab 08/03/16 1638 08/04/16 0440 08/05/16 0426 08/05/16 1643 08/08/16 1111  WBC  --   --  9.6  --   --   CREATININE 0.59 0.64  --  0.53 0.62  VANCOTROUGH 12*  --   --  26* 17    Estimated Creatinine Clearance: 75.8 mL/min (by C-G formula based on SCr of 0.62 mg/dL).    No Known Allergies  Antimicrobials this admission:  Vanc 1/5 >> 1/28 (developed puritic rash on torso 1/29); 1/30>> (2/19) Linezolid 1/28>> 1/30 Zosyn 1/5 >> 1/5  Dose adjustments this admission:  1/6 VT 15 - on vanc 750 mg IV q 8 hrs 1/8 VT 18 - on vanc 1g IV q 8 hrs 1/12 VT 24 drawn from PICC line 1.5hr early - will leave dose alone for now 1/16 @ 0415 measured VT @30  mcg/ml (only 5 hours after evening dose) so corrects to ~24 mcg/ml >> will reduce to 750 mg/8h 1/18 VT 16 >> continue Vanc 750 mg/8h 1/21 VT 18 >> continue vanc 750mg /8h 1/23 VT 8 (AM dose got held for dental procedure)-not trough 1/25 VT 22 >> change vanc to 1g/q12h - changed to q12h dosing to help at discharge. Estimated ke 0.0758 2/3 VT = 26 on 1500 mg IV q12h -- decrease to 1g q12h  2/6 VT = 17 on 1g IV q12h- continue  Microbiology results:  1/5 BCx: 2/2 MRSA 1/5 BCID - MRSA 1/5 UCx: neg 1/5  MRSA PCR: (+) 1/6 resp panel: neg 1/6 HCV Ab: 11, viral load not calc- spontaneous clearing per ID 1/6 BCx: 1/2 MRSA 1/7 BCx:1/2 staph aureus 1/9 Bcx: neg 1/18 peritoneal fluid: neg 1/27 BCx: neg   Anna Colon, PharmD, BCPS Clinical Pharmacist Pager: [phone] 08/08/2016 12:27 PM

## 2016-08-09 NOTE — Clinical Social Work Note (Signed)
CSW advised by 6[facility] nursing Director, Adaline Sillamara Caple that Ms. Balducci's dad called regarding patient not having her IV antibiotics at the skilled facility - [facility].  Patient's packet (containing prescriptions for oxycodone and vancomycin) did not go with her when she left hospital on Tuesday, 2/6. Six Conservation officer, natureast assistance director Ryanne was on the phone with patient's dad (who [facility] regarding the prescriptions and was upset that patient had not received the medications.   CSW contacted facility and talked with nursing secretary Mayra ReelSacoya regarding medications and was informed that they have the oxycodone and have ordered the vanc and will have it later today. Sacoya informed CSW that she received this information from ChiropodistAssistant Director of Nursing [employer] Parks. CSW shared this information with 6E Engineer, manufacturing systemsunit director and assistant director.  Genelle BalVanessa Gerry Blanchfield, MSW, LCSW Licensed Clinical Social Worker Clinical Social Work Department [facility] [phone]

## 2016-08-15 ENCOUNTER — Telehealth: Payer: Self-pay | Admitting: *Deleted

## 2016-08-15 NOTE — Telephone Encounter (Addendum)
[facility] for end date of IV antibiotic. Per pharmacy note from hospital end date is 08/21/16 and Dr. Luciana Axeomer agreed. She will get an order from their MD to pull picc line. [phone]

## 2016-08-29 ENCOUNTER — Encounter: Payer: Self-pay | Admitting: Internal Medicine

## 2016-08-29 ENCOUNTER — Ambulatory Visit (INDEPENDENT_AMBULATORY_CARE_PROVIDER_SITE_OTHER): Payer: Self-pay | Admitting: Internal Medicine

## 2016-08-29 VITALS — BP 154/105 | HR 76 | Temp 98.0°F | Ht 62.0 in | Wt 124.0 lb

## 2016-08-29 DIAGNOSIS — J984 Other disorders of lung: Secondary | ICD-10-CM

## 2016-08-29 DIAGNOSIS — I079 Rheumatic tricuspid valve disease, unspecified: Secondary | ICD-10-CM

## 2016-08-29 DIAGNOSIS — J189 Pneumonia, unspecified organism: Secondary | ICD-10-CM

## 2016-08-29 DIAGNOSIS — I368 Other nonrheumatic tricuspid valve disorders: Secondary | ICD-10-CM

## 2016-08-29 DIAGNOSIS — F1990 Other psychoactive substance use, unspecified, uncomplicated: Secondary | ICD-10-CM

## 2016-08-29 DIAGNOSIS — F17210 Nicotine dependence, cigarettes, uncomplicated: Secondary | ICD-10-CM

## 2016-08-29 DIAGNOSIS — R768 Other specified abnormal immunological findings in serum: Secondary | ICD-10-CM

## 2016-08-29 NOTE — Progress Notes (Signed)
   Subjective:    Patient ID: Anna CaterConstance Magan, female    DOB: [birth year], 36 y.o.   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     She completed her antibiotics mainly in the hospital and transferred to a SNF for the last week of treatment, which completed 1 week ago.  She is here with her father and has remained drug free.  Still with pain but no new complaints.  Feels the picc line is still in her but no symptoms.  Feels it is in her neck.  No fever, no chills, eating well.  Has not been to psychiatry or established with a PCP.  No palpatations.     Review of Systems  Constitutional: Positive for fatigue. Negative for activity change, chills and fever.  Cardiovascular: Negative for chest pain and leg swelling.  Gastrointestinal: Negative for diarrhea.  Skin: Negative for rash.  Neurological: Negative for dizziness.       Objective:   Physical Exam  Constitutional: She appears well-developed and well-nourished. No distress.  Eyes: No scleral icterus.  Cardiovascular: Normal rate, regular rhythm and normal heart sounds.  Exam reveals no friction rub.   No murmur heard. Pulmonary/Chest: Effort normal and breath sounds normal.  Skin: No rash noted.  Psychiatric: She has a normal mood and affect.    SH: remains drug free, + tobacco      Assessment & Plan:

## 2016-08-29 NOTE — Assessment & Plan Note (Signed)
I encouraged her to continue to remain drug free

## 2016-08-29 NOTE — Assessment & Plan Note (Signed)
Encouraged cessation.

## 2016-08-29 NOTE — Assessment & Plan Note (Signed)
From septic emboli.  No further treatment indicated

## 2016-08-29 NOTE — Assessment & Plan Note (Signed)
Resloved.  I will recheck her blood cultures today off treatment over 1 week.  If negative, can rtc prn

## 2016-08-29 NOTE — Assessment & Plan Note (Signed)
Viral RNA negative so no indication for treatment.

## 2016-09-06 LAB — CULTURE, BLOOD (SINGLE): Organism ID, Bacteria: NO GROWTH

## 2016-09-11 ENCOUNTER — Other Ambulatory Visit: Payer: Self-pay | Admitting: Cardiothoracic Surgery

## 2016-09-11 DIAGNOSIS — J90 Pleural effusion, not elsewhere classified: Secondary | ICD-10-CM

## 2016-09-13 ENCOUNTER — Ambulatory Visit: Payer: Self-pay | Admitting: Cardiothoracic Surgery

## 2016-12-03 ENCOUNTER — Emergency Department (HOSPITAL_BASED_OUTPATIENT_CLINIC_OR_DEPARTMENT_OTHER): Payer: Self-pay

## 2016-12-03 ENCOUNTER — Inpatient Hospital Stay (HOSPITAL_BASED_OUTPATIENT_CLINIC_OR_DEPARTMENT_OTHER)
Admission: EM | Admit: 2016-12-03 | Discharge: 2016-12-05 | DRG: 871 | Discharge status: Against Medical Advice | Payer: Self-pay | Attending: Internal Medicine | Admitting: Internal Medicine

## 2016-12-03 ENCOUNTER — Encounter (HOSPITAL_BASED_OUTPATIENT_CLINIC_OR_DEPARTMENT_OTHER): Payer: Self-pay | Admitting: *Deleted

## 2016-12-03 DIAGNOSIS — J441 Chronic obstructive pulmonary disease with (acute) exacerbation: Secondary | ICD-10-CM | POA: Diagnosis present

## 2016-12-03 DIAGNOSIS — F1990 Other psychoactive substance use, unspecified, uncomplicated: Secondary | ICD-10-CM | POA: Diagnosis present

## 2016-12-03 DIAGNOSIS — R251 Tremor, unspecified: Secondary | ICD-10-CM | POA: Diagnosis present

## 2016-12-03 DIAGNOSIS — I76 Septic arterial embolism: Secondary | ICD-10-CM | POA: Diagnosis present

## 2016-12-03 DIAGNOSIS — F17210 Nicotine dependence, cigarettes, uncomplicated: Secondary | ICD-10-CM | POA: Diagnosis present

## 2016-12-03 DIAGNOSIS — J440 Chronic obstructive pulmonary disease with acute lower respiratory infection: Secondary | ICD-10-CM | POA: Diagnosis present

## 2016-12-03 DIAGNOSIS — B1920 Unspecified viral hepatitis C without hepatic coma: Secondary | ICD-10-CM | POA: Diagnosis present

## 2016-12-03 DIAGNOSIS — F1410 Cocaine abuse, uncomplicated: Secondary | ICD-10-CM | POA: Diagnosis present

## 2016-12-03 DIAGNOSIS — F419 Anxiety disorder, unspecified: Secondary | ICD-10-CM | POA: Diagnosis present

## 2016-12-03 DIAGNOSIS — F1010 Alcohol abuse, uncomplicated: Secondary | ICD-10-CM | POA: Diagnosis present

## 2016-12-03 DIAGNOSIS — I330 Acute and subacute infective endocarditis: Secondary | ICD-10-CM | POA: Diagnosis present

## 2016-12-03 DIAGNOSIS — A419 Sepsis, unspecified organism: Secondary | ICD-10-CM | POA: Diagnosis present

## 2016-12-03 DIAGNOSIS — F329 Major depressive disorder, single episode, unspecified: Secondary | ICD-10-CM | POA: Diagnosis present

## 2016-12-03 DIAGNOSIS — A4102 Sepsis due to Methicillin resistant Staphylococcus aureus: Principal | ICD-10-CM | POA: Diagnosis present

## 2016-12-03 DIAGNOSIS — J189 Pneumonia, unspecified organism: Secondary | ICD-10-CM | POA: Diagnosis present

## 2016-12-03 DIAGNOSIS — F1910 Other psychoactive substance abuse, uncomplicated: Secondary | ICD-10-CM | POA: Diagnosis present

## 2016-12-03 DIAGNOSIS — E876 Hypokalemia: Secondary | ICD-10-CM | POA: Diagnosis present

## 2016-12-03 DIAGNOSIS — I2690 Septic pulmonary embolism without acute cor pulmonale: Secondary | ICD-10-CM | POA: Diagnosis present

## 2016-12-03 DIAGNOSIS — F1120 Opioid dependence, uncomplicated: Secondary | ICD-10-CM | POA: Diagnosis present

## 2016-12-03 LAB — COMPREHENSIVE METABOLIC PANEL
ALBUMIN: 3.2 g/dL — AB (ref 3.5–5.0)
ALT: 12 U/L — ABNORMAL LOW (ref 14–54)
ANION GAP: 11 (ref 5–15)
AST: 22 U/L (ref 15–41)
Alkaline Phosphatase: 92 U/L (ref 38–126)
BILIRUBIN TOTAL: 0.7 mg/dL (ref 0.3–1.2)
BUN: 11 mg/dL (ref 6–20)
CO2: 23 mmol/L (ref 22–32)
Calcium: 8.3 mg/dL — ABNORMAL LOW (ref 8.9–10.3)
Chloride: 97 mmol/L — ABNORMAL LOW (ref 101–111)
Creatinine, Ser: 0.76 mg/dL (ref 0.44–1.00)
GFR calc Af Amer: >60 mL/min (ref >60)
GFR calc non Af Amer: >60 mL/min (ref >60)
GLUCOSE: 133 mg/dL — AB (ref 65–99)
POTASSIUM: 2.9 mmol/L — AB (ref 3.5–5.1)
Sodium: 131 mmol/L — ABNORMAL LOW (ref 135–145)
TOTAL PROTEIN: 7 g/dL (ref 6.5–8.1)

## 2016-12-03 LAB — CBC WITH DIFFERENTIAL/PLATELET
BAND NEUTROPHILS: 5 %
BASOS ABS: 0 10*3/uL (ref 0.0–0.1)
Basophils Relative: 0 %
EOS ABS: 0 10*3/uL (ref 0.0–0.7)
Eosinophils Relative: 0 %
HEMATOCRIT: 36.8 % (ref 36.0–46.0)
HEMOGLOBIN: 12.3 g/dL (ref 12.0–15.0)
LYMPHS PCT: 8 %
Lymphs Abs: 1.7 10*3/uL (ref 0.7–4.0)
MCH: 27.3 pg (ref 26.0–34.0)
MCHC: 33.4 g/dL (ref 30.0–36.0)
MCV: 81.8 fL (ref 78.0–100.0)
MONOS PCT: 5 %
Monocytes Absolute: 1.1 10*3/uL — ABNORMAL HIGH (ref 0.1–1.0)
Neutro Abs: 18.4 10*3/uL — ABNORMAL HIGH (ref 1.7–7.7)
Neutrophils Relative %: 82 %
Platelets: 206 10*3/uL (ref 150–400)
RBC: 4.5 MIL/uL (ref 3.87–5.11)
RDW: 14.9 % (ref 11.5–15.5)
WBC: 21.2 10*3/uL — AB (ref 4.0–10.5)

## 2016-12-03 LAB — URINALYSIS, MICROSCOPIC (REFLEX)

## 2016-12-03 LAB — URINALYSIS, ROUTINE W REFLEX MICROSCOPIC
Glucose, UA: NEGATIVE mg/dL
Ketones, ur: 15 mg/dL — AB
NITRITE: NEGATIVE
PH: 6 (ref 5.0–8.0)
Protein, ur: >300 mg/dL — AB
SPECIFIC GRAVITY, URINE: 1.026 (ref 1.005–1.030)

## 2016-12-03 LAB — HCG, QUANTITATIVE, PREGNANCY: hCG, Beta Chain, Quant, S: 1 m[IU]/mL (ref <5)

## 2016-12-03 LAB — I-STAT CG4 LACTIC ACID, ED: Lactic Acid, Venous: 0.45 mmol/L — ABNORMAL LOW (ref 0.5–1.9)

## 2016-12-03 MED ORDER — ONDANSETRON HCL 4 MG/2ML IJ SOLN
INTRAMUSCULAR | Status: AC
  Filled 2016-12-03: qty 2

## 2016-12-03 MED ORDER — PIPERACILLIN-TAZOBACTAM 3.375 G IVPB
3.3750 g | Freq: Three times a day (TID) | INTRAVENOUS | Status: DC
  Administered 2016-12-04 (×2): 3.375 g via INTRAVENOUS
  Filled 2016-12-03 (×3): qty 50

## 2016-12-03 MED ORDER — SODIUM CHLORIDE 0.9 % IV BOLUS (SEPSIS)
1000.0000 mL | Freq: Once | INTRAVENOUS | Status: AC
  Administered 2016-12-03: 1000 mL via INTRAVENOUS

## 2016-12-03 MED ORDER — VANCOMYCIN HCL IN DEXTROSE 1-5 GM/200ML-% IV SOLN
1000.0000 mg | Freq: Once | INTRAVENOUS | Status: AC
  Administered 2016-12-03: 1000 mg via INTRAVENOUS
  Filled 2016-12-03: qty 200

## 2016-12-03 MED ORDER — ONDANSETRON HCL 4 MG/2ML IJ SOLN
4.0000 mg | Freq: Once | INTRAMUSCULAR | Status: AC
  Administered 2016-12-03: 4 mg via INTRAVENOUS

## 2016-12-03 MED ORDER — VANCOMYCIN HCL IN DEXTROSE 750-5 MG/150ML-% IV SOLN
750.0000 mg | Freq: Two times a day (BID) | INTRAVENOUS | Status: DC
  Administered 2016-12-04 – 2016-12-05 (×3): 750 mg via INTRAVENOUS
  Filled 2016-12-03 (×5): qty 150

## 2016-12-03 MED ORDER — POTASSIUM CHLORIDE CRYS ER 20 MEQ PO TBCR
40.0000 meq | EXTENDED_RELEASE_TABLET | Freq: Once | ORAL | Status: AC
  Administered 2016-12-03: 40 meq via ORAL
  Filled 2016-12-03: qty 2

## 2016-12-03 MED ORDER — SODIUM CHLORIDE 0.9 % IV BOLUS (SEPSIS): 1000.0000 mL | Freq: Once | INTRAVENOUS | Status: DC

## 2016-12-03 MED ORDER — PIPERACILLIN-TAZOBACTAM 3.375 G IVPB 30 MIN
3.3750 g | Freq: Once | INTRAVENOUS | Status: AC
  Administered 2016-12-03: 3.375 g via INTRAVENOUS
  Filled 2016-12-03 (×2): qty 50

## 2016-12-03 NOTE — ED Notes (Addendum)
Alert, NAD, calm, interactive, resps e/u, speaking in clear complete sentences, no dyspnea noted, skin W&D, VSS, c/o back pain, sob, fever, chills, hot and cold, nausea, weak, lethargy, "concerned for recurrent PNA", [facility] room[facility]  Attempted IV by US to L upper arm x2 unsuccessfully. Tolerated well. Dr. Rush Landmarkegeler aware.

## 2016-12-03 NOTE — ED Provider Notes (Signed)
[facility] Provider Note   CSN: [id number] Arrival date & time: 12/03/16  1724 By signing my name below, I, Levon Hedger, attest that this documentation has been prepared under the direction and in the presence of Tegeler, Canary Brim, MD . Electronically Signed: Levon Hedger, Scribe. 12/03/2016. 8:00 PM.   History   Chief Complaint Chief Complaint  Patient presents with  . Fever   HPI Anna Colon is a 36 y.o. female with a history of IV drug abuse, pneumonia, and endocarditis who presents to the Emergency Department complaining of persistent, gradually worsening fever (TMAX 103) onset three days ago. She reports associated nonproductive cough, sharp left-sided chest pain with radiation to her back, nausea, vomiting, and diaphoresis. She rates her chest pain 9/10 and states it is exacerbated by inspiration. Pt also reports hair loss x1-2 months.  Pt was admitted to [facility] in 01/18 for sepsis secondary to MRSA bacterium and endocarditis. Pt was treated with IV vancomycin for two months. She feels as if her current symptoms are similar to her infection six months ago. Pt states she last injected heroin in her right hand last night. Per pt, she occasionally injects into her feet. Pt states she typically reuses her needles that she rinses with tap water. She denies any diarrhea, constipation, dysuria or hematuria.   On further reassessment,  Patient reports that she licks her need before using them. She uses her needles  Numerous times in a row. Suspect this is cause of infection.  The history is provided by the patient. No language interpreter was used.  Fever   This is a new problem. The current episode started more than 2 days ago. The problem occurs constantly. The problem has been gradually worsening. The maximum temperature noted was 103 to 104 F. Associated symptoms include chest pain and vomiting. Pertinent negatives include no diarrhea and no congestion.    Past  Medical History:  Diagnosis Date  . Asthma   . Hepatitis C   . IVDU (intravenous drug user)     Patient Active Problem List   Diagnosis Date Noted  . Back pain   . Pain management   . FUO (fever of unknown origin)   . Polysubstance abuse   . Bursitis of right shoulder   . Pain   . Pyogenic arthritis of right shoulder region (HCC)   . Acute pain of right shoulder   . Septic embolism (HCC)   . Pressure injury of skin 07/14/2016  . Acute septic pulmonary embolism without acute cor pulmonale (HCC)   . Sepsis due to methicillin resistant Staphylococcus aureus (MRSA) (HCC)   . 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 Hepatitis C antibody test positive 07/08/2016  . Normocytic anemia 07/08/2016  . Thrombocytopenia (HCC) 07/08/2016  . MRSA bacteremia 07/07/2016  . Cavitary pneumonia 07/07/2016  . IVDU (intravenous drug user) 07/07/2016  . Cigarette smoker 07/07/2016  . ETOH abuse 07/07/2016    Past Surgical History:  Procedure Laterality Date  . CESAREAN SECTION    . MULTIPLE EXTRACTIONS WITH ALVEOLOPLASTY N/A 07/25/2016   Procedure: MULTIPLE EXTRACTION OF TOOTH #'s 18,19 and 21 thru 31  WITH ALVEOLOPLASTY;  Surgeon: Charlynne Pander, DDS;  Location: [facility];  Service: Oral Surgery;  Laterality: N/A;    OB History    No data available       Home Medications    Prior to Admission medications   Medication Sig Start Date End Date Taking? Authorizing Provider  acetaminophen (TYLENOL) 325  MG tablet Take 2 tablets (650 mg total) by mouth every 6 (six) hours as needed for mild pain (or Fever >/= 101). 08/07/16   Maxie BarbBhandari, Dron Prasad, MD  cloNIDine (CATAPRES) 0.1 MG tablet Take 1 tablet (0.1 mg total) by mouth 2 (two) times daily. 08/07/16   Maxie BarbBhandari, Dron Prasad, MD  famotidine (PEPCID) 20 MG tablet Take 1 tablet (20 mg total) by mouth daily. 08/08/16   Maxie BarbBhandari, Dron Prasad, MD  ferrous sulfate 325 (65 FE) MG tablet Take 1 tablet (325 mg total) by mouth 2 (two) times daily with a  meal. Patient not taking: Reported on 08/29/2016 08/07/16   Maxie BarbBhandari, Dron Prasad, MD  folic acid (FOLVITE) 1 MG tablet Take 1 tablet (1 mg total) by mouth daily. 08/08/16   Maxie BarbBhandari, Dron Prasad, MD  ibuprofen (ADVIL,MOTRIN) 200 MG tablet Take 600 mg by mouth every 6 (six) hours as needed for headache or moderate pain.    [provider]  methocarbamol (ROBAXIN) 750 MG tablet Take 1 tablet (750 mg total) by mouth 3 (three) times daily. 08/07/16   Maxie BarbBhandari, Dron Prasad, MD  Multiple Vitamin (MULTIVITAMIN WITH MINERALS) TABS tablet Take 1 tablet by mouth daily. Patient not taking: Reported on 08/29/2016 08/08/16   Maxie BarbBhandari, Dron Prasad, MD  nicotine (NICODERM CQ - DOSED IN MG/24 HOURS) 21 mg/24hr patch Place 1 patch (21 mg total) onto the skin daily. Patient not taking: Reported on 08/29/2016 08/07/16   Maxie BarbBhandari, Dron Prasad, MD  Oxycodone HCl 10 MG TABS Take 1 tablet (10 mg total) by mouth every 6 (six) hours as needed. Patient not taking: Reported on 08/29/2016 08/07/16   Maxie BarbBhandari, Dron Prasad, MD  polyethylene glycol [facility](MIRALAX / Ethelene HalGLYCOLAX) packet Take 17 g by mouth daily as needed for mild constipation. Patient not taking: Reported on 08/29/2016 08/07/16   Maxie BarbBhandari, Dron Prasad, MD    Family History History reviewed. No pertinent family history.  Social History Social History  Substance Use Topics  . Smoking status: Current Every Day Smoker    Packs/day: 1.00  . Smokeless tobacco: Never Used  . Alcohol use No     Allergies   Patient has no known allergies.   Review of Systems Review of Systems  Constitutional: Positive for diaphoresis and fever.  HENT: Negative for congestion.   Cardiovascular: Positive for chest pain.  Gastrointestinal: Positive for nausea and vomiting. Negative for constipation and diarrhea.  Genitourinary: Negative for dysuria and hematuria.  Musculoskeletal: Positive for back pain.  All other systems reviewed and are negative.  Physical Exam Updated Vital  Signs BP 117/88   Pulse 84   Temp 98.3 F (36.8 C) (Oral) Comment: took tylenol 2 hours PTA  Resp 18   Ht 5\' 2"  (1.575 m)   Wt 120 lb (54.4 kg)   LMP 11/02/2016   SpO2 100%   BMI 21.95 kg/m   Physical Exam  Constitutional: She is oriented to person, place, and time. She appears well-developed and well-nourished. No distress.  HENT:  Head: Normocephalic and atraumatic.  Eyes: EOM are normal.  Neck: Normal range of motion.  Cardiovascular: Normal rate and regular rhythm.   Murmur heard. Slight systolic murmur   Pulmonary/Chest: Effort normal and breath sounds normal.  Coarse breath sounds all over  Abdominal: Soft. She exhibits no distension. There is no tenderness.  Musculoskeletal: Normal range of motion.  Neurological: She is alert and oriented to person, place, and time.  No focal neurological deficits   Skin: Skin is warm. She is diaphoretic.  Injection sites over bilateral arms  Psychiatric: She has a normal mood and affect. Judgment normal.  Nursing note and vitals reviewed.  ED Treatments / Results  DIAGNOSTIC STUDIES:  Oxygen Saturation is 100% on RA, normal by my interpretation.    COORDINATION OF CARE:  7:54 PM Discussed treatment plan with pt at bedside and pt agreed to plan.   Labs (all labs ordered are listed, but only abnormal results are displayed) Labs Reviewed  COMPREHENSIVE METABOLIC PANEL - Abnormal; Notable for the following:       Result Value   Sodium 131 (*)    Potassium 2.9 (*)    Chloride 97 (*)    Glucose, Bld 133 (*)    Calcium 8.3 (*)    Albumin 3.2 (*)    ALT 12 (*)    All other components within normal limits  CBC WITH DIFFERENTIAL/PLATELET - Abnormal; Notable for the following:    WBC 21.2 (*)    Neutro Abs 18.4 (*)    Monocytes Absolute 1.1 (*)    All other components within normal limits  URINALYSIS, ROUTINE W REFLEX MICROSCOPIC - Abnormal; Notable for the following:    Color, Urine AMBER (*)    APPearance CLOUDY (*)     Hgb urine dipstick MODERATE (*)    Bilirubin Urine SMALL (*)    Ketones, ur 15 (*)    Protein, ur >300 (*)    Leukocytes, UA SMALL (*)    All other components within normal limits  URINALYSIS, MICROSCOPIC (REFLEX) - Abnormal; Notable for the following:    Bacteria, UA FEW (*)    Squamous Epithelial / LPF 6-30 (*)    All other components within normal limits  I-STAT CG4 LACTIC ACID, ED - Abnormal; Notable for the following:    Lactic Acid, Venous 0.45 (*)    All other components within normal limits  CULTURE, BLOOD (ROUTINE X 2)  CULTURE, BLOOD (ROUTINE X 2)  HCG, QUANTITATIVE, PREGNANCY  CBC  BASIC METABOLIC PANEL  I-STAT CG4 LACTIC ACID, ED    EKG  EKG Interpretation  Date/Time:  Sunday December 03 2016 17:40:43 EDT Ventricular Rate:  105 PR Interval:  112 QRS Duration: 82 QT Interval:  336 QTC Calculation: 444 R Axis:   74 Text Interpretation:  Sinus tachycardia T wave abnormality, consider inferior ischemia Abnormal ECG When compared to prior, no significant changes seen.  No STEMI Confirmed by Theda Belfast ([id number]) on 12/03/2016 8:02:02 PM       Radiology Dg Chest 2 View  Result Date: 12/03/2016 CLINICAL DATA:  Acute onset of fever and lower back pain. Sepsis. Chills, generalized weakness, cough and congestion. Initial encounter. EXAM: CHEST  2 VIEW COMPARISON:  Chest radiograph performed 08/07/2016 FINDINGS: Scattered bilateral pulmonary nodules have redistributed since the prior study, concerning for new septic emboli. No definite pleural effusion or pneumothorax is seen. Left hilar prominence is of uncertain significance and may reflect underlying pneumonia. The heart is normal in size. No acute osseous abnormalities are identified. IMPRESSION: Scattered small bilateral pulmonary nodules have redistributed since the prior study, concerning for new bilateral septic emboli. Left hilar prominence is of uncertain significance and may reflect underlying pneumonia. Electronically  Signed   By: Roanna Raider M.D.   On: 12/03/2016 18:56    Procedures Procedures (including critical care time)  EMERGENCY DEPARTMENT  US GUIDANCE EXAM Emergency Ultrasound:  US Guidance for Needle Guidance  INDICATIONS: Difficult vascular access Linear probe used in real-time to visualize location of needle entry  through skin.   PERFORMED BY: Myself IMAGES ARCHIVED?: Yes LIMITATIONS: Pain VIEWS USED: Transverse INTERPRETATION: Needle visualized within vein, Right arm and Needle gauge 20 [place] IV   CRITICAL CARE Performed by: Canary Brim Tegeler Total critical care time: 60 minutes Critical care time was exclusive of separately billable procedures and treating other patients. Critical care was necessary to treat or prevent imminent or life-threatening deterioration. Critical care was time spent personally by me on the following activities: development of treatment plan with patient and/or surrogate as well as nursing, discussions with consultants, evaluation of patient's response to treatment, examination of patient, obtaining history from patient or surrogate, ordering and performing treatments and interventions, ordering and review of laboratory studies, ordering and review of radiographic studies, pulse oximetry and re-evaluation of patient's condition.   Medications Ordered in ED Medications  sodium chloride 0.9 % bolus 1,000 mL (not administered)  sodium chloride 0.9 % bolus 1,000 mL (not administered)  piperacillin-tazobactam (ZOSYN) IVPB 3.375 g (not administered)  vancomycin (VANCOCIN) IVPB 1000 mg/200 mL premix (not administered)  potassium chloride SA (K-DUR,KLOR-CON) CR tablet 40 mEq (not administered)  vancomycin (VANCOCIN) IVPB 750 mg/150 ml premix (not administered)  piperacillin-tazobactam (ZOSYN) IVPB 3.375 g (not administered)     Initial Impression / Assessment and Plan / ED Course  I have reviewed the triage vital signs and the nursing notes.  Pertinent  labs & imaging results that were available during my care of the patient were reviewed by me and considered in my medical decision making (see chart for details).     Anna Colon is a 36 y.o. female with a history of IV drug abuse, pneumonia, and endocarditis who presents to the Emergency Department complaining of persistent, gradually worsening fever (TMAX 103) onset three days ago, Shortness of breath, cough, and occasional chest pain.  History and exam are seen above. On exam, patient has a mild systolic murmur. Patient also have course breath sounds bilaterally. Abdomen is nontender. Chest slightly tender to palpation. CVA areas nontender. No significant focal neurologic deficits. Patient has track marks on skin.  Ultrasound IV placed by me for access.  Based on report of needle licking before IV drug use, suspect infection. With the murmur and cough, clinical concern present for endocarditis or septic emboli. Labs in imaging were obtained showing evidence of leukocytosis. Pregnancy test negative. Lactic acid nonelevated. Potassium was low, this was supplemented.  Chest x-ray shows concern for septic emboli. EKG revealed tachycardia but no acute ischemia.  Patient started on broad-spectrum antibiotics and cultures were obtained. Patient admitted to hospitalist service for further treatment of likely endocarditis versus septic emboli and lungs. Patient given fluids and felt slightly better. Patient started on the antibiotics.  Patient admitted in stable condition for further management.   Final Clinical Impressions(s) / ED Diagnoses   Final diagnoses:  Pneumonia of both lungs due to infectious organism, unspecified part of lung     I personally performed the services described in this documentation, which was scribed in my presence. The recorded information has been reviewed and is accurate.  Clinical Impression: 1. Pneumonia of both lungs due to infectious organism, unspecified  part of lung     Disposition: Admit to Hospitalist service     Tegeler, Canary Brim, MD 12/04/16 [phone]

## 2016-12-03 NOTE — ED Notes (Signed)
Alert, NAD, calm, interactive, resps e/u, speaking in clear complete sentences, no dyspnea noted, skin W&D, VSS, reports continued pain and nausea, (denies: sob, dizziness or visual changes).

## 2016-12-03 NOTE — ED Notes (Signed)
Right radial artery punctured for labs, Allen's Test + before and after labs drawn.

## 2016-12-03 NOTE — ED Triage Notes (Signed)
Pt reports fever x 3 days.  Reports left lower back pain-hx of PNA.  States that she was septic in Jan with endocarditis.

## 2016-12-03 NOTE — Progress Notes (Signed)
Pharmacy Antibiotic Note  [name] is a 36 y.o. female admitted on 12/03/2016 with sepsis.  Pharmacy has been consulted for Vancomycin / Zosyn  dosing.  [facility]  Plan: Zosyn 3.375 grams iv Q 8 hours - 4 hr infusion Vancomycin 750 mg iv Q 12   Height: 5\' 2"  (157.5 cm) Weight: 120 lb (54.4 kg) IBW/kg (Calculated) : 50.1  Temp (24hrs), Avg:98.3 F (36.8 C), Min:98.3 F (36.8 C), Max:98.3 F (36.8 C)   Recent Labs Lab 12/03/16 1737 12/03/16 1843  WBC 21.2*  --   CREATININE 0.76  --   LATICACIDVEN  --  0.45*    Estimated Creatinine Clearance: 76.2 mL/min (by C-G formula based on SCr of 0.76 mg/dL).    No Known Allergies  Thank you Okey RegalLisa Ladarrious Kirksey, PharmD [phone]  12/03/2016 8:00 PM

## 2016-12-04 DIAGNOSIS — R7881 Bacteremia: Secondary | ICD-10-CM

## 2016-12-04 DIAGNOSIS — R079 Chest pain, unspecified: Secondary | ICD-10-CM

## 2016-12-04 DIAGNOSIS — A419 Sepsis, unspecified organism: Secondary | ICD-10-CM

## 2016-12-04 DIAGNOSIS — R918 Other nonspecific abnormal finding of lung field: Secondary | ICD-10-CM

## 2016-12-04 DIAGNOSIS — Z8679 Personal history of other diseases of the circulatory system: Secondary | ICD-10-CM

## 2016-12-04 DIAGNOSIS — R509 Fever, unspecified: Secondary | ICD-10-CM

## 2016-12-04 DIAGNOSIS — F1910 Other psychoactive substance abuse, uncomplicated: Secondary | ICD-10-CM

## 2016-12-04 DIAGNOSIS — F1110 Opioid abuse, uncomplicated: Secondary | ICD-10-CM

## 2016-12-04 LAB — CBC WITH DIFFERENTIAL/PLATELET
BASOS PCT: 0 %
Basophils Absolute: 0 10*3/uL (ref 0.0–0.1)
EOS ABS: 0 10*3/uL (ref 0.0–0.7)
EOS PCT: 0 %
HCT: 36.9 % (ref 36.0–46.0)
Hemoglobin: 12.1 g/dL (ref 12.0–15.0)
LYMPHS ABS: 1.5 10*3/uL (ref 0.7–4.0)
Lymphocytes Relative: 9 %
MCH: 27.2 pg (ref 26.0–34.0)
MCHC: 32.8 g/dL (ref 30.0–36.0)
MCV: 82.9 fL (ref 78.0–100.0)
MONO ABS: 0.8 10*3/uL (ref 0.1–1.0)
MONOS PCT: 5 %
Neutro Abs: 14.6 10*3/uL — ABNORMAL HIGH (ref 1.7–7.7)
Neutrophils Relative %: 86 %
PLATELETS: 154 10*3/uL (ref 150–400)
RBC: 4.45 MIL/uL (ref 3.87–5.11)
RDW: 15 % (ref 11.5–15.5)
WBC: 16.9 10*3/uL — ABNORMAL HIGH (ref 4.0–10.5)

## 2016-12-04 LAB — BLOOD CULTURE ID PANEL (REFLEXED)
Acinetobacter baumannii: NOT DETECTED
CANDIDA TROPICALIS: NOT DETECTED
Candida albicans: NOT DETECTED
Candida glabrata: NOT DETECTED
Candida krusei: NOT DETECTED
Candida parapsilosis: NOT DETECTED
ENTEROCOCCUS SPECIES: NOT DETECTED
Enterobacter cloacae complex: NOT DETECTED
Enterobacteriaceae species: NOT DETECTED
Escherichia coli: NOT DETECTED
HAEMOPHILUS INFLUENZAE: NOT DETECTED
KLEBSIELLA PNEUMONIAE: NOT DETECTED
Klebsiella oxytoca: NOT DETECTED
Listeria monocytogenes: NOT DETECTED
Methicillin resistance: DETECTED — AB
NEISSERIA MENINGITIDIS: NOT DETECTED
PROTEUS SPECIES: NOT DETECTED
Pseudomonas aeruginosa: NOT DETECTED
STAPHYLOCOCCUS AUREUS BCID: DETECTED — AB
STAPHYLOCOCCUS SPECIES: DETECTED — AB
STREPTOCOCCUS SPECIES: NOT DETECTED
Serratia marcescens: NOT DETECTED
Streptococcus agalactiae: NOT DETECTED
Streptococcus pneumoniae: NOT DETECTED
Streptococcus pyogenes: NOT DETECTED

## 2016-12-04 LAB — BASIC METABOLIC PANEL
Anion gap: 12 (ref 5–15)
BUN: 8 mg/dL (ref 6–20)
CALCIUM: 8.5 mg/dL — AB (ref 8.9–10.3)
CHLORIDE: 101 mmol/L (ref 101–111)
CO2: 22 mmol/L (ref 22–32)
Creatinine, Ser: 0.69 mg/dL (ref 0.44–1.00)
GFR calc non Af Amer: >60 mL/min (ref >60)
Glucose, Bld: 108 mg/dL — ABNORMAL HIGH (ref 65–99)
Potassium: 3.5 mmol/L (ref 3.5–5.1)
SODIUM: 135 mmol/L (ref 135–145)

## 2016-12-04 LAB — MAGNESIUM: Magnesium: 1.7 mg/dL (ref 1.7–2.4)

## 2016-12-04 LAB — MRSA PCR SCREENING: MRSA by PCR: POSITIVE — AB

## 2016-12-04 MED ORDER — LORAZEPAM 2 MG/ML IJ SOLN: 1.0000 mg | Freq: Four times a day (QID) | INTRAMUSCULAR | Status: DC | PRN

## 2016-12-04 MED ORDER — THIAMINE HCL 100 MG/ML IJ SOLN: 100.0000 mg | Freq: Every day | INTRAMUSCULAR | Status: DC

## 2016-12-04 MED ORDER — METHOCARBAMOL 750 MG PO TABS
750.0000 mg | ORAL_TABLET | Freq: Three times a day (TID) | ORAL | Status: DC
  Administered 2016-12-04 – 2016-12-05 (×5): 750 mg via ORAL
  Filled 2016-12-04 (×5): qty 1

## 2016-12-04 MED ORDER — LORAZEPAM 1 MG PO TABS: 1.0000 mg | ORAL_TABLET | Freq: Four times a day (QID) | ORAL | Status: DC | PRN

## 2016-12-04 MED ORDER — FOLIC ACID 1 MG PO TABS
1.0000 mg | ORAL_TABLET | Freq: Every day | ORAL | Status: DC
  Administered 2016-12-04 – 2016-12-05 (×2): 1 mg via ORAL
  Filled 2016-12-04: qty 1

## 2016-12-04 MED ORDER — POTASSIUM CHLORIDE CRYS ER 20 MEQ PO TBCR
40.0000 meq | EXTENDED_RELEASE_TABLET | Freq: Once | ORAL | Status: AC
  Administered 2016-12-04: 20 meq via ORAL
  Filled 2016-12-04: qty 2

## 2016-12-04 MED ORDER — NICOTINE 21 MG/24HR TD PT24
21.0000 mg | MEDICATED_PATCH | Freq: Every day | TRANSDERMAL | Status: DC
  Filled 2016-12-04 (×2): qty 1

## 2016-12-04 MED ORDER — ACETAMINOPHEN 325 MG PO TABS
650.0000 mg | ORAL_TABLET | Freq: Four times a day (QID) | ORAL | Status: DC | PRN
  Administered 2016-12-04: 650 mg via ORAL
  Filled 2016-12-04: qty 2

## 2016-12-04 MED ORDER — SODIUM CHLORIDE 0.9 % IV SOLN
INTRAVENOUS | Status: AC
  Administered 2016-12-04 – 2016-12-05 (×2): via INTRAVENOUS

## 2016-12-04 MED ORDER — KETOROLAC TROMETHAMINE 15 MG/ML IJ SOLN
7.5000 mg | Freq: Four times a day (QID) | INTRAMUSCULAR | Status: DC | PRN
  Administered 2016-12-04 – 2016-12-05 (×5): 7.5 mg via INTRAVENOUS
  Filled 2016-12-04 (×5): qty 1

## 2016-12-04 MED ORDER — MUPIROCIN 2 % EX OINT
1.0000 "application " | TOPICAL_OINTMENT | Freq: Two times a day (BID) | CUTANEOUS | Status: DC
  Administered 2016-12-04 – 2016-12-05 (×2): 1 via NASAL
  Filled 2016-12-04 (×2): qty 22

## 2016-12-04 MED ORDER — OXYCODONE HCL 5 MG PO TABS
10.0000 mg | ORAL_TABLET | Freq: Once | ORAL | Status: AC
  Administered 2016-12-04: 10 mg via ORAL
  Filled 2016-12-04: qty 2

## 2016-12-04 MED ORDER — FOLIC ACID 1 MG PO TABS: 1.0000 mg | ORAL_TABLET | Freq: Every day | ORAL | Status: DC

## 2016-12-04 MED ORDER — ONDANSETRON HCL 4 MG/2ML IJ SOLN
4.0000 mg | Freq: Four times a day (QID) | INTRAMUSCULAR | Status: DC | PRN
  Administered 2016-12-04: 4 mg via INTRAVENOUS
  Filled 2016-12-04: qty 2

## 2016-12-04 MED ORDER — OXYCODONE HCL 5 MG PO TABS
10.0000 mg | ORAL_TABLET | Freq: Four times a day (QID) | ORAL | Status: DC | PRN
Start: 2016-12-04 — End: 2016-12-05
  Administered 2016-12-04 – 2016-12-05 (×3): 10 mg via ORAL
  Filled 2016-12-04 (×3): qty 2

## 2016-12-04 MED ORDER — VITAMIN B-1 100 MG PO TABS
100.0000 mg | ORAL_TABLET | Freq: Every day | ORAL | Status: DC
  Administered 2016-12-04 – 2016-12-05 (×2): 100 mg via ORAL
  Filled 2016-12-04 (×2): qty 1

## 2016-12-04 MED ORDER — LORAZEPAM 2 MG/ML IJ SOLN
1.0000 mg | Freq: Four times a day (QID) | INTRAMUSCULAR | Status: DC | PRN
  Administered 2016-12-04 – 2016-12-05 (×4): 1 mg via INTRAVENOUS
  Filled 2016-12-04 (×4): qty 1

## 2016-12-04 MED ORDER — IBUPROFEN 600 MG PO TABS: 600.0000 mg | ORAL_TABLET | Freq: Four times a day (QID) | ORAL | Status: DC | PRN

## 2016-12-04 MED ORDER — CHLORHEXIDINE GLUCONATE CLOTH 2 % EX PADS
6.0000 | MEDICATED_PAD | Freq: Every day | CUTANEOUS | Status: DC
  Administered 2016-12-05: 6 via TOPICAL

## 2016-12-04 MED ORDER — FERROUS SULFATE 325 (65 FE) MG PO TABS
325.0000 mg | ORAL_TABLET | Freq: Two times a day (BID) | ORAL | Status: DC
  Administered 2016-12-04 – 2016-12-05 (×3): 325 mg via ORAL
  Filled 2016-12-04 (×3): qty 1

## 2016-12-04 MED ORDER — ADULT MULTIVITAMIN W/MINERALS CH
1.0000 | ORAL_TABLET | Freq: Every day | ORAL | Status: DC
  Administered 2016-12-04 – 2016-12-05 (×2): 1 via ORAL
  Filled 2016-12-04 (×2): qty 1

## 2016-12-04 MED ORDER — VITAMIN B-1 100 MG PO TABS: 100.0000 mg | ORAL_TABLET | Freq: Every day | ORAL | Status: DC

## 2016-12-04 MED ORDER — OXYCODONE HCL 5 MG PO TABS
10.0000 mg | ORAL_TABLET | Freq: Four times a day (QID) | ORAL | Status: DC | PRN
  Administered 2016-12-04: 10 mg via ORAL
  Filled 2016-12-04: qty 2

## 2016-12-04 MED ORDER — FAMOTIDINE 20 MG PO TABS
20.0000 mg | ORAL_TABLET | Freq: Every day | ORAL | Status: DC
  Administered 2016-12-04 – 2016-12-05 (×2): 20 mg via ORAL
  Filled 2016-12-04 (×2): qty 1

## 2016-12-04 MED ORDER — ADULT MULTIVITAMIN W/MINERALS CH: 1.0000 | ORAL_TABLET | Freq: Every day | ORAL | Status: DC

## 2016-12-04 MED ORDER — ENOXAPARIN SODIUM 40 MG/0.4ML ~~LOC~~ SOLN
40.0000 mg | SUBCUTANEOUS | Status: DC
  Administered 2016-12-04 – 2016-12-05 (×2): 40 mg via SUBCUTANEOUS
  Filled 2016-12-04 (×3): qty 0.4

## 2016-12-04 NOTE — H&P (Signed)
History and Physical    Anna Colon BJY:[id number] DOB: 1981/01/04 DOA: 12/03/2016  PCP: Patient, No Pcp Per  Patient coming from: Home  I have personally briefly reviewed patient's old medical records in [facility]  Chief Complaint: Fever  HPI: Anna Colon is a 36 y.o. female with medical history significant of IVDU, ongoing, last heroin use last night.  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  IV vanc for 2 months.  Had been doing well other than ongoing IVDU until 3 days ago, developed onset of fever Tm 103, nonproductive cough, sharp L sided CP with radiation to back, N/V, diaphoresis.   ED Course: WBC 21k, and most condemningly CXR demonstrates redistribution of the scattered small B pulmonary nodules concerning for new B septic emboli.  She is started on zosyn and vanc.  Review of Systems: As per HPI otherwise 10 point review of systems negative.   Past Medical History:  Diagnosis Date  . Asthma   . Hepatitis C   . IVDU (intravenous drug user)     Past Surgical History:  Procedure Laterality Date  . CESAREAN SECTION    . MULTIPLE EXTRACTIONS WITH ALVEOLOPLASTY N/A 07/25/2016   Procedure: MULTIPLE EXTRACTION OF TOOTH #'s 18,19 and 21 thru 31  WITH ALVEOLOPLASTY;  Surgeon: Charlynne Pander, DDS;  Location: [facility] OR;  Service: Oral Surgery;  Laterality: N/A;     reports that she has been smoking.  She has been smoking about 1.00 pack per day. She has never used smokeless tobacco. She reports that she uses drugs, including IV and Cocaine. She reports that she does not drink alcohol.  No Known Allergies  History reviewed. No pertinent family history.   Prior to Admission medications   Medication Sig Start Date End Date Taking? Authorizing Provider  acetaminophen (TYLENOL) 325 MG tablet Take 2 tablets (650 mg total) by mouth every 6 (six) hours as needed for mild pain (or Fever >/= 101). 08/07/16   Maxie Barb, MD  cloNIDine (CATAPRES)  0.1 MG tablet Take 1 tablet (0.1 mg total) by mouth 2 (two) times daily. 08/07/16   Maxie Barb, MD  famotidine (PEPCID) 20 MG tablet Take 1 tablet (20 mg total) by mouth daily. 08/08/16   Maxie Barb, MD  ferrous sulfate 325 (65 FE) MG tablet Take 1 tablet (325 mg total) by mouth 2 (two) times daily with a meal. Patient not taking: Reported on 08/29/2016 08/07/16   Maxie Barb, MD  folic acid (FOLVITE) 1 MG tablet Take 1 tablet (1 mg total) by mouth daily. 08/08/16   Maxie Barb, MD  ibuprofen (ADVIL,MOTRIN) 200 MG tablet Take 600 mg by mouth every 6 (six) hours as needed for headache or moderate pain.    [provider]  methocarbamol (ROBAXIN) 750 MG tablet Take 1 tablet (750 mg total) by mouth 3 (three) times daily. 08/07/16   Maxie Barb, MD  Multiple Vitamin (MULTIVITAMIN WITH MINERALS) TABS tablet Take 1 tablet by mouth daily. Patient not taking: Reported on 08/29/2016 08/08/16   Maxie Barb, MD  nicotine (NICODERM CQ - DOSED IN MG/24 HOURS) 21 mg/24hr patch Place 1 patch (21 mg total) onto the skin daily. Patient not taking: Reported on 08/29/2016 08/07/16   Maxie Barb, MD    Physical Exam: Vitals:   12/03/16 2300 12/03/16 2310 12/04/16 0007 12/04/16 0009  BP: 108/81 110/83 (!) 146/126 108/71  Pulse: 72 74 73   Resp: 19 16  18   Temp:      TempSrc:      SpO2: 100% 100% 99%   Weight:    55.1 kg (121 lb 6.4 oz)  Height:    5\' 2"  (1.575 m)    Constitutional: NAD, calm, comfortable Eyes: PERRL, lids and conjunctivae normal ENMT: Mucous membranes are moist. Posterior pharynx clear of any exudate or lesions.Normal dentition.  Neck: normal, supple, no masses, no thyromegaly Respiratory: clear to auscultation bilaterally, no wheezing, no crackles. Normal respiratory effort. No accessory muscle use.  Cardiovascular: Regular rate and rhythm, no murmurs / rubs / gallops. No extremity edema. 2+ pedal pulses. No carotid bruits.    Abdomen: no tenderness, no masses palpated. No hepatosplenomegaly. Bowel sounds positive.  Musculoskeletal: no clubbing / cyanosis. No joint deformity upper and lower extremities. Good ROM, no contractures. Normal muscle tone.  Skin: no rashes, lesions, ulcers. No induration Neurologic: CN 2-12 grossly intact. Sensation intact, DTR normal. Strength 5/5 in all 4.  Psychiatric: Normal judgment and insight. Alert and oriented x 3. Normal mood.    Labs on Admission: I have personally reviewed following labs and imaging studies  CBC:  Recent Labs Lab 12/03/16 1737  WBC 21.2*  NEUTROABS 18.4*  HGB 12.3  HCT 36.8  MCV 81.8  PLT 206   Basic Metabolic Panel:  Recent Labs Lab 12/03/16 1737  NA 131*  K 2.9*  CL 97*  CO2 23  GLUCOSE 133*  BUN 11  CREATININE 0.76  CALCIUM 8.3*   GFR: Estimated Creatinine Clearance: 76.2 mL/min (by C-G formula based on SCr of 0.76 mg/dL). Liver Function Tests:  Recent Labs Lab 12/03/16 1737  AST 22  ALT 12*  ALKPHOS 92  BILITOT 0.7  PROT 7.0  ALBUMIN 3.2*   No results for input(s): LIPASE, AMYLASE in the last 168 hours. No results for input(s): AMMONIA in the last 168 hours. Coagulation Profile: No results for input(s): INR, PROTIME in the last 168 hours. Cardiac Enzymes: No results for input(s): CKTOTAL, CKMB, CKMBINDEX, TROPONINI in the last 168 hours. BNP (last 3 results) No results for input(s): PROBNP in the last 8760 hours. HbA1C: No results for input(s): HGBA1C in the last 72 hours. CBG: No results for input(s): GLUCAP in the last 168 hours. Lipid Profile: No results for input(s): CHOL, HDL, LDLCALC, TRIG, CHOLHDL, LDLDIRECT in the last 72 hours. Thyroid Function Tests: No results for input(s): TSH, T4TOTAL, FREET4, T3FREE, THYROIDAB in the last 72 hours. Anemia Panel: No results for input(s): VITAMINB12, FOLATE, FERRITIN, TIBC, IRON, RETICCTPCT in the last 72 hours. Urine analysis:    Component Value Date/Time    COLORURINE AMBER (A) 12/03/2016 1737   APPEARANCEUR CLOUDY (A) 12/03/2016 1737   LABSPEC 1.026 12/03/2016 1737   PHURINE 6.0 12/03/2016 1737   GLUCOSEU NEGATIVE 12/03/2016 1737   HGBUR MODERATE (A) 12/03/2016 1737   BILIRUBINUR SMALL (A) 12/03/2016 1737   KETONESUR 15 (A) 12/03/2016 1737   PROTEINUR >300 (A) 12/03/2016 1737   NITRITE NEGATIVE 12/03/2016 1737   LEUKOCYTESUR SMALL (A) 12/03/2016 1737    Radiological Exams on Admission: Dg Chest 2 View  Result Date: 12/03/2016 CLINICAL DATA:  Acute onset of fever and lower back pain. Sepsis. Chills, generalized weakness, cough and congestion. Initial encounter. EXAM: CHEST  2 VIEW COMPARISON:  Chest radiograph performed 08/07/2016 FINDINGS: Scattered bilateral pulmonary nodules have redistributed since the prior study, concerning for new septic emboli. No definite pleural effusion or pneumothorax is seen. Left hilar prominence is of uncertain significance and may  reflect underlying pneumonia. The heart is normal in size. No acute osseous abnormalities are identified. IMPRESSION: Scattered small bilateral pulmonary nodules have redistributed since the prior study, concerning for new bilateral septic emboli. Left hilar prominence is of uncertain significance and may reflect underlying pneumonia. Electronically Signed   By: Roanna Raider M.D.   On: 12/03/2016 18:56    EKG: Independently reviewed.  Assessment/Plan Principal Problem:   Acute septic pulmonary embolism without acute cor pulmonale (HCC) Active Problems:   Sepsis (HCC)   IVDU (intravenous drug user)   Polysubstance abuse    1. Sepsis, Pulmonary nodules likely representing septic pulmonary emboli from probable bacterial endocarditis - 1. I am informed that BCx are drawn at [facility] and were taken to lab (despite no order being in chart, so I put one in).  Did confirm with lab at [facility] that they had 2 sets of BCx from patient, and I should just leave my order in so they could use that  order to process these cultures. 2. Continue Empiric zosyn and vanc 3. Will hold off on ordering echo for now as I suspect she likely will warrant TEE especially if BCx come back positive. 4. Tylenol PRN fever 5. Repeat CBC in AM to trend leukocytosis 2. Opiate addiction - 1. Prn oxycodone to prevent withdrawal symptoms while inpatient 2. Will get SW consult regarding substance abuse 3. Nicotine patch for smoking  DVT prophylaxis: Lovenox Code Status: Full Family Communication: No family in room Disposition Plan: TBD Consults called: None Admission status: Admit to inpatient - inpatient as patient very likely has bacterial endocarditis again.   Hillary Bow DO [facility] Pager [phone]  If 7AM-7PM, please contact day team taking care of patient [email or web address] Password [email or web address]  12/04/2016, 1:42 AM

## 2016-12-04 NOTE — Consult Note (Signed)
[facility] for Infectious Disease  Total days of antibiotics 2        Day 2 Vancomycin / Zosyn                Reason for Consult: Fevers, History of TV endocarditis     Referring Physician: Hongalgi   Principal Problem:   Acute septic pulmonary embolism without acute cor pulmonale ([place]) Active Problems:   Sepsis ([place])   IVDU (intravenous drug user)   Polysubstance abuse    HPI: Anna Colon is a 36 y.o. 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 She completed 8 weeks of IV vancomycin. She was evaluated by [facility] at that time and surgery was not indicated as she had no functional valve insufficiency. During that hospitalization she was found to have several lower mandibular abscesses - of which these teeth were extracted by Dr. Enrique Sack 07/25/16. She was last seen at [facility] 08/29/16 and was doing well. Clearance BCx obtained after completing Rx and were negative at that time. Reported at that time she abstained from IVDU.   She was doing well until 3 days ago where she developed sudden onset fever 103 degrees, N/V, diaphoresis, non-productive cough and sharp left sided chest pain with radiation to back. Presented to [facility] last night for the above and was transported to [facility]. Reports she was in [place] for a period of time and was staying clean until about a month ago when she moved back to [place] to live with a friend and relapsed. She denies any other drug use aside from heroin and tobacco use.   Past Medical History:  Diagnosis Date  . Asthma   . Hepatitis C   . IVDU (intravenous drug user)     Allergies: No Known Allergies  Current antibiotics:   MEDICATIONS: . enoxaparin (LOVENOX) injection  40 mg Subcutaneous Q24H  . famotidine  20 mg Oral Daily  . ferrous sulfate  325 mg Oral BID WC  . methocarbamol  750 mg Oral TID  . nicotine  21 mg Transdermal Daily    Social History  Substance Use  Topics  . Smoking status: Current Every Day Smoker    Packs/day: 1.00  . Smokeless tobacco: Never Used  . Alcohol use No    History reviewed. No pertinent family history.  Review of Systems - General ROS: positive for fevers and chills ENT ROS: negative for dental pain, sore throat or lymphadenopathy Respiratory ROS: positive for cough, left sided radiating chest pain. Negative for sputum.  Cardiovascular ROS: negative for dyspnea on exertion. Positive for leg swelling (clears by morning).  Gastrointestinal ROS: no abdominal pain, change in bowel habits, or black or bloody stools Genito-Urinary ROS: no dysuria, trouble voiding, or hematuria Musculoskeletal ROS: negative Neurological ROS: negative for stroke like symptoms, headache or visual changes Dermatological ROS: negative for rashes   OBJECTIVE: Temp:  [98.2 F (36.8 C)-101 F (38.3 C)] 98.2 F (36.8 C) (06/04 0735) Pulse Rate:  [66-103] 99 (06/04 0536) Resp:  [14-20] 18 (06/04 0536) BP: (101-146)/(57-126) 112/57 (06/04 0536) SpO2:  [96 %-100 %] 96 % (06/04 0536) Weight:  [120 lb (54.4 kg)-121 lb 6.4 oz (55.1 kg)] 121 lb 6.4 oz (55.1 kg) (06/04 0009)   General appearance: alert, no distress and pale. Diaphoretic. Anxious.  Eyes: conjunctivae/corneas clear. PERRL, EOM's intact. Fundi benign. Neck: no adenopathy and no JVD Resp: clear to auscultation bilaterally anteriorly, diminished bibasilar  Cardio: S1, S2 normal and + 1/2 SEM to left sternal boarder GI: soft, non-tender; bowel sounds normal; no masses,  no organomegaly Extremities: extremities normal, atraumatic, no cyanosis or edema Pulses: 2+ and symmetric Skin: Skin color, texture, turgor normal. No rashes or lesions or hypopigmented areas noted to face Neurologic: Alert and oriented X 3, normal strength and tone. Normal symmetric reflexes. Normal coordination and gait  LABS: Results for orders placed or performed during the hospital encounter of 12/03/16 (from  the past 48 hour(s))  Comprehensive metabolic panel     Status: Abnormal   Collection Time: 12/03/16  5:37 PM  Result Value Ref Range   Sodium 131 (L) 135 - 145 mmol/L   Potassium 2.9 (L) 3.5 - 5.1 mmol/L   Chloride 97 (L) 101 - 111 mmol/L   CO2 23 22 - 32 mmol/L   Glucose, Bld 133 (H) 65 - 99 mg/dL   BUN 11 6 - 20 mg/dL   Creatinine, Ser 0.76 0.44 - 1.00 mg/dL   Calcium 8.3 (L) 8.9 - 10.3 mg/dL   Total Protein 7.0 6.5 - 8.1 g/dL   Albumin 3.2 (L) 3.5 - 5.0 g/dL   AST 22 15 - 41 U/L   ALT 12 (L) 14 - 54 U/L   Alkaline Phosphatase 92 38 - 126 U/L   Total Bilirubin 0.7 0.3 - 1.2 mg/dL   GFR calc non Af Amer >60 >60 mL/min   GFR calc Af Amer >60 >60 mL/min    Comment: (NOTE) The eGFR has been calculated using the CKD EPI equation. This calculation has not been validated in all clinical situations. eGFR's persistently <60 mL/min signify possible Chronic Kidney Disease.    Anion gap 11 5 - 15  CBC with Differential     Status: Abnormal   Collection Time: 12/03/16  5:37 PM  Result Value Ref Range   WBC 21.2 (H) 4.0 - 10.5 K/uL   RBC 4.50 3.87 - 5.11 MIL/uL   Hemoglobin 12.3 12.0 - 15.0 g/dL   HCT 36.8 36.0 - 46.0 %   MCV 81.8 78.0 - 100.0 fL   MCH 27.3 26.0 - 34.0 pg   MCHC 33.4 30.0 - 36.0 g/dL   RDW 14.9 11.5 - 15.5 %   Platelets 206 150 - 400 K/uL   Neutrophils Relative % 82 %   Lymphocytes Relative 8 %   Monocytes Relative 5 %   Eosinophils Relative 0 %   Basophils Relative 0 %   Band Neutrophils 5 %   Neutro Abs 18.4 (H) 1.7 - 7.7 K/uL   Lymphs Abs 1.7 0.7 - 4.0 K/uL   Monocytes Absolute 1.1 (H) 0.1 - 1.0 K/uL   Eosinophils Absolute 0.0 0.0 - 0.7 K/uL   Basophils Absolute 0.0 0.0 - 0.1 K/uL  Urinalysis, Routine w reflex microscopic     Status: Abnormal   Collection Time: 12/03/16  5:37 PM  Result Value Ref Range   Color, Urine AMBER (A) YELLOW    Comment: BIOCHEMICALS MAY BE AFFECTED BY COLOR   APPearance CLOUDY (A) CLEAR   Specific Gravity, Urine 1.026 1.005 -  1.030   pH 6.0 5.0 - 8.0   Glucose, UA NEGATIVE NEGATIVE mg/dL   Hgb urine dipstick MODERATE (A) NEGATIVE   Bilirubin Urine SMALL (A) NEGATIVE   Ketones, ur 15 (A) NEGATIVE mg/dL   Protein, ur >300 (A) NEGATIVE mg/dL   Nitrite NEGATIVE NEGATIVE   Leukocytes, UA SMALL (A) NEGATIVE  Urinalysis, Microscopic (reflex)  Status: Abnormal   Collection Time: 12/03/16  5:37 PM  Result Value Ref Range   RBC / HPF 6-30 0 - 5 RBC/hpf   WBC, UA 6-30 0 - 5 WBC/hpf   Bacteria, UA FEW (A) NONE SEEN   Squamous Epithelial / LPF 6-30 (A) NONE SEEN   Urine-Other TRICHOMONAS PRESENT   hCG, quantitative, pregnancy     Status: None   Collection Time: 12/03/16  6:32 PM  Result Value Ref Range   hCG, Beta Chain, Quant, S 1 <5 mIU/mL    Comment: REPEATED TO VERIFY Corrected report called to Wallace RN @ 2054 ON 12/03/2016 BY Punjtan,G CORRECTED ON 06/03 AT 2049: PREVIOUSLY REPORTED AS <1050          GEST. AGE      CONC.  (mIU/mL)   <=1 WEEK        5   50     2 WEEKS       50   500     3 WEEKS       100   10,000     4 WEEKS     1,000   30,000     5 WEEKS     3,500   115,000   6 8 WEEKS      12,000   270,000    12 WEEKS     15,000   220,000        FEMALE AND NON PREGNANT FEMALE:     LESS THAN 5 mIU/mL   I-Stat CG4 Lactic Acid, ED     Status: Abnormal   Collection Time: 12/03/16  6:43 PM  Result Value Ref Range   Lactic Acid, Venous 0.45 (L) 0.5 - 1.9 mmol/L  Magnesium     Status: None   Collection Time: 12/04/16  8:45 AM  Result Value Ref Range   Magnesium 1.7 1.7 - 2.4 mg/dL    MICRO:  IMAGING: Dg Chest 2 View  Result Date: 12/03/2016 CLINICAL DATA:  Acute onset of fever and lower back pain. Sepsis. Chills, generalized weakness, cough and congestion. Initial encounter. EXAM: CHEST  2 VIEW COMPARISON:  Chest radiograph performed 08/07/2016 FINDINGS: Scattered bilateral pulmonary nodules have redistributed since the prior study, concerning for new septic emboli. No definite pleural effusion or  pneumothorax is seen. Left hilar prominence is of uncertain significance and may reflect underlying pneumonia. The heart is normal in size. No acute osseous abnormalities are identified. IMPRESSION: Scattered small bilateral pulmonary nodules have redistributed since the prior study, concerning for new bilateral septic emboli. Left hilar prominence is of uncertain significance and may reflect underlying pneumonia. Electronically Signed   By: Garald Balding M.D.   On: 12/03/2016 18:56    HISTORICAL MICRO/IMAGING  Assessment/Plan:  36 yo female with fevers, septic pulmonary emboli/PNA and history of previously treated MRSA TV IE.   Fevers =  - BCx pending from 6/3 - Would continue empiric Vancomycin + Zosyn while we await culture data - Follow fever and wbc curves  Chest Pain =  - CXR showing scattered b/l areas of pulmonary nodules concerning for septic emboli with left hilar prominence concerning for possible PNA - Would recommend TEE over TTE with potential new murmur   IVDU/SA =  - Recheck HIV, HCV Ab today with high risk history - Completed her IV treatment at SNF last time, however she tells me "she is not going back there again." SW has been consulted for assistance  Janene Madeira, MSN, NP-C [facility]  for Infectious Disease  Medical Group Cell: [phone] Pager: [phone] 12/04/2016 11:09 AM    ADDENDUM:  6/3 BCID + MRSA --> D/C Zosyn. Repeat BCx 6/5 for clearance. HCV Ab + January    Janene Madeira, MSN, NP-C [facility] for Infectious [place] Group Cell: [phone] Pager: [phone]  12/04/2016 3:27 PM

## 2016-12-04 NOTE — Progress Notes (Addendum)
Pt arrived to 5w28 via EMS form [facility]. Pt identified, by two nurses, VS obtained and pt in no acute distress.  Admission paged.  Pt alert and oriented x 4, oriented to room and equipment, instructed to use call bell for assistance. Pt advised about valuable policy. Will continue to monitor and treat pt per MD orders.

## 2016-12-04 NOTE — Progress Notes (Addendum)
PROGRESS NOTE   Anna Colon  ZOX:[id number]    DOB: 1980/08/15    DOA: 12/03/2016  PCP: Patient, No Pcp Per   I have briefly reviewed patients [facility].  Brief Narrative:   36 year old female with PMH of active IVDA ( last use was 2 days PTA), hospitalized 07/07/16- 08/08/16 for tricuspid valve endocarditis with MRSA bacteremia, cavitatory pneumonia from septic emboli, right shoulder septic arthritis, dental extractions for dental abscess, completed IV antibiotics via PICC line mostly in the hospital and remainder approximately 1 week at SNF, thoracentesis for pleural effusion, hepatitis C antibody positive but viral RNA negative , tobacco abuse, alcohol abuse, presented with fever of 103F, nonproductive cough, sharp left-sided pleuritic chest pain with radiation to back, nausea, vomiting and diaphoresis. 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 Infectious disease consulted.   Assessment & Plan:   Principal Problem:   Acute septic pulmonary embolism without acute cor pulmonale ([facility]) Active Problems:   Sepsis ([facility])   IVDU (intravenous drug user)   Polysubstance abuse   1. 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 Past history as indicated above. Chest x-ray personally reviewed and suspicious for new bilateral septic emboli. WBC 21.2. Blood cultures 2: Pending. Started empirically on IV vancomycin and Zosyn, continue. Infectious disease consulted. 2-D echo 07/08/16 had shown a large vegetation (10 x 11 mm) on the anterior leaflet of right cuspid valve. Patient does not remember having TEE done.Since previous vegetation was seen on TTE, will repeat TTE first and await ID input regarding need for TEE Supportive/symptomatic treatment. 2. Hypokalemia: Better. Continue to replace and follow BMP. Magnesium: 1.7. 3. Polysubstance abuse (tobacco, alcohol, IVDA-heroin, cocaine): Cessation counseled. Continue  nicotine patch. CIWA protocol.? Opiate addiction-on when necessary oxycodone. 4. Hepatitis C antibody positive: Viral RNA was negative. Repeating hep C antibody. 5. Leukocytosis: Secondary to sepsis. Follow CBCs.   DVT prophylaxis: Lovenox Code Status: Full Family Communication: None at bedside. Disposition: To be determined. If needs prolonged IV antibiotics, will likely have to go back to SNF.   Consultants:  Infectious disease   Procedures:  None  Antimicrobials:  IV vancomycin and Zosyn    Subjective: Interviewed and examined along with patient's female RN in room. Reports generalized pains and asking for IV Dilaudid. Reports using IV drugs 2 days prior to admission. Ongoing left-sided pleuritic type of chest pain. No dyspnea reported.  ROS: [place] and [place] area couple of weeks ago. No dysuria or urinary frequency. Multiple skin scars.  Objective:  Vitals:   12/04/16 0009 12/04/16 0536 12/04/16 0604 12/04/16 0735  BP: 108/71 (!) 112/57    Pulse:  99    Resp:  18    Temp:  (!) 100.7 F (38.2 C) (!) 101 F (38.3 C) 98.2 F (36.8 C)  TempSrc:  Oral Axillary Axillary  SpO2:  96%    Weight: 55.1 kg (121 lb 6.4 oz)     Height: 5\' 2"  (1.575 m)       Examination:  General exam: Pleasant young female, moderately built and frail, lying comfortably propped up in bed. Does not look septic or toxic currently. Respiratory system: Slightly harsh breath sounds bilaterally but no wheezing, rhonchi or crackles appreciated. Respiratory effort normal. Cardiovascular system: S1 & S2 heard, RRR. No JVD, rubs, gallops or clicks. Short systolic murmur appreciated at apex. No pedal edema. Telemetry personally reviewed: Sinus rhythm. Gastrointestinal system: Abdomen  is nondistended, soft and nontender. No organomegaly or masses felt. Normal bowel sounds heard. Central nervous system: Alert and oriented. No focal neurological  deficits. Extremities: Symmetric 5 x 5 power. Skin: Multiple scars over upper and lower extremities. Also noted needle tracks on forearms. No acute lesions appreciated. Psychiatry: Judgement and insight appear normal. Mood & affect appropriate.     Data Reviewed: I have personally reviewed following labs and imaging studies  CBC:  Recent Labs Lab 12/03/16 1737  WBC 21.2*  NEUTROABS 18.4*  HGB 12.3  HCT 36.8  MCV 81.8  PLT 206   Basic Metabolic Panel:  Recent Labs Lab 12/03/16 1737 12/04/16 0845  NA 131* 135  K 2.9* 3.5  CL 97* 101  CO2 23 22  GLUCOSE 133* 108*  BUN 11 8  CREATININE 0.76 0.69  CALCIUM 8.3* 8.5*  MG  --  1.7   Liver Function Tests:  Recent Labs Lab 12/03/16 1737  AST 22  ALT 12*  ALKPHOS 92  BILITOT 0.7  PROT 7.0  ALBUMIN 3.2*       Radiology Studies: Dg Chest 2 View  Result Date: 12/03/2016 CLINICAL DATA:  Acute onset of fever and lower back pain. Sepsis. Chills, generalized weakness, cough and congestion. Initial encounter. EXAM: CHEST  2 VIEW COMPARISON:  Chest radiograph performed 08/07/2016 FINDINGS: Scattered bilateral pulmonary nodules have redistributed since the prior study, concerning for new septic emboli. No definite pleural effusion or pneumothorax is seen. Left hilar prominence is of uncertain significance and may reflect underlying pneumonia. The heart is normal in size. No acute osseous abnormalities are identified. IMPRESSION: Scattered small bilateral pulmonary nodules have redistributed since the prior study, concerning for new bilateral septic emboli. Left hilar prominence is of uncertain significance and may reflect underlying pneumonia. Electronically Signed   By: Roanna Raider M.D.   On: 12/03/2016 18:56        Scheduled Meds: . enoxaparin (LOVENOX) injection  40 mg Subcutaneous Q24H  . famotidine  20 mg Oral Daily  . ferrous sulfate  325 mg Oral BID WC  . methocarbamol  750 mg Oral TID  . nicotine  21 mg  Transdermal Daily   Continuous Infusions: . piperacillin-tazobactam (ZOSYN)  IV Stopped (12/04/16 0724)  . sodium chloride    . vancomycin 750 mg (12/04/16 0952)     LOS: 1 day     Anna Berman, MD, FACP, FHM. [facility] Pager [phone]  If 7PM-7AM, please contact night-coverage [email or web address] Password [email or web address] 12/04/2016, 11:09 AM '

## 2016-12-04 NOTE — Progress Notes (Signed)
  PHARMACY - PHYSICIAN COMMUNICATION CRITICAL VALUE ALERT - BLOOD CULTURE IDENTIFICATION (BCID)  Results for orders placed or performed during the hospital encounter of 07/07/16  Blood Culture ID Panel (Reflexed) (Collected: 07/07/2016 12:40 AM)  Result Value Ref Range   Enterococcus species NOT DETECTED NOT DETECTED   Listeria monocytogenes NOT DETECTED NOT DETECTED   Staphylococcus species DETECTED (A) NOT DETECTED   Staphylococcus aureus DETECTED (A) NOT DETECTED   Methicillin resistance DETECTED (A) NOT DETECTED   Streptococcus species NOT DETECTED NOT DETECTED   Streptococcus agalactiae NOT DETECTED NOT DETECTED   Streptococcus pneumoniae NOT DETECTED NOT DETECTED   Streptococcus pyogenes NOT DETECTED NOT DETECTED   Acinetobacter baumannii NOT DETECTED NOT DETECTED   Enterobacteriaceae species NOT DETECTED NOT DETECTED   Enterobacter cloacae complex NOT DETECTED NOT DETECTED   Escherichia coli NOT DETECTED NOT DETECTED   Klebsiella oxytoca NOT DETECTED NOT DETECTED   Klebsiella pneumoniae NOT DETECTED NOT DETECTED   Proteus species NOT DETECTED NOT DETECTED   Serratia marcescens NOT DETECTED NOT DETECTED   Haemophilus influenzae NOT DETECTED NOT DETECTED   Neisseria meningitidis NOT DETECTED NOT DETECTED   Pseudomonas aeruginosa NOT DETECTED NOT DETECTED   Candida albicans NOT DETECTED NOT DETECTED   Candida glabrata NOT DETECTED NOT DETECTED   Candida krusei NOT DETECTED NOT DETECTED   Candida parapsilosis NOT DETECTED NOT DETECTED   Candida tropicalis NOT DETECTED NOT DETECTED    Name of physician (or Provider) Contacted: A. Hongalgi  ID saw earlier today but cx not back yet. BCID shows MRSA.  Changes to prescribed antibiotics required: Continue vancomycin, could probably stop Zosyn  Enzo BiNathan Devera Englander, PharmD, [facility] Clinical Pharmacist Pager [phone] 12/04/2016 2:33 PM

## 2016-12-04 NOTE — ED Notes (Addendum)
Second set of blood cultures obtained. Order not placed yet. Specimen sent to lab. IV abx started.

## 2016-12-04 NOTE — Progress Notes (Signed)
RN notified by lab, pt refused blood to be drawn. She agreed to have labs drawn tomorrow morning on 12/05/16. MD notified.

## 2016-12-05 ENCOUNTER — Inpatient Hospital Stay (HOSPITAL_COMMUNITY): Payer: Self-pay

## 2016-12-05 DIAGNOSIS — J13 Pneumonia due to Streptococcus pneumoniae: Secondary | ICD-10-CM

## 2016-12-05 DIAGNOSIS — I2690 Septic pulmonary embolism without acute cor pulmonale: Secondary | ICD-10-CM

## 2016-12-05 DIAGNOSIS — F1990 Other psychoactive substance use, unspecified, uncomplicated: Secondary | ICD-10-CM

## 2016-12-05 DIAGNOSIS — F17210 Nicotine dependence, cigarettes, uncomplicated: Secondary | ICD-10-CM

## 2016-12-05 DIAGNOSIS — F1010 Alcohol abuse, uncomplicated: Secondary | ICD-10-CM

## 2016-12-05 LAB — BASIC METABOLIC PANEL
Anion gap: 10 (ref 5–15)
BUN: 10 mg/dL (ref 6–20)
CALCIUM: 8.5 mg/dL — AB (ref 8.9–10.3)
CO2: 22 mmol/L (ref 22–32)
CREATININE: 0.72 mg/dL (ref 0.44–1.00)
Chloride: 103 mmol/L (ref 101–111)
GFR calc non Af Amer: >60 mL/min (ref >60)
Glucose, Bld: 108 mg/dL — ABNORMAL HIGH (ref 65–99)
Potassium: 4.1 mmol/L (ref 3.5–5.1)
Sodium: 135 mmol/L (ref 135–145)

## 2016-12-05 LAB — HIV ANTIBODY (ROUTINE TESTING W REFLEX): HIV Screen 4th Generation wRfx: NONREACTIVE

## 2016-12-05 LAB — CBC WITH DIFFERENTIAL/PLATELET
BASOS PCT: 0 %
Basophils Absolute: 0 10*3/uL (ref 0.0–0.1)
EOS ABS: 0.1 10*3/uL (ref 0.0–0.7)
Eosinophils Relative: 1 %
HEMATOCRIT: 33 % — AB (ref 36.0–46.0)
Hemoglobin: 10.5 g/dL — ABNORMAL LOW (ref 12.0–15.0)
LYMPHS ABS: 1.6 10*3/uL (ref 0.7–4.0)
Lymphocytes Relative: 13 %
MCH: 26 pg (ref 26.0–34.0)
MCHC: 31.8 g/dL (ref 30.0–36.0)
MCV: 81.7 fL (ref 78.0–100.0)
MONO ABS: 0.9 10*3/uL (ref 0.1–1.0)
MONOS PCT: 7 %
NEUTROS ABS: 9.9 10*3/uL — AB (ref 1.7–7.7)
Neutrophils Relative %: 79 %
Platelets: 191 10*3/uL (ref 150–400)
RBC: 4.04 MIL/uL (ref 3.87–5.11)
RDW: 14.5 % (ref 11.5–15.5)
WBC: 12.6 10*3/uL — ABNORMAL HIGH (ref 4.0–10.5)

## 2016-12-05 LAB — HEPATITIS C ANTIBODY: HCV Ab: >11 s/co ratio — ABNORMAL HIGH (ref 0.0–0.9)

## 2016-12-05 MED ORDER — LORAZEPAM 1 MG PO TABS: 1.0000 mg | ORAL_TABLET | ORAL | Status: DC | PRN

## 2016-12-05 MED ORDER — LORAZEPAM 2 MG/ML IJ SOLN
1.0000 mg | INTRAMUSCULAR | Status: DC | PRN
  Administered 2016-12-05: 1 mg via INTRAVENOUS
  Filled 2016-12-05: qty 1

## 2016-12-05 MED ORDER — PREDNISONE 20 MG PO TABS
40.0000 mg | ORAL_TABLET | Freq: Every day | ORAL | Status: DC
  Administered 2016-12-05: 40 mg via ORAL
  Filled 2016-12-05: qty 2

## 2016-12-05 MED ORDER — HYDROXYZINE HCL 25 MG PO TABS
50.0000 mg | ORAL_TABLET | Freq: Three times a day (TID) | ORAL | Status: DC | PRN
  Administered 2016-12-05: 50 mg via ORAL
  Filled 2016-12-05: qty 2

## 2016-12-05 MED ORDER — LEVALBUTEROL HCL 1.25 MG/0.5ML IN NEBU: 1.2500 mg | INHALATION_SOLUTION | Freq: Four times a day (QID) | RESPIRATORY_TRACT | Status: DC | PRN

## 2016-12-05 NOTE — Progress Notes (Signed)
PROGRESS NOTE   Anna Colon  WUJ:[id number]    DOB: Feb 27, 1981    DOA: 12/03/2016  PCP: Patient, No Pcp Per   I have briefly reviewed patients previous medical records in [facility].  Brief Narrative:   36 year old female with PMH of active IVDA ( last use was 2 days PTA), hospitalized 07/07/16- 08/08/16 for tricuspid valve endocarditis with MRSA bacteremia, cavitatory pneumonia from septic emboli, right shoulder septic arthritis, dental extractions for dental abscess, completed IV antibiotics via PICC line mostly in the hospital and remainder approximately 1 week at SNF, thoracentesis for pleural effusion, hepatitis C antibody positive but viral RNA negative , tobacco abuse, alcohol abuse, presented with fever of 103F, nonproductive cough, sharp left-sided pleuritic chest pain with radiation to back, nausea, vomiting and diaphoresis. 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 Infectious disease consulted.   Assessment & Plan:   Principal Problem:   Acute septic pulmonary embolism without acute cor pulmonale (HCC) Active Problems:   Sepsis (HCC)   IVDU (intravenous drug user)   Polysubstance abuse   1. 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 Past history as indicated above. Chest x-ray personally reviewed and suspicious for new bilateral septic emboli. WBC 21.2. Blood cultures 2: Pending. Started empirically on IV vancomycin and Zosyn, continue. Infectious disease consulted. 2-D echo 07/08/16 had shown a large vegetation (10 x 11 mm) on the anterior leaflet of right cuspid valve. Patient does not remember having TEE done. TTE pending, will arrange for TEE. Continue vancomycin while awaiting blood culture clearance prior to PICC line. Duration to be specified by infectious disease. Leukocytosis improving 2. Hypokalemia: Better. Continue to replace and follow BMP. Magnesium: 1.7. 3. Polysubstance abuse (tobacco, alcohol,  IVDA-heroin, cocaine): Cessation counseled. Continue nicotine patch. CIWA protocol.? Opiate addiction-on when necessary oxycodone. 4. Hepatitis C antibody positive: Viral RNA was negative. Repeating hep C antibody. 5. Leukocytosis: Secondary to sepsis. Follow CBCs. 6. Acute copd exacerbation start low dose prednisone and nebs    DVT prophylaxis: Lovenox Code Status: Full Family Communication: None at bedside. Disposition: TEE to determine duration of treatment,, will likely have to go back to SNF.   Consultants:  Infectious disease   Procedures:  None  Antimicrobials:  IV vancomycin and Zosyn    Subjective: Anxious,wheezing, tremors noted, denies cp,  Objective:  Vitals:   12/04/16 1349 12/05/16 0036 12/05/16 0557 12/05/16 1134  BP: 113/80 113/79 117/79 100/64  Pulse: 72 80 80 77  Resp: 18 18 18 20   Temp: 99 F (37.2 C) 98.6 F (37 C) 98.2 F (36.8 C) 98.4 F (36.9 C)  TempSrc:  Oral Oral Oral  SpO2: 99% 98% 95% 99%  Weight:      Height:        Examination:  General exam: Pleasant young female, moderately built and frail, lying comfortably propped up in bed. Does not look septic or toxic currently. Respiratory system: Slightly harsh breath sounds bilaterally but no wheezing, rhonchi or crackles appreciated. Respiratory effort normal. Cardiovascular system: S1 & S2 heard, RRR. No JVD, rubs, gallops or clicks. Short systolic murmur appreciated at apex. No pedal edema. Telemetry personally reviewed: Sinus rhythm. Gastrointestinal system: Abdomen is nondistended, soft and nontender. No organomegaly or masses felt. Normal bowel sounds heard. Central nervous system: Alert and oriented. No focal neurological deficits. Extremities: Symmetric 5 x 5 power. Skin: Multiple scars over upper and lower extremities. Also noted needle tracks on forearms. No acute lesions appreciated. Psychiatry: Judgement and insight appear  normal. Mood & affect appropriate.     Data Reviewed:  I have personally reviewed following labs and imaging studies  CBC:  Recent Labs Lab 12/03/16 1737 12/04/16 1128 12/05/16 0616  WBC 21.2* 16.9* 12.6*  NEUTROABS 18.4* 14.6* 9.9*  HGB 12.3 12.1 10.5*  HCT 36.8 36.9 33.0*  MCV 81.8 82.9 81.7  PLT 206 154 191   Basic Metabolic Panel:  Recent Labs Lab 12/03/16 1737 12/04/16 0845 12/05/16 0616  NA 131* 135 135  K 2.9* 3.5 4.1  CL 97* 101 103  CO2 23 22 22   GLUCOSE 133* 108* 108*  BUN 11 8 10   CREATININE 0.76 0.69 0.72  CALCIUM 8.3* 8.5* 8.5*  MG  --  1.7  --    Liver Function Tests:  Recent Labs Lab 12/03/16 1737  AST 22  ALT 12*  ALKPHOS 92  BILITOT 0.7  PROT 7.0  ALBUMIN 3.2*       Radiology Studies: Dg Chest 2 View  Result Date: 12/03/2016 CLINICAL DATA:  Acute onset of fever and lower back pain. Sepsis. Chills, generalized weakness, cough and congestion. Initial encounter. EXAM: CHEST  2 VIEW COMPARISON:  Chest radiograph performed 08/07/2016 FINDINGS: Scattered bilateral pulmonary nodules have redistributed since the prior study, concerning for new septic emboli. No definite pleural effusion or pneumothorax is seen. Left hilar prominence is of uncertain significance and may reflect underlying pneumonia. The heart is normal in size. No acute osseous abnormalities are identified. IMPRESSION: Scattered small bilateral pulmonary nodules have redistributed since the prior study, concerning for new bilateral septic emboli. Left hilar prominence is of uncertain significance and may reflect underlying pneumonia. Electronically Signed   By: Roanna RaiderJeffery  Chang M.D.   On: 12/03/2016 18:56        Scheduled Meds: . Chlorhexidine Gluconate Cloth  6 each Topical Q0600  . enoxaparin (LOVENOX) injection  40 mg Subcutaneous Q24H  . famotidine  20 mg Oral Daily  . ferrous sulfate  325 mg Oral BID WC  . folic acid  1 mg Oral Daily  . methocarbamol  750 mg Oral TID  . multivitamin with minerals  1 tablet Oral Daily  .  mupirocin ointment  1 application Nasal BID  . nicotine  21 mg Transdermal Daily  . thiamine  100 mg Oral Daily   Or  . thiamine  100 mg Intravenous Daily   Continuous Infusions: . sodium chloride    . vancomycin Stopped (12/05/16 0947)     LOS: 2 days     Richarda OverlieNayana Demarrius Guerrero, MD,  [facility] Pager [phone]  If 7PM-7AM, please contact night-coverage [email or web address] Password [email or web address] 12/05/2016, 11:44 AM '

## 2016-12-05 NOTE — Progress Notes (Signed)
Explained AMA paper Pt signed form. IV discontinued, pt refuse wheelchair to be transporter out.

## 2016-12-05 NOTE — Consult Note (Signed)
[facility] Consult   Reason for Consult:  Depression and capacity Referring Physician:  Dr. Allyson Sabal Patient Identification: Anna Colon MRN:  [id number] Principal Diagnosis: Acute septic pulmonary embolism without acute cor pulmonale [facility]) Diagnosis:   Patient Active Problem List   Diagnosis Date Noted  . Back pain [M54.9]   . Pain management [R52]   . FUO (fever of unknown origin) [R50.9]   . Polysubstance abuse [F19.10]   . Bursitis of right shoulder [M75.51]   . Pain [R52]   . Pyogenic arthritis of right shoulder region ([place]) [M00.9]   . Acute pain of right shoulder [M25.511]   . Septic embolism ([place]) [I26.90]   . Pressure injury of skin [L89.90] 07/14/2016  . Acute septic pulmonary embolism without acute cor pulmonale (HCC) [I26.90]   . Sepsis due to methicillin resistant Staphylococcus aureus (MRSA) ([place]) [A41.02]   . 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 Hepatitis C antibody test positive [R76.8] 07/08/2016  . Normocytic anemia [D64.9] 07/08/2016  . Thrombocytopenia ([place]) [D69.6] 07/08/2016  . Sepsis ([place]) [A41.9] 07/07/2016  . MRSA bacteremia [R78.81] 07/07/2016  . Cavitary pneumonia [J18.9, J98.4] 07/07/2016  . IVDU (intravenous drug user) [F19.90] 07/07/2016  . Cigarette smoker [F17.210] 07/07/2016    Total Time spent with patient: 1 hour  Subjective:   Anna Colon is a 36 y.o. female patient admitted with Left-sided pleuritic chest pain, fever and nonproductive cough  HPI:  Anna Colon is a 36 years old female admitted to [facility] with left-sided pleuritic chest pain, fever and nonproductive cough. Patient has a history of endocarditis from the IV drug use. Patient reported she likes to be discharged home when she cannot get her pain controlled with the medication she wants to. Patient reported the pain medication physician so providing is lack of bird feed. Patient reported she lives with her friends home [address] and  reportedly involved with polysubstance abuse especially IV heroin over 20 years. Patient reportedly takes 20/40 or 60 mg of heroin twice daily. 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 Patient stated she cannot stay in hospital he does not get the pain controlled well with Dilaudid. Patient reported she has no relationship with her children and no job, no family and no place of her own to live. Patient reported she would like to come back after taking more heroin if Doctors does not control her pain.  Medical history: 36 year old female with PMH of active IVDA ( last use was 2 days PTA), hospitalized 07/07/16- 08/08/16 for tricuspid valve endocarditis with MRSA bacteremia, cavitatory pneumonia from septic emboli, right shoulder septic arthritis, dental extractions for dental abscess, completed IV antibiotics via PICC line mostly in the hospital and remainder approximately 1 week at SNF, thoracentesis for pleural effusion, hepatitis C antibody positive but viral RNA negative , tobacco abuse, alcohol abuse, presented with fever of 103F, nonproductive cough, sharp left-sided pleuritic chest pain with radiation to back, nausea, vomiting and diaphoresis. 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 Infectious disease consulted  Past Psychiatric History: IVDA and alcohol abuse.  Risk to Self: Is patient at risk for suicide?: No Risk to Others:   Prior Inpatient Therapy:   Prior Outpatient Therapy:    Past Medical History:  Past Medical History:  Diagnosis Date  . Asthma   . Hepatitis C   . IVDU (intravenous drug user)     Past Surgical History:  Procedure Laterality Date  .  CESAREAN SECTION    . MULTIPLE EXTRACTIONS WITH ALVEOLOPLASTY N/A 07/25/2016   Procedure: MULTIPLE EXTRACTION OF TOOTH #'s 18,19 and 21 thru 31  WITH ALVEOLOPLASTY;  Surgeon: Lenn Cal, DDS;  Location: [facility];  Service: Oral Surgery;  Laterality: N/A;    Family History: History reviewed. No pertinent family history. Family Psychiatric  History: unknown Social History:  History  Alcohol Use No     History  Drug Use  . Types: IV, Cocaine    Comment: heroin    Social History   Social History  . Marital status: Divorced    Spouse name: N/A  . Number of children: N/A  . Years of education: N/A   Social History Main Topics  . Smoking status: Current Every Day Smoker    Packs/day: 1.00  . Smokeless tobacco: Never Used  . Alcohol use No  . Drug use: Yes    Types: IV, Cocaine     Comment: heroin  . Sexual activity: No   Other Topics Concern  . None   Social History Narrative  . None   Additional Social History:    Allergies:  No Known Allergies  Labs:  Results for orders placed or performed during the hospital encounter of 12/03/16 (from the past 48 hour(s))  Comprehensive metabolic panel     Status: Abnormal   Collection Time: 12/03/16  5:37 PM  Result Value Ref Range   Sodium 131 (L) 135 - 145 mmol/L   Potassium 2.9 (L) 3.5 - 5.1 mmol/L   Chloride 97 (L) 101 - 111 mmol/L   CO2 23 22 - 32 mmol/L   Glucose, Bld 133 (H) 65 - 99 mg/dL   BUN 11 6 - 20 mg/dL   Creatinine, Ser 0.76 0.44 - 1.00 mg/dL   Calcium 8.3 (L) 8.9 - 10.3 mg/dL   Total Protein 7.0 6.5 - 8.1 g/dL   Albumin 3.2 (L) 3.5 - 5.0 g/dL   AST 22 15 - 41 U/L   ALT 12 (L) 14 - 54 U/L   Alkaline Phosphatase 92 38 - 126 U/L   Total Bilirubin 0.7 0.3 - 1.2 mg/dL   GFR calc non Af Amer >60 >60 mL/min   GFR calc Af Amer >60 >60 mL/min    Comment: (NOTE) The eGFR has been calculated using the CKD EPI equation. This calculation has not been validated in all clinical situations. eGFR's persistently <60 mL/min signify possible Chronic Kidney Disease.    Anion gap 11 5 - 15  CBC with Differential     Status: Abnormal   Collection Time: 12/03/16  5:37 PM  Result Value Ref Range   WBC 21.2 (H) 4.0 - 10.5 K/uL   RBC 4.50 3.87 - 5.11 MIL/uL   Hemoglobin  12.3 12.0 - 15.0 g/dL   HCT 36.8 36.0 - 46.0 %   MCV 81.8 78.0 - 100.0 fL   MCH 27.3 26.0 - 34.0 pg   MCHC 33.4 30.0 - 36.0 g/dL   RDW 14.9 11.5 - 15.5 %   Platelets 206 150 - 400 K/uL   Neutrophils Relative % 82 %   Lymphocytes Relative 8 %   Monocytes Relative 5 %   Eosinophils Relative 0 %   Basophils Relative 0 %   Band Neutrophils 5 %   Neutro Abs 18.4 (H) 1.7 - 7.7 K/uL   Lymphs Abs 1.7 0.7 - 4.0 K/uL   Monocytes Absolute 1.1 (H) 0.1 - 1.0 K/uL   Eosinophils Absolute 0.0 0.0 - 0.7  K/uL   Basophils Absolute 0.0 0.0 - 0.1 K/uL  Urinalysis, Routine w reflex microscopic     Status: Abnormal   Collection Time: 12/03/16  5:37 PM  Result Value Ref Range   Color, Urine AMBER (A) YELLOW    Comment: BIOCHEMICALS MAY BE AFFECTED BY COLOR   APPearance CLOUDY (A) CLEAR   Specific Gravity, Urine 1.026 1.005 - 1.030   pH 6.0 5.0 - 8.0   Glucose, UA NEGATIVE NEGATIVE mg/dL   Hgb urine dipstick MODERATE (A) NEGATIVE   Bilirubin Urine SMALL (A) NEGATIVE   Ketones, ur 15 (A) NEGATIVE mg/dL   Protein, ur >300 (A) NEGATIVE mg/dL   Nitrite NEGATIVE NEGATIVE   Leukocytes, UA SMALL (A) NEGATIVE  Urinalysis, Microscopic (reflex)     Status: Abnormal   Collection Time: 12/03/16  5:37 PM  Result Value Ref Range   RBC / HPF 6-30 0 - 5 RBC/hpf   WBC, UA 6-30 0 - 5 WBC/hpf   Bacteria, UA FEW (A) NONE SEEN   Squamous Epithelial / LPF 6-30 (A) NONE SEEN   Urine-Other TRICHOMONAS PRESENT   hCG, quantitative, pregnancy     Status: None   Collection Time: 12/03/16  6:32 PM  Result Value Ref Range   hCG, Beta Chain, Quant, S 1 <5 mIU/mL    Comment: REPEATED TO VERIFY Corrected report called to [place] RN @ 2054 ON 12/03/2016 BY [place],G CORRECTED ON 06/03 AT 2049: PREVIOUSLY REPORTED AS <1050          GEST. AGE      CONC.  (mIU/mL)   <=1 WEEK        5   50     2 WEEKS       50   500     3 WEEKS       100   10,000     4 WEEKS     1,000   30,000     5 WEEKS     3,500   115,000   6 8 WEEKS       12,000   270,000    12 WEEKS     15,000   220,000        FEMALE AND NON PREGNANT FEMALE:     LESS THAN 5 mIU/mL   Culture, blood (routine x 2)     Status: Abnormal (Preliminary result)   Collection Time: 12/03/16  6:38 PM  Result Value Ref Range   Specimen Description BLOOD RIGHT ARM    Special Requests      IN PEDIATRIC BOTTLE Blood Culture adequate volume Performed at [facility], [address] [place], [place] [address]    Culture  Setup Time      GRAM POSITIVE COCCI IN CLUSTERS IN PEDIATRIC BOTTLE Organism ID to follow CRITICAL RESULT CALLED TO, READ BACK BY AND VERIFIED WITH: N. Batchelder Pharm.D. 14:35 12/04/16 ([email or web address])    Culture (A)     STAPHYLOCOCCUS AUREUS SUSCEPTIBILITIES TO FOLLOW    Report Status PENDING   Blood Culture ID Panel (Reflexed)     Status: Abnormal   Collection Time: 12/03/16  6:38 PM  Result Value Ref Range   Enterococcus species NOT DETECTED NOT DETECTED   Listeria monocytogenes NOT DETECTED NOT DETECTED   Staphylococcus species DETECTED (A) NOT DETECTED    Comment: CRITICAL RESULT CALLED TO, READ BACK BY AND VERIFIED WITH: N. Batchelder Pharm.D. 14:35 12/04/16 ([email or web address])    Staphylococcus aureus DETECTED (A) NOT DETECTED  Comment: Methicillin (oxacillin)-resistant Staphylococcus aureus (MRSA). MRSA is predictably resistant to beta-lactam antibiotics (except ceftaroline). Preferred therapy is vancomycin unless clinically contraindicated. Patient requires contact precautions if  hospitalized. CRITICAL RESULT CALLED TO, READ BACK BY AND VERIFIED WITH: N. Batchelder Pharm.D. 14:35 12/04/16 (wilsonm)    Methicillin resistance DETECTED (A) NOT DETECTED    Comment: CRITICAL RESULT CALLED TO, READ BACK BY AND VERIFIED WITH: N. Batchelder Pharm.D. 14:35 12/04/16 (wilsonm)    Streptococcus species NOT DETECTED NOT DETECTED   Streptococcus agalactiae NOT DETECTED NOT DETECTED   Streptococcus pneumoniae NOT DETECTED NOT DETECTED   Streptococcus  pyogenes NOT DETECTED NOT DETECTED   Acinetobacter baumannii NOT DETECTED NOT DETECTED   Enterobacteriaceae species NOT DETECTED NOT DETECTED   Enterobacter cloacae complex NOT DETECTED NOT DETECTED   Escherichia coli NOT DETECTED NOT DETECTED   Klebsiella oxytoca NOT DETECTED NOT DETECTED   Klebsiella pneumoniae NOT DETECTED NOT DETECTED   Proteus species NOT DETECTED NOT DETECTED   Serratia marcescens NOT DETECTED NOT DETECTED   Haemophilus influenzae NOT DETECTED NOT DETECTED   Neisseria meningitidis NOT DETECTED NOT DETECTED   Pseudomonas aeruginosa NOT DETECTED NOT DETECTED   Candida albicans NOT DETECTED NOT DETECTED   Candida glabrata NOT DETECTED NOT DETECTED   Candida krusei NOT DETECTED NOT DETECTED   Candida parapsilosis NOT DETECTED NOT DETECTED   Candida tropicalis NOT DETECTED NOT DETECTED  I-Stat CG4 Lactic Acid, ED     Status: Abnormal   Collection Time: 12/03/16  6:43 PM  Result Value Ref Range   Lactic Acid, Venous 0.45 (L) 0.5 - 1.9 mmol/L  Culture, blood (routine x 2)     Status: Abnormal (Preliminary result)   Collection Time: 12/03/16  9:20 PM  Result Value Ref Range   Specimen Description BLOOD RIGHT ARM    Special Requests      BOTTLES DRAWN AEROBIC AND ANAEROBIC Blood Culture adequate volume Performed at [facility], [address] [place], [place] [address]    Culture  Setup Time      GRAM POSITIVE COCCI IN CLUSTERS IN BOTH AEROBIC AND ANAEROBIC BOTTLES CRITICAL VALUE NOTED.  VALUE IS CONSISTENT WITH PREVIOUSLY REPORTED AND CALLED VALUE.    Culture (A)     STAPHYLOCOCCUS AUREUS SUSCEPTIBILITIES TO FOLLOW    Report Status PENDING   Magnesium     Status: None   Collection Time: 12/04/16  8:45 AM  Result Value Ref Range   Magnesium 1.7 1.7 - 2.4 mg/dL  Basic metabolic panel     Status: Abnormal   Collection Time: 12/04/16  8:45 AM  Result Value Ref Range   Sodium 135 135 - 145 mmol/L   Potassium 3.5 3.5 - 5.1 mmol/L   Chloride 101  101 - 111 mmol/L   CO2 22 22 - 32 mmol/L   Glucose, Bld 108 (H) 65 - 99 mg/dL   BUN 8 6 - 20 mg/dL   Creatinine, Ser 0.37 0.44 - 1.00 mg/dL   Calcium 8.5 (L) 8.9 - 10.3 mg/dL   GFR calc non Af Amer >60 >60 mL/min   GFR calc Af Amer >60 >60 mL/min    Comment: (NOTE) The eGFR has been calculated using the CKD EPI equation. This calculation has not been validated in all clinical situations. eGFR's persistently <60 mL/min signify possible Chronic Kidney Disease.    Anion gap 12 5 - 15  HIV antibody     Status: None   Collection Time: 12/04/16 11:28 AM  Result Value Ref  Range   HIV Screen 4th Generation wRfx Non Reactive Non Reactive    Comment: (NOTE) Performed At: [facility] [place], [place] [id number] Lindon Romp MD KG:[id number]   Hepatitis C antibody     Status: Abnormal   Collection Time: 12/04/16 11:28 AM  Result Value Ref Range   HCV Ab >11.0 (H) 0.0 - 0.9 s/co ratio    Comment: (NOTE)                                  Negative:     < 0.8                             Indeterminate: 0.8 - 0.9                                  Positive:     > 0.9 The CDC recommends that a positive HCV antibody result be followed up with a HCV Nucleic Acid Amplification test ([id number]). Performed At: [facility] [place], [place] [id number] Lindon Romp MD TG:[id number]   CBC with Differential/Platelet     Status: Abnormal   Collection Time: 12/04/16 11:28 AM  Result Value Ref Range   WBC 16.9 (H) 4.0 - 10.5 K/uL   RBC 4.45 3.87 - 5.11 MIL/uL   Hemoglobin 12.1 12.0 - 15.0 g/dL   HCT 36.9 36.0 - 46.0 %   MCV 82.9 78.0 - 100.0 fL   MCH 27.2 26.0 - 34.0 pg   MCHC 32.8 30.0 - 36.0 g/dL   RDW 15.0 11.5 - 15.5 %   Platelets 154 150 - 400 K/uL   Neutrophils Relative % 86 %   Neutro Abs 14.6 (H) 1.7 - 7.7 K/uL   Lymphocytes Relative 9 %   Lymphs Abs 1.5 0.7 - 4.0 K/uL   Monocytes Relative 5 %   Monocytes Absolute 0.8 0.1 - 1.0 K/uL    Eosinophils Relative 0 %   Eosinophils Absolute 0.0 0.0 - 0.7 K/uL   Basophils Relative 0 %   Basophils Absolute 0.0 0.0 - 0.1 K/uL  MRSA PCR Screening     Status: Abnormal   Collection Time: 12/04/16  2:15 PM  Result Value Ref Range   MRSA by PCR POSITIVE (A) NEGATIVE    Comment:        The GeneXpert MRSA Assay (FDA approved for NASAL specimens only), is one component of a comprehensive MRSA colonization surveillance program. It is not intended to diagnose MRSA infection nor to guide or monitor treatment for MRSA infections. RESULT CALLED TO, READ BACK BY AND VERIFIED WITH: I [place] RN 1959 12/04/16 A BROWNING   CBC with Differential/Platelet     Status: Abnormal   Collection Time: 12/05/16  6:16 AM  Result Value Ref Range   WBC 12.6 (H) 4.0 - 10.5 K/uL   RBC 4.04 3.87 - 5.11 MIL/uL   Hemoglobin 10.5 (L) 12.0 - 15.0 g/dL   HCT 33.0 (L) 36.0 - 46.0 %   MCV 81.7 78.0 - 100.0 fL   MCH 26.0 26.0 - 34.0 pg   MCHC 31.8 30.0 - 36.0 g/dL   RDW 14.5 11.5 - 15.5 %   Platelets 191 150 - 400 K/uL   Neutrophils Relative % 79 %   Neutro Abs 9.9 (  H) 1.7 - 7.7 K/uL   Lymphocytes Relative 13 %   Lymphs Abs 1.6 0.7 - 4.0 K/uL   Monocytes Relative 7 %   Monocytes Absolute 0.9 0.1 - 1.0 K/uL   Eosinophils Relative 1 %   Eosinophils Absolute 0.1 0.0 - 0.7 K/uL   Basophils Relative 0 %   Basophils Absolute 0.0 0.0 - 0.1 K/uL  Basic metabolic panel     Status: Abnormal   Collection Time: 12/05/16  6:16 AM  Result Value Ref Range   Sodium 135 135 - 145 mmol/L   Potassium 4.1 3.5 - 5.1 mmol/L    Comment: SLIGHT HEMOLYSIS   Chloride 103 101 - 111 mmol/L   CO2 22 22 - 32 mmol/L   Glucose, Bld 108 (H) 65 - 99 mg/dL   BUN 10 6 - 20 mg/dL   Creatinine, Ser 0.72 0.44 - 1.00 mg/dL   Calcium 8.5 (L) 8.9 - 10.3 mg/dL   GFR calc non Af Amer >60 >60 mL/min   GFR calc Af Amer >60 >60 mL/min    Comment: (NOTE) The eGFR has been calculated using the CKD EPI equation. This calculation has not  been validated in all clinical situations. eGFR's persistently <60 mL/min signify possible Chronic Kidney Disease.    Anion gap 10 5 - 15    Current Facility-Administered Medications  Medication Dose Route Frequency Provider Last Rate Last Dose  . acetaminophen (TYLENOL) tablet 650 mg  650 mg Oral Q6H PRN Etta Quill, DO   650 mg at 12/04/16 0608  . Chlorhexidine Gluconate Cloth 2 % PADS 6 each  6 each Topical Q0600 Modena Jansky, MD   6 each at 12/05/16 0600  . enoxaparin (LOVENOX) injection 40 mg  40 mg Subcutaneous Q24H Jennette Kettle M, DO   40 mg at 12/05/16 1309  . famotidine (PEPCID) tablet 20 mg  20 mg Oral Daily Jennette Kettle M, DO   20 mg at 12/05/16 1019  . ferrous sulfate tablet 325 mg  325 mg Oral BID WC Jennette Kettle M, DO   325 mg at 12/05/16 0847  . folic acid (FOLVITE) tablet 1 mg  1 mg Oral Daily Hongalgi, Anand D, MD   1 mg at 12/05/16 1020  . hydrOXYzine (ATARAX/VISTARIL) tablet 50 mg  50 mg Oral TID PRN Reyne Dumas, MD   50 mg at 12/05/16 1309  . ketorolac (TORADOL) 15 MG/ML injection 7.5 mg  7.5 mg Intravenous Q6H PRN Modena Jansky, MD   7.5 mg at 12/05/16 0845  . levalbuterol (XOPENEX) nebulizer solution 1.25 mg  1.25 mg Nebulization Q6H PRN Reyne Dumas, MD      . LORazepam (ATIVAN) tablet 1 mg  1 mg Oral Q4H PRN Reyne Dumas, MD       Or  . LORazepam (ATIVAN) injection 1 mg  1 mg Intravenous Q4H PRN Reyne Dumas, MD   1 mg at 12/05/16 1308  . methocarbamol (ROBAXIN) tablet 750 mg  750 mg Oral TID Etta Quill, DO   750 mg at 12/05/16 1019  . multivitamin with minerals tablet 1 tablet  1 tablet Oral Daily Modena Jansky, MD   1 tablet at 12/05/16 1020  . mupirocin ointment (BACTROBAN) 2 % 1 application  1 application Nasal BID Modena Jansky, MD   1 application at 31/54/00 1020  . nicotine (NICODERM CQ - dosed in mg/24 hours) patch 21 mg  21 mg Transdermal Daily Etta Quill, DO      .  ondansetron (ZOFRAN) injection 4 mg  4 mg  Intravenous Q6H PRN Opyd, Ilene Qua, MD   4 mg at 12/04/16 0144  . oxyCODONE (Oxy IR/ROXICODONE) immediate release tablet 10 mg  10 mg Oral Q6H PRN Modena Jansky, MD   10 mg at 12/05/16 1318  . predniSONE (DELTASONE) tablet 40 mg  40 mg Oral Q breakfast Abrol, Nayana, MD      . sodium chloride 0.9 % bolus 1,000 mL  1,000 mL Intravenous Once Tegeler, Gwenyth Allegra, MD      . thiamine (VITAMIN B-1) tablet 100 mg  100 mg Oral Daily Hongalgi, Anand D, MD   100 mg at 12/05/16 1019   Or  . thiamine (B-1) injection 100 mg  100 mg Intravenous Daily Hongalgi, Anand D, MD      . vancomycin (VANCOCIN) IVPB 750 mg/150 ml premix  750 mg Intravenous Q12H Tegeler, Gwenyth Allegra, MD   Stopped at 12/05/16 [phone]    Musculoskeletal: Strength & Muscle Tone: decreased Gait & Station: unable to stand Patient leans: N/A  Psychiatric Specialty Exam: Physical Exam as per history and physical   ROS complaining about pleuritic chest pain, restlessness, anxiety, opioid withdrawal. Patient has given Percocet while in the hospital. She has when necessary opiates on board. No Fever-chills, No Headache, No changes with Vision or hearing, reports vertigo No problems swallowing food or Liquids, No Chest pain, Cough or Shortness of Breath, No Abdominal pain, No Nausea or Vommitting, Bowel movements are regular, No Blood in stool or Urine, No dysuria, No new skin rashes or bruises, No new joints pains-aches,  No new weakness, tingling, numbness in any extremity, No recent weight gain or loss, No polyuria, polydypsia or polyphagia,  A full 10 point Review of Systems was done, except as stated above, all other Review of Systems were negative.  Blood pressure 100/64, pulse 77, temperature 98.4 F (36.9 C), temperature source Oral, resp. rate 20, height '5\' 2"'$  (1.575 m), weight 55.1 kg (121 lb 6.4 oz), last menstrual period 11/02/2016, SpO2 99 %.Body mass index is 22.2 kg/m.  General Appearance: Guarded  Eye Contact:   Good  Speech:  Clear and Coherent  Volume:  Decreased  Mood:  Anxious, Depressed, Dysphoric and Worthless  Affect:  Labile and Tearful  Thought Process:  Coherent and Goal Directed  Orientation:  Full (Time, Place, and Person)  Thought Content:  WDL and Rumination  Suicidal Thoughts:  No  Homicidal Thoughts:  No  Memory:  Immediate;   Good Recent;   Fair Remote;   Fair  Judgement:  Impaired  Insight:  Fair  Psychomotor Activity:  Restlessness  Concentration:  Concentration: Good and Attention Span: Fair  Recall:  Good  Fund of Knowledge:  Good  Language:  Good  Akathisia:  Negative  Handed:  Right  AIMS (if indicated):     Assets:  Communication Skills Desire for Improvement Leisure Time Resilience Social Support  ADL's:  Impaired  Cognition:  WNL  Sleep:        Treatment Plan Summary: 36 years old female with the history of polysubstance abuse, intravenous drug abuser presented with the pleuritic chest pain and fever. Patient is seeking higher pain control with thev opioid medication the hospital. Patient does not appear to be withdrawing from opioids and she has been in hospital for 72 hours receiving antibiotics.  Patient meets criteria for capacity to make her own medical decisions based on my evaluation and at the same time patient is well-known to  make poor decisions especially assisted with polysubstance abuse.  Recommended no psychotropic medication during my visit.  Daily contact with patient to assess and evaluate symptoms and progress in treatment and Medication management  Disposition: Supportive therapy provided about ongoing stressors.  Ambrose Finland, MD 12/05/2016 1:50 PM

## 2016-12-05 NOTE — Progress Notes (Signed)
[facility]    Date of Admission:  12/03/2016    Total days of antibiotics 3                                                                                     Day 3 Vancomycin    ID: Anna Colon is a 36 y.o. female with MRSA bacteremia, septic pulmonary emboli/PNA. 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    Principal Problem:   Acute septic pulmonary embolism without acute cor pulmonale (HCC) Active Problems:   Sepsis (HCC)   IVDU (intravenous drug user)   Polysubstance abuse    Subjective: Still feeling poorly overall. Chest pain unchanged.   Medications:  . Chlorhexidine Gluconate Cloth  6 each Topical Q0600  . enoxaparin (LOVENOX) injection  40 mg Subcutaneous Q24H  . famotidine  20 mg Oral Daily  . ferrous sulfate  325 mg Oral BID WC  . folic acid  1 mg Oral Daily  . methocarbamol  750 mg Oral TID  . multivitamin with minerals  1 tablet Oral Daily  . mupirocin ointment  1 application Nasal BID  . nicotine  21 mg Transdermal Daily  . thiamine  100 mg Oral Daily   Or  . thiamine  100 mg Intravenous Daily    Objective: Vital signs in last 24 hours: Temp:  [98.2 F (36.8 C)-99 F (37.2 C)] 98.2 F (36.8 C) (06/05 0557) Pulse Rate:  [72-80] 80 (06/05 0557) Resp:  [18] 18 (06/05 0557) BP: (110-117)/(73-80) 117/79 (06/05 0557) SpO2:  [95 %-99 %] 95 % (06/05 0557)  General appearance: Asleep in bed, no distress. Easily agitated/anxious.  Neck: no adenopathy and no JVD Resp: clear to auscultation bilaterally anteriorly, diminished bibasilar  Cardio: S1, S2 normal and + 1/2 SEM to left sternal boarder GI: soft, non-tender; bowel sounds normal; no masses,  no organomegaly Extremities: extremities normal, atraumatic, no cyanosis or edema Pulses: 2+ and symmetric Skin: Skin color, texture, turgor normal. No rashes or lesions or hypopigmented areas noted to face Neurologic: Alert and oriented X 3  Lab  Results  Recent Labs  12/04/16 0845 12/04/16 1128 12/05/16 0616  WBC  --  16.9* 12.6*  HGB  --  12.1 10.5*  HCT  --  36.9 33.0*  NA 135  --  135  K 3.5  --  4.1  CL 101  --  103  CO2 22  --  22  BUN 8  --  10  CREATININE 0.69  --  0.72   Liver Panel  Recent Labs  12/03/16 1737  PROT 7.0  ALBUMIN 3.2*  AST 22  ALT 12*  ALKPHOS 92  BILITOT 0.7   Sedimentation Rate No results for input(s): ESRSEDRATE in the last 72 hours. C-Reactive Protein No results for input(s): CRP in the last 72 hours.  Microbiology: - BCx 6/3 >> MRSA 2/2 sets  - Repeat BCx 6/5 >> pending  Studies/Results: Dg Chest 2 View  Result Date: 12/03/2016 CLINICAL DATA:  Acute onset of fever and lower back pain. Sepsis. Chills, generalized weakness, cough and congestion. Initial encounter. EXAM: CHEST  2 VIEW COMPARISON:  Chest radiograph performed 08/07/2016 FINDINGS: Scattered bilateral pulmonary nodules have redistributed since the prior study, concerning for new septic emboli. No definite pleural effusion or pneumothorax is seen. Left hilar prominence is of uncertain significance and may reflect underlying pneumonia. The heart is normal in size. No acute osseous abnormalities are identified. IMPRESSION: Scattered small bilateral pulmonary nodules have redistributed since the prior study, concerning for new bilateral septic emboli. Left hilar prominence is of uncertain significance and may reflect underlying pneumonia. Electronically Signed   By: Roanna RaiderJeffery  Chang M.D.   On: 12/03/2016 18:56        [facility] Staphylococcus aureus bacteremia   Staphylococcus aureus bacteremia (SAB) is associated with a high rate of complications and mortality.  Specific aspects of clinical management are critical to optimizing the outcome of patients with SAB.  Therefore, the [facility] Management Team [facility]) has initiated an intervention aimed at improving the management of SAB at  [facility].  To do so, Infectious Diseases physicians are providing an evidence-based consult for the management of all patients with SAB.     Yes No Comments  Perform follow-up blood cultures (even if the patient is afebrile) to ensure clearance of bacteremia [x]  []  6/5 pending  Remove vascular catheter and obtain follow-up blood cultures after the removal of the catheter [x]  []  Not applicable   Perform echocardiography to evaluate for endocarditis (transthoracic ECHO is 40-50% sensitive, TEE is > 90% sensitive) []  []  TTE ordered  +TTE January 2018 with large veg on TV  Consult electrophysiologist to evaluate implanted cardiac device (pacemaker, ICD) []  [x]  Not applicable   Ensure source control []  []  TTE pending   Investigate for "metastatic" sites of infection [x]  []  No  Change antibiotic therapy to Vancomycin for BCID ID'd MRSA  [x]  []  Goal trough should be 15 - 20 mcg/mL  Estimated duration of IV antibiotic therapy:   [x]  []  SW consulted. Active IVDU so will not need home health services as she is too high risk for OD.     Assessment/Plan: MRSA Bacteremia =  - BCx 6/3 >> MRSA 2/2 - Repeat BCx 6/5 >> pending - Continue Vancomycin while awaiting blood culture clearance prior to PICC placement - TTE ordered - hopefully will be done today to confirm IE and valve integrity   Chest Pain =  - CXR showing scattered b/l areas of pulmonary nodules concerning for septic emboli with left hilar prominence concerning for possible PNA  IVDU/SA =  - HIV neg - Completed her IV treatment at SNF last time, however she tells me "she is not going back there again." SW has been consulted for assistance - SW consulted for placement assistance should she remain in the hospital (reported to her nurse today that she was wanting to leave d/t her opioid withdrawal).    Rexene AlbertsStephanie Dixon, MSN, NP-C [facility] Cell: [phone] Pager:  [phone]  12/05/2016, 9:28 AM

## 2016-12-05 NOTE — Progress Notes (Signed)
Offered Pt assistance with bath. Pt stated no, she did not want to get a bath today

## 2016-12-05 NOTE — Progress Notes (Signed)
1400 Pt stating she wants to leave, pt appears very anxious. Gave IV ativan, hydroxine, and oxycodone MD notified.   1445 pt walking halls not steady stating she wants to leave now. MD notified, MD okay for pt to leave AMA.

## 2016-12-05 NOTE — Discharge Summary (Signed)
Anna NOTE   Symphony Colon  ZOX:[id number]    DOB: 1981/05/13    DOA: 12/03/2016  PCP: Patient, No Pcp Per    ADDENDUM Patient stated she cannot stay in hospital he does not get the pain controlled well with Dilaudid Patient reported she would like to come back after taking more heroin if Doctors does not control her pain.  Patient meets criteria for capacity to make her own medical decisions based on my evaluation and at the same time patient is well-known to make poor decisions especially assisted with polysubstance abuse Patient left AMA at 3:10 pm after fully understanding the risk of sepsis and death ,that could results from her leaving AMA  Brief Narrative:   36 year old female with PMH of active IVDA ( last use was 2 days PTA), hospitalized 07/07/16- 08/08/16 for tricuspid valve endocarditis with MRSA bacteremia, cavitatory pneumonia from septic emboli, right shoulder septic arthritis, dental extractions for dental abscess, completed IV antibiotics via PICC line mostly in the hospital and remainder approximately 1 week at SNF, thoracentesis for pleural effusion, hepatitis C antibody positive but viral RNA negative , tobacco abuse, alcohol abuse, presented with fever of 103F, nonproductive cough, sharp left-sided pleuritic chest pain with radiation to back, nausea, vomiting and diaphoresis. 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 Infectious disease consulted.   Assessment & Plan:   Principal Problem:   Acute septic pulmonary embolism without acute cor pulmonale (HCC) Active Problems:   Sepsis (HCC)   IVDU (intravenous drug user)   Polysubstance abuse   1. 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 Past history as indicated above. Chest x-ray personally reviewed and suspicious for new bilateral septic emboli. WBC 21.2. Blood cultures 2: Pending. Started empirically on IV vancomycin and Zosyn, continue. Infectious disease  consulted. 2-D echo 07/08/16 had shown a large vegetation (10 x 11 mm) on the anterior leaflet of right cuspid valve. Patient does not remember having TEE done. TTE pending, will arrange for TEE. Continue vancomycin while awaiting blood culture clearance prior to PICC line. Duration to be specified by infectious disease. Leukocytosis improving 2. Hypokalemia: Better. Continue to replace and follow BMP. Magnesium: 1.7. 3. Polysubstance abuse (tobacco, alcohol, IVDA-heroin, cocaine): Cessation counseled. Continue nicotine patch. CIWA protocol.? Opiate addiction-on when necessary oxycodone. 4. Hepatitis C antibody positive: Viral RNA was negative. Repeating hep C antibody. 5. Leukocytosis: Secondary to sepsis. Follow CBCs. 6. Acute copd exacerbation start low dose prednisone and nebs    DVT prophylaxis: Lovenox Code Status: Full Family Communication: None at bedside. Disposition: TEE to determine duration of treatment,, will likely have to go back to SNF.   Consultants:  Infectious disease   Procedures:  None  Antimicrobials:  IV vancomycin and Zosyn    Subjective: Anxious,wheezing, tremors noted, denies cp,  Objective:  Vitals:   12/04/16 1349 12/05/16 0036 12/05/16 0557 12/05/16 1134  BP: 113/80 113/79 117/79 100/64  Pulse: 72 80 80 77  Resp: 18 18 18 20   Temp: 99 F (37.2 C) 98.6 F (37 C) 98.2 F (36.8 C) 98.4 F (36.9 C)  TempSrc:  Oral Oral Oral  SpO2: 99% 98% 95% 99%  Weight:      Height:        Examination:  General exam: Pleasant young female, moderately built and frail, lying comfortably propped up in bed. Does not look septic or toxic currently. Respiratory system: Slightly harsh breath sounds bilaterally but no wheezing, rhonchi or crackles appreciated. Respiratory effort normal. Cardiovascular system:  S1 & S2 heard, RRR. No JVD, rubs, gallops or clicks. Short systolic murmur appreciated at apex. No pedal edema. Telemetry personally reviewed: Sinus  rhythm. Gastrointestinal system: Abdomen is nondistended, soft and nontender. No organomegaly or masses felt. Normal bowel sounds heard. Central nervous system: Alert and oriented. No focal neurological deficits. Extremities: Symmetric 5 x 5 power. Skin: Multiple scars over upper and lower extremities. Also noted needle tracks on forearms. No acute lesions appreciated. Psychiatry: Judgement and insight appear normal. Mood & affect appropriate.     Data Reviewed: I have personally reviewed following labs and imaging studies  CBC:  Recent Labs Lab 12/03/16 1737 12/04/16 1128 12/05/16 0616  WBC 21.2* 16.9* 12.6*  NEUTROABS 18.4* 14.6* 9.9*  HGB 12.3 12.1 10.5*  HCT 36.8 36.9 33.0*  MCV 81.8 82.9 81.7  PLT 206 154 191   Basic Metabolic Panel:  Recent Labs Lab 12/03/16 1737 12/04/16 0845 12/05/16 0616  NA 131* 135 135  K 2.9* 3.5 4.1  CL 97* 101 103  CO2 23 22 22   GLUCOSE 133* 108* 108*  BUN 11 8 10   CREATININE 0.76 0.69 0.72  CALCIUM 8.3* 8.5* 8.5*  MG  --  1.7  --    Liver Function Tests:  Recent Labs Lab 12/03/16 1737  AST 22  ALT 12*  ALKPHOS 92  BILITOT 0.7  PROT 7.0  ALBUMIN 3.2*       Radiology Studies: Dg Chest 2 View  Result Date: 12/03/2016 CLINICAL DATA:  Acute onset of fever and lower back pain. Sepsis. Chills, generalized weakness, cough and congestion. Initial encounter. EXAM: CHEST  2 VIEW COMPARISON:  Chest radiograph performed 08/07/2016 FINDINGS: Scattered bilateral pulmonary nodules have redistributed since the prior study, concerning for new septic emboli. No definite pleural effusion or pneumothorax is seen. Left hilar prominence is of uncertain significance and may reflect underlying pneumonia. The heart is normal in size. No acute osseous abnormalities are identified. IMPRESSION: Scattered small bilateral pulmonary nodules have redistributed since the prior study, concerning for new bilateral septic emboli. Left hilar prominence is of  uncertain significance and may reflect underlying pneumonia. Electronically Signed   By: Roanna RaiderJeffery  Chang M.D.   On: 12/03/2016 18:56        Scheduled Meds: . Chlorhexidine Gluconate Cloth  6 each Topical Q0600  . enoxaparin (LOVENOX) injection  40 mg Subcutaneous Q24H  . famotidine  20 mg Oral Daily  . ferrous sulfate  325 mg Oral BID WC  . folic acid  1 mg Oral Daily  . methocarbamol  750 mg Oral TID  . multivitamin with minerals  1 tablet Oral Daily  . mupirocin ointment  1 application Nasal BID  . nicotine  21 mg Transdermal Daily  . predniSONE  40 mg Oral Q breakfast  . thiamine  100 mg Oral Daily   Or  . thiamine  100 mg Intravenous Daily   Continuous Infusions: . sodium chloride    . vancomycin Stopped (12/05/16 0947)     LOS: 2 days     Richarda OverlieNayana Keshan Reha, MD,  [facility] Pager [phone]  If 7PM-7AM, please contact night-coverage [email or web address] Password [email or web address] 12/05/2016, 4:46 PM '

## 2016-12-06 LAB — CULTURE, BLOOD (ROUTINE X 2)
SPECIAL REQUESTS: ADEQUATE
Special Requests: ADEQUATE

## 2016-12-06 LAB — HCV RNA QUANT RFLX ULTRA OR GENOTYP
HCV RNA QNT(LOG COPY/ML): UNDETERMINED {Log_IU}/mL
HEPATITIS C QUANTITATION: NOT DETECTED [IU]/mL

## 2016-12-06 SURGERY — ECHOCARDIOGRAM, TRANSESOPHAGEAL
Anesthesia: Monitor Anesthesia Care

## 2016-12-10 LAB — CULTURE, BLOOD (ROUTINE X 2)
CULTURE: NO GROWTH
Culture: NO GROWTH
SPECIAL REQUESTS: ADEQUATE
Special Requests: ADEQUATE

## 2016-12-14 ENCOUNTER — Telehealth: Payer: Self-pay | Admitting: *Deleted

## 2016-12-14 NOTE — Telephone Encounter (Signed)
Patient's father, Tinnie GensJeffrey called stating he was concerned for his daughter, because [facility], [place] [facility] [place] He told me that she has a life threatening condition and she has actually had this condition in the past. He stated that she is not getting the medicine that she needs at the jail. I spoke to Dr. Staci Righterobert Comer and he said the patient should be transported to the ED for evaluation and treatment. I called [place] [facility] and spoke with Mr. Jethro Polingorwood, NP and he said that they were given completely different information and that he would arrange transport for the patient. Because the father is aware that his daughter may be brought to the hospital, he is not sure exactly which ED the patient will be transported to. This is for the safety of his officers.

## 2016-12-18 ENCOUNTER — Telehealth: Payer: Self-pay | Admitting: *Deleted

## 2016-12-18 NOTE — Telephone Encounter (Signed)
Patient's father called to see if [facility] He states he "only wants to know if she is alive or if she is dying alone and no one can call him to tell him about it." RN unable to give any information. Father's contact information updated with a new phone number. Andree CossHowell, Arnetta Odeh M, RN

## 2017-02-22 NOTE — Addendum Note (Signed)
Addendum  created 02/22/17 1016 by Bradie Sangiovanni, MD   Sign clinical note    

## 2017-06-22 IMAGING — MR MR SHOULDER*R* WO/W CM
6 of 11 series · 19 of 40 positions shown · IV contrast (multihance)
Comparison: MRI 07/30/2016

CLINICAL DATA: Followup complex subacromial/ subdeltoid bursitis.

EXAM:
MRI OF THE RIGHT SHOULDER WITHOUT AND WITH CONTRAST
TECHNIQUE: Multiplanar, multisequence MR imaging of the right shoulder was
performed before and after the administration of intravenous
contrast.
CONTRAST:  10mL MULTIHANCE GADOBENATE DIMEGLUMINE 529 MG/ML IV SOLN

[Series 3: T2 fat-sat · sagittal · 4.0mm · 0.27mm/px · 4 of 18 slices shown (1 of 3)]
[im 1/18]
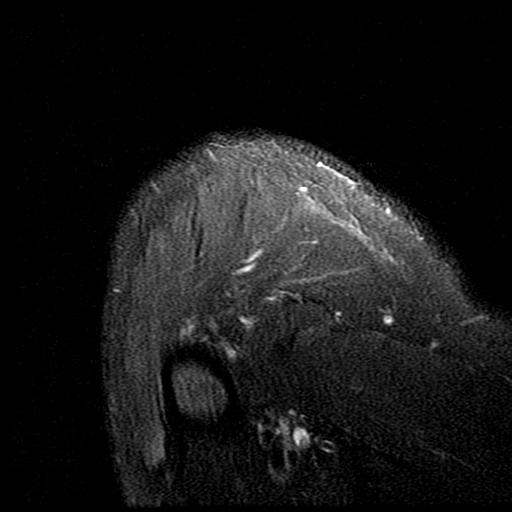
[im 6/18]
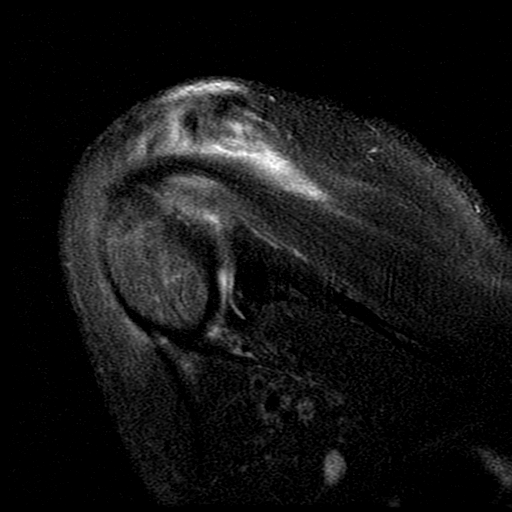
[im 12/18]
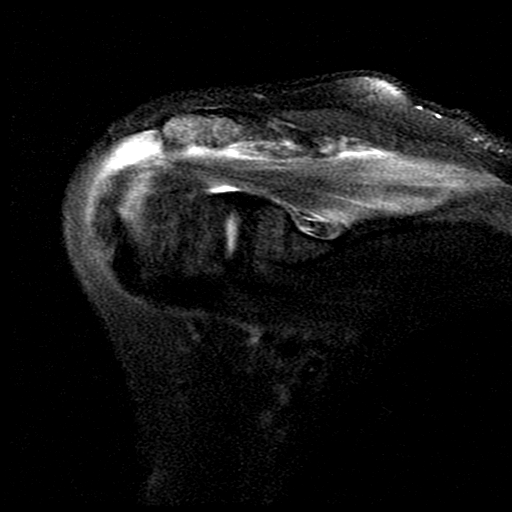
[im 18/18]
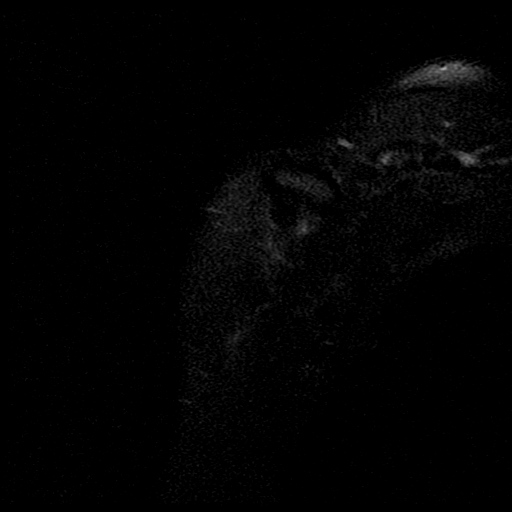

[Series 4: PD fat-sat · sagittal · 4.0mm · 0.27mm/px · 4 of 18 slices shown]
[im 1/18]
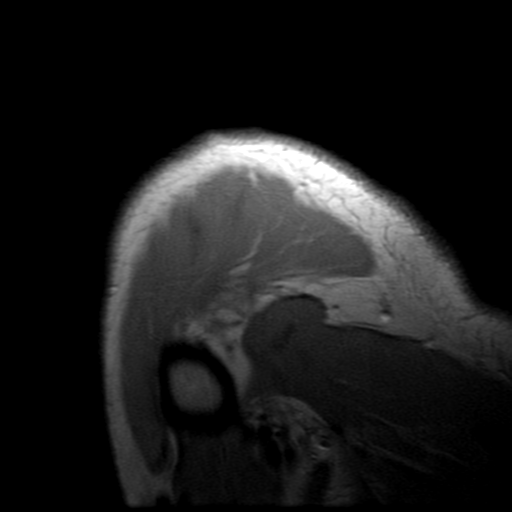
[im 6/18]
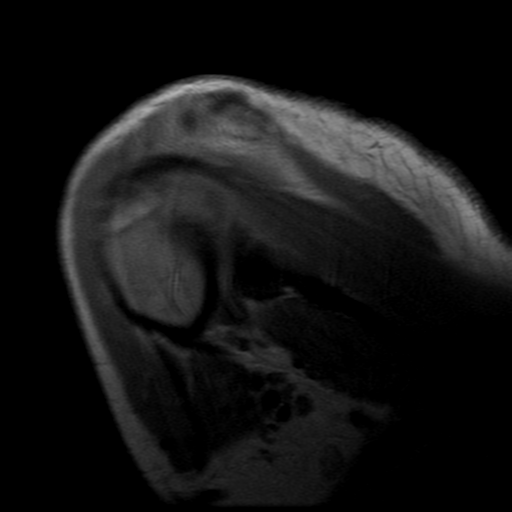
[im 12/18]
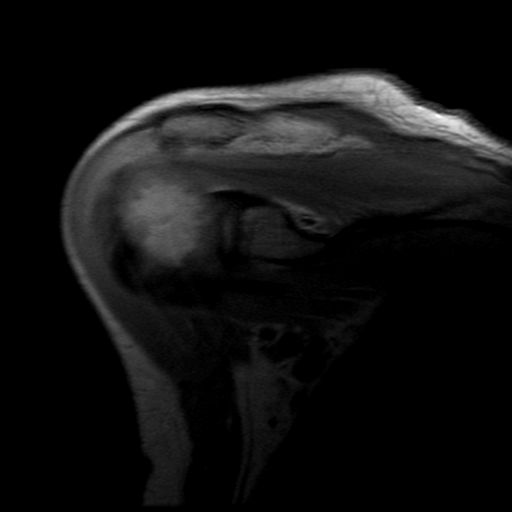
[im 18/18]
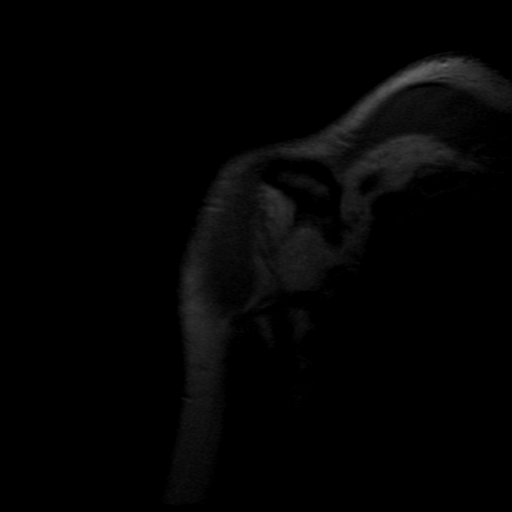

[Series 8: T2 fat-sat · coronal · 4.0mm · 0.27mm/px · 3 of 18 slices shown (2 of 3)]
[im 1/18]
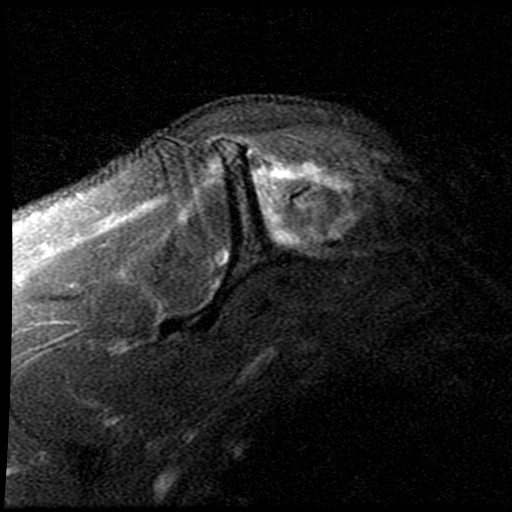
[im 9/18]
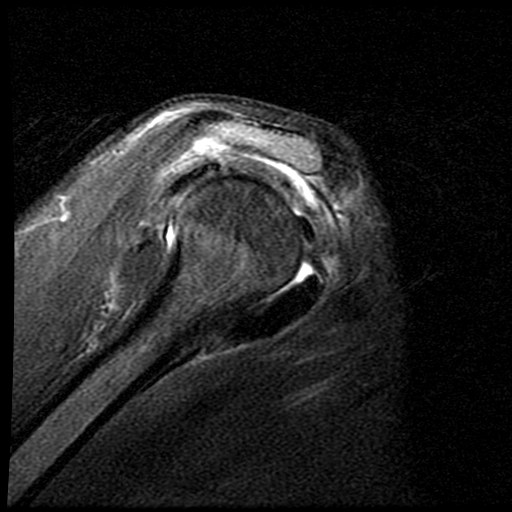
[im 18/18]
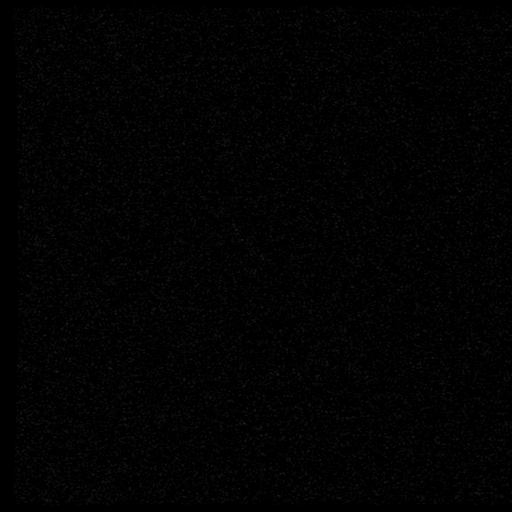

[Series 9: T1 · coronal · 4.0mm · 0.27mm/px · 3 of 18 slices shown]
[im 1/18]
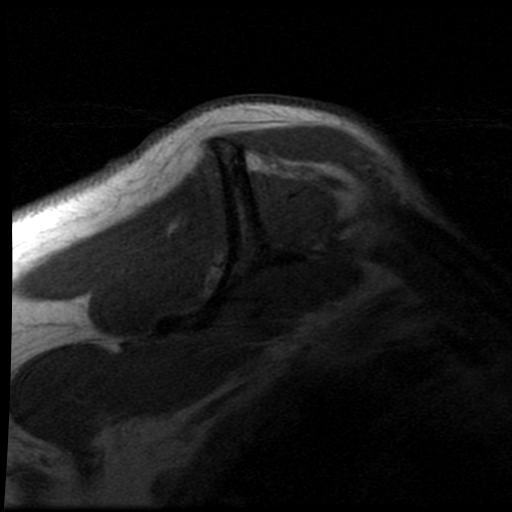
[im 9/18]
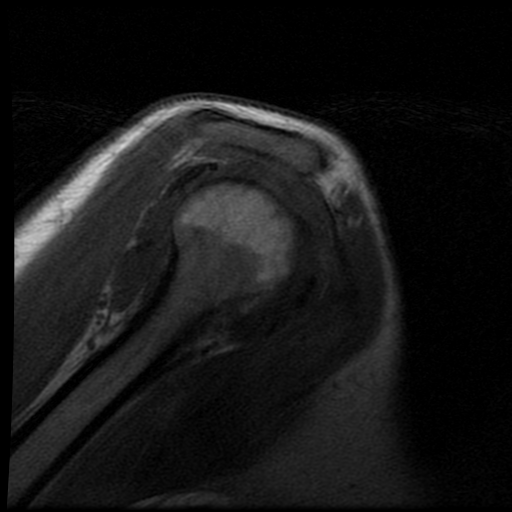
[im 18/18]
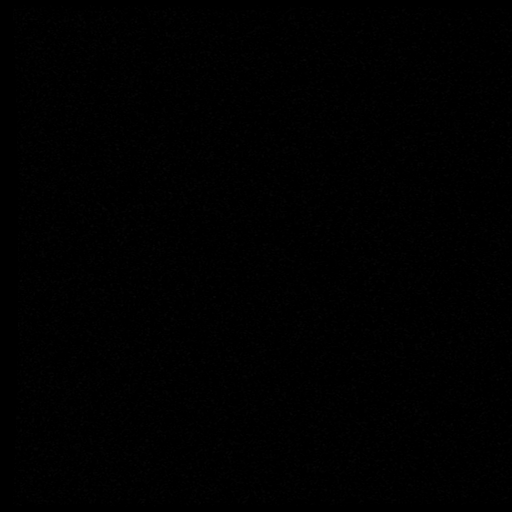

[Series 10: T2 fat-sat · axial · 4.0mm · 0.23mm/px · z∈[-26,+69]mm · 4 of 20 slices shown (3 of 3)]
[im 1/20]
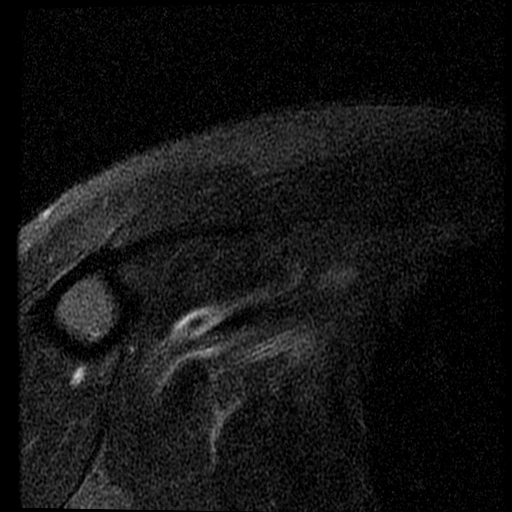
[im 7/20]
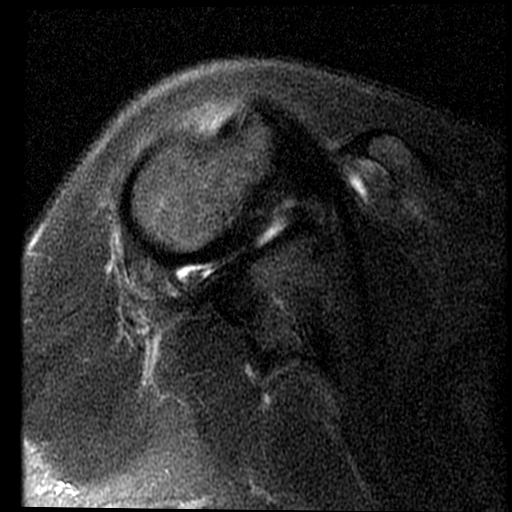
[im 13/20]
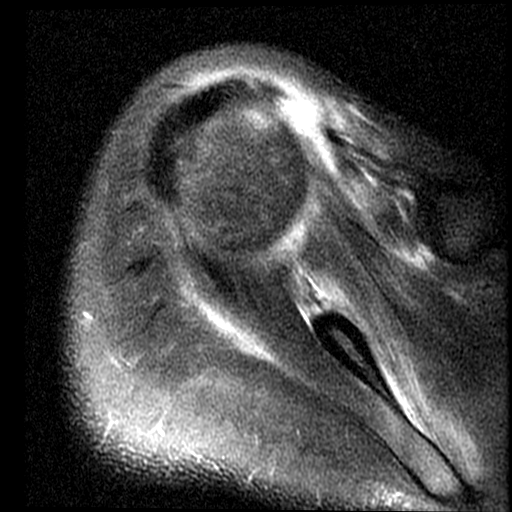
[im 20/20]
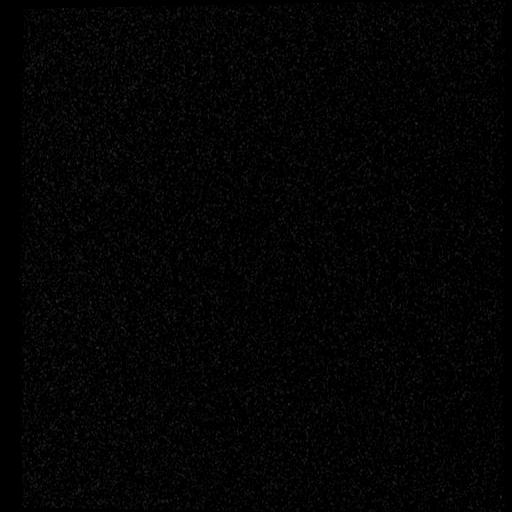

[Series 13: T1 fat-sat · axial · non-contrast · 4.0mm · 0.23mm/px · 1 of 20 slices shown]
[im 1/20]
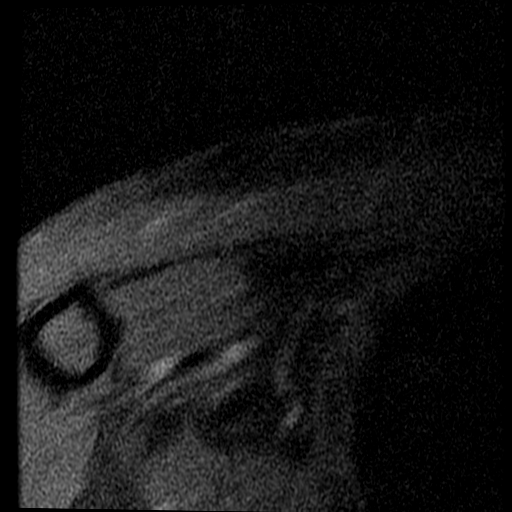

[19 of 40 positions shown; findings below may reference images not displayed]

FINDINGS: Rotator cuff: The rotator cuff tendons are intact. No significant
tendinopathy or tear.

Muscles: Mild edema like signal abnormality involving the
supraspinous and infraspinatus muscles suggesting mild myositis.
This has improved since the prior MRI.

Biceps long head:  Intact

Acromioclavicular Joint: No degenerative changes. No findings for
septic arthritis for osteomyelitis.

Glenohumeral Joint: Small glenohumeral joint effusion but no
findings suspicious for septic arthritis.

Labrum:  Intact.

Bones: No acute bony findings. No worrisome marrow edema or
worrisome enhancement to suggest osteomyelitis.

Other: Persistent but improved subacromial/subdeltoid bursitis.
Diffuse enhancement of the bursa suggesting
inflammation/inflammatory phlegmon. There is a small fluid
collection in the bursa measuring 15 x 4 mm. This previously
measured 16 x 15 mm.
IMPRESSION: 1. Persistent but improved subacromial/subdeltoid bursitis with
diffuse enhancement and a small focal fluid collection.
2. Improved myositis involving the supraspinous and infraspinatus
muscles.
3. No MR findings suspicious for septic arthritis involving the AC
joint or glenohumeral joint.

## 2017-06-24 IMAGING — CR DG CHEST 1V
1 series · 1 of 1 positions shown · non-contrast
Comparison: 08/01/2016 is the most recent chest radiograph.

CLINICAL DATA: Endocarditis. Septic emboli. Continued surveillance,
fever.

EXAM:
CHEST 1 VIEW

[chest pa]
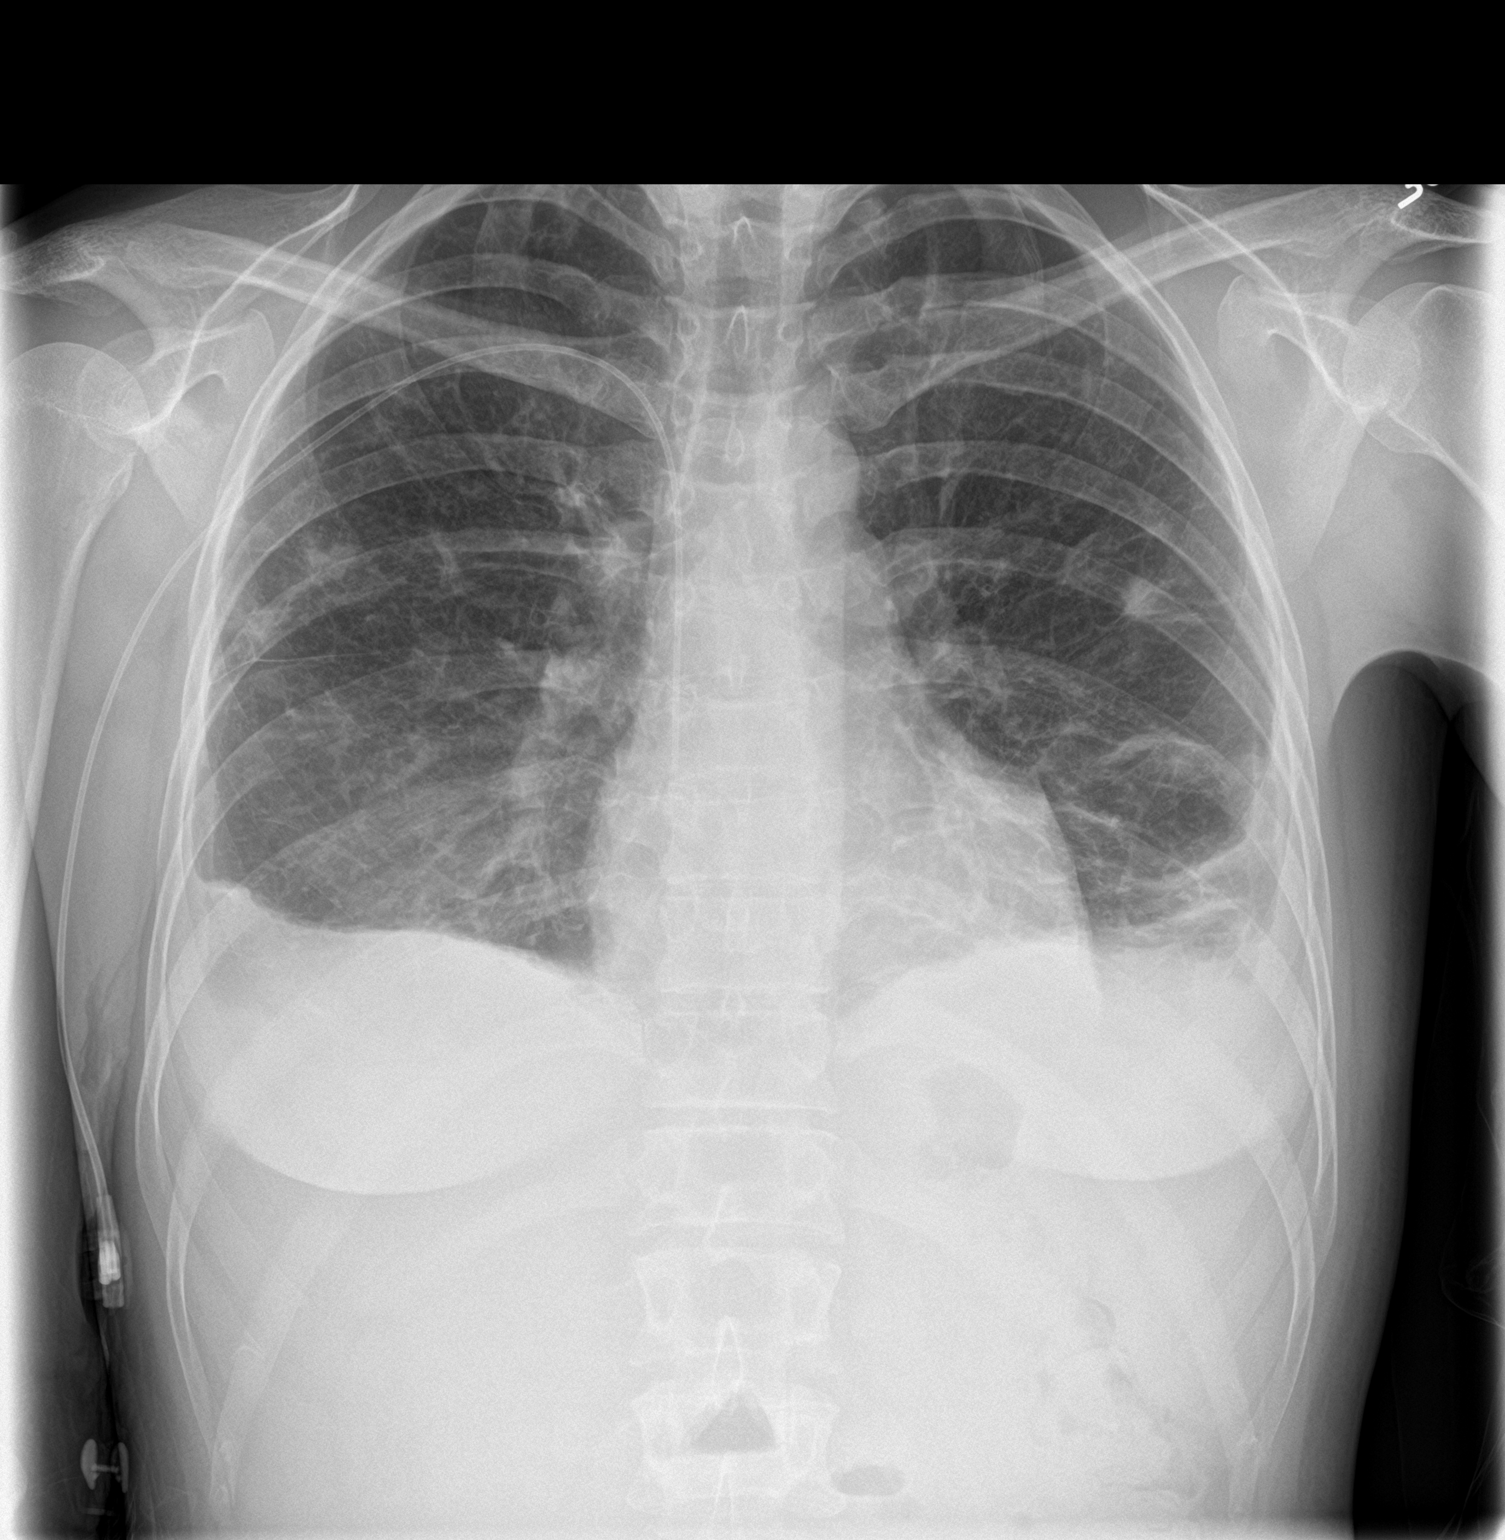

[1 of 1 positions shown; findings below may reference images not displayed]

FINDINGS: The heart size and mediastinal contours are within normal limits.
Continued opacity in both lung fields, both focal and confluent,
consistent with the previously identified airspace disease and
septic emboli. Improved aeration at the LEFT base. BILATERAL
effusions may be slightly improved as well. No osseous findings.
PICC line from RIGHT arm approach lies with its tip at the upper
RIGHT atrium.
IMPRESSION: Slight improvement in aeration and effusions. No apparent
progression of disease.

## 2018-09-15 ENCOUNTER — Encounter (HOSPITAL_BASED_OUTPATIENT_CLINIC_OR_DEPARTMENT_OTHER): Payer: Self-pay | Admitting: *Deleted

## 2018-09-15 ENCOUNTER — Inpatient Hospital Stay (HOSPITAL_BASED_OUTPATIENT_CLINIC_OR_DEPARTMENT_OTHER)
Admission: EM | Admit: 2018-09-15 | Discharge: 2018-09-17 | DRG: 552 | Discharge status: Against Medical Advice | Payer: Self-pay | Attending: Internal Medicine | Admitting: Internal Medicine

## 2018-09-15 ENCOUNTER — Other Ambulatory Visit: Payer: Self-pay

## 2018-09-15 DIAGNOSIS — M4620 Osteomyelitis of vertebra, site unspecified: Secondary | ICD-10-CM

## 2018-09-15 DIAGNOSIS — F1490 Cocaine use, unspecified, uncomplicated: Secondary | ICD-10-CM | POA: Diagnosis present

## 2018-09-15 DIAGNOSIS — Z79899 Other long term (current) drug therapy: Secondary | ICD-10-CM

## 2018-09-15 DIAGNOSIS — F1120 Opioid dependence, uncomplicated: Secondary | ICD-10-CM | POA: Diagnosis present

## 2018-09-15 DIAGNOSIS — F1920 Other psychoactive substance dependence, uncomplicated: Secondary | ICD-10-CM | POA: Diagnosis present

## 2018-09-15 DIAGNOSIS — Z791 Long term (current) use of non-steroidal anti-inflammatories (NSAID): Secondary | ICD-10-CM

## 2018-09-15 DIAGNOSIS — F17210 Nicotine dependence, cigarettes, uncomplicated: Secondary | ICD-10-CM | POA: Diagnosis present

## 2018-09-15 DIAGNOSIS — M4644 Discitis, unspecified, thoracic region: Principal | ICD-10-CM | POA: Diagnosis present

## 2018-09-15 DIAGNOSIS — R222 Localized swelling, mass and lump, trunk: Secondary | ICD-10-CM | POA: Diagnosis present

## 2018-09-15 DIAGNOSIS — B1920 Unspecified viral hepatitis C without hepatic coma: Secondary | ICD-10-CM | POA: Diagnosis present

## 2018-09-15 DIAGNOSIS — F1990 Other psychoactive substance use, unspecified, uncomplicated: Secondary | ICD-10-CM | POA: Diagnosis present

## 2018-09-15 DIAGNOSIS — J45909 Unspecified asthma, uncomplicated: Secondary | ICD-10-CM | POA: Diagnosis present

## 2018-09-15 DIAGNOSIS — Z8614 Personal history of Methicillin resistant Staphylococcus aureus infection: Secondary | ICD-10-CM

## 2018-09-15 DIAGNOSIS — Z86711 Personal history of pulmonary embolism: Secondary | ICD-10-CM

## 2018-09-15 DIAGNOSIS — M546 Pain in thoracic spine: Secondary | ICD-10-CM

## 2018-09-15 DIAGNOSIS — Z8679 Personal history of other diseases of the circulatory system: Secondary | ICD-10-CM

## 2018-09-15 LAB — PREGNANCY, URINE: Preg Test, Ur: NEGATIVE

## 2018-09-15 NOTE — ED Triage Notes (Signed)
Pt reports 4 days of mid back/flank pain. Pain worse with movement

## 2018-09-16 ENCOUNTER — Inpatient Hospital Stay: Payer: Self-pay

## 2018-09-16 ENCOUNTER — Emergency Department (HOSPITAL_COMMUNITY): Payer: Self-pay

## 2018-09-16 ENCOUNTER — Encounter (HOSPITAL_BASED_OUTPATIENT_CLINIC_OR_DEPARTMENT_OTHER): Payer: Self-pay | Admitting: Emergency Medicine

## 2018-09-16 ENCOUNTER — Emergency Department (HOSPITAL_BASED_OUTPATIENT_CLINIC_OR_DEPARTMENT_OTHER): Payer: Self-pay

## 2018-09-16 DIAGNOSIS — F1910 Other psychoactive substance abuse, uncomplicated: Secondary | ICD-10-CM

## 2018-09-16 DIAGNOSIS — F1990 Other psychoactive substance use, unspecified, uncomplicated: Secondary | ICD-10-CM

## 2018-09-16 DIAGNOSIS — F17210 Nicotine dependence, cigarettes, uncomplicated: Secondary | ICD-10-CM

## 2018-09-16 DIAGNOSIS — Z86711 Personal history of pulmonary embolism: Secondary | ICD-10-CM

## 2018-09-16 DIAGNOSIS — Z8614 Personal history of Methicillin resistant Staphylococcus aureus infection: Secondary | ICD-10-CM

## 2018-09-16 DIAGNOSIS — Z872 Personal history of diseases of the skin and subcutaneous tissue: Secondary | ICD-10-CM

## 2018-09-16 DIAGNOSIS — Z8739 Personal history of other diseases of the musculoskeletal system and connective tissue: Secondary | ICD-10-CM

## 2018-09-16 DIAGNOSIS — G061 Intraspinal abscess and granuloma: Secondary | ICD-10-CM

## 2018-09-16 DIAGNOSIS — F1920 Other psychoactive substance dependence, uncomplicated: Secondary | ICD-10-CM | POA: Diagnosis present

## 2018-09-16 DIAGNOSIS — M4644 Discitis, unspecified, thoracic region: Secondary | ICD-10-CM | POA: Diagnosis present

## 2018-09-16 DIAGNOSIS — Z8679 Personal history of other diseases of the circulatory system: Secondary | ICD-10-CM

## 2018-09-16 LAB — COMPREHENSIVE METABOLIC PANEL
ALK PHOS: 98 U/L (ref 38–126)
ALT: 18 U/L (ref 0–44)
AST: 25 U/L (ref 15–41)
Albumin: 3.6 g/dL (ref 3.5–5.0)
Anion gap: 10 (ref 5–15)
BILIRUBIN TOTAL: 0.6 mg/dL (ref 0.3–1.2)
BUN: 12 mg/dL (ref 6–20)
CALCIUM: 9.1 mg/dL (ref 8.9–10.3)
CO2: 23 mmol/L (ref 22–32)
CREATININE: 0.8 mg/dL (ref 0.44–1.00)
Chloride: 98 mmol/L (ref 98–111)
GFR calc Af Amer: >60 mL/min (ref >60)
GFR calc non Af Amer: >60 mL/min (ref >60)
GLUCOSE: 121 mg/dL — AB (ref 70–99)
Potassium: 3.6 mmol/L (ref 3.5–5.1)
Sodium: 131 mmol/L — ABNORMAL LOW (ref 135–145)
TOTAL PROTEIN: 8 g/dL (ref 6.5–8.1)

## 2018-09-16 LAB — MRSA PCR SCREENING: MRSA BY PCR: POSITIVE — AB

## 2018-09-16 LAB — URINALYSIS, ROUTINE W REFLEX MICROSCOPIC
Glucose, UA: NEGATIVE mg/dL
Ketones, ur: 15 mg/dL — AB
LEUKOCYTE UA: NEGATIVE
Nitrite: NEGATIVE
PROTEIN: NEGATIVE mg/dL
SPECIFIC GRAVITY, URINE: 1.02 (ref 1.005–1.030)
pH: 6 (ref 5.0–8.0)

## 2018-09-16 LAB — CBC WITH DIFFERENTIAL/PLATELET
Abs Immature Granulocytes: 0.06 10*3/uL (ref 0.00–0.07)
BASOS ABS: 0 10*3/uL (ref 0.0–0.1)
Basophils Relative: 0 %
EOS PCT: 0 %
Eosinophils Absolute: 0 10*3/uL (ref 0.0–0.5)
HEMATOCRIT: 41.6 % (ref 36.0–46.0)
Hemoglobin: 12.4 g/dL (ref 12.0–15.0)
IMMATURE GRANULOCYTES: 0 %
LYMPHS ABS: 1.8 10*3/uL (ref 0.7–4.0)
Lymphocytes Relative: 13 %
MCH: 25.2 pg — ABNORMAL LOW (ref 26.0–34.0)
MCHC: 29.8 g/dL — AB (ref 30.0–36.0)
MCV: 84.4 fL (ref 80.0–100.0)
Monocytes Absolute: 0.7 10*3/uL (ref 0.1–1.0)
Monocytes Relative: 5 %
NEUTROS PCT: 82 %
NRBC: 0 % (ref 0.0–0.2)
Neutro Abs: 11.4 10*3/uL — ABNORMAL HIGH (ref 1.7–7.7)
Platelets: 228 10*3/uL (ref 150–400)
RBC: 4.93 MIL/uL (ref 3.87–5.11)
RDW: 16.6 % — AB (ref 11.5–15.5)
WBC: 14.1 10*3/uL — AB (ref 4.0–10.5)

## 2018-09-16 LAB — URINALYSIS, MICROSCOPIC (REFLEX)

## 2018-09-16 LAB — RAPID URINE DRUG SCREEN, HOSP PERFORMED
Amphetamines: POSITIVE — AB
BARBITURATES: NOT DETECTED
BENZODIAZEPINES: NOT DETECTED
COCAINE: POSITIVE — AB
OPIATES: POSITIVE — AB
Tetrahydrocannabinol: NOT DETECTED

## 2018-09-16 LAB — SEDIMENTATION RATE: Sed Rate: 39 mm/hr — ABNORMAL HIGH (ref 0–22)

## 2018-09-16 LAB — HEMOGLOBIN A1C
Hgb A1c MFr Bld: 5.7 % — ABNORMAL HIGH (ref 4.8–5.6)
Mean Plasma Glucose: 116.89 mg/dL

## 2018-09-16 LAB — LIPASE, BLOOD: LIPASE: 21 U/L (ref 11–51)

## 2018-09-16 LAB — C-REACTIVE PROTEIN: CRP: 19.6 mg/dL — ABNORMAL HIGH (ref <1.0)

## 2018-09-16 LAB — LACTIC ACID, PLASMA: Lactic Acid, Venous: 0.9 mmol/L (ref 0.5–1.9)

## 2018-09-16 MED ORDER — ONDANSETRON HCL 4 MG/2ML IJ SOLN: 4.0000 mg | Freq: Four times a day (QID) | INTRAMUSCULAR | Status: DC | PRN

## 2018-09-16 MED ORDER — CEFEPIME HCL 2 G IJ SOLR
INTRAMUSCULAR | Status: AC
  Filled 2018-09-16: qty 2

## 2018-09-16 MED ORDER — VANCOMYCIN HCL 500 MG IV SOLR
500.0000 mg | Freq: Two times a day (BID) | INTRAVENOUS | Status: DC
  Filled 2018-09-16: qty 500

## 2018-09-16 MED ORDER — ONDANSETRON 4 MG PO TBDP: 4.0000 mg | ORAL_TABLET | Freq: Four times a day (QID) | ORAL | Status: DC | PRN

## 2018-09-16 MED ORDER — ACETAMINOPHEN 325 MG PO TABS: 650.0000 mg | ORAL_TABLET | Freq: Four times a day (QID) | ORAL | Status: DC | PRN

## 2018-09-16 MED ORDER — METHOCARBAMOL 500 MG PO TABS
500.0000 mg | ORAL_TABLET | Freq: Three times a day (TID) | ORAL | Status: DC | PRN
  Administered 2018-09-16: 500 mg via ORAL
  Filled 2018-09-16 (×2): qty 1

## 2018-09-16 MED ORDER — KETOROLAC TROMETHAMINE 30 MG/ML IJ SOLN
30.0000 mg | Freq: Four times a day (QID) | INTRAMUSCULAR | Status: DC | PRN
  Administered 2018-09-16 – 2018-09-17 (×2): 30 mg via INTRAVENOUS
  Filled 2018-09-16 (×3): qty 1

## 2018-09-16 MED ORDER — BUPRENORPHINE HCL-NALOXONE HCL 2-0.5 MG SL SUBL
2.0000 | SUBLINGUAL_TABLET | SUBLINGUAL | Status: AC | PRN
  Administered 2018-09-16 (×2): 2 via SUBLINGUAL
  Filled 2018-09-16 (×2): qty 2

## 2018-09-16 MED ORDER — OXYCODONE-ACETAMINOPHEN 5-325 MG PO TABS
2.0000 | ORAL_TABLET | Freq: Once | ORAL | Status: AC
  Administered 2018-09-16: 2 via ORAL
  Filled 2018-09-16: qty 2

## 2018-09-16 MED ORDER — METRONIDAZOLE IN NACL 5-0.79 MG/ML-% IV SOLN
500.0000 mg | Freq: Once | INTRAVENOUS | Status: AC
  Administered 2018-09-16: 500 mg via INTRAVENOUS
  Filled 2018-09-16 (×2): qty 100

## 2018-09-16 MED ORDER — LACTATED RINGERS IV SOLN
INTRAVENOUS | Status: DC
  Administered 2018-09-16 (×2): via INTRAVENOUS

## 2018-09-16 MED ORDER — LIDOCAINE 5 % EX PTCH
1.0000 | MEDICATED_PATCH | CUTANEOUS | Status: DC
  Filled 2018-09-16: qty 1

## 2018-09-16 MED ORDER — TETANUS-DIPHTH-ACELL PERTUSSIS 5-2.5-18.5 LF-MCG/0.5 IM SUSP
0.5000 mL | Freq: Once | INTRAMUSCULAR | Status: AC
  Administered 2018-09-16: 0.5 mL via INTRAMUSCULAR
  Filled 2018-09-16: qty 0.5

## 2018-09-16 MED ORDER — SODIUM CHLORIDE 0.9 % IV SOLN
1.0000 g | Freq: Two times a day (BID) | INTRAVENOUS | Status: DC
  Filled 2018-09-16: qty 1

## 2018-09-16 MED ORDER — LACTATED RINGERS IV BOLUS (SEPSIS)
250.0000 mL | Freq: Once | INTRAVENOUS | Status: AC
  Administered 2018-09-16: 250 mL via INTRAVENOUS

## 2018-09-16 MED ORDER — SODIUM CHLORIDE 0.9 % IV SOLN
2.0000 g | Freq: Once | INTRAVENOUS | Status: AC
  Administered 2018-09-16: 2 g via INTRAVENOUS
  Filled 2018-09-16: qty 2

## 2018-09-16 MED ORDER — BUPRENORPHINE HCL-NALOXONE HCL 8-2 MG SL SUBL
1.0000 | SUBLINGUAL_TABLET | Freq: Two times a day (BID) | SUBLINGUAL | Status: DC
  Filled 2018-09-16: qty 1

## 2018-09-16 MED ORDER — HYDROXYZINE HCL 25 MG PO TABS: 25.0000 mg | ORAL_TABLET | Freq: Four times a day (QID) | ORAL | Status: DC | PRN

## 2018-09-16 MED ORDER — IOHEXOL 300 MG/ML  SOLN
100.0000 mL | Freq: Once | INTRAMUSCULAR | Status: AC | PRN
  Administered 2018-09-16: 100 mL via INTRAVENOUS

## 2018-09-16 MED ORDER — DOCUSATE SODIUM 100 MG PO CAPS
100.0000 mg | ORAL_CAPSULE | Freq: Two times a day (BID) | ORAL | Status: DC
  Administered 2018-09-16: 100 mg via ORAL
  Filled 2018-09-16 (×2): qty 1

## 2018-09-16 MED ORDER — LORAZEPAM 2 MG/ML IJ SOLN
1.0000 mg | INTRAMUSCULAR | Status: DC | PRN
  Administered 2018-09-16 – 2018-09-17 (×2): 1 mg via INTRAVENOUS
  Filled 2018-09-16 (×2): qty 1

## 2018-09-16 MED ORDER — HALOPERIDOL LACTATE 5 MG/ML IJ SOLN
2.0000 mg | Freq: Once | INTRAMUSCULAR | Status: AC
  Administered 2018-09-16: 2 mg via INTRAVENOUS
  Filled 2018-09-16: qty 1

## 2018-09-16 MED ORDER — VANCOMYCIN HCL 1000 MG IV SOLR
INTRAVENOUS | Status: AC
  Filled 2018-09-16: qty 1000

## 2018-09-16 MED ORDER — DICYCLOMINE HCL 20 MG PO TABS: 20.0000 mg | ORAL_TABLET | Freq: Four times a day (QID) | ORAL | Status: DC | PRN

## 2018-09-16 MED ORDER — LOPERAMIDE HCL 2 MG PO CAPS: 2.0000 mg | ORAL_CAPSULE | ORAL | Status: DC | PRN

## 2018-09-16 MED ORDER — ACETAMINOPHEN 650 MG RE SUPP: 650.0000 mg | Freq: Four times a day (QID) | RECTAL | Status: DC | PRN

## 2018-09-16 MED ORDER — VANCOMYCIN HCL 1000 MG IV SOLR
1000.0000 mg | Freq: Once | INTRAVENOUS | Status: AC
  Administered 2018-09-16: 1000 mg via INTRAVENOUS

## 2018-09-16 MED ORDER — HALOPERIDOL LACTATE 5 MG/ML IJ SOLN
5.0000 mg | Freq: Once | INTRAMUSCULAR | Status: AC
  Administered 2018-09-16: 5 mg via INTRAVENOUS
  Filled 2018-09-16: qty 1

## 2018-09-16 MED ORDER — NAPROXEN 250 MG PO TABS
500.0000 mg | ORAL_TABLET | Freq: Two times a day (BID) | ORAL | Status: DC | PRN
  Administered 2018-09-16: 500 mg via ORAL
  Filled 2018-09-16: qty 2

## 2018-09-16 MED ORDER — LACTATED RINGERS IV BOLUS (SEPSIS)
1000.0000 mL | Freq: Once | INTRAVENOUS | Status: AC
  Administered 2018-09-16: 1000 mL via INTRAVENOUS

## 2018-09-16 MED ORDER — ONDANSETRON HCL 4 MG PO TABS: 4.0000 mg | ORAL_TABLET | Freq: Four times a day (QID) | ORAL | Status: DC | PRN

## 2018-09-16 MED ORDER — LACTATED RINGERS IV BOLUS (SEPSIS)
500.0000 mL | Freq: Once | INTRAVENOUS | Status: AC
  Administered 2018-09-16: 500 mL via INTRAVENOUS

## 2018-09-16 MED ORDER — POLYETHYLENE GLYCOL 3350 17 G PO PACK: 17.0000 g | PACK | Freq: Every day | ORAL | Status: DC | PRN

## 2018-09-16 MED ORDER — NICOTINE 21 MG/24HR TD PT24
21.0000 mg | MEDICATED_PATCH | Freq: Every day | TRANSDERMAL | Status: DC
  Administered 2018-09-16: 21 mg via TRANSDERMAL
  Filled 2018-09-16 (×2): qty 1

## 2018-09-16 NOTE — Consult Note (Signed)
Chief Complaint: Patient was seen in consultation today for possible discitis aspiration.  Referring Physician(s): Dr. Jonah Blue  Supervising Physician: Oley Balm  Patient Status: [facility] - In-pt  History of Present Illness: Anna Colon is a 38 y.o. female with a past medical history significant for hepatitis C and IVDU (last use was cocaine on 3/15) who presented to [facility] ED earlier this morning with complaints of back pain x4 days. 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 She was admitted for further management. Neurosurgery was consulted who recommended IR consult for aspiration as well as continued IV antibiotics. Patient has been reviewed by Dr. Deanne Coffer who does not note discitis on MRI however the right paravertebral collection may be amenable to aspiration.  Patient reports she has continued back pain that worsens with movement. She also complains of nausea without vomiting as well as chronic dry cough that has been present for several weeks.  She denies any fever, chills or dyspnea.   Past Medical History:  Diagnosis Date   Asthma    Hepatitis C    IVDU (intravenous drug user)     Past Surgical History:  Procedure Laterality Date   CESAREAN SECTION     MULTIPLE EXTRACTIONS WITH ALVEOLOPLASTY N/A 07/25/2016   Procedure: MULTIPLE EXTRACTION OF TOOTH #'s 18,19 and 21 thru 31  WITH ALVEOLOPLASTY;  Surgeon: Charlynne Pander, DDS;  Location: [facility] OR;  Service: Oral Surgery;  Laterality: N/A;    Allergies: Patient has no known allergies.  Medications: Prior to Admission medications   Medication Sig Start Date End Date Taking? Authorizing Provider  acetaminophen (TYLENOL) 325 MG tablet Take 2 tablets (650 mg total) by mouth  every 6 (six) hours as needed for mild pain (or Fever >/= 101). 08/07/16  Yes Maxie Barb, MD  ibuprofen (ADVIL,MOTRIN) 200 MG tablet Take 800 mg by mouth every 6 (six) hours as needed for headache or moderate pain.    Yes [provider]  nicotine (NICODERM CQ - DOSED IN MG/24 HOURS) 21 mg/24hr patch Place 1 patch (21 mg total) onto the skin daily. Patient not taking: Reported on 08/29/2016 08/07/16   Maxie Barb, MD     History reviewed. No pertinent family history.  Social History   Socioeconomic History   Marital status: Divorced    Spouse name: Not on file   Number of children: Not on file   Years of education: Not on file   Highest education level: Not on file  Occupational History   Not on file  Social Needs   Financial resource strain: Not on file   Food insecurity:    Worry: Not on file    Inability: Not on file   Transportation needs:    Medical: Not on file    Non-medical: Not on file  Tobacco Use   Smoking status: Current Every Day Smoker    Packs/day: 1.00    Years: 24.00    Pack years: 24.00   Smokeless tobacco: Never Used  Substance and Sexual Activity   Alcohol use: No   Drug use: Yes    Types: IV, Cocaine, Heroin   Sexual activity: Never    Birth control/protection: None  Lifestyle   Physical activity:    Days per week: Not on file    Minutes per  session: Not on file   Stress: Not on file  Relationships   Social connections:    Talks on phone: Not on file    Gets together: Not on file    Attends religious service: Not on file    Active member of club or organization: Not on file    Attends meetings of clubs or organizations: Not on file    Relationship status: Not on file  Other Topics Concern   Not on file  Social History Narrative   Not on file     Review of Systems: A 12 point ROS discussed and pertinent positives are indicated in the HPI above.  All other systems are negative.  Review of Systems   Constitutional: Negative for chills and fever.  Respiratory: Positive for cough (dry; chronic per patient). Negative for shortness of breath.   Cardiovascular: Negative for chest pain.  Gastrointestinal: Positive for nausea. Negative for abdominal pain, diarrhea and vomiting.  Musculoskeletal: Positive for back pain. Negative for neck pain.  Neurological: Negative for headaches.    Vital Signs: BP 113/80    Pulse 85    Temp 99.1 F (37.3 C) (Oral)    Resp 16    Ht  (1.575 m)    Wt 120 lb (54.4 kg)    SpO2 100%    BMI 21.95 kg/m   Physical Exam Vitals signs and nursing note reviewed.  Constitutional:      General: She is not in acute distress.    Appearance: She is ill-appearing.  HENT:     Head: Normocephalic.  Cardiovascular:     Rate and Rhythm: Normal rate and regular rhythm.  Pulmonary:     Effort: Pulmonary effort is normal.     Breath sounds: Normal breath sounds.  Abdominal:     General: There is no distension.     Palpations: Abdomen is soft.     Tenderness: There is no abdominal tenderness.  Skin:    General: Skin is warm and dry.     Comments: (+) multiple areas of bruising and needle use marks on bilateral upper extremities. No open/weeping wounds, erythema or edema on exam today.   Neurological:     Mental Status: She is alert and oriented to person, place, and time.  Psychiatric:        Mood and Affect: Mood normal.        Behavior: Behavior normal.        Thought Content: Thought content normal.        Judgment: Judgment normal.      MD Evaluation Airway: WNL(top dentures) Heart: WNL Abdomen: WNL Chest/ Lungs: WNL ASA  Classification: 3 Mallampati/Airway Score: One   Imaging: Dg Chest 2 View  Result Date: 09/16/2018 CLINICAL DATA:  Fever and back pain EXAM: CHEST - 2 VIEW COMPARISON:  None. FINDINGS: Mild interstitial opacities. No focal consolidation. Normal cardiomediastinal contours. No pneumothorax or sizable pleural effusion.  IMPRESSION: Chronic bronchitic changes without acute airspace disease. Electronically Signed   By: Deatra Robinson M.D.   On: 09/16/2018 06:08   Mr Thoracic Spine Wo Contrast  Result Date: 09/16/2018 CLINICAL DATA:  Mid to lower back and flank pain for 4 days. IV drug abuse EXAM: MRI THORACIC SPINE WITHOUT CONTRAST TECHNIQUE: Multiplanar, multisequence MR imaging of the thoracic spine was performed. No intravenous contrast was administered. COMPARISON:  Chest CT 07/29/2016 FINDINGS: Alignment:  Normal Vertebrae: Subtle decreased signal within the T7 vertebral body marrow. No fracture. No acute endplate erosion  or disc edema. Cord:  Normal signal and morphology. Paraspinal and other soft tissues: Paravertebral edema eccentric to the right at the level of T7 where there is a posterior mediastinal masslike area measuring 24 x 8 mm on axial slices, suspicious for phlegmon or abscess in this setting. Disc levels: Tubular vertically oriented ventral epidural material behind the T8 body could be disc extrusion or infection. Small noncompressive disc protrusions are seen at T4-5, T5-6, and T9-10. IMPRESSION: 1. 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 2. Noncompressive ventral epidural material at T7-8 to T8-9 which could be infectious or from disc extrusion. Electronically Signed   By: Marnee Spring M.D.   On: 09/16/2018 05:46   Mr Lumbar Spine Wo Contrast  Result Date: 09/16/2018 CLINICAL DATA:  Mid to lower back pain and flank pain. History of IV drug abuse EXAM: MRI LUMBAR SPINE WITHOUT CONTRAST TECHNIQUE: Multiplanar, multisequence MR imaging of the lumbar spine was performed. No intravenous contrast was administered. COMPARISON:  None. FINDINGS: Segmentation:  Standard Alignment:  Normal Vertebrae:  No fracture, evidence of discitis, or bone lesion. Conus medullaris and cauda equina: Conus extends to the L1 level. Conus and cauda  equina appear normal. Paraspinal and other soft tissues: Negative Disc levels: L1-2 and L2-3 small noncompressive disc protrusions. IMPRESSION: 1. No evidence of lumbar spine infection. 2. Mild disc degeneration at L1-2 and L2-3. Electronically Signed   By: Marnee Spring M.D.   On: 09/16/2018 05:48   Ct Abdomen Pelvis W Contrast  Result Date: 09/16/2018 CLINICAL DATA:  Abdominal pain EXAM: CT ABDOMEN AND PELVIS WITH CONTRAST TECHNIQUE: Multidetector CT imaging of the abdomen and pelvis was performed using the standard protocol following bolus administration of intravenous contrast. CONTRAST:  OMNIPAQUE IOHEXOL 300 MG/ML  SOLN COMPARISON:  07/07/2016 FINDINGS: Lower chest: Linear areas of atelectasis or scarring in the lower lungs. Heart is normal size. No effusions. Hepatobiliary: Liver is enlarged with a craniocaudal length of 26 cm. Mild periportal edema. Findings are similar to prior study. No focal hepatic abnormality. Pancreas: No focal abnormality or ductal dilatation. Spleen: Spleen is upper limits normal in size. No focal abnormality. Adrenals/Urinary Tract: No adrenal abnormality. No focal renal abnormality. No stones or hydronephrosis. Urinary bladder is unremarkable. Stomach/Bowel: Normal appendix. Moderate stool burden throughout the colon. No evidence of bowel obstruction. Vascular/Lymphatic: No evidence of aneurysm or adenopathy. Prominent left gonadal vein and veins within the pelvis, likely related to gonadal vein reflux and can be seen with pelvic congestion syndrome. Reproductive: Uterus and adnexa unremarkable.  No mass. Other: No free fluid or free air. Musculoskeletal: No acute bony abnormality. IMPRESSION: Hepatomegaly. Mild periportal edema. Findings are similar to prior study. Prominent periuterine vessels and left gonadal vein, likely related to gonadal vein reflux. This can be associated with pelvic congestion syndrome. Linear areas of atelectasis or scarring in the lung bases.  Electronically Signed   By: Charlett Nose M.D.   On: 09/16/2018 01:09    Labs:  CBC: Recent Labs    09/16/18 0012  WBC 14.1*  HGB 12.4  HCT 41.6  PLT 228    COAGS: No results for input(s): INR, APTT in the last 8760 hours.  BMP: Recent Labs    09/16/18 0012  NA 131*  K 3.6  CL 98  CO2 23  GLUCOSE 121*  BUN 12  CALCIUM 9.1  CREATININE 0.80  GFRNONAA >60  GFRAA >60    LIVER FUNCTION TESTS: Recent  Labs    09/16/18 0012  BILITOT 0.6  AST 25  ALT 18  ALKPHOS 98  PROT 8.0  ALBUMIN 3.6    TUMOR MARKERS: No results for input(s): AFPTM, CEA, CA199, CHROMGRNA in the last 8760 hours.  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 IR was originally consulted with stated reason as discitis - images were reviewed by Dr. Corliss Skains and Dr. Deanne Coffer who both agree there is no discitis. Dr. Deanne Coffer has agreed to proceed with attempted CT guided aspiration of the right paravertebral collection tomorrow (3/17) as long as this safely approachable on image guidance.  Patient to be NPO after midnight, hold heparin/lovenox until post procedure, AM labs ordered.  Risks and benefits of paravertebral abscess aspiration were discussed with the patient including bleeding, infection, spinal cord damage, paralysis, pulmonary embolism or even death.  This interventional procedure involves the use of X-rays and because of the nature of the planned procedure, it is possible that we will have prolonged use of X-ray fluoroscopy. Potential radiation risks to you include (but are not limited to) the following: - A slightly elevated risk for cancer  several years later in life. This risk is typically less than 0.5% percent. This risk is low in comparison to the normal incidence of human  cancer, which is 33% for women and 50% for men according to the [employer]. - Radiation induced injury can include skin redness, resembling a rash, tissue breakdown / ulcers and hair loss (which can be temporary or permanent).  The likelihood of either of these occurring depends on the difficulty of the procedure and whether you are sensitive to radiation due to previous procedures, disease, or genetic conditions.  IF your procedure requires a prolonged use of radiation, you will be notified and given written instructions for further action.  It is your responsibility to monitor the irradiated area for the 2 weeks following the procedure and to notify your physician if you are concerned that you have suffered a radiation induced injury.    All of the patient's questions were answered, patient is agreeable to proceed.  Consent signed and in chart.   Thank you for this interesting consult.  I greatly enjoyed meeting Mckinlee Wadell and look forward to participating in their care.  A copy of this report was sent to the requesting provider on this date.  Electronically Signed: Villa Herb, PA-C 09/16/2018, 10:40 AM   I spent a total of 40 Minutes  in face to face in clinical consultation, greater than 50% of which was counseling/coordinating care for paravertebral aspiration.

## 2018-09-16 NOTE — H&P (Signed)
History and Physical    Anna Colon XBM:[id number] DOB: 09/18/1980 DOA: 09/15/2018  PCP: Patient, No Pcp Per Consultants:  None Patient coming from:  Home - lives with friend; Anna Colon: Father, [phone]  Chief Complaint: Back pain  HPI: Anna Colon is a 38 y.o. female with medical history significant of IVDU, hepatitis, and asthma presenting with back pain and fever.  Patient started having pain in her back 4 days ago.  Pain is on the right side in the middle.  Subjective fevers.  No h/o prior, but does have h/o MRSA.  Last used heroin yesterday morning.  Also smokes cocaine.  She is interested in Suboxone, but would prefer Methadone.  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  She does not appear to have been hospitalized since 2018.  ED Course:  Carryover, per Dr. Hal Hope:  38 year old female with history of IV drug abuse presents with mid back pain and is found to have discitis involving the T7-8 area neurosurgery has been consulted. Admitted for further management.  Review of Systems: As per HPI; otherwise review of systems reviewed and negative.   Ambulatory Status:  Ambulates without assistance  Past Medical History:  Diagnosis Date   Asthma    Hepatitis C    IVDU (intravenous drug user)     Past Surgical History:  Procedure Laterality Date   CESAREAN SECTION     MULTIPLE EXTRACTIONS WITH ALVEOLOPLASTY N/A 07/25/2016   Procedure: MULTIPLE EXTRACTION OF TOOTH #'s 18,19 and 21 thru 31  WITH ALVEOLOPLASTY;  Surgeon: Lenn Cal, DDS;  Location: [place];  Service: Oral Surgery;  Laterality: N/A;    Social History   Socioeconomic History   Marital status: Divorced    Spouse name: Not on file   Number of children: Not on file   Years of education: Not on  file   Highest education level: Not on file  Occupational History   Not on file  Social Needs   Financial resource strain: Not on file   Food insecurity:    Worry: Not on file    Inability: Not on file   Transportation needs:    Medical: Not on file    Non-medical: Not on file  Tobacco Use   Smoking status: Current Every Day Smoker    Packs/day: 1.00    Years: 24.00    Pack years: 24.00   Smokeless tobacco: Never Used  Substance and Sexual Activity   Alcohol use: No   Drug use: Yes    Types: IV, Cocaine, Heroin   Sexual activity: Never    Birth control/protection: None  Lifestyle   Physical activity:    Days per week: Not on file    Minutes per session: Not on file   Stress: Not on file  Relationships   Social connections:    Talks on phone: Not on file    Gets together: Not on file    Attends religious service: Not on file    Active member of club or organization: Not on file    Attends meetings of clubs or organizations: Not on file    Relationship status: Not on file   Intimate partner violence:    Fear of current or ex partner: Not on file    Emotionally abused: Not on file    Physically abused: Not on file  Forced sexual activity: Not on file  Other Topics Concern   Not on file  Social History Narrative   Not on file    No Known Allergies  History reviewed. No pertinent family history.  Prior to Admission medications   Medication Sig Start Date End Date Taking? Authorizing Provider  acetaminophen (TYLENOL) 325 MG tablet Take 2 tablets (650 mg total) by mouth every 6 (six) hours as needed for mild pain (or Fever >/= 101). 08/07/16  Yes Rosita Fire, MD  ibuprofen (ADVIL,MOTRIN) 200 MG tablet Take 800 mg by mouth every 6 (six) hours as needed for headache or moderate pain.    Yes [provider]  cloNIDine (CATAPRES) 0.1 MG tablet Take 1 tablet (0.1 mg total) by mouth 2 (two) times daily. Patient not taking: Reported on  12/04/2016 08/07/16   Rosita Fire, MD  famotidine (PEPCID) 20 MG tablet Take 1 tablet (20 mg total) by mouth daily. Patient not taking: Reported on 12/04/2016 08/08/16   Rosita Fire, MD  ferrous sulfate 325 (65 FE) MG tablet Take 1 tablet (325 mg total) by mouth 2 (two) times daily with a meal. Patient not taking: Reported on 08/29/2016 08/07/16   Rosita Fire, MD  folic acid (FOLVITE) 1 MG tablet Take 1 tablet (1 mg total) by mouth daily. Patient not taking: Reported on 12/04/2016 08/08/16   Rosita Fire, MD  methocarbamol (ROBAXIN) 750 MG tablet Take 1 tablet (750 mg total) by mouth 3 (three) times daily. Patient not taking: Reported on 12/04/2016 08/07/16   Rosita Fire, MD  Multiple Vitamin (MULTIVITAMIN WITH MINERALS) TABS tablet Take 1 tablet by mouth daily. Patient not taking: Reported on 08/29/2016 08/08/16   Rosita Fire, MD  nicotine (NICODERM CQ - DOSED IN MG/24 HOURS) 21 mg/24hr patch Place 1 patch (21 mg total) onto the skin daily. Patient not taking: Reported on 08/29/2016 08/07/16   Rosita Fire, MD    Physical Exam: Vitals:   09/16/18 0700 09/16/18 0715 09/16/18 0730 09/16/18 0745  BP: 130/90 139/88 132/90 125/82  Pulse: 98 (!) 101 (!) 103 91  Resp:  20    Temp:      TempSrc:      SpO2: 99% 99% 98% 99%  Weight:      Height:          General:  Appears calm and comfortable and is NAD; she prefers to keep her eyes closed and not interact but does answer questions appropriately  Eyes:  PERRL, EOMI, normal lids, iris  ENT:  grossly normal hearing, lips & tongue, mmm  Neck:  no LAD, masses or thyromegaly  Cardiovascular:  RRR, no m/r/g. No LE edema.   Respiratory:   CTA bilaterally with no wheezes/rales/rhonchi.  Normal respiratory effort.  Abdomen:  soft, NT, ND, NABS  Back:   normal alignment, no CVAT or spinal TTP  Skin: diffuse track marks including a healing abscess on the left medial forearm  Musculoskeletal:   grossly normal tone BUE/BLE, good ROM, no bony abnormality  Psychiatric: flat mood and affect, speech fluent and appropriate, AOx3  Neurologic:  CN 2-12 grossly intact, moves all extremities in coordinated fashion, sensation intact    Radiological Exams on Admission: Dg Chest 2 View  Result Date: 09/16/2018 CLINICAL DATA:  Fever and back pain EXAM: CHEST - 2 VIEW COMPARISON:  None. FINDINGS: Mild interstitial opacities. No focal consolidation. Normal cardiomediastinal contours. No pneumothorax or sizable pleural effusion. IMPRESSION: Chronic bronchitic changes without  acute airspace disease. Electronically Signed   By: Ulyses Jarred M.D.   On: 09/16/2018 06:08   Mr Thoracic Spine Wo Contrast  Result Date: 09/16/2018 CLINICAL DATA:  Mid to lower back and flank pain for 4 days. IV drug abuse EXAM: MRI THORACIC SPINE WITHOUT CONTRAST TECHNIQUE: Multiplanar, multisequence MR imaging of the thoracic spine was performed. No intravenous contrast was administered. COMPARISON:  Chest CT 07/29/2016 FINDINGS: Alignment:  Normal Vertebrae: Subtle decreased signal within the T7 vertebral body marrow. No fracture. No acute endplate erosion or disc edema. Cord:  Normal signal and morphology. Paraspinal and other soft tissues: Paravertebral edema eccentric to the right at the level of T7 where there is a posterior mediastinal masslike area measuring 24 x 8 mm on axial slices, suspicious for phlegmon or abscess in this setting. Disc levels: Tubular vertically oriented ventral epidural material behind the T8 body could be disc extrusion or infection. Small noncompressive disc protrusions are seen at T4-5, T5-6, and T9-10. IMPRESSION: 1. 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 2. Noncompressive ventral epidural material at T7-8 to T8-9 which could be infectious or from disc extrusion. Electronically Signed   By: Monte Fantasia M.D.   On:  09/16/2018 05:46   Mr Lumbar Spine Wo Contrast  Result Date: 09/16/2018 CLINICAL DATA:  Mid to lower back pain and flank pain. History of IV drug abuse EXAM: MRI LUMBAR SPINE WITHOUT CONTRAST TECHNIQUE: Multiplanar, multisequence MR imaging of the lumbar spine was performed. No intravenous contrast was administered. COMPARISON:  None. FINDINGS: Segmentation:  Standard Alignment:  Normal Vertebrae:  No fracture, evidence of discitis, or bone lesion. Conus medullaris and cauda equina: Conus extends to the L1 level. Conus and cauda equina appear normal. Paraspinal and other soft tissues: Negative Disc levels: L1-2 and L2-3 small noncompressive disc protrusions. IMPRESSION: 1. No evidence of lumbar spine infection. 2. Mild disc degeneration at L1-2 and L2-3. Electronically Signed   By: Monte Fantasia M.D.   On: 09/16/2018 05:48   Ct Abdomen Pelvis W Contrast  Result Date: 09/16/2018 CLINICAL DATA:  Abdominal pain EXAM: CT ABDOMEN AND PELVIS WITH CONTRAST TECHNIQUE: Multidetector CT imaging of the abdomen and pelvis was performed using the standard protocol following bolus administration of intravenous contrast. CONTRAST:  143m OMNIPAQUE IOHEXOL 300 MG/ML  SOLN COMPARISON:  07/07/2016 FINDINGS: Lower chest: Linear areas of atelectasis or scarring in the lower lungs. Heart is normal size. No effusions. Hepatobiliary: Liver is enlarged with a craniocaudal length of 26 cm. Mild periportal edema. Findings are similar to prior study. No focal hepatic abnormality. Pancreas: No focal abnormality or ductal dilatation. Spleen: Spleen is upper limits normal in size. No focal abnormality. Adrenals/Urinary Tract: No adrenal abnormality. No focal renal abnormality. No stones or hydronephrosis. Urinary bladder is unremarkable. Stomach/Bowel: Normal appendix. Moderate stool burden throughout the colon. No evidence of bowel obstruction. Vascular/Lymphatic: No evidence of aneurysm or adenopathy. Prominent left gonadal vein and  veins within the pelvis, likely related to gonadal vein reflux and can be seen with pelvic congestion syndrome. Reproductive: Uterus and adnexa unremarkable.  No mass. Other: No free fluid or free air. Musculoskeletal: No acute bony abnormality. IMPRESSION: Hepatomegaly. Mild periportal edema. Findings are similar to prior study. Prominent periuterine vessels and left gonadal vein, likely related to gonadal vein reflux. This can be associated with pelvic congestion syndrome. Linear areas of atelectasis or scarring in the lung bases. Electronically Signed   By: KRolm BaptiseM.D.  On: 09/16/2018 01:09    EKG: Independently reviewed.  NSR with rate 86; nonspecific ST changes with no evidence of acute ischemia   Labs on Admission: I have personally reviewed the available labs and imaging studies at the time of the admission.  Pertinent labs:   Na++ 131 Glucose 121 Lactate 0.9 WBC 14.1 Upreg negative UA: small bili, small Hgb, 15 ketones, few bacteria UDS: +opiates, cocaine, amphetamines   Assessment/Plan Principal Problem:   Discitis Active Problems:   IVDU (intravenous drug user)   Cigarette smoker   Discitis -Patient with recurrent IVDA with h/o MRSA bacteremia with endocarditis presenting with back pain. -MRI showed paravertebral infection/inflammation at T7 with small right-sided phlegmon or abscess and early osteitis in the T7 body. -Concerning lab findings would include CRP >13.5, elevated ESR (goal is < 1/2 age +27 if female); CRP and ESR are pending.  WBC is often <15 and is currently 14.1. -Neurosurgery consulted and recommended IR evaluation, which has been ordered. -Will need antibiotics for 4-6 weeks - received Cefepime/Vanc/Flagyl in the ER but will hold further antibiotics in an effort to give IR the opportunity to obtain a reasonable specimen. -ID consult requested; Dr. Megan Salon will see the patient. -May need placement to ensure completion of antibiotics, vs. Prolonged  inpatient hospitalization. -Will admit to Med Surg. -With vertebral osteo, needs testing for TB; will add quantiferon gold in addition to HIV. -Further important considerations include nutrition (will order consult), diabetes control (no h/o, will check A1c), tobacco cessation (see below).  IVDA -Long-standing heroin dependence -She has left AMA in the past -She reports that she is interested in quitting.  While she would prefer methadone, she is willing to consider Suboxone. -Will initiate suboxone therapy now; while this may precipitate early withdrawal, hopefully she can reach a level of withdrawal symptom relief that will enable her to remain hospitalized and complete treatment -Suboxone order set used. -Will monitor on COWS protocol -prn orders from the Clonidine withdrawal order set were also ordered.  Tobacco dependence -Encourage cessation.   -This was discussed with the patient and should be reviewed on an ongoing basis.   -Patch ordered at patient request.   DVT prophylaxis:  SCDs (pending IR procedure) Code Status:  Full  Family Communication: None present  Disposition Plan:  To be determined Consults called: ID; Neurosurgery; CM; SW; Spiritual care; Nutrition Admission status: Admit - It is my clinical opinion that admission to INPATIENT is reasonable and necessary because of the expectation that this patient will require hospital care that crosses at least 2 midnights to treat this condition based on the medical complexity of the problems presented.  Given the aforementioned information, the predictability of an adverse outcome is felt to be significant.    Karmen Bongo MD [facility]   How to contact the [facility] Attending or Consulting provider [place] or covering provider during after hours [place], for this patient?  1. Check the care team in [facility] and look for a) attending/consulting TR[facility] provider listed and b) the [facility] team listed 2. Log into [email or web address] and use  [place]'s universal password to access. If you do not have the password, please contact the hospital operator. 3. Locate the [facility] provider you are looking for under [facility] and page to a number that you can be directly reached. 4. If you still have difficulty reaching the provider, please page the [facility] (Director on Call) for the Hospitalists listed on amion for assistance.   09/16/2018, 8:46 AM

## 2018-09-16 NOTE — Progress Notes (Signed)
Pharmacy Antibiotic Note  Anna Colon is a 38 y.o. female admitted on 09/15/2018 with concern for epidural abscess given IVDA and back pain.  Pharmacy has been consulted for Cefepime and Vancomycin dosing. Noted pt with h/o MRSA bacteremia/endocarditis in 2018.  Plan: Cefepime 1gm IV q12h Vancomycin 1000mg  loading dose then 500 mg IV Q 12 hrs. Goal AUC 400-550. Expected AUC: 470 SCr used: 1 Will f/u renal function, micro data, and pt's clinical condition Vanc levels prn   Height: 5\' 2"  (157.5 cm) Weight: 120 lb (54.4 kg) IBW/kg (Calculated) : 50.1  Temp (24hrs), Avg:98.4 F (36.9 C), Min:98.4 F (36.9 C), Max:98.4 F (36.9 C)  Recent Labs  Lab 09/16/18 0005 09/16/18 0012  WBC  --  14.1*  CREATININE  --  0.80  LATICACIDVEN 0.9  --     Estimated Creatinine Clearance: 74.7 mL/min (by C-G formula based on SCr of 0.8 mg/dL).    No Known Allergies  Antimicrobials this admission: 3/16 Vanc >>  3/16 Cefepime >>   Microbiology results: 3/16 BCx:   Thank you for allowing pharmacy to be a part of this patient's care.  Christoper Fabian, PharmD, BCPS Clinical pharmacist  **Pharmacist phone directory can now be found on [email or web address] ([email or web address]).  Listed under [facility]. 09/16/2018 2:16 AM

## 2018-09-16 NOTE — ED Notes (Signed)
ED TO INPATIENT HANDOFF REPORT  ED Nurse Name and Phone #: Geroge Baseman RN [phone]  S Name/Age/Gender Anna Colon 38 y.o. female Room/Bed: 024C/024C  Code Status   Code Status: Prior  Home/SNF/Other Home Patient oriented to: self, place, time and situation Is this baseline? Yes   Triage Complete: Triage complete  Chief Complaint back pain  Triage Note Pt reports 4 days of mid back/flank pain. Pain worse with movement   Allergies No Known Allergies  Level of Care/Admitting Diagnosis ED Disposition    ED Disposition Condition Comment   Admit  Hospital Area: [facility] [[id number]]  Level of Care: Med-Surg [16]  Diagnosis: Discitis [604540]  Admitting Physician: Eduard Clos [phone]  Attending Physician: Eduard Clos [phone]  Estimated length of stay: past midnight tomorrow  Certification:: I certify this patient will need inpatient services for at least 2 midnights  PT Class (Do Not Modify): Inpatient [101]  PT Acc Code (Do Not Modify): Private [1]       B Medical/Surgery History Past Medical History:  Diagnosis Date  . Asthma   . Hepatitis C   . IVDU (intravenous drug user)    Past Surgical History:  Procedure Laterality Date  . CESAREAN SECTION    . MULTIPLE EXTRACTIONS WITH ALVEOLOPLASTY N/A 07/25/2016   Procedure: MULTIPLE EXTRACTION OF TOOTH #'s 18,19 and 21 thru 31  WITH ALVEOLOPLASTY;  Surgeon: Charlynne Pander, DDS;  Location: [facility] OR;  Service: Oral Surgery;  Laterality: N/A;     A IV Location/Drains/Wounds Patient Lines/Drains/Airways Status   Active Line/Drains/Airways    Name:   Placement date:   Placement time:   Site:   Days:   Peripheral IV 09/16/18 Left Antecubital   09/16/18    0006    Antecubital   less than 1          Intake/Output Last 24 hours  Intake/Output Summary (Last 24 hours) at 09/16/2018 8295 Last data filed at 09/16/2018 0411 Gross per 24 hour  Intake 2100 ml  Output -  Net 2100 ml     Labs/Imaging Results for orders placed or performed during the hospital encounter of 09/15/18 (from the past 48 hour(s))  Urinalysis, Routine w reflex microscopic     Status: Abnormal   Collection Time: 09/15/18 11:42 PM  Result Value Ref Range   Color, Urine YELLOW YELLOW   APPearance CLEAR CLEAR   Specific Gravity, Urine 1.020 1.005 - 1.030   pH 6.0 5.0 - 8.0   Glucose, UA NEGATIVE NEGATIVE mg/dL   Hgb urine dipstick SMALL (A) NEGATIVE   Bilirubin Urine SMALL (A) NEGATIVE   Ketones, ur 15 (A) NEGATIVE mg/dL   Protein, ur NEGATIVE NEGATIVE mg/dL   Nitrite NEGATIVE NEGATIVE   Leukocytes,Ua NEGATIVE NEGATIVE    Comment: Performed at [facility], [address] [place], [place] [address]  Rapid urine drug screen (hospital performed)     Status: Abnormal   Collection Time: 09/15/18 11:42 PM  Result Value Ref Range   Opiates POSITIVE (A) NONE DETECTED   Cocaine POSITIVE (A) NONE DETECTED   Benzodiazepines NONE DETECTED NONE DETECTED   Amphetamines POSITIVE (A) NONE DETECTED   Tetrahydrocannabinol NONE DETECTED NONE DETECTED   Barbiturates NONE DETECTED NONE DETECTED    Comment: (NOTE) DRUG SCREEN FOR MEDICAL PURPOSES ONLY.  IF CONFIRMATION IS NEEDED FOR ANY PURPOSE, NOTIFY LAB WITHIN 5 DAYS. LOWEST DETECTABLE LIMITS FOR URINE DRUG SCREEN Drug Class  Cutoff (ng/mL) Amphetamine and metabolites    1000 Barbiturate and metabolites    200 Benzodiazepine                 200 Tricyclics and metabolites     300 Opiates and metabolites        300 Cocaine and metabolites        300 THC                            50 Performed at [facility], [address] [place], [place] [address]   Pregnancy, urine     Status: None   Collection Time: 09/15/18 11:42 PM  Result Value Ref Range   Preg Test, Ur NEGATIVE NEGATIVE    Comment:        THE SENSITIVITY OF THIS METHODOLOGY IS >20 mIU/mL. Performed at [facility], [address] [place], [place] [address]   Urinalysis, Microscopic (reflex)     Status: Abnormal   Collection Time: 09/15/18 11:42 PM  Result Value Ref Range   RBC / HPF 0-5 0 - 5 RBC/hpf   WBC, UA 0-5 0 - 5 WBC/hpf   Bacteria, UA FEW (A) NONE SEEN   Squamous Epithelial / LPF 0-5 0 - 5   Mucus PRESENT     Comment: Performed at [facility], [address] [place], [place] [address]  Lactic acid, plasma     Status: None   Collection Time: 09/16/18 12:05 AM  Result Value Ref Range   Lactic Acid, Venous 0.9 0.5 - 1.9 mmol/L    Comment: Performed at [facility], [address] [place] [employer] [place], [place] [address]  CBC with Differential/Platelet     Status: Abnormal   Collection Time: 09/16/18 12:12 AM  Result Value Ref Range   WBC 14.1 (H) 4.0 - 10.5 K/uL   RBC 4.93 3.87 - 5.11 MIL/uL   Hemoglobin 12.4 12.0 - 15.0 g/dL   HCT 57.9 03.8 - 33.3 %   MCV 84.4 80.0 - 100.0 fL   MCH 25.2 (L) 26.0 - 34.0 pg   MCHC 29.8 (L) 30.0 - 36.0 g/dL   RDW 83.2 (H) 91.9 - 16.6 %   Platelets 228 150 - 400 K/uL   nRBC 0.0 0.0 - 0.2 %   Neutrophils Relative % 82 %   Neutro Abs 11.4 (H) 1.7 - 7.7 K/uL   Lymphocytes Relative 13 %   Lymphs Abs 1.8 0.7 - 4.0 K/uL   Monocytes Relative 5 %   Monocytes Absolute 0.7 0.1 - 1.0 K/uL   Eosinophils Relative 0 %   Eosinophils Absolute 0.0 0.0 - 0.5 K/uL   Basophils Relative 0 %   Basophils Absolute 0.0 0.0 - 0.1 K/uL   Immature Granulocytes 0 %   Abs Immature Granulocytes 0.06 0.00 - 0.07 K/uL    Comment: Performed at [facility], [address] [place], [place] [address]  Comprehensive metabolic panel     Status: Abnormal   Collection Time: 09/16/18 12:12 AM  Result Value Ref Range   Sodium 131 (L) 135 - 145 mmol/L   Potassium 3.6 3.5 - 5.1 mmol/L   Chloride 98 98 - 111 mmol/L   CO2 23 22 - 32 mmol/L   Glucose, Bld 121 (H) 70 - 99 mg/dL   BUN 12 6 - 20 mg/dL   Creatinine, Ser 5.99 0.44 -  1.00 mg/dL   Calcium 9.1 8.9 - 16.1 mg/dL    Total Protein 8.0 6.5 - 8.1 g/dL   Albumin 3.6 3.5 - 5.0 g/dL   AST 25 15 - 41 U/L   ALT 18 0 - 44 U/L   Alkaline Phosphatase 98 38 - 126 U/L   Total Bilirubin 0.6 0.3 - 1.2 mg/dL   GFR calc non Af Amer >60 >60 mL/min   GFR calc Af Amer >60 >60 mL/min   Anion gap 10 5 - 15    Comment: Performed at [facility] [address] [place], [place] [address]  Lipase, blood     Status: None   Collection Time: 09/16/18 12:12 AM  Result Value Ref Range   Lipase 21 11 - 51 U/L    Comment: Performed at [facility], [address] [place], [place] [address]   Dg Chest 2 View  Result Date: 09/16/2018 CLINICAL DATA:  Fever and back pain EXAM: CHEST - 2 VIEW COMPARISON:  None. FINDINGS: Mild interstitial opacities. No focal consolidation. Normal cardiomediastinal contours. No pneumothorax or sizable pleural effusion. IMPRESSION: Chronic bronchitic changes without acute airspace disease. Electronically Signed   By: Deatra Robinson M.D.   On: 09/16/2018 06:08   Mr Thoracic Spine Wo Contrast  Result Date: 09/16/2018 CLINICAL DATA:  Mid to lower back and flank pain for 4 days. IV drug abuse EXAM: MRI THORACIC SPINE WITHOUT CONTRAST TECHNIQUE: Multiplanar, multisequence MR imaging of the thoracic spine was performed. No intravenous contrast was administered. COMPARISON:  Chest CT 07/29/2016 FINDINGS: Alignment:  Normal Vertebrae: Subtle decreased signal within the T7 vertebral body marrow. No fracture. No acute endplate erosion or disc edema. Cord:  Normal signal and morphology. Paraspinal and other soft tissues: Paravertebral edema eccentric to the right at the level of T7 where there is a posterior mediastinal masslike area measuring 24 x 8 mm on axial slices, suspicious for phlegmon or abscess in this setting. Disc levels: Tubular vertically oriented ventral epidural material behind the T8 body could be disc extrusion or infection. Small noncompressive disc protrusions are seen at T4-5,  T5-6, and T9-10. IMPRESSION: 1. 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 2. Noncompressive ventral epidural material at T7-8 to T8-9 which could be infectious or from disc extrusion. Electronically Signed   By: Marnee Spring M.D.   On: 09/16/2018 05:46   Mr Lumbar Spine Wo Contrast  Result Date: 09/16/2018 CLINICAL DATA:  Mid to lower back pain and flank pain. History of IV drug abuse EXAM: MRI LUMBAR SPINE WITHOUT CONTRAST TECHNIQUE: Multiplanar, multisequence MR imaging of the lumbar spine was performed. No intravenous contrast was administered. COMPARISON:  None. FINDINGS: Segmentation:  Standard Alignment:  Normal Vertebrae:  No fracture, evidence of discitis, or bone lesion. Conus medullaris and cauda equina: Conus extends to the L1 level. Conus and cauda equina appear normal. Paraspinal and other soft tissues: Negative Disc levels: L1-2 and L2-3 small noncompressive disc protrusions. IMPRESSION: 1. No evidence of lumbar spine infection. 2. Mild disc degeneration at L1-2 and L2-3. Electronically Signed   By: Marnee Spring M.D.   On: 09/16/2018 05:48   Ct Abdomen Pelvis W Contrast  Result Date: 09/16/2018 CLINICAL DATA:  Abdominal pain EXAM: CT ABDOMEN AND PELVIS WITH CONTRAST TECHNIQUE: Multidetector CT imaging of the abdomen and pelvis was performed using the standard protocol following bolus administration of intravenous contrast. CONTRAST:  OMNIPAQUE IOHEXOL 300  MG/ML  SOLN COMPARISON:  07/07/2016 FINDINGS: Lower chest: Linear areas of atelectasis or scarring in the lower lungs. Heart is normal size. No effusions. Hepatobiliary: Liver is enlarged with a craniocaudal length of 26 cm. Mild periportal edema. Findings are similar to prior study. No focal hepatic abnormality. Pancreas: No focal abnormality or ductal dilatation. Spleen: Spleen is upper limits normal in size. No focal abnormality. Adrenals/Urinary Tract:  No adrenal abnormality. No focal renal abnormality. No stones or hydronephrosis. Urinary bladder is unremarkable. Stomach/Bowel: Normal appendix. Moderate stool burden throughout the colon. No evidence of bowel obstruction. Vascular/Lymphatic: No evidence of aneurysm or adenopathy. Prominent left gonadal vein and veins within the pelvis, likely related to gonadal vein reflux and can be seen with pelvic congestion syndrome. Reproductive: Uterus and adnexa unremarkable.  No mass. Other: No free fluid or free air. Musculoskeletal: No acute bony abnormality. IMPRESSION: Hepatomegaly. Mild periportal edema. Findings are similar to prior study. Prominent periuterine vessels and left gonadal vein, likely related to gonadal vein reflux. This can be associated with pelvic congestion syndrome. Linear areas of atelectasis or scarring in the lung bases. Electronically Signed   By: Charlett Nose M.D.   On: 09/16/2018 01:09    Pending Labs Unresulted Labs (From admission, onward)    Start     Ordered   09/16/18 0013  Pregnancy, urine  ONCE - STAT,   STAT     09/16/18 0012   09/16/18 0012  Blood Culture (routine x 2)  BLOOD CULTURE X 2,   STAT     09/16/18 0012   09/16/18 0005  Lactic acid, plasma  Now then every 2 hours,   STAT     09/16/18 0004          Vitals/Pain Today's Vitals   09/16/18 0322 09/16/18 0415 09/16/18 0430 09/16/18 0615  BP:  (!) 122/94 (!) 133/93 (!) 135/104  Pulse: 80 83 96 87  Resp: (!) 22 (!) 22 (!) 25 (!) 23  Temp:  97.8 F (36.6 C)    TempSrc:  Oral    SpO2: 100% 100% 99% 100%  Weight:      Height:      PainSc:        Isolation Precautions No active isolations  Medications Medications  ceFEPIme (MAXIPIME) 2 g injection (has no administration in time range)  vancomycin (VANCOCIN) 1000 MG powder (has no administration in time range)  vancomycin (VANCOCIN) 500 mg in sodium chloride 0.9 % 100 mL IVPB (has no administration in time range)  ceFEPIme (MAXIPIME) 1 g in sodium  chloride 0.9 % 100 mL IVPB (has no administration in time range)  oxyCODONE-acetaminophen (PERCOCET/ROXICET) 5-325 MG per tablet 2 tablet (has no administration in time range)  lactated ringers bolus 1,000 mL (0 mLs Intravenous Stopped 09/16/18 0411)    And  lactated ringers bolus 500 mL (0 mLs Intravenous Stopped 09/16/18 0411)    And  lactated ringers bolus 250 mL (0 mLs Intravenous Stopped 09/16/18 0411)  ceFEPIme (MAXIPIME) 2 g in sodium chloride 0.9 % 100 mL IVPB (0 g Intravenous Stopped 09/16/18 0411)  metroNIDAZOLE (FLAGYL) IVPB 500 mg (500 mg Intravenous New Bag/Given 09/16/18 0420)  vancomycin (VANCOCIN) 1,000 mg in sodium chloride 0.9 % 250 mL IVPB (0 mg Intravenous Stopped 09/16/18 0411)  Tdap (BOOSTRIX) injection 0.5 mL (0.5 mLs Intramuscular Given 09/16/18 0205)  haloperidol lactate (HALDOL) injection 2 mg (2 mg Intravenous Given 09/16/18 0045)  iohexol (OMNIPAQUE) 300 MG/ML solution 100 mL (100 mLs Intravenous Contrast Given 09/16/18  6010)  haloperidol lactate (HALDOL) injection 5 mg (5 mg Intravenous Given 09/16/18 0328)    Mobility walks Low fall risk   Focused Assessments Neuro Assessment Handoff:    NIH Stroke Scale ( + Modified Stroke Scale Criteria)  LOC Questions (1b. )   +: Answers both questions correctly LOC Commands (1c. )   + : Performs both tasks correctly Best Gaze (2. )  +: Normal Visual (3. )  +: No visual loss Motor Arm, Left (5a. )   +: No drift Motor Arm, Right (5b. )   +: No drift Motor Leg, Left (6a. )   +: No drift Motor Leg, Right (6b. )   +: No drift Sensory (8. )   +: Normal, no sensory loss Best Language (9. )   +: No aphasia Extinction/Inattention (11.)   +: No Abnormality Modified SS Total  +: 0     Neuro Assessment: Exceptions to WDL(chaperoned EDP pt has rectal tone intact. ) Neuro Checks:      Last Documented NIHSS Modified Score: 0 (09/16/18 0425) Has TPA been given? No If patient is a Neuro Trauma and patient is going to OR before floor  call report to 4N Charge nurse: [phone] or [phone]     R Recommendations: See Admitting Provider Note  Report given to

## 2018-09-16 NOTE — ED Notes (Signed)
Patient transported to CT 

## 2018-09-16 NOTE — ED Notes (Signed)
phlebotomy went to try to get culture and unable to find a location to get blood.

## 2018-09-16 NOTE — Consult Note (Signed)
[facility]    Date of Admission:  09/15/2018          Reason for Consult: Thoracic discitis    Referring Provider: Dr. Jonah Blue  Assessment: She has developed thoracic discitis, most likely as a complication of a recent left forearm abscess at 1 of her injecting sites.  I would hold off on any further antibiotic therapy until IR obtains a thoracic aspirate.  I would then start IV vancomycin.  Plan: 1. Start IV vancomycin after thoracic aspirate 2. Await blood culture results  Principal Problem:   Thoracic discitis Active Problems:   IVDU (intravenous drug user)   Polysubstance (excluding opioids) dependence, daily use (HCC)   Cigarette smoker   History of bacterial endocarditis   Scheduled Meds: . [START ON 09/17/2018] buprenorphine-naloxone  1 tablet Sublingual BID  . docusate sodium  100 mg Oral BID  . nicotine  21 mg Transdermal Daily   Continuous Infusions: . lactated ringers 100 mL/hr at 09/16/18 0859   PRN Meds:.acetaminophen **OR** acetaminophen, buprenorphine-naloxone, dicyclomine, hydrOXYzine, loperamide, methocarbamol, naproxen, ondansetron **OR** ondansetron (ZOFRAN) IV, ondansetron, polyethylene glycol  HPI: Anna Colon is a 38 y.o. female who is well-known to our service.  She suffers with addiction, polysubstance use and injecting drug use.  She was hospitalized twice in 2018 with MRSA bacteremia.  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  She left AGAINST MEDICAL ADVICE on her second hospitalization.  She has continued to use drugs despite being in a Suboxone treatment program.  2 weeks ago she developed an abscess on her left forearm and was given trimethoprim sulfamethoxazole by her doctor who runs a treatment program.  4 days ago she developed severe right-sided low back pain leading to admission this morning.  MRI shows evidence of T7 vertebral infection with  an associated small abscess and epidural collection.  Blood cultures are pending.  Unfortunately she received 1 dose of empiric antibiotics in the emergency department.  Aspiration is planned by interventional radiology.   Review of Systems: Review of Systems  Constitutional: Positive for chills, fever, malaise/fatigue and weight loss. Negative for diaphoresis.  HENT: Negative for congestion and sore throat.   Respiratory: Negative for cough, sputum production and shortness of breath.   Cardiovascular: Negative for chest pain.  Gastrointestinal: Negative for abdominal pain, diarrhea, nausea and vomiting.  Genitourinary: Negative for dysuria.  Musculoskeletal: Positive for back pain.  Skin: Negative for rash.  Neurological: Negative for headaches.  Psychiatric/Behavioral: Positive for substance abuse.    Past Medical History:  Diagnosis Date  . Asthma   . Hepatitis C   . IVDU (intravenous drug user)     Social History   Tobacco Use  . Smoking status: Current Every Day Smoker    Packs/day: 1.00    Years: 24.00    Pack years: 24.00  . Smokeless tobacco: Never Used  Substance Use Topics  . Alcohol use: No  . Drug use: Yes    Types: IV, Cocaine, Heroin    History reviewed. No pertinent family history. No Known Allergies  OBJECTIVE: Blood pressure 125/89, pulse 95, temperature 100.1 F (37.8 C), temperature source Oral, resp. rate 18, height 5\' 2"  (1.575 m), weight 54.4 kg, SpO2 100 %.  Physical Exam Constitutional:      Comments: She is resting quietly in bed asking for pain medication.  HENT:     Mouth/Throat:     Pharynx:  Oropharyngeal exudate present.  Eyes:     Conjunctiva/sclera: Conjunctivae normal.  Cardiovascular:     Rate and Rhythm: Normal rate and regular rhythm.     Heart sounds: No murmur.  Pulmonary:     Effort: Pulmonary effort is normal.     Breath sounds: Normal breath sounds.  Abdominal:     Palpations: Abdomen is soft.     Tenderness: There  is no abdominal tenderness.  Musculoskeletal:        General: No swelling or tenderness.  Skin:    Findings: No rash.     Comments: She has an erythematous and slightly indurated area on her left forearm.  There is no fluctuance or drainage.  Psychiatric:        Mood and Affect: Mood normal.     Lab Results Lab Results  Component Value Date   WBC 14.1 (H) 09/16/2018   HGB 12.4 09/16/2018   HCT 41.6 09/16/2018   MCV 84.4 09/16/2018   PLT 228 09/16/2018    Lab Results  Component Value Date   CREATININE 0.80 09/16/2018   BUN 12 09/16/2018   NA 131 (L) 09/16/2018   K 3.6 09/16/2018   CL 98 09/16/2018   CO2 23 09/16/2018    Lab Results  Component Value Date   ALT 18 09/16/2018   AST 25 09/16/2018   ALKPHOS 98 09/16/2018   BILITOT 0.6 09/16/2018     Microbiology: Recent Results (from the past 240 hour(s))  Blood Culture (routine x 2)     Status: None (Preliminary result)   Collection Time: 09/16/18 12:12 AM  Result Value Ref Range Status   Specimen Description   Final    BLOOD LEFT ARM Performed at [facility], [address] [place], [place] [address]    Special Requests   Final    BOTTLES DRAWN AEROBIC AND ANAEROBIC Blood Culture adequate volume Performed at [facility], [address] [place], [place] [address]    Culture   Final    NO GROWTH < 12 HOURS Performed at [facility], [address], [place], [place] [address]    Report Status PENDING  Incomplete  MRSA PCR Screening     Status: Abnormal   Collection Time: 09/16/18  8:40 AM  Result Value Ref Range Status   MRSA by PCR POSITIVE (A) NEGATIVE Final    Comment:        The GeneXpert MRSA Assay (FDA approved for NASAL specimens only), is one component of a comprehensive MRSA colonization surveillance program. It is not intended to diagnose MRSA infection nor to guide or monitor treatment for MRSA infections. RESULT CALLED TO, READ BACK BY AND VERIFIED WITH: O.  Purvis Sheffield RN 12:35 09/16/18 ([email or web address]) Performed at [facility], [address] [place], [place] [address]     [name], MD [facility] Infectious Disease [facility] [address] [facility] [phone] pager   [phone] cell 09/16/2018, 3:20 PM

## 2018-09-16 NOTE — Progress Notes (Signed)
   09/16/18 1500  Clinical Encounter Type  Visited With Patient  Visit Type Initial;Spiritual support  Referral From Nurse  Consult/Referral To Chaplain  Spiritual Encounters  Spiritual Needs Emotional;Prayer  Stress Factors  Patient Stress Factors Health changes;Major life changes;Loss of control   Responded to spiritual care consult. PT was alert and open to talk. PT mentioned her struggle with her addiction and stated that she is tired. She said that she tried everything to recover from her addiction. I offered her spiritual care with empathic listening, ministry of presence, words of comfort and prayer. PT was thankful for the Chaplain visit.  Chaplain Orest Dikes [phone]

## 2018-09-16 NOTE — ED Provider Notes (Signed)
Patient transferred from [facility] for MRI of thoracic and lumbar spine.  Patient with history of IV drug abuse presenting with right-sided thoracic back pain and fever to 102.  No focal weakness, numbness or tingling.  No bowel or bladder incontinence.  Patient with stable vital signs and intact neurological exam. She demonstrates her pain to be in her right paraspinal thoracic back.  There is no tenderness palpation of her T or L-spine.  MRI shows T7 area of phlegmon/abscess and possible epidural abscess as well. CXR stable.  Discussed with PA Vinny for neurosurgery. Patient did receive antibiotics prior to arrival.  Admission discussed with Dr. Toniann Fail.  Patient remains hemodynamically stable protecting her airway.  CRITICAL CARE Performed by: Glynn Octave Total critical care time: 32 minutes Critical care time was exclusive of separately billable procedures and treating other patients. Critical care was necessary to treat or prevent imminent or life-threatening deterioration. Critical care was time spent personally by me on the following activities: development of treatment plan with patient and/or surrogate as well as nursing, discussions with consultants, evaluation of patient's response to treatment, examination of patient, obtaining history from patient or surrogate, ordering and performing treatments and interventions, ordering and review of laboratory studies, ordering and review of radiographic studies, pulse oximetry and re-evaluation of patient's condition.    Glynn Octave, MD 09/16/18 [phone]

## 2018-09-16 NOTE — ED Notes (Signed)
ED TO INPATIENT HANDOFF REPORT  ED Nurse Name and Phone #: Seward Grater [phone]  S Name/Age/Gender Anna Colon 38 y.o. female Room/Bed: 024C/024C  Code Status   Code Status: Prior  Home/SNF/Other Home Patient oriented to: self, place, time and situation Is this baseline? Yes   Triage Complete: Triage complete  Chief Complaint back pain  Triage Note Pt reports 4 days of mid back/flank pain. Pain worse with movement   Allergies No Known Allergies  Level of Care/Admitting Diagnosis ED Disposition    ED Disposition Condition Comment   Admit  Hospital Area: [facility] [[id number]]  Level of Care: Med-Surg [16]  Diagnosis: Discitis [454098]  Admitting Physician: Eduard Clos [phone]  Attending Physician: Eduard Clos [phone]  Estimated length of stay: past midnight tomorrow  Certification:: I certify this patient will need inpatient services for at least 2 midnights  PT Class (Do Not Modify): Inpatient [101]  PT Acc Code (Do Not Modify): Private [1]       B Medical/Surgery History Past Medical History:  Diagnosis Date  . Asthma   . Hepatitis C   . IVDU (intravenous drug user)    Past Surgical History:  Procedure Laterality Date  . CESAREAN SECTION    . MULTIPLE EXTRACTIONS WITH ALVEOLOPLASTY N/A 07/25/2016   Procedure: MULTIPLE EXTRACTION OF TOOTH #'s 18,19 and 21 thru 31  WITH ALVEOLOPLASTY;  Surgeon: Charlynne Pander, DDS;  Location: [facility] OR;  Service: Oral Surgery;  Laterality: N/A;     A IV Location/Drains/Wounds Patient Lines/Drains/Airways Status   Active Line/Drains/Airways    Name:   Placement date:   Placement time:   Site:   Days:   Peripheral IV 09/16/18 Left Antecubital   09/16/18    0006    Antecubital   less than 1          Intake/Output Last 24 hours  Intake/Output Summary (Last 24 hours) at 09/16/2018 9562 Last data filed at 09/16/2018 1308 Gross per 24 hour  Intake 2200 ml  Output -  Net 2200 ml     Labs/Imaging Results for orders placed or performed during the hospital encounter of 09/15/18 (from the past 48 hour(s))  Urinalysis, Routine w reflex microscopic     Status: Abnormal   Collection Time: 09/15/18 11:42 PM  Result Value Ref Range   Color, Urine YELLOW YELLOW   APPearance CLEAR CLEAR   Specific Gravity, Urine 1.020 1.005 - 1.030   pH 6.0 5.0 - 8.0   Glucose, UA NEGATIVE NEGATIVE mg/dL   Hgb urine dipstick SMALL (A) NEGATIVE   Bilirubin Urine SMALL (A) NEGATIVE   Ketones, ur 15 (A) NEGATIVE mg/dL   Protein, ur NEGATIVE NEGATIVE mg/dL   Nitrite NEGATIVE NEGATIVE   Leukocytes,Ua NEGATIVE NEGATIVE    Comment: Performed at [facility], [address] [place], [place] [address]  Rapid urine drug screen (hospital performed)     Status: Abnormal   Collection Time: 09/15/18 11:42 PM  Result Value Ref Range   Opiates POSITIVE (A) NONE DETECTED   Cocaine POSITIVE (A) NONE DETECTED   Benzodiazepines NONE DETECTED NONE DETECTED   Amphetamines POSITIVE (A) NONE DETECTED   Tetrahydrocannabinol NONE DETECTED NONE DETECTED   Barbiturates NONE DETECTED NONE DETECTED    Comment: (NOTE) DRUG SCREEN FOR MEDICAL PURPOSES ONLY.  IF CONFIRMATION IS NEEDED FOR ANY PURPOSE, NOTIFY LAB WITHIN 5 DAYS. LOWEST DETECTABLE LIMITS FOR URINE DRUG SCREEN Drug Class  Cutoff (ng/mL) Amphetamine and metabolites    1000 Barbiturate and metabolites    200 Benzodiazepine                 200 Tricyclics and metabolites     300 Opiates and metabolites        300 Cocaine and metabolites        300 THC                            50 Performed at [facility], [address] [place], [place] [address]   Pregnancy, urine     Status: None   Collection Time: 09/15/18 11:42 PM  Result Value Ref Range   Preg Test, Ur NEGATIVE NEGATIVE    Comment:        THE SENSITIVITY OF THIS METHODOLOGY IS >20 mIU/mL. Performed at [facility], [address] [place], [place] [address]   Urinalysis, Microscopic (reflex)     Status: Abnormal   Collection Time: 09/15/18 11:42 PM  Result Value Ref Range   RBC / HPF 0-5 0 - 5 RBC/hpf   WBC, UA 0-5 0 - 5 WBC/hpf   Bacteria, UA FEW (A) NONE SEEN   Squamous Epithelial / LPF 0-5 0 - 5   Mucus PRESENT     Comment: Performed at [facility], [address] [place], [place] [address]  Lactic acid, plasma     Status: None   Collection Time: 09/16/18 12:05 AM  Result Value Ref Range   Lactic Acid, Venous 0.9 0.5 - 1.9 mmol/L    Comment: Performed at [facility], [address] [place] [employer] [place], [place] [address]  CBC with Differential/Platelet     Status: Abnormal   Collection Time: 09/16/18 12:12 AM  Result Value Ref Range   WBC 14.1 (H) 4.0 - 10.5 K/uL   RBC 4.93 3.87 - 5.11 MIL/uL   Hemoglobin 12.4 12.0 - 15.0 g/dL   HCT 57.9 03.8 - 33.3 %   MCV 84.4 80.0 - 100.0 fL   MCH 25.2 (L) 26.0 - 34.0 pg   MCHC 29.8 (L) 30.0 - 36.0 g/dL   RDW 83.2 (H) 91.9 - 16.6 %   Platelets 228 150 - 400 K/uL   nRBC 0.0 0.0 - 0.2 %   Neutrophils Relative % 82 %   Neutro Abs 11.4 (H) 1.7 - 7.7 K/uL   Lymphocytes Relative 13 %   Lymphs Abs 1.8 0.7 - 4.0 K/uL   Monocytes Relative 5 %   Monocytes Absolute 0.7 0.1 - 1.0 K/uL   Eosinophils Relative 0 %   Eosinophils Absolute 0.0 0.0 - 0.5 K/uL   Basophils Relative 0 %   Basophils Absolute 0.0 0.0 - 0.1 K/uL   Immature Granulocytes 0 %   Abs Immature Granulocytes 0.06 0.00 - 0.07 K/uL    Comment: Performed at [facility], [address] [place], [place] [address]  Comprehensive metabolic panel     Status: Abnormal   Collection Time: 09/16/18 12:12 AM  Result Value Ref Range   Sodium 131 (L) 135 - 145 mmol/L   Potassium 3.6 3.5 - 5.1 mmol/L   Chloride 98 98 - 111 mmol/L   CO2 23 22 - 32 mmol/L   Glucose, Bld 121 (H) 70 - 99 mg/dL   BUN 12 6 - 20 mg/dL   Creatinine, Ser 5.99 0.44 -  1.00 mg/dL   Calcium 9.1 8.9 - 16.1 mg/dL    Total Protein 8.0 6.5 - 8.1 g/dL   Albumin 3.6 3.5 - 5.0 g/dL   AST 25 15 - 41 U/L   ALT 18 0 - 44 U/L   Alkaline Phosphatase 98 38 - 126 U/L   Total Bilirubin 0.6 0.3 - 1.2 mg/dL   GFR calc non Af Amer >60 >60 mL/min   GFR calc Af Amer >60 >60 mL/min   Anion gap 10 5 - 15    Comment: Performed at [facility] [address] [place], [place] [address]  Lipase, blood     Status: None   Collection Time: 09/16/18 12:12 AM  Result Value Ref Range   Lipase 21 11 - 51 U/L    Comment: Performed at [facility], [address] [place], [place] [address]   Dg Chest 2 View  Result Date: 09/16/2018 CLINICAL DATA:  Fever and back pain EXAM: CHEST - 2 VIEW COMPARISON:  None. FINDINGS: Mild interstitial opacities. No focal consolidation. Normal cardiomediastinal contours. No pneumothorax or sizable pleural effusion. IMPRESSION: Chronic bronchitic changes without acute airspace disease. Electronically Signed   By: Deatra Robinson M.D.   On: 09/16/2018 06:08   Mr Thoracic Spine Wo Contrast  Result Date: 09/16/2018 CLINICAL DATA:  Mid to lower back and flank pain for 4 days. IV drug abuse EXAM: MRI THORACIC SPINE WITHOUT CONTRAST TECHNIQUE: Multiplanar, multisequence MR imaging of the thoracic spine was performed. No intravenous contrast was administered. COMPARISON:  Chest CT 07/29/2016 FINDINGS: Alignment:  Normal Vertebrae: Subtle decreased signal within the T7 vertebral body marrow. No fracture. No acute endplate erosion or disc edema. Cord:  Normal signal and morphology. Paraspinal and other soft tissues: Paravertebral edema eccentric to the right at the level of T7 where there is a posterior mediastinal masslike area measuring 24 x 8 mm on axial slices, suspicious for phlegmon or abscess in this setting. Disc levels: Tubular vertically oriented ventral epidural material behind the T8 body could be disc extrusion or infection. Small noncompressive disc protrusions are seen at T4-5,  T5-6, and T9-10. IMPRESSION: 1. 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 2. Noncompressive ventral epidural material at T7-8 to T8-9 which could be infectious or from disc extrusion. Electronically Signed   By: Marnee Spring M.D.   On: 09/16/2018 05:46   Mr Lumbar Spine Wo Contrast  Result Date: 09/16/2018 CLINICAL DATA:  Mid to lower back pain and flank pain. History of IV drug abuse EXAM: MRI LUMBAR SPINE WITHOUT CONTRAST TECHNIQUE: Multiplanar, multisequence MR imaging of the lumbar spine was performed. No intravenous contrast was administered. COMPARISON:  None. FINDINGS: Segmentation:  Standard Alignment:  Normal Vertebrae:  No fracture, evidence of discitis, or bone lesion. Conus medullaris and cauda equina: Conus extends to the L1 level. Conus and cauda equina appear normal. Paraspinal and other soft tissues: Negative Disc levels: L1-2 and L2-3 small noncompressive disc protrusions. IMPRESSION: 1. No evidence of lumbar spine infection. 2. Mild disc degeneration at L1-2 and L2-3. Electronically Signed   By: Marnee Spring M.D.   On: 09/16/2018 05:48   Ct Abdomen Pelvis W Contrast  Result Date: 09/16/2018 CLINICAL DATA:  Abdominal pain EXAM: CT ABDOMEN AND PELVIS WITH CONTRAST TECHNIQUE: Multidetector CT imaging of the abdomen and pelvis was performed using the standard protocol following bolus administration of intravenous contrast. CONTRAST:  OMNIPAQUE IOHEXOL 300  MG/ML  SOLN COMPARISON:  07/07/2016 FINDINGS: Lower chest: Linear areas of atelectasis or scarring in the lower lungs. Heart is normal size. No effusions. Hepatobiliary: Liver is enlarged with a craniocaudal length of 26 cm. Mild periportal edema. Findings are similar to prior study. No focal hepatic abnormality. Pancreas: No focal abnormality or ductal dilatation. Spleen: Spleen is upper limits normal in size. No focal abnormality. Adrenals/Urinary Tract:  No adrenal abnormality. No focal renal abnormality. No stones or hydronephrosis. Urinary bladder is unremarkable. Stomach/Bowel: Normal appendix. Moderate stool burden throughout the colon. No evidence of bowel obstruction. Vascular/Lymphatic: No evidence of aneurysm or adenopathy. Prominent left gonadal vein and veins within the pelvis, likely related to gonadal vein reflux and can be seen with pelvic congestion syndrome. Reproductive: Uterus and adnexa unremarkable.  No mass. Other: No free fluid or free air. Musculoskeletal: No acute bony abnormality. IMPRESSION: Hepatomegaly. Mild periportal edema. Findings are similar to prior study. Prominent periuterine vessels and left gonadal vein, likely related to gonadal vein reflux. This can be associated with pelvic congestion syndrome. Linear areas of atelectasis or scarring in the lung bases. Electronically Signed   By: Charlett Nose M.D.   On: 09/16/2018 01:09    Pending Labs Unresulted Labs (From admission, onward)    Start     Ordered   09/16/18 0013  Pregnancy, urine  ONCE - STAT,   STAT     09/16/18 0012   09/16/18 0012  Blood Culture (routine x 2)  BLOOD CULTURE X 2,   STAT     09/16/18 0012          Vitals/Pain Today's Vitals   09/16/18 0415 09/16/18 0430 09/16/18 0615 09/16/18 0619  BP: (!) 122/94 (!) 133/93 (!) 135/104   Pulse: 83 96 87   Resp: (!) 22 (!) 25 (!) 23   Temp: 97.8 F (36.6 C)     TempSrc: Oral     SpO2: 100% 99% 100%   Weight:      Height:      PainSc:    7     Isolation Precautions No active isolations  Medications Medications  ceFEPIme (MAXIPIME) 2 g injection (has no administration in time range)  vancomycin (VANCOCIN) 1000 MG powder (has no administration in time range)  vancomycin (VANCOCIN) 500 mg in sodium chloride 0.9 % 100 mL IVPB (has no administration in time range)  ceFEPIme (MAXIPIME) 1 g in sodium chloride 0.9 % 100 mL IVPB (has no administration in time range)  lactated ringers bolus 1,000 mL  (0 mLs Intravenous Stopped 09/16/18 0411)    And  lactated ringers bolus 500 mL (0 mLs Intravenous Stopped 09/16/18 0411)    And  lactated ringers bolus 250 mL (0 mLs Intravenous Stopped 09/16/18 0411)  ceFEPIme (MAXIPIME) 2 g in sodium chloride 0.9 % 100 mL IVPB (0 g Intravenous Stopped 09/16/18 0411)  metroNIDAZOLE (FLAGYL) IVPB 500 mg (0 mg Intravenous Stopped 09/16/18 0705)  vancomycin (VANCOCIN) 1,000 mg in sodium chloride 0.9 % 250 mL IVPB (0 mg Intravenous Stopped 09/16/18 0411)  Tdap (BOOSTRIX) injection 0.5 mL (0.5 mLs Intramuscular Given 09/16/18 0205)  haloperidol lactate (HALDOL) injection 2 mg (2 mg Intravenous Given 09/16/18 0045)  iohexol (OMNIPAQUE) 300 MG/ML solution 100 mL (100 mLs Intravenous Contrast Given 09/16/18 0049)  haloperidol lactate (HALDOL) injection 5 mg (5 mg Intravenous Given 09/16/18 0328)  oxyCODONE-acetaminophen (PERCOCET/ROXICET) 5-325 MG per tablet 2 tablet (2 tablets Oral Given 09/16/18 4193)    Mobility walks Low fall risk  Focused Assessments Neuro Assessment Handoff:     NIH Stroke Scale ( + Modified Stroke Scale Criteria)  LOC Questions (1b. )   +: Answers both questions correctly LOC Commands (1c. )   + : Performs both tasks correctly Best Gaze (2. )  +: Normal Visual (3. )  +: No visual loss Motor Arm, Left (5a. )   +: No drift Motor Arm, Right (5b. )   +: No drift Motor Leg, Left (6a. )   +: No drift Motor Leg, Right (6b. )   +: No drift Sensory (8. )   +: Normal, no sensory loss Best Language (9. )   +: No aphasia Extinction/Inattention (11.)   +: No Abnormality Modified SS Total  +: 0     Neuro Assessment: Exceptions to WDL(chaperoned EDP pt has rectal tone intact. ) Neuro Checks:      Last Documented NIHSS Modified Score: 0 (09/16/18 0425)     R Recommendations: See Admitting Provider Note  Report given to:   Additional Notes: abscess to spine, sepsis workup

## 2018-09-16 NOTE — Progress Notes (Signed)
Pt c/o back pain which she said was not relieved with prn medications. MD notified and new order received for Lidoderm patch which pt refused stating it was not going to do anything for her pain; pt noted to be agitated; anxiety and sweating a little; kept of sticking her tongue out frigidity in bed; MD notified and new orders received;  Pt sleeping with call light within reach and family remains at bedside. Will closely monitor pt. Dionne Bucy RN

## 2018-09-16 NOTE — ED Notes (Signed)
Pt has poor venous access. 1 blood culture obtained, unable to obtain 2nd

## 2018-09-16 NOTE — Consult Note (Signed)
Chief Complaint   Chief Complaint  Patient presents with   Back Pain    HPI   Consult requested by: Dr Manus Gunning Reason for consult: Thoracic osteitis with ?epidural abscess  HPI: Anna Colon is a 38 y.o. female with history of IVDU, last injected cocaine yesterday, who presented to ED with a four day history of diffuse thoracic and lumbar back pain. 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 Neurosurgical consultation requested. Currently endorses significant thoracic and lumbar back pain. No conservative treatment. Pain radiates to LLQ. No lower extremity symptoms including N/T or weakness. Denies bowel/bladder dysfunction.   Past medical history significant for IVDU. She reports no other chronic medical issues. Not on any medications including blood thinning agents.   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   Hepatitis C antibody test positive 07/08/2016   Normocytic anemia 07/08/2016   Thrombocytopenia (HCC) 07/08/2016   Sepsis (HCC) 07/07/2016   MRSA bacteremia 07/07/2016   Cavitary pneumonia 07/07/2016   IVDU (intravenous drug user) 07/07/2016   Cigarette smoker 07/07/2016    PMH: Past Medical History:  Diagnosis Date   Asthma    Hepatitis C    IVDU (intravenous drug user)     PSH: Past Surgical History:  Procedure Laterality Date   CESAREAN SECTION     MULTIPLE EXTRACTIONS WITH ALVEOLOPLASTY  N/A 07/25/2016   Procedure: MULTIPLE EXTRACTION OF TOOTH #'s 18,19 and 21 thru 31  WITH ALVEOLOPLASTY;  Surgeon: Charlynne Pander, DDS;  Location: [facility] OR;  Service: Oral Surgery;  Laterality: N/A;    (Not in a hospital admission)   SH: Social History   Tobacco Use   Smoking status: Current Every Day Smoker    Packs/day: 1.00   Smokeless tobacco: Never Used  Substance Use Topics   Alcohol use: No   Drug use: Yes    Types: IV, Cocaine    Comment: heroin    MEDS: Prior to Admission medications   Medication Sig Start Date End Date Taking? Authorizing Provider  acetaminophen (TYLENOL) 325 MG tablet Take 2 tablets (650 mg total) by mouth every 6 (six) hours as needed for mild pain (or Fever >/= 101). 08/07/16   Maxie Barb, MD  cloNIDine (CATAPRES) 0.1 MG tablet Take 1 tablet (0.1 mg total) by mouth 2 (two) times daily. Patient not taking: Reported on 12/04/2016 08/07/16   Maxie Barb, MD  famotidine (PEPCID) 20 MG tablet Take 1 tablet (20 mg total) by mouth daily. Patient not taking: Reported on 12/04/2016 08/08/16   Maxie Barb, MD  ferrous sulfate 325 (65 FE) MG tablet Take 1 tablet (325 mg total) by mouth 2 (two) times daily with a meal. Patient not taking: Reported on 08/29/2016 08/07/16   Maxie Barb, MD  folic acid (FOLVITE) 1 MG tablet Take 1 tablet (1 mg total) by mouth  daily. Patient not taking: Reported on 12/04/2016 08/08/16   Maxie Barb, MD  ibuprofen (ADVIL,MOTRIN) 200 MG tablet Take 800 mg by mouth every 6 (six) hours as needed for headache or moderate pain.     [provider]  methocarbamol (ROBAXIN) 750 MG tablet Take 1 tablet (750 mg total) by mouth 3 (three) times daily. Patient not taking: Reported on 12/04/2016 08/07/16   Maxie Barb, MD  Multiple Vitamin (MULTIVITAMIN WITH MINERALS) TABS tablet Take 1 tablet by mouth daily. Patient not taking: Reported on 08/29/2016 08/08/16   Maxie Barb, MD  nicotine  (NICODERM CQ - DOSED IN MG/24 HOURS) 21 mg/24hr patch Place 1 patch (21 mg total) onto the skin daily. Patient not taking: Reported on 08/29/2016 08/07/16   Maxie Barb, MD    ALLERGY: No Known Allergies  Social History   Tobacco Use   Smoking status: Current Every Day Smoker    Packs/day: 1.00   Smokeless tobacco: Never Used  Substance Use Topics   Alcohol use: No     No family history on file.   ROS   Review of Systems  Constitutional: Positive for fever.  HENT: Negative.   Eyes: Negative.   Respiratory: Negative.   Gastrointestinal: Positive for abdominal pain. Negative for nausea and vomiting.  Genitourinary: Negative.   Musculoskeletal: Positive for back pain, joint pain and myalgias. Negative for falls and neck pain.  Neurological: Negative for dizziness, tingling, tremors, sensory change, speech change, focal weakness, seizures, loss of consciousness, weakness and headaches.    Exam   Vitals:   09/16/18 0430 09/16/18 0615  BP: (!) 133/93 (!) 135/104  Pulse: 96 87  Resp: (!) 25 (!) 23  Temp:    SpO2: 99% 100%   General appearance: WDWN, appears uncomfortable Eyes: No scleral injection Cardiovascular: Regular rate and rhythm without murmurs, rubs, gallops. No edema or variciosities. Distal pulses normal. Pulmonary: Effort normal, non-labored breathing Musculoskeletal:     Muscle tone upper extremities: Normal    Muscle tone lower extremities: Normal    Motor exam: poor effort put forth with strength testing. Does move all extremities well with grossly normal strength. Nonfocal exam. Neurological Mental Status:    - Patient is awake, alert, oriented to person, place, month, year, and situation    - Patient is able to give a clear and coherent history.    - No signs of aphasia or neglect Cranial Nerves    - II: Visual Fields are full. PERRL    - III/IV/VI: EOMI without ptosis or diploplia.     - V: Facial sensation is grossly normal    - VII:  Facial movement is symmetric.     - VIII: hearing is intact to voice    - X: Uvula elevates symmetrically    - XI: Shoulder shrug is symmetric.    - XII: tongue is midline without atrophy or fasciculations.  Sensory: Sensation grossly intact to LT Deep Tendon Reflexes    - 1+ and symmetric patellae.     - No clonus Plantars   - Toes are downgoing bilaterally.   Results - Imaging/Labs   Results for orders placed or performed during the hospital encounter of 09/15/18 (from the past 48 hour(s))  Urinalysis, Routine w reflex microscopic     Status: Abnormal   Collection Time: 09/15/18 11:42 PM  Result Value Ref Range   Color, Urine YELLOW YELLOW   APPearance CLEAR CLEAR   Specific Gravity, Urine 1.020 1.005 -  1.030   pH 6.0 5.0 - 8.0   Glucose, UA NEGATIVE NEGATIVE mg/dL   Hgb urine dipstick SMALL (A) NEGATIVE   Bilirubin Urine SMALL (A) NEGATIVE   Ketones, ur 15 (A) NEGATIVE mg/dL   Protein, ur NEGATIVE NEGATIVE mg/dL   Nitrite NEGATIVE NEGATIVE   Leukocytes,Ua NEGATIVE NEGATIVE    Comment: Performed at [facility] [place], [address] [place], [place] [address]  Rapid urine drug screen (hospital performed)     Status: Abnormal   Collection Time: 09/15/18 11:42 PM  Result Value Ref Range   Opiates POSITIVE (A) NONE DETECTED   Cocaine POSITIVE (A) NONE DETECTED   Benzodiazepines NONE DETECTED NONE DETECTED   Amphetamines POSITIVE (A) NONE DETECTED   Tetrahydrocannabinol NONE DETECTED NONE DETECTED   Barbiturates NONE DETECTED NONE DETECTED    Comment: (NOTE) DRUG SCREEN FOR MEDICAL PURPOSES ONLY.  IF CONFIRMATION IS NEEDED FOR ANY PURPOSE, NOTIFY LAB WITHIN 5 DAYS. LOWEST DETECTABLE LIMITS FOR URINE DRUG SCREEN Drug Class                     Cutoff (ng/mL) Amphetamine and metabolites    1000 Barbiturate and metabolites    200 Benzodiazepine                 200 Tricyclics and metabolites     300 Opiates and metabolites        300 Cocaine and metabolites         300 THC                            50 Performed at [facility] [place], [address] [place], [place] [address]   Pregnancy, urine     Status: None   Collection Time: 09/15/18 11:42 PM  Result Value Ref Range   Preg Test, Ur NEGATIVE NEGATIVE    Comment:        THE SENSITIVITY OF THIS METHODOLOGY IS >20 mIU/mL. Performed at [facility] [place], [address] [place], [place] [address]   Urinalysis, Microscopic (reflex)     Status: Abnormal   Collection Time: 09/15/18 11:42 PM  Result Value Ref Range   RBC / HPF 0-5 0 - 5 RBC/hpf   WBC, UA 0-5 0 - 5 WBC/hpf   Bacteria, UA FEW (A) NONE SEEN   Squamous Epithelial / LPF 0-5 0 - 5   Mucus PRESENT     Comment: Performed at [facility] [place], [address] [place], [place] [address]  Lactic acid, plasma     Status: None   Collection Time: 09/16/18 12:05 AM  Result Value Ref Range   Lactic Acid, Venous 0.9 0.5 - 1.9 mmol/L    Comment: Performed at [facility] [place], [address] [place], [place] [address]  CBC with Differential/Platelet     Status: Abnormal   Collection Time: 09/16/18 12:12 AM  Result Value Ref Range   WBC 14.1 (H) 4.0 - 10.5 K/uL   RBC 4.93 3.87 - 5.11 MIL/uL   Hemoglobin 12.4 12.0 - 15.0 g/dL   HCT 95.641.6 38.736.0 - 56.446.0 %   MCV 84.4 80.0 - 100.0 fL   MCH 25.2 (L) 26.0 - 34.0 pg   MCHC 29.8 (L) 30.0 - 36.0 g/dL   RDW 33.216.6 (H) 95.111.5 - 88.415.5 %   Platelets 228 150 - 400 K/uL   nRBC 0.0 0.0 -  0.2 %   Neutrophils Relative % 82 %   Neutro Abs 11.4 (H) 1.7 - 7.7 K/uL   Lymphocytes Relative 13 %   Lymphs Abs 1.8 0.7 - 4.0 K/uL   Monocytes Relative 5 %   Monocytes Absolute 0.7 0.1 - 1.0 K/uL   Eosinophils Relative 0 %   Eosinophils Absolute 0.0 0.0 - 0.5 K/uL   Basophils Relative 0 %   Basophils Absolute 0.0 0.0 - 0.1 K/uL   Immature Granulocytes 0 %   Abs Immature Granulocytes 0.06 0.00 - 0.07 K/uL    Comment: Performed at [facility], [address] [place], [place] [address]   Comprehensive metabolic panel     Status: Abnormal   Collection Time: 09/16/18 12:12 AM  Result Value Ref Range   Sodium 131 (L) 135 - 145 mmol/L   Potassium 3.6 3.5 - 5.1 mmol/L   Chloride 98 98 - 111 mmol/L   CO2 23 22 - 32 mmol/L   Glucose, Bld 121 (H) 70 - 99 mg/dL   BUN 12 6 - 20 mg/dL   Creatinine, Ser 6.04 0.44 - 1.00 mg/dL   Calcium 9.1 8.9 - 54.0 mg/dL   Total Protein 8.0 6.5 - 8.1 g/dL   Albumin 3.6 3.5 - 5.0 g/dL   AST 25 15 - 41 U/L   ALT 18 0 - 44 U/L   Alkaline Phosphatase 98 38 - 126 U/L   Total Bilirubin 0.6 0.3 - 1.2 mg/dL   GFR calc non Af Amer >60 >60 mL/min   GFR calc Af Amer >60 >60 mL/min   Anion gap 10 5 - 15    Comment: Performed at [facility], [address] [place], [place] [address]  Lipase, blood     Status: None   Collection Time: 09/16/18 12:12 AM  Result Value Ref Range   Lipase 21 11 - 51 U/L    Comment: Performed at [facility], [address] [place], [place] [address]    Dg Chest 2 View  Result Date: 09/16/2018 CLINICAL DATA:  Fever and back pain EXAM: CHEST - 2 VIEW COMPARISON:  None. FINDINGS: Mild interstitial opacities. No focal consolidation. Normal cardiomediastinal contours. No pneumothorax or sizable pleural effusion. IMPRESSION: Chronic bronchitic changes without acute airspace disease. Electronically Signed   By: Deatra Robinson M.D.   On: 09/16/2018 06:08   Mr Thoracic Spine Wo Contrast  Result Date: 09/16/2018 CLINICAL DATA:  Mid to lower back and flank pain for 4 days. IV drug abuse EXAM: MRI THORACIC SPINE WITHOUT CONTRAST TECHNIQUE: Multiplanar, multisequence MR imaging of the thoracic spine was performed. No intravenous contrast was administered. COMPARISON:  Chest CT 07/29/2016 FINDINGS: Alignment:  Normal Vertebrae: Subtle decreased signal within the T7 vertebral body marrow. No fracture. No acute endplate erosion or disc edema. Cord:  Normal signal and morphology. Paraspinal and other soft tissues:  Paravertebral edema eccentric to the right at the level of T7 where there is a posterior mediastinal masslike area measuring 24 x 8 mm on axial slices, suspicious for phlegmon or abscess in this setting. Disc levels: Tubular vertically oriented ventral epidural material behind the T8 body could be disc extrusion or infection. Small noncompressive disc protrusions are seen at T4-5, T5-6, and T9-10. IMPRESSION: 1. 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 2. Noncompressive ventral epidural material at T7-8 to T8-9 which could be infectious or from disc extrusion. Electronically  Signed   By: Marnee Spring M.D.   On: 09/16/2018 05:46   Mr Lumbar Spine Wo Contrast  Result Date: 09/16/2018 CLINICAL DATA:  Mid to lower back pain and flank pain. History of IV drug abuse EXAM: MRI LUMBAR SPINE WITHOUT CONTRAST TECHNIQUE: Multiplanar, multisequence MR imaging of the lumbar spine was performed. No intravenous contrast was administered. COMPARISON:  None. FINDINGS: Segmentation:  Standard Alignment:  Normal Vertebrae:  No fracture, evidence of discitis, or bone lesion. Conus medullaris and cauda equina: Conus extends to the L1 level. Conus and cauda equina appear normal. Paraspinal and other soft tissues: Negative Disc levels: L1-2 and L2-3 small noncompressive disc protrusions. IMPRESSION: 1. No evidence of lumbar spine infection. 2. Mild disc degeneration at L1-2 and L2-3. Electronically Signed   By: Marnee Spring M.D.   On: 09/16/2018 05:48   Ct Abdomen Pelvis W Contrast  Result Date: 09/16/2018 CLINICAL DATA:  Abdominal pain EXAM: CT ABDOMEN AND PELVIS WITH CONTRAST TECHNIQUE: Multidetector CT imaging of the abdomen and pelvis was performed using the standard protocol following bolus administration of intravenous contrast. CONTRAST:  OMNIPAQUE IOHEXOL 300 MG/ML  SOLN COMPARISON:  07/07/2016 FINDINGS: Lower chest: Linear areas of  atelectasis or scarring in the lower lungs. Heart is normal size. No effusions. Hepatobiliary: Liver is enlarged with a craniocaudal length of 26 cm. Mild periportal edema. Findings are similar to prior study. No focal hepatic abnormality. Pancreas: No focal abnormality or ductal dilatation. Spleen: Spleen is upper limits normal in size. No focal abnormality. Adrenals/Urinary Tract: No adrenal abnormality. No focal renal abnormality. No stones or hydronephrosis. Urinary bladder is unremarkable. Stomach/Bowel: Normal appendix. Moderate stool burden throughout the colon. No evidence of bowel obstruction. Vascular/Lymphatic: No evidence of aneurysm or adenopathy. Prominent left gonadal vein and veins within the pelvis, likely related to gonadal vein reflux and can be seen with pelvic congestion syndrome. Reproductive: Uterus and adnexa unremarkable.  No mass. Other: No free fluid or free air. Musculoskeletal: No acute bony abnormality. IMPRESSION: Hepatomegaly. Mild periportal edema. Findings are similar to prior study. Prominent periuterine vessels and left gonadal vein, likely related to gonadal vein reflux. This can be associated with pelvic congestion syndrome. Linear areas of atelectasis or scarring in the lung bases. Electronically Signed   By: Charlett Nose M.D.   On: 09/16/2018 01:09   Impression/Plan   38 y.o. female with history of IVDU, last injected cocaine yesterday, who presented to the ED with a 4 day history of thoracic and lumbar back pain. She was found to have paravertebral infection/inflammation at T7 with a small right sided abscess/phlegmon, early osteitis at T7 and noncompressive likely epidural abscess at T7-8 and T8-9. Normal alignment maintained. She meets sepsis criteria and is admitted under TH. Has recieved Vanc and Cefepime.  She is neurologically intact on exam. There is no indication for acute neurosurgical intervention.  - Rec IR consult for aspiration - Abx per TH/ID - Neuro  checks q 4 hours. Report any change - Can be given a TLSO brace for symptomatic relief, although not necessary to be worn.  Please call for any concerns.

## 2018-09-16 NOTE — Plan of Care (Signed)
Pt is progressing toward desired goal 

## 2018-09-16 NOTE — ED Provider Notes (Signed)
[facility] EMERGENCY DEPARTMENT Provider Note   CSN: [id number] Arrival date & time: 09/15/18  2322    History   Chief Complaint Chief Complaint  Patient presents with   Back Pain    HPI Anna Colon is a 38 y.o. female.     The history is provided by the patient and a friend.  Back Pain  Location:  Thoracic spine and lumbar spine Quality:  Shooting Radiates to: abdomen. Pain severity:  Severe Pain is:  Same all the time Onset quality:  Gradual Timing:  Constant Progression:  Worsening Chronicity:  New Context: not falling, not jumping from heights, not lifting heavy objects, not recent injury and not twisting   Relieved by:  Nothing Worsened by:  Nothing Ineffective treatments:  Narcotics Associated symptoms: abdominal pain and fever   Associated symptoms: no abdominal swelling, no bladder incontinence, no bowel incontinence, no chest pain, no dysuria, no headaches, no leg pain, no numbness, no paresthesias, no pelvic pain, no perianal numbness, no tingling, no weakness and no weight loss   Risk factors: no hx of cancer   Patient with IVDA and Hep C presents with back pain and fever at home to 102 orally.  No weakness nor numbness.  No changes in bowel or bladder function.  No difficulty ambulating.  Pain is worse with movement.  Last used around 11 am.    Past Medical History:  Diagnosis Date   Asthma    Hepatitis C    IVDU (intravenous drug user)     Patient Active Problem List   Diagnosis Date Noted   Back pain    Pain management    FUO (fever of unknown origin)    Polysubstance abuse (HCC)    Bursitis of right shoulder    Pain    Pyogenic arthritis of right shoulder region [facility])    Acute pain of right shoulder    Septic embolism (HCC)    Pressure injury of skin 07/14/2016   Acute septic pulmonary embolism without acute cor pulmonale (HCC)    Sepsis due to methicillin resistant Staphylococcus aureus (MRSA) (HCC)     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   Hepatitis C antibody test positive 07/08/2016   Normocytic anemia 07/08/2016   Thrombocytopenia (HCC) 07/08/2016   Sepsis (HCC) 07/07/2016   MRSA bacteremia 07/07/2016   Cavitary pneumonia 07/07/2016   IVDU (intravenous drug user) 07/07/2016   Cigarette smoker 07/07/2016    Past Surgical History:  Procedure Laterality Date   CESAREAN SECTION     MULTIPLE EXTRACTIONS WITH ALVEOLOPLASTY N/A 07/25/2016   Procedure: MULTIPLE EXTRACTION OF TOOTH #'s 18,19 and 21 thru 31  WITH ALVEOLOPLASTY;  Surgeon: Charlynne Pander, DDS;  Location: [facility] OR;  Service: Oral Surgery;  Laterality: N/A;     OB History   No obstetric history on file.      Home Medications    Prior to Admission medications   Medication Sig Start Date End Date Taking? Authorizing Provider  acetaminophen (TYLENOL) 325 MG tablet Take 2 tablets (650 mg total) by mouth every 6 (six) hours as needed for mild pain (or Fever >/= 101). 08/07/16   Maxie Barb, MD  cloNIDine (CATAPRES) 0.1 MG tablet Take 1 tablet (0.1 mg total) by mouth 2 (two) times daily. Patient not taking: Reported on 12/04/2016 08/07/16   Maxie Barb, MD  famotidine (PEPCID) 20 MG tablet Take 1 tablet (20 mg total) by mouth daily. Patient not taking: Reported on 12/04/2016 08/08/16  Maxie Barb, MD  ferrous sulfate 325 (65 FE) MG tablet Take 1 tablet (325 mg total) by mouth 2 (two) times daily with a meal. Patient not taking: Reported on 08/29/2016 08/07/16   Maxie Barb, MD  folic acid (FOLVITE) 1 MG tablet Take 1 tablet (1 mg total) by mouth daily. Patient not taking: Reported on 12/04/2016 08/08/16   Maxie Barb, MD  ibuprofen (ADVIL,MOTRIN) 200 MG tablet Take 800 mg by mouth every 6 (six) hours as needed for headache or moderate pain.     [provider]  methocarbamol (ROBAXIN) 750 MG tablet Take 1 tablet (750 mg total) by mouth 3 (three) times daily. Patient  not taking: Reported on 12/04/2016 08/07/16   Maxie Barb, MD  Multiple Vitamin (MULTIVITAMIN WITH MINERALS) TABS tablet Take 1 tablet by mouth daily. Patient not taking: Reported on 08/29/2016 08/08/16   Maxie Barb, MD  nicotine (NICODERM CQ - DOSED IN MG/24 HOURS) 21 mg/24hr patch Place 1 patch (21 mg total) onto the skin daily. Patient not taking: Reported on 08/29/2016 08/07/16   Maxie Barb, MD    Family History No family history on file.  Social History Social History   Tobacco Use   Smoking status: Current Every Day Smoker    Packs/day: 1.00   Smokeless tobacco: Never Used  Substance Use Topics   Alcohol use: No   Drug use: Yes    Types: IV, Cocaine    Comment: heroin     Allergies   Patient has no known allergies.   Review of Systems Review of Systems  Constitutional: Positive for diaphoresis and fever. Negative for weight loss.  Eyes: Positive for photophobia.  Respiratory: Negative for cough and shortness of breath.   Cardiovascular: Negative for chest pain and leg swelling.  Gastrointestinal: Positive for abdominal pain. Negative for bowel incontinence.  Genitourinary: Negative for bladder incontinence, difficulty urinating, dysuria and pelvic pain.  Musculoskeletal: Positive for back pain. Negative for joint swelling, neck pain and neck stiffness.  Neurological: Negative for tingling, syncope, facial asymmetry, speech difficulty, weakness, light-headedness, numbness, headaches and paresthesias.  All other systems reviewed and are negative.    Physical Exam Updated Vital Signs BP 104/84    Pulse 85    Temp 98.4 F (36.9 C) (Oral)    Resp 16    Ht 5\' 2"  (1.575 m)    Wt 54.4 kg    SpO2 100%    BMI 21.95 kg/m   Physical Exam Vitals signs and nursing note reviewed.  Constitutional:      Appearance: Normal appearance. She is diaphoretic.  HENT:     Head: Normocephalic and atraumatic.     Nose: Nose normal.     Mouth/Throat:      Mouth: Mucous membranes are moist.     Pharynx: Oropharynx is clear.  Eyes:     General: No scleral icterus.    Conjunctiva/sclera: Conjunctivae normal.     Pupils: Pupils are equal, round, and reactive to light.  Neck:     Musculoskeletal: Normal range of motion and neck supple. No neck rigidity.  Cardiovascular:     Rate and Rhythm: Regular rhythm. Tachycardia present.  Pulmonary:     Effort: Pulmonary effort is normal.     Breath sounds: Normal breath sounds. No wheezing, rhonchi or rales.  Chest:     Chest wall: No tenderness.  Abdominal:     General: Abdomen is flat. Bowel sounds are normal.  Tenderness: There is no abdominal tenderness. There is no guarding.  Genitourinary:    Comments: Chaperones present.  Intact rectal tone, intact perineal sensation no gross blood Musculoskeletal: Normal range of motion.        General: No swelling or tenderness.     Right lower leg: No edema.     Left lower leg: No edema.  Lymphadenopathy:     Cervical: No cervical adenopathy.  Skin:    General: Skin is warm.     Capillary Refill: Capillary refill takes less than 2 seconds.     Findings: No bruising.     Comments: Track marks on BUE  Neurological:     General: No focal deficit present.     Mental Status: She is alert and oriented to person, place, and time.     Deep Tendon Reflexes: Reflexes normal.     Comments: Gait intact ambulated about the department without difficulty  Psychiatric:        Mood and Affect: Mood normal.        Behavior: Behavior normal.      ED Treatments / Results  Labs (all labs ordered are listed, but only abnormal results are displayed) Results for orders placed or performed during the hospital encounter of 09/15/18  CBC with Differential/Platelet  Result Value Ref Range   WBC 14.1 (H) 4.0 - 10.5 K/uL   RBC 4.93 3.87 - 5.11 MIL/uL   Hemoglobin 12.4 12.0 - 15.0 g/dL   HCT 09.841.6 11.936.0 - 14.746.0 %   MCV 84.4 80.0 - 100.0 fL   MCH 25.2 (L) 26.0 - 34.0  pg   MCHC 29.8 (L) 30.0 - 36.0 g/dL   RDW 82.916.6 (H) 56.211.5 - 13.015.5 %   Platelets 228 150 - 400 K/uL   nRBC 0.0 0.0 - 0.2 %   Neutrophils Relative % 82 %   Neutro Abs 11.4 (H) 1.7 - 7.7 K/uL   Lymphocytes Relative 13 %   Lymphs Abs 1.8 0.7 - 4.0 K/uL   Monocytes Relative 5 %   Monocytes Absolute 0.7 0.1 - 1.0 K/uL   Eosinophils Relative 0 %   Eosinophils Absolute 0.0 0.0 - 0.5 K/uL   Basophils Relative 0 %   Basophils Absolute 0.0 0.0 - 0.1 K/uL   Immature Granulocytes 0 %   Abs Immature Granulocytes 0.06 0.00 - 0.07 K/uL  Urinalysis, Routine w reflex microscopic  Result Value Ref Range   Color, Urine YELLOW YELLOW   APPearance CLEAR CLEAR   Specific Gravity, Urine 1.020 1.005 - 1.030   pH 6.0 5.0 - 8.0   Glucose, UA NEGATIVE NEGATIVE mg/dL   Hgb urine dipstick SMALL (A) NEGATIVE   Bilirubin Urine SMALL (A) NEGATIVE   Ketones, ur 15 (A) NEGATIVE mg/dL   Protein, ur NEGATIVE NEGATIVE mg/dL   Nitrite NEGATIVE NEGATIVE   Leukocytes,Ua NEGATIVE NEGATIVE  Rapid urine drug screen (hospital performed)  Result Value Ref Range   Opiates POSITIVE (A) NONE DETECTED   Cocaine POSITIVE (A) NONE DETECTED   Benzodiazepines NONE DETECTED NONE DETECTED   Amphetamines POSITIVE (A) NONE DETECTED   Tetrahydrocannabinol NONE DETECTED NONE DETECTED   Barbiturates NONE DETECTED NONE DETECTED  Pregnancy, urine  Result Value Ref Range   Preg Test, Ur NEGATIVE NEGATIVE  Urinalysis, Microscopic (reflex)  Result Value Ref Range   RBC / HPF 0-5 0 - 5 RBC/hpf   WBC, UA 0-5 0 - 5 WBC/hpf   Bacteria, UA FEW (A) NONE SEEN   Squamous Epithelial /  LPF 0-5 0 - 5   Mucus PRESENT   Lactic acid, plasma  Result Value Ref Range   Lactic Acid, Venous 0.9 0.5 - 1.9 mmol/L  Comprehensive metabolic panel  Result Value Ref Range   Sodium 131 (L) 135 - 145 mmol/L   Potassium 3.6 3.5 - 5.1 mmol/L   Chloride 98 98 - 111 mmol/L   CO2 23 22 - 32 mmol/L   Glucose, Bld 121 (H) 70 - 99 mg/dL   BUN 12 6 - 20 mg/dL    Creatinine, Ser 1.61 0.44 - 1.00 mg/dL   Calcium 9.1 8.9 - 09.6 mg/dL   Total Protein 8.0 6.5 - 8.1 g/dL   Albumin 3.6 3.5 - 5.0 g/dL   AST 25 15 - 41 U/L   ALT 18 0 - 44 U/L   Alkaline Phosphatase 98 38 - 126 U/L   Total Bilirubin 0.6 0.3 - 1.2 mg/dL   GFR calc non Af Amer >60 >60 mL/min   GFR calc Af Amer >60 >60 mL/min   Anion gap 10 5 - 15   Ct Abdomen Pelvis W Contrast  Result Date: 09/16/2018 CLINICAL DATA:  Abdominal pain EXAM: CT ABDOMEN AND PELVIS WITH CONTRAST TECHNIQUE: Multidetector CT imaging of the abdomen and pelvis was performed using the standard protocol following bolus administration of intravenous contrast. CONTRAST:  OMNIPAQUE IOHEXOL 300 MG/ML  SOLN COMPARISON:  07/07/2016 FINDINGS: Lower chest: Linear areas of atelectasis or scarring in the lower lungs. Heart is normal size. No effusions. Hepatobiliary: Liver is enlarged with a craniocaudal length of 26 cm. Mild periportal edema. Findings are similar to prior study. No focal hepatic abnormality. Pancreas: No focal abnormality or ductal dilatation. Spleen: Spleen is upper limits normal in size. No focal abnormality. Adrenals/Urinary Tract: No adrenal abnormality. No focal renal abnormality. No stones or hydronephrosis. Urinary bladder is unremarkable. Stomach/Bowel: Normal appendix. Moderate stool burden throughout the colon. No evidence of bowel obstruction. Vascular/Lymphatic: No evidence of aneurysm or adenopathy. Prominent left gonadal vein and veins within the pelvis, likely related to gonadal vein reflux and can be seen with pelvic congestion syndrome. Reproductive: Uterus and adnexa unremarkable.  No mass. Other: No free fluid or free air. Musculoskeletal: No acute bony abnormality. IMPRESSION: Hepatomegaly. Mild periportal edema. Findings are similar to prior study. Prominent periuterine vessels and left gonadal vein, likely related to gonadal vein reflux. This can be associated with pelvic congestion syndrome.  Linear areas of atelectasis or scarring in the lung bases. Electronically Signed   By: Charlett Nose M.D.   On: 09/16/2018 01:09    EKG EKG Interpretation  Date/Time:  Monday September 16 2018 00:19:08 EDT Ventricular Rate:  86 PR Interval:    QRS Duration: 90 QT Interval:  361 QTC Calculation: 432 R Axis:   64 Text Interpretation:  Sinus rhythm Consider left ventricular hypertrophy Confirmed by Nicanor Alcon, Ashleyann Shoun ([id number]) on 09/16/2018 1:01:35 AM   Radiology Ct Abdomen Pelvis W Contrast  Result Date: 09/16/2018 CLINICAL DATA:  Abdominal pain EXAM: CT ABDOMEN AND PELVIS WITH CONTRAST TECHNIQUE: Multidetector CT imaging of the abdomen and pelvis was performed using the standard protocol following bolus administration of intravenous contrast. CONTRAST:  OMNIPAQUE IOHEXOL 300 MG/ML  SOLN COMPARISON:  07/07/2016 FINDINGS: Lower chest: Linear areas of atelectasis or scarring in the lower lungs. Heart is normal size. No effusions. Hepatobiliary: Liver is enlarged with a craniocaudal length of 26 cm. Mild periportal edema. Findings are similar to prior study. No focal hepatic abnormality. Pancreas: No focal  abnormality or ductal dilatation. Spleen: Spleen is upper limits normal in size. No focal abnormality. Adrenals/Urinary Tract: No adrenal abnormality. No focal renal abnormality. No stones or hydronephrosis. Urinary bladder is unremarkable. Stomach/Bowel: Normal appendix. Moderate stool burden throughout the colon. No evidence of bowel obstruction. Vascular/Lymphatic: No evidence of aneurysm or adenopathy. Prominent left gonadal vein and veins within the pelvis, likely related to gonadal vein reflux and can be seen with pelvic congestion syndrome. Reproductive: Uterus and adnexa unremarkable.  No mass. Other: No free fluid or free air. Musculoskeletal: No acute bony abnormality. IMPRESSION: Hepatomegaly. Mild periportal edema. Findings are similar to prior study. Prominent periuterine vessels and left  gonadal vein, likely related to gonadal vein reflux. This can be associated with pelvic congestion syndrome. Linear areas of atelectasis or scarring in the lung bases. Electronically Signed   By: Charlett Nose M.D.   On: 09/16/2018 01:09    Procedures Procedures (including critical care time)  Medications Ordered in ED Medications  lactated ringers bolus 1,000 mL (1,000 mLs Intravenous New Bag/Given 09/16/18 0029)    And  lactated ringers bolus 500 mL (has no administration in time range)    And  lactated ringers bolus 250 mL (has no administration in time range)  metroNIDAZOLE (FLAGYL) IVPB 500 mg (has no administration in time range)  vancomycin (VANCOCIN) 1,000 mg in sodium chloride 0.9 % 250 mL IVPB (has no administration in time range)  Tdap (BOOSTRIX) injection 0.5 mL (has no administration in time range)  ceFEPIme (MAXIPIME) 2 g injection (has no administration in time range)  ceFEPIme (MAXIPIME) 2 g in sodium chloride 0.9 % 100 mL IVPB (2 g Intravenous New Bag/Given 09/16/18 0038)  haloperidol lactate (HALDOL) injection 2 mg (2 mg Intravenous Given 09/16/18 0045)  iohexol (OMNIPAQUE) 300 MG/ML solution 100 mL (100 mLs Intravenous Contrast Given 09/16/18 0049)     MDM Reviewed: nursing note and vitals Interpretation: labs, ECG and CT scan (elevated white count, positive UDS, negative lactate) Total time providing critical care: 30-74 minutes (sepsis protocol initiated and boluses and multiple IV antibiotics given ). This excludes time spent performing separately reportable procedures and services. Consults: Dr. Manus Gunning who will accept the patient.  CRITICAL CARE Performed by: Skiler Olden K Benjimin Hadden-Rasch Total critical care time: 60 minutes Critical care time was exclusive of separately billable procedures and treating other patients. Critical care was necessary to treat or prevent imminent or life-threatening deterioration. Critical care was time spent personally by me on the following  activities: development of treatment plan with patient and/or surrogate as well as nursing, discussions with consultants, evaluation of patient's response to treatment, examination of patient, obtaining history from patient or surrogate, ordering and performing treatments and interventions, ordering and review of laboratory studies, ordering and review of radiographic studies, pulse oximetry and re-evaluation of patient's condition.  Final Clinical Impressions(s) / ED Diagnoses   Final diagnoses:  Thoracic back pain, unspecified back pain laterality, unspecified chronicity  IVDA (intravenous drug abuse) complicating pregnancy (HCC)   CT is negative for acute finding.  I am concerned for epidural abscess given her IVDA and back pain.  She has intact tone and sensation at this time.    MRIs of the T and L ordered by me.  Dr. Manus Gunning is aware of the transfer.    Patient will need admission regardless given sepsis criteria. Patient is not being given narcotics by me given her history.  I have given IV haldol with good effect.    [facility] to transfer patient.  Kincade Granberg, MD 09/16/18 [id number]

## 2018-09-17 ENCOUNTER — Inpatient Hospital Stay (HOSPITAL_COMMUNITY): Payer: Self-pay

## 2018-09-17 LAB — PROTIME-INR
INR: 1.1 (ref 0.8–1.2)
Prothrombin Time: 14.5 seconds (ref 11.4–15.2)

## 2018-09-17 LAB — BASIC METABOLIC PANEL
Anion gap: 10 (ref 5–15)
BUN: 11 mg/dL (ref 6–20)
CO2: 23 mmol/L (ref 22–32)
Calcium: 8.9 mg/dL (ref 8.9–10.3)
Chloride: 102 mmol/L (ref 98–111)
Creatinine, Ser: 0.63 mg/dL (ref 0.44–1.00)
GFR calc Af Amer: >60 mL/min (ref >60)
GFR calc non Af Amer: >60 mL/min (ref >60)
Glucose, Bld: 115 mg/dL — ABNORMAL HIGH (ref 70–99)
POTASSIUM: 3.4 mmol/L — AB (ref 3.5–5.1)
Sodium: 135 mmol/L (ref 135–145)

## 2018-09-17 LAB — BLOOD CULTURE ID PANEL (REFLEXED)
Acinetobacter baumannii: NOT DETECTED
Candida albicans: NOT DETECTED
Candida glabrata: NOT DETECTED
Candida krusei: NOT DETECTED
Candida parapsilosis: NOT DETECTED
Candida tropicalis: NOT DETECTED
Carbapenem resistance: NOT DETECTED
ENTEROBACTER CLOACAE COMPLEX: NOT DETECTED
Enterobacteriaceae species: DETECTED — AB
Enterococcus species: NOT DETECTED
Escherichia coli: NOT DETECTED
Haemophilus influenzae: NOT DETECTED
Klebsiella oxytoca: NOT DETECTED
Klebsiella pneumoniae: NOT DETECTED
LISTERIA MONOCYTOGENES: NOT DETECTED
Neisseria meningitidis: NOT DETECTED
Proteus species: NOT DETECTED
Pseudomonas aeruginosa: NOT DETECTED
STREPTOCOCCUS PNEUMONIAE: NOT DETECTED
STREPTOCOCCUS PYOGENES: NOT DETECTED
Serratia marcescens: DETECTED — AB
Staphylococcus aureus (BCID): NOT DETECTED
Staphylococcus species: NOT DETECTED
Streptococcus agalactiae: NOT DETECTED
Streptococcus species: NOT DETECTED

## 2018-09-17 LAB — CBC
HCT: 33.7 % — ABNORMAL LOW (ref 36.0–46.0)
HEMOGLOBIN: 10.5 g/dL — AB (ref 12.0–15.0)
MCH: 24.9 pg — ABNORMAL LOW (ref 26.0–34.0)
MCHC: 31.2 g/dL (ref 30.0–36.0)
MCV: 79.9 fL — AB (ref 80.0–100.0)
Platelets: 204 10*3/uL (ref 150–400)
RBC: 4.22 MIL/uL (ref 3.87–5.11)
RDW: 15.8 % — ABNORMAL HIGH (ref 11.5–15.5)
WBC: 8.7 10*3/uL (ref 4.0–10.5)
nRBC: 0 % (ref 0.0–0.2)

## 2018-09-17 LAB — HIV ANTIBODY (ROUTINE TESTING W REFLEX): HIV Screen 4th Generation wRfx: NONREACTIVE

## 2018-09-17 MED ORDER — MIDAZOLAM HCL 2 MG/2ML IJ SOLN
INTRAMUSCULAR | Status: AC
  Filled 2018-09-17: qty 4

## 2018-09-17 MED ORDER — FENTANYL CITRATE (PF) 100 MCG/2ML IJ SOLN
INTRAMUSCULAR | Status: AC | PRN
  Administered 2018-09-17 (×3): 50 ug via INTRAVENOUS
  Administered 2018-09-17: 25 ug via INTRAVENOUS

## 2018-09-17 MED ORDER — FENTANYL CITRATE (PF) 100 MCG/2ML IJ SOLN
INTRAMUSCULAR | Status: AC
  Filled 2018-09-17: qty 4

## 2018-09-17 MED ORDER — MIDAZOLAM HCL 2 MG/2ML IJ SOLN
INTRAMUSCULAR | Status: AC | PRN
  Administered 2018-09-17: 1 mg via INTRAVENOUS
  Administered 2018-09-17: 0.5 mg via INTRAVENOUS
  Administered 2018-09-17 (×2): 1 mg via INTRAVENOUS

## 2018-09-17 MED ORDER — SODIUM CHLORIDE 0.9 % IV SOLN
2.0000 g | INTRAVENOUS | Status: DC
  Filled 2018-09-17: qty 20

## 2018-09-17 MED ORDER — SODIUM CHLORIDE 0.9 % IV SOLN
2.0000 g | Freq: Every day | INTRAVENOUS | Status: DC
  Administered 2018-09-17: 2 g via INTRAVENOUS
  Filled 2018-09-17: qty 20

## 2018-09-17 NOTE — Progress Notes (Signed)
Patient left unit to IR for aspiration of thoracic abscess. Left via bed.

## 2018-09-17 NOTE — Progress Notes (Signed)
PROGRESS NOTE    Anna Colon  HEN:[id number] DOB: [birth year] DOA: 09/15/2018 PCP: Patient, No Pcp Per   Brief Narrative: 38 year old female with IVDU, hepatitis, previous history of tricuspid valve endocarditis with MRSA bacteremia, cavitary pneumonia/septic emboli, right shoulder septic arthritis in 2018, asthma, presents to the ER for evaluation of back pain and fever of 4 days.  She uses cocaine, heroin, last use 3/15.  Patient had recent left forearm abscess at 1 of her injection sites.  Patient left AMA in her second hospitalization in 2018. In ER, had Leukocytosis of 14,100, CRP 19.6, ESR 39, pregnancy test negative, imaging studies showed evidence of T7 vertebral infection with an associated small abscess and epidural collection. Received 1 dose of empiric antibiotics in the ER unfortunately before culture. Patient is being admitted for further management  Subjective: Overnight no fever.  Leukocytosis improved.  CT-guided aspiration this morning. She is on the phone arguing and also was arguing with the man who just walked out of the room. Abel to move all extremities.  Assessment & Plan:   T7 vertebral body infection with associated small abscess and epidural collection, in the setting of IVDU: Plan is to have CT-guided aspiration by IR then resume vancomycin and continue rocephin, follow-up blood cultures.  Neurosurgery has seen the patient, and has signed off no further neurosurgical need.  I did discuss with neurosurgery this morning.  ID is on board.  HIV/QuantiFERON gold has been sent out.  MRSA screen positive.  Leukocytosis has resolved, overnight afebrile. Pharmacy to dose antibiotics.  SERRATIA MARCESCENS/Enterobactrecia bacteremia: f/u blood culture, Antibiotics as above as per ID.   IVDU: Advised cessation, patient is placed back on Suboxone.  Monitor closely continue PRN clonidine.   Cigarette smoker: Encouraged cessation.  Continue nicotine  History of bacterial  endocarditis in the past  History of AMA /Tendency to leave against medical advice. I advised her to stay and complete treatment with iv antibiotics. If she does not get proper antibiotics she may lose ability to walk, may end up with wrosening infection sepsis and death. She is AAO X 4, able to make medical decision and verbalized understanding.  DVT prophylaxis: SCD. now lovenox today due to CT guided aspiration Code Status: full Family Communication: No family at bedside Disposition Plan: remains inpatient pending clinical improvement.  Consultants:  Infectious disease Neurosurgery IR  Procedures:  Antimicrobials: Anti-infectives (From admission, onward)   Start     Dose/Rate Route Frequency Ordered Stop   09/17/18 0800  cefTRIAXone (ROCEPHIN) 2 g in sodium chloride 0.9 % 100 mL IVPB  Status:  Discontinued     2 g 200 mL/hr over 30 Minutes Intravenous Every 24 hours 09/17/18 0122 09/17/18 0130   09/17/18 0145  cefTRIAXone (ROCEPHIN) 2 g in sodium chloride 0.9 % 100 mL IVPB     2 g 200 mL/hr over 30 Minutes Intravenous Daily 09/17/18 0130     09/16/18 1600  vancomycin (VANCOCIN) 500 mg in sodium chloride 0.9 % 100 mL IVPB  Status:  Discontinued     500 mg 100 mL/hr over 60 Minutes Intravenous Every 12 hours 09/16/18 0226 09/16/18 0859   09/16/18 1400  ceFEPIme (MAXIPIME) 1 g in sodium chloride 0.9 % 100 mL IVPB  Status:  Discontinued     1 g 200 mL/hr over 30 Minutes Intravenous Every 12 hours 09/16/18 0226 09/16/18 0859   09/16/18 0209  vancomycin (VANCOCIN) 1000 MG powder    Note to Pharmacy:  Karl Ito   : cabinet  override      09/16/18 0209 09/16/18 1414   09/16/18 0024  ceFEPIme (MAXIPIME) 2 g injection    Note to Pharmacy:  Amedeo Gory   : cabinet override      09/16/18 0024 09/16/18 1229   09/16/18 0015  ceFEPIme (MAXIPIME) 2 g in sodium chloride 0.9 % 100 mL IVPB     2 g 200 mL/hr over 30 Minutes Intravenous  Once 09/16/18 0012 09/16/18 0411   09/16/18 0015   metroNIDAZOLE (FLAGYL) IVPB 500 mg     500 mg 100 mL/hr over 60 Minutes Intravenous  Once 09/16/18 0012 09/16/18 0705   09/16/18 0015  vancomycin (VANCOCIN) 1,000 mg in sodium chloride 0.9 % 250 mL IVPB     1,000 mg 250 mL/hr over 60 Minutes Intravenous  Once 09/16/18 0012 09/16/18 0411       Objective: Vitals:   09/17/18 0405 09/17/18 0920 09/17/18 0940 09/17/18 0945  BP: (!) 141/92 (!) 102/56 121/76 118/89  Pulse: 91 88 78 81  Resp: '20 20 20 '$ (!) 21  Temp: 100.3 F (37.9 C)     TempSrc: Oral     SpO2: 98% 100% 97% 96%  Weight:      Height:        Intake/Output Summary (Last 24 hours) at 09/17/2018 0957 Last data filed at 09/17/2018 0320 Gross per 24 hour  Intake 1947.13 ml  Output --  Net 1947.13 ml   Filed Weights   09/16/18 0102  Weight: 54.4 kg   Weight change:   Body mass index is 21.95 kg/m.  Intake/Output from previous day: 03/16 0701 - 03/17 0700 In: 2047.1 [P.O.:236; I.V.:1611.1; IV Piggyback:200] Out: -  Intake/Output this shift: No intake/output data recorded.  Examination:  General exam: Appears calm and comfortable,Not in distress, older fore the age HEENT:PERRL,Oral mucosa moist, Ear/Nose normal on gross exam Respiratory system: Bilateral equal air entry, normal vesicular breath sounds, no wheezes or crackles  Cardiovascular system: S1 & S2 heard,No JVD, murmurs. Gastrointestinal system: Abdomen is  soft, non tender, non distended, BS +  Nervous System:Alert and oriented. No focal neurological deficits/moving extremities, sensation intact. Extremities: No edema, no clubbing, distal peripheral pulses palpable. Skin: No rashes, lesions, no icterus MSK: Normal muscle bulk,tone ,power, left arm with healing wound.scar Multiple track marks on her forearms.  Medications:  Scheduled Meds:  buprenorphine-naloxone  1 tablet Sublingual BID   docusate sodium  100 mg Oral BID   fentaNYL       lidocaine  1 patch Transdermal Q24H   midazolam        nicotine  21 mg Transdermal Daily   Continuous Infusions:  cefTRIAXone (ROCEPHIN)  IV 2 g (09/17/18 0208)   lactated ringers 100 mL/hr at 09/16/18 2103    Data Reviewed: I have personally reviewed following labs and imaging studies  CBC: Recent Labs  Lab 09/16/18 0012 09/17/18 0726  WBC 14.1* 8.7  NEUTROABS 11.4*  --   HGB 12.4 10.5*  HCT 41.6 33.7*  MCV 84.4 79.9*  PLT 228 517   Basic Metabolic Panel: Recent Labs  Lab 09/16/18 0012 09/17/18 0726  NA 131* 135  K 3.6 3.4*  CL 98 102  CO2 23 23  GLUCOSE 121* 115*  BUN 12 11  CREATININE 0.80 0.63  CALCIUM 9.1 8.9   GFR: Estimated Creatinine Clearance: 74.7 mL/min (by C-G formula based on SCr of 0.63 mg/dL). Liver Function Tests: Recent Labs  Lab 09/16/18 0012  AST 25  ALT 18  ALKPHOS  98  BILITOT 0.6  PROT 8.0  ALBUMIN 3.6   Recent Labs  Lab 09/16/18 0012  LIPASE 21   No results for input(s): AMMONIA in the last 168 hours. Coagulation Profile: Recent Labs  Lab 09/17/18 0726  INR 1.1   Cardiac Enzymes: No results for input(s): CKTOTAL, CKMB, CKMBINDEX, TROPONINI in the last 168 hours. BNP (last 3 results) No results for input(s): PROBNP in the last 8760 hours. HbA1C: Recent Labs    09/16/18 1349  HGBA1C 5.7*   CBG: No results for input(s): GLUCAP in the last 168 hours. Lipid Profile: No results for input(s): CHOL, HDL, LDLCALC, TRIG, CHOLHDL, LDLDIRECT in the last 72 hours. Thyroid Function Tests: No results for input(s): TSH, T4TOTAL, FREET4, T3FREE, THYROIDAB in the last 72 hours. Anemia Panel: No results for input(s): VITAMINB12, FOLATE, FERRITIN, TIBC, IRON, RETICCTPCT in the last 72 hours. Sepsis Labs: Recent Labs  Lab 09/16/18 0005  LATICACIDVEN 0.9    Recent Results (from the past 240 hour(s))  Blood Culture (routine x 2)     Status: Abnormal (Preliminary result)   Collection Time: 09/16/18 12:12 AM  Result Value Ref Range Status   Specimen Description   Final    BLOOD  LEFT ARM Performed at [facility], [address] [place], [place] [address]    Special Requests   Final    BOTTLES DRAWN AEROBIC AND ANAEROBIC Blood Culture adequate volume Performed at [facility], [address] [place], [place] [address]    Culture  Setup Time   Final    GRAM NEGATIVE RODS IN BOTH AEROBIC AND ANAEROBIC BOTTLES CRITICAL RESULT CALLED TO, READ BACK BY AND VERIFIED WITH: C  AMEND PHARMD 09/17/18 0109 JDW    Culture (A)  Final    SERRATIA MARCESCENS SUSCEPTIBILITIES TO FOLLOW Performed at [facility], [place] [address], [place], [place] [address]    Report Status PENDING  Incomplete  Blood Culture ID Panel (Reflexed)     Status: Abnormal   Collection Time: 09/16/18 12:12 AM  Result Value Ref Range Status   Enterococcus species NOT DETECTED NOT DETECTED Final   Listeria monocytogenes NOT DETECTED NOT DETECTED Final   Staphylococcus species NOT DETECTED NOT DETECTED Final   Staphylococcus aureus (BCID) NOT DETECTED NOT DETECTED Final   Streptococcus species NOT DETECTED NOT DETECTED Final   Streptococcus agalactiae NOT DETECTED NOT DETECTED Final   Streptococcus pneumoniae NOT DETECTED NOT DETECTED Final   Streptococcus pyogenes NOT DETECTED NOT DETECTED Final   Acinetobacter baumannii NOT DETECTED NOT DETECTED Final   Enterobacteriaceae species DETECTED (A) NOT DETECTED Final    Comment: Enterobacteriaceae represent a large family of gram-negative bacteria, not a single organism. CRITICAL RESULT CALLED TO, READ BACK BY AND VERIFIED WITH: C AMEND PHARMD 09/17/18 0107  JDW    Enterobacter cloacae complex NOT DETECTED NOT DETECTED Final   Escherichia coli NOT DETECTED NOT DETECTED Final   Klebsiella oxytoca NOT DETECTED NOT DETECTED Final   Klebsiella pneumoniae NOT DETECTED NOT DETECTED Final   Proteus species NOT DETECTED NOT DETECTED Final   Serratia marcescens DETECTED (A) NOT DETECTED Final    Comment: CRITICAL RESULT CALLED TO,  READ BACK BY AND VERIFIED WITH: C AMEND PHARMD 09/17/18 0107 JDW    Carbapenem resistance NOT DETECTED NOT DETECTED Final   Haemophilus influenzae NOT DETECTED NOT DETECTED Final   Neisseria meningitidis NOT DETECTED NOT DETECTED Final   Pseudomonas aeruginosa NOT DETECTED NOT DETECTED Final  Candida albicans NOT DETECTED NOT DETECTED Final   Candida glabrata NOT DETECTED NOT DETECTED Final   Candida krusei NOT DETECTED NOT DETECTED Final   Candida parapsilosis NOT DETECTED NOT DETECTED Final   Candida tropicalis NOT DETECTED NOT DETECTED Final    Comment: Performed at [facility], [place] [address], [place], [place] [address]  MRSA PCR Screening     Status: Abnormal   Collection Time: 09/16/18  8:40 AM  Result Value Ref Range Status   MRSA by PCR POSITIVE (A) NEGATIVE Final    Comment:        The GeneXpert MRSA Assay (FDA approved for NASAL specimens only), is one component of a comprehensive MRSA colonization surveillance program. It is not intended to diagnose MRSA infection nor to guide or monitor treatment for MRSA infections. RESULT CALLED TO, READ BACK BY AND VERIFIED WITH: O. Christiana Pellant RN 12:35 09/16/18 ([email or web address]) Performed at [facility], [place] [address] [place], [place] [address]       Radiology Studies: Dg Chest 2 View  Result Date: 09/16/2018 CLINICAL DATA:  Fever and back pain EXAM: CHEST - 2 VIEW COMPARISON:  None. FINDINGS: Mild interstitial opacities. No focal consolidation. Normal cardiomediastinal contours. No pneumothorax or sizable pleural effusion. IMPRESSION: Chronic bronchitic changes without acute airspace disease. Electronically Signed   By: Ulyses Jarred M.D.   On: 09/16/2018 06:08   Mr Thoracic Spine Wo Contrast  Result Date: 09/16/2018 CLINICAL DATA:  Mid to lower back and flank pain for 4 days. IV drug abuse EXAM: MRI THORACIC SPINE WITHOUT CONTRAST TECHNIQUE: Multiplanar, multisequence MR imaging of the thoracic spine was performed. No  intravenous contrast was administered. COMPARISON:  Chest CT 07/29/2016 FINDINGS: Alignment:  Normal Vertebrae: Subtle decreased signal within the T7 vertebral body marrow. No fracture. No acute endplate erosion or disc edema. Cord:  Normal signal and morphology. Paraspinal and other soft tissues: Paravertebral edema eccentric to the right at the level of T7 where there is a posterior mediastinal masslike area measuring 24 x 8 mm on axial slices, suspicious for phlegmon or abscess in this setting. Disc levels: Tubular vertically oriented ventral epidural material behind the T8 body could be disc extrusion or infection. Small noncompressive disc protrusions are seen at T4-5, T5-6, and T9-10. IMPRESSION: 1. 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 2. Noncompressive ventral epidural material at T7-8 to T8-9 which could be infectious or from disc extrusion. Electronically Signed   By: Monte Fantasia M.D.   On: 09/16/2018 05:46   Mr Lumbar Spine Wo Contrast  Result Date: 09/16/2018 CLINICAL DATA:  Mid to lower back pain and flank pain. History of IV drug abuse EXAM: MRI LUMBAR SPINE WITHOUT CONTRAST TECHNIQUE: Multiplanar, multisequence MR imaging of the lumbar spine was performed. No intravenous contrast was administered. COMPARISON:  None. FINDINGS: Segmentation:  Standard Alignment:  Normal Vertebrae:  No fracture, evidence of discitis, or bone lesion. Conus medullaris and cauda equina: Conus extends to the L1 level. Conus and cauda equina appear normal. Paraspinal and other soft tissues: Negative Disc levels: L1-2 and L2-3 small noncompressive disc protrusions. IMPRESSION: 1. No evidence of lumbar spine infection. 2. Mild disc degeneration at L1-2 and L2-3. Electronically Signed   By: Monte Fantasia M.D.   On: 09/16/2018 05:48   Ct Abdomen Pelvis W Contrast  Result Date: 09/16/2018 CLINICAL DATA:  Abdominal pain EXAM: CT ABDOMEN  AND PELVIS WITH CONTRAST TECHNIQUE: Multidetector CT imaging of  the abdomen and pelvis was performed using the standard protocol following bolus administration of intravenous contrast. CONTRAST:  156m OMNIPAQUE IOHEXOL 300 MG/ML  SOLN COMPARISON:  07/07/2016 FINDINGS: Lower chest: Linear areas of atelectasis or scarring in the lower lungs. Heart is normal size. No effusions. Hepatobiliary: Liver is enlarged with a craniocaudal length of 26 cm. Mild periportal edema. Findings are similar to prior study. No focal hepatic abnormality. Pancreas: No focal abnormality or ductal dilatation. Spleen: Spleen is upper limits normal in size. No focal abnormality. Adrenals/Urinary Tract: No adrenal abnormality. No focal renal abnormality. No stones or hydronephrosis. Urinary bladder is unremarkable. Stomach/Bowel: Normal appendix. Moderate stool burden throughout the colon. No evidence of bowel obstruction. Vascular/Lymphatic: No evidence of aneurysm or adenopathy. Prominent left gonadal vein and veins within the pelvis, likely related to gonadal vein reflux and can be seen with pelvic congestion syndrome. Reproductive: Uterus and adnexa unremarkable.  No mass. Other: No free fluid or free air. Musculoskeletal: No acute bony abnormality. IMPRESSION: Hepatomegaly. Mild periportal edema. Findings are similar to prior study. Prominent periuterine vessels and left gonadal vein, likely related to gonadal vein reflux. This can be associated with pelvic congestion syndrome. Linear areas of atelectasis or scarring in the lung bases. Electronically Signed   By: KRolm BaptiseM.D.   On: 09/16/2018 01:09   [place]kg Site Rite  Result Date: 09/16/2018 If Site Rite image not attached, placement could not be confirmed due to current cardiac rhythm.    LOS: 1 day   Time spent: More than 50% of that time was spent in counseling and/or coordination of care.  RAntonieta Pert MD [facility]  09/17/2018, 9:57 AM

## 2018-09-17 NOTE — Discharge Summary (Addendum)
Physician Discharge Summary  Anna Colon BJY:[id number] DOB: 1981-03-05 DOA: 09/15/2018  PCP: Patient, No Pcp Per  Admit date: 09/15/2018 Discharge date: 09/17/2018  Admitted From: home Disposition:  AMA  Recommendations for Outpatient Follow-up:  1. Follow up with PCP today, return to ER FOR IV ANTIBIOTICS 2.  Brief/Interim Summary: Please refer to my progress note done this morning. Briefly patient was admitted for back pain found to have possible fluid collection vertebral infection underwent aspiration of the back. Patient was instructed to stay in the hospital complete IV antibiotics, was being seen by neurosurgery, infectious disease and radiology. This morning patient decided to leave AGAINST MEDICAL ADVICE like her previous time.  I saw the patient she was alert awake oriented x4, able to make decisions for herself, verbalize the consequences of leaving AMA which include but not limited to worsening infection, loss of ability to ambulate or even death. Patient reported " I have to go out to take care of this business and I will be right back in few hours to the emergency room for iv antibiotics" I did offer her medications however patient left AGAINST MEDICAL ADVICE.  Discharge Diagnoses:  Principal Problem:   Thoracic discitis Active Problems:   IVDU (intravenous drug user)   Cigarette smoker   History of bacterial endocarditis   Polysubstance (excluding opioids) dependence, daily use [facility])    Discharge Instructions Please return to the ER for iv antibiotics  No Known Allergies  Consultations:  ID  IR  Neuro surgery   Procedures/Studies: Dg Chest 2 View  Result Date: 09/16/2018 CLINICAL DATA:  Fever and back pain EXAM: CHEST - 2 VIEW COMPARISON:  None. FINDINGS: Mild interstitial opacities. No focal consolidation. Normal cardiomediastinal contours. No pneumothorax or sizable pleural effusion. IMPRESSION: Chronic bronchitic changes without acute airspace  disease. Electronically Signed   By: Deatra Robinson M.D.   On: 09/16/2018 06:08   Mr Thoracic Spine Wo Contrast  Result Date: 09/16/2018 CLINICAL DATA:  Mid to lower back and flank pain for 4 days. IV drug abuse EXAM: MRI THORACIC SPINE WITHOUT CONTRAST TECHNIQUE: Multiplanar, multisequence MR imaging of the thoracic spine was performed. No intravenous contrast was administered. COMPARISON:  Chest CT 07/29/2016 FINDINGS: Alignment:  Normal Vertebrae: Subtle decreased signal within the T7 vertebral body marrow. No fracture. No acute endplate erosion or disc edema. Cord:  Normal signal and morphology. Paraspinal and other soft tissues: Paravertebral edema eccentric to the right at the level of T7 where there is a posterior mediastinal masslike area measuring 24 x 8 mm on axial slices, suspicious for phlegmon or abscess in this setting. Disc levels: Tubular vertically oriented ventral epidural material behind the T8 body could be disc extrusion or infection. Small noncompressive disc protrusions are seen at T4-5, T5-6, and T9-10. IMPRESSION: 1. 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 2. Noncompressive ventral epidural material at T7-8 to T8-9 which could be infectious or from disc extrusion. Electronically Signed   By: Marnee Spring M.D.   On: 09/16/2018 05:46   Mr Lumbar Spine Wo Contrast  Result Date: 09/16/2018 CLINICAL DATA:  Mid to lower back pain and flank pain. History of IV drug abuse EXAM: MRI LUMBAR SPINE WITHOUT CONTRAST TECHNIQUE: Multiplanar, multisequence MR imaging of the lumbar spine was performed. No intravenous contrast was administered. COMPARISON:  None. FINDINGS: Segmentation:  Standard Alignment:  Normal Vertebrae:  No fracture, evidence of discitis, or bone lesion. Conus medullaris and cauda equina: Conus extends  to the L1 level. Conus and cauda equina appear normal. Paraspinal and other soft tissues: Negative  Disc levels: L1-2 and L2-3 small noncompressive disc protrusions. IMPRESSION: 1. No evidence of lumbar spine infection. 2. Mild disc degeneration at L1-2 and L2-3. Electronically Signed   By: Marnee Spring M.D.   On: 09/16/2018 05:48   Ct Abdomen Pelvis W Contrast  Result Date: 09/16/2018 CLINICAL DATA:  Abdominal pain EXAM: CT ABDOMEN AND PELVIS WITH CONTRAST TECHNIQUE: Multidetector CT imaging of the abdomen and pelvis was performed using the standard protocol following bolus administration of intravenous contrast. CONTRAST:  OMNIPAQUE IOHEXOL 300 MG/ML  SOLN COMPARISON:  07/07/2016 FINDINGS: Lower chest: Linear areas of atelectasis or scarring in the lower lungs. Heart is normal size. No effusions. Hepatobiliary: Liver is enlarged with a craniocaudal length of 26 cm. Mild periportal edema. Findings are similar to prior study. No focal hepatic abnormality. Pancreas: No focal abnormality or ductal dilatation. Spleen: Spleen is upper limits normal in size. No focal abnormality. Adrenals/Urinary Tract: No adrenal abnormality. No focal renal abnormality. No stones or hydronephrosis. Urinary bladder is unremarkable. Stomach/Bowel: Normal appendix. Moderate stool burden throughout the colon. No evidence of bowel obstruction. Vascular/Lymphatic: No evidence of aneurysm or adenopathy. Prominent left gonadal vein and veins within the pelvis, likely related to gonadal vein reflux and can be seen with pelvic congestion syndrome. Reproductive: Uterus and adnexa unremarkable.  No mass. Other: No free fluid or free air. Musculoskeletal: No acute bony abnormality. IMPRESSION: Hepatomegaly. Mild periportal edema. Findings are similar to prior study. Prominent periuterine vessels and left gonadal vein, likely related to gonadal vein reflux. This can be associated with pelvic congestion syndrome. Linear areas of atelectasis or scarring in the lung bases. Electronically Signed   By: Charlett Nose M.D.   On: 09/16/2018  01:09   [place] Ekg Site Rite  Result Date: 09/16/2018 If Site Rite image not attached, placement could not be confirmed due to current cardiac rhythm.    Discharge Exam: Vitals:   09/17/18 1023 09/17/18 1036  BP: (!) 131/91 (!) 139/91  Pulse: 84 73  Resp: (!) 22 18  Temp:    SpO2: 100% 98%   Vitals:   09/17/18 1010 09/17/18 1015 09/17/18 1023 09/17/18 1036  BP: 132/84 133/75 (!) 131/91 (!) 139/91  Pulse: 84 76 84 73  Resp: 20 20 (!) 22 18  Temp:      TempSrc:      SpO2: 99% 100% 100% 98%  Weight:      Height:        General: Pt is alert, awake, not in acute distress Cardiovascular: RRR, S1/S2 +, no rubs, no gallops Respiratory: CTA bilaterally, no wheezing, no rhonchi Abdominal: Soft, NT, ND, bowel sounds + Extremities: no edema, no cyanosis   The results of significant diagnostics from this hospitalization (including imaging, microbiology, ancillary and laboratory) are listed below for reference.     Microbiology: Recent Results (from the past 240 hour(s))  Blood Culture (routine x 2)     Status: Abnormal (Preliminary result)   Collection Time: 09/16/18 12:12 AM  Result Value Ref Range Status   Specimen Description   Final    BLOOD LEFT ARM Performed at [facility], [address] [place], [place] [address]    Special Requests   Final    BOTTLES DRAWN AEROBIC AND ANAEROBIC Blood Culture adequate volume Performed at [facility], [address], [place], [place] [address]    Culture  Setup Time   Final    GRAM NEGATIVE RODS IN BOTH AEROBIC AND ANAEROBIC BOTTLES CRITICAL RESULT CALLED TO, READ BACK BY AND VERIFIED WITH: C  AMEND PHARMD 09/17/18 0109 JDW    Culture (A)  Final    SERRATIA MARCESCENS SUSCEPTIBILITIES TO FOLLOW Performed at [facility], [address], [place], [place] [address]    Report Status PENDING  Incomplete  Blood Culture ID Panel (Reflexed)     Status: Abnormal   Collection Time: 09/16/18 12:12 AM  Result  Value Ref Range Status   Enterococcus species NOT DETECTED NOT DETECTED Final   Listeria monocytogenes NOT DETECTED NOT DETECTED Final   Staphylococcus species NOT DETECTED NOT DETECTED Final   Staphylococcus aureus (BCID) NOT DETECTED NOT DETECTED Final   Streptococcus species NOT DETECTED NOT DETECTED Final   Streptococcus agalactiae NOT DETECTED NOT DETECTED Final   Streptococcus pneumoniae NOT DETECTED NOT DETECTED Final   Streptococcus pyogenes NOT DETECTED NOT DETECTED Final   Acinetobacter baumannii NOT DETECTED NOT DETECTED Final   Enterobacteriaceae species DETECTED (A) NOT DETECTED Final    Comment: Enterobacteriaceae represent a large family of gram-negative bacteria, not a single organism. CRITICAL RESULT CALLED TO, READ BACK BY AND VERIFIED WITH: C AMEND PHARMD 09/17/18 0107  JDW    Enterobacter cloacae complex NOT DETECTED NOT DETECTED Final   Escherichia coli NOT DETECTED NOT DETECTED Final   Klebsiella oxytoca NOT DETECTED NOT DETECTED Final   Klebsiella pneumoniae NOT DETECTED NOT DETECTED Final   Proteus species NOT DETECTED NOT DETECTED Final   Serratia marcescens DETECTED (A) NOT DETECTED Final    Comment: CRITICAL RESULT CALLED TO, READ BACK BY AND VERIFIED WITH: C AMEND PHARMD 09/17/18 0107 JDW    Carbapenem resistance NOT DETECTED NOT DETECTED Final   Haemophilus influenzae NOT DETECTED NOT DETECTED Final   Neisseria meningitidis NOT DETECTED NOT DETECTED Final   Pseudomonas aeruginosa NOT DETECTED NOT DETECTED Final   Candida albicans NOT DETECTED NOT DETECTED Final   Candida glabrata NOT DETECTED NOT DETECTED Final   Candida krusei NOT DETECTED NOT DETECTED Final   Candida parapsilosis NOT DETECTED NOT DETECTED Final   Candida tropicalis NOT DETECTED NOT DETECTED Final    Comment: Performed at [facility], [address] [place], [place] [address]  MRSA PCR Screening     Status: Abnormal   Collection Time: 09/16/18  8:40 AM  Result Value Ref Range  Status   MRSA by PCR POSITIVE (A) NEGATIVE Final    Comment:        The GeneXpert MRSA Assay (FDA approved for NASAL specimens only), is one component of a comprehensive MRSA colonization surveillance program. It is not intended to diagnose MRSA infection nor to guide or monitor treatment for MRSA infections. RESULT CALLED TO, READ BACK BY AND VERIFIED WITH: O. Purvis Sheffield RN 12:35 09/16/18 ([email or web address]) Performed at [facility], [address] [place], [place] [address]      Labs: BNP (last 3 results) No results for input(s): BNP in the last 8760 hours. Basic Metabolic Panel: Recent Labs  Lab 09/16/18 0012 09/17/18 0726  NA 131* 135  K 3.6 3.4*  CL 98 102  CO2 23 23  GLUCOSE 121* 115*  BUN 12 11  CREATININE 0.80 0.63  CALCIUM 9.1 8.9   Liver Function Tests: Recent Labs  Lab 09/16/18 0012  AST 25  ALT 18  ALKPHOS 98  BILITOT 0.6  PROT 8.0  ALBUMIN 3.6   Recent  Labs  Lab 09/16/18 0012  LIPASE 21   No results for input(s): AMMONIA in the last 168 hours. CBC: Recent Labs  Lab 09/16/18 0012 09/17/18 0726  WBC 14.1* 8.7  NEUTROABS 11.4*  --   HGB 12.4 10.5*  HCT 41.6 33.7*  MCV 84.4 79.9*  PLT 228 204   Cardiac Enzymes: No results for input(s): CKTOTAL, CKMB, CKMBINDEX, TROPONINI in the last 168 hours. BNP: Invalid input(s): POCBNP CBG: No results for input(s): GLUCAP in the last 168 hours. D-Dimer No results for input(s): DDIMER in the last 72 hours. Hgb A1c Recent Labs    09/16/18 1349  HGBA1C 5.7*   Lipid Profile No results for input(s): CHOL, HDL, LDLCALC, TRIG, CHOLHDL, LDLDIRECT in the last 72 hours. Thyroid function studies No results for input(s): TSH, T4TOTAL, T3FREE, THYROIDAB in the last 72 hours.  Invalid input(s): FREET3 Anemia work up No results for input(s): VITAMINB12, FOLATE, FERRITIN, TIBC, IRON, RETICCTPCT in the last 72 hours. Urinalysis    Component Value Date/Time   COLORURINE YELLOW 09/15/2018 2342    APPEARANCEUR CLEAR 09/15/2018 2342   LABSPEC 1.020 09/15/2018 2342   PHURINE 6.0 09/15/2018 2342   GLUCOSEU NEGATIVE 09/15/2018 2342   HGBUR SMALL (A) 09/15/2018 2342   BILIRUBINUR SMALL (A) 09/15/2018 2342   KETONESUR 15 (A) 09/15/2018 2342   PROTEINUR NEGATIVE 09/15/2018 2342   NITRITE NEGATIVE 09/15/2018 2342   LEUKOCYTESUR NEGATIVE 09/15/2018 2342   Sepsis Labs Invalid input(s): PROCALCITONIN,  WBC,  LACTICIDVEN Microbiology Recent Results (from the past 240 hour(s))  Blood Culture (routine x 2)     Status: Abnormal (Preliminary result)   Collection Time: 09/16/18 12:12 AM  Result Value Ref Range Status   Specimen Description   Final    BLOOD LEFT ARM Performed at [facility], [address] [place], [place] [address]    Special Requests   Final    BOTTLES DRAWN AEROBIC AND ANAEROBIC Blood Culture adequate volume Performed at [facility], [address] [place], [place] [address]    Culture  Setup Time   Final    GRAM NEGATIVE RODS IN BOTH AEROBIC AND ANAEROBIC BOTTLES CRITICAL RESULT CALLED TO, READ BACK BY AND VERIFIED WITH: C  AMEND PHARMD 09/17/18 0109 JDW    Culture (A)  Final    SERRATIA MARCESCENS SUSCEPTIBILITIES TO FOLLOW Performed at [facility], [address] [place], [place] [address]    Report Status PENDING  Incomplete  Blood Culture ID Panel (Reflexed)     Status: Abnormal   Collection Time: 09/16/18 12:12 AM  Result Value Ref Range Status   Enterococcus species NOT DETECTED NOT DETECTED Final   Listeria monocytogenes NOT DETECTED NOT DETECTED Final   Staphylococcus species NOT DETECTED NOT DETECTED Final   Staphylococcus aureus (BCID) NOT DETECTED NOT DETECTED Final   Streptococcus species NOT DETECTED NOT DETECTED Final   Streptococcus agalactiae NOT DETECTED NOT DETECTED Final   Streptococcus pneumoniae NOT DETECTED NOT DETECTED Final   Streptococcus pyogenes NOT DETECTED NOT DETECTED Final   Acinetobacter  baumannii NOT DETECTED NOT DETECTED Final   Enterobacteriaceae species DETECTED (A) NOT DETECTED Final    Comment: Enterobacteriaceae represent a large family of gram-negative bacteria, not a single organism. CRITICAL RESULT CALLED TO, READ BACK BY AND VERIFIED WITH: C AMEND PHARMD 09/17/18 0107  JDW    Enterobacter cloacae complex NOT DETECTED NOT DETECTED Final   Escherichia coli NOT DETECTED NOT DETECTED Final   Klebsiella oxytoca  NOT DETECTED NOT DETECTED Final   Klebsiella pneumoniae NOT DETECTED NOT DETECTED Final   Proteus species NOT DETECTED NOT DETECTED Final   Serratia marcescens DETECTED (A) NOT DETECTED Final    Comment: CRITICAL RESULT CALLED TO, READ BACK BY AND VERIFIED WITH: C AMEND PHARMD 09/17/18 0107 JDW    Carbapenem resistance NOT DETECTED NOT DETECTED Final   Haemophilus influenzae NOT DETECTED NOT DETECTED Final   Neisseria meningitidis NOT DETECTED NOT DETECTED Final   Pseudomonas aeruginosa NOT DETECTED NOT DETECTED Final   Candida albicans NOT DETECTED NOT DETECTED Final   Candida glabrata NOT DETECTED NOT DETECTED Final   Candida krusei NOT DETECTED NOT DETECTED Final   Candida parapsilosis NOT DETECTED NOT DETECTED Final   Candida tropicalis NOT DETECTED NOT DETECTED Final    Comment: Performed at [facility], [address] [place], [place] [address]  MRSA PCR Screening     Status: Abnormal   Collection Time: 09/16/18  8:40 AM  Result Value Ref Range Status   MRSA by PCR POSITIVE (A) NEGATIVE Final    Comment:        The GeneXpert MRSA Assay (FDA approved for NASAL specimens only), is one component of a comprehensive MRSA colonization surveillance program. It is not intended to diagnose MRSA infection nor to guide or monitor treatment for MRSA infections. RESULT CALLED TO, READ BACK BY AND VERIFIED WITH: O. Purvis Sheffield RN 12:35 09/16/18 ([email or web address]) Performed at [facility], [address] [place], [place] [address]      Time  coordinating discharge:  25 minutes  SIGNED:   Lanae Boast, MD  [facility] 09/17/2018, 11:55 AM  If 7PM-7AM, please contact night-coverage [email or web address]

## 2018-09-17 NOTE — Progress Notes (Signed)
Pt left hospital AMA. No further needs per CM.

## 2018-09-17 NOTE — Sedation Documentation (Signed)
Patient unable to lay flat on CT table. Patient reports no pain relief.

## 2018-09-17 NOTE — Procedures (Signed)
  Procedure: CT aspiration R T7 paravertebral phlegmon scant bloody fluid, for GS, C&S EBL:   minimal Complications:  none immediate  See full dictation in [employer].  Thora Lance MD Main # [phone] Pager  [phone]

## 2018-09-17 NOTE — Progress Notes (Signed)
Pt left AMA. Nurse provided education on the risk of leaving AMA. Pt verbalize understanding, signed AMA form and left. IV D/C, and escorted to the elevator by RN. MD notified

## 2018-09-17 NOTE — Progress Notes (Signed)
PHARMACY - PHYSICIAN COMMUNICATION CRITICAL VALUE ALERT - BLOOD CULTURE IDENTIFICATION (BCID)  Anna Colon is an 38 y.o. female who presented to [facility] on 09/15/2018 with a chief complaint of back pain, thoracic discitis  Assessment:  Pt growing serratia marcescens (no KPC detected) in blood cultures. Suspected source is thoracic discitis.  Name of physician (or Provider) Contacted: Merdis Delay, NP  Current antibiotics: Vancomycin  Changes to prescribed antibiotics recommended:  Adding Rocephin 2gm IV q24h. ID already following patient so NP will let them decide about d/c Vanc.  Results for orders placed or performed during the hospital encounter of 09/15/18  Blood Culture ID Panel (Reflexed) (Collected: 09/16/2018 12:12 AM)  Result Value Ref Range   Enterococcus species NOT DETECTED NOT DETECTED   Listeria monocytogenes NOT DETECTED NOT DETECTED   Staphylococcus species NOT DETECTED NOT DETECTED   Staphylococcus aureus (BCID) NOT DETECTED NOT DETECTED   Streptococcus species NOT DETECTED NOT DETECTED   Streptococcus agalactiae NOT DETECTED NOT DETECTED   Streptococcus pneumoniae NOT DETECTED NOT DETECTED   Streptococcus pyogenes NOT DETECTED NOT DETECTED   Acinetobacter baumannii NOT DETECTED NOT DETECTED   Enterobacteriaceae species DETECTED (A) NOT DETECTED   Enterobacter cloacae complex NOT DETECTED NOT DETECTED   Escherichia coli NOT DETECTED NOT DETECTED   Klebsiella oxytoca NOT DETECTED NOT DETECTED   Klebsiella pneumoniae NOT DETECTED NOT DETECTED   Proteus species NOT DETECTED NOT DETECTED   Serratia marcescens DETECTED (A) NOT DETECTED   Carbapenem resistance NOT DETECTED NOT DETECTED   Haemophilus influenzae NOT DETECTED NOT DETECTED   Neisseria meningitidis NOT DETECTED NOT DETECTED   Pseudomonas aeruginosa NOT DETECTED NOT DETECTED   Candida albicans NOT DETECTED NOT DETECTED   Candida glabrata NOT DETECTED NOT DETECTED   Candida krusei NOT DETECTED NOT  DETECTED   Candida parapsilosis NOT DETECTED NOT DETECTED   Candida tropicalis NOT DETECTED NOT DETECTED    Christoper Fabian, PharmD, BCPS Clinical pharmacist  **Pharmacist phone directory can now be found on [email or web address] ([email or web address]).  Listed under [facility]. 09/17/2018  1:46 AM

## 2018-09-18 LAB — CULTURE, BLOOD (ROUTINE X 2): Special Requests: ADEQUATE

## 2018-09-18 LAB — QUANTIFERON-TB GOLD PLUS (RQFGPL)
QuantiFERON Mitogen Value: 3.64 IU/mL
QuantiFERON Nil Value: 0.03 IU/mL
QuantiFERON TB1 Ag Value: 0.04 IU/mL
QuantiFERON TB2 Ag Value: 0.04 IU/mL

## 2018-09-18 LAB — QUANTIFERON-TB GOLD PLUS: QuantiFERON-TB Gold Plus: NEGATIVE

## 2018-09-22 LAB — AEROBIC/ANAEROBIC CULTURE W GRAM STAIN (SURGICAL/DEEP WOUND)
Culture: NO GROWTH
Gram Stain: NONE SEEN

## 2019-07-30 ENCOUNTER — Emergency Department (HOSPITAL_BASED_OUTPATIENT_CLINIC_OR_DEPARTMENT_OTHER): Payer: Self-pay

## 2019-07-30 ENCOUNTER — Encounter (HOSPITAL_BASED_OUTPATIENT_CLINIC_OR_DEPARTMENT_OTHER): Payer: Self-pay | Admitting: *Deleted

## 2019-07-30 ENCOUNTER — Other Ambulatory Visit: Payer: Self-pay

## 2019-07-30 ENCOUNTER — Inpatient Hospital Stay (HOSPITAL_BASED_OUTPATIENT_CLINIC_OR_DEPARTMENT_OTHER)
Admission: EM | Admit: 2019-07-30 | Discharge: 2019-08-01 | DRG: 603 | Disposition: A | Discharge status: Home / Self Care | Payer: Self-pay | Attending: Internal Medicine | Admitting: Internal Medicine

## 2019-07-30 DIAGNOSIS — F17210 Nicotine dependence, cigarettes, uncomplicated: Secondary | ICD-10-CM | POA: Diagnosis present

## 2019-07-30 DIAGNOSIS — Z79899 Other long term (current) drug therapy: Secondary | ICD-10-CM

## 2019-07-30 DIAGNOSIS — D696 Thrombocytopenia, unspecified: Secondary | ICD-10-CM | POA: Diagnosis present

## 2019-07-30 DIAGNOSIS — Z8614 Personal history of Methicillin resistant Staphylococcus aureus infection: Secondary | ICD-10-CM

## 2019-07-30 DIAGNOSIS — J45909 Unspecified asthma, uncomplicated: Secondary | ICD-10-CM | POA: Diagnosis present

## 2019-07-30 DIAGNOSIS — L03119 Cellulitis of unspecified part of limb: Secondary | ICD-10-CM

## 2019-07-30 DIAGNOSIS — B1920 Unspecified viral hepatitis C without hepatic coma: Secondary | ICD-10-CM | POA: Diagnosis present

## 2019-07-30 DIAGNOSIS — F1120 Opioid dependence, uncomplicated: Secondary | ICD-10-CM | POA: Diagnosis present

## 2019-07-30 DIAGNOSIS — F1910 Other psychoactive substance abuse, uncomplicated: Secondary | ICD-10-CM | POA: Diagnosis present

## 2019-07-30 DIAGNOSIS — Z20822 Contact with and (suspected) exposure to covid-19: Secondary | ICD-10-CM | POA: Diagnosis present

## 2019-07-30 DIAGNOSIS — Z791 Long term (current) use of non-steroidal anti-inflammatories (NSAID): Secondary | ICD-10-CM

## 2019-07-30 DIAGNOSIS — R768 Other specified abnormal immunological findings in serum: Secondary | ICD-10-CM | POA: Diagnosis present

## 2019-07-30 DIAGNOSIS — L03115 Cellulitis of right lower limb: Principal | ICD-10-CM | POA: Diagnosis present

## 2019-07-30 DIAGNOSIS — F1990 Other psychoactive substance use, unspecified, uncomplicated: Secondary | ICD-10-CM | POA: Diagnosis present

## 2019-07-30 DIAGNOSIS — F1920 Other psychoactive substance dependence, uncomplicated: Secondary | ICD-10-CM | POA: Diagnosis present

## 2019-07-30 LAB — CBC WITH DIFFERENTIAL/PLATELET
Abs Immature Granulocytes: 0.05 10*3/uL (ref 0.00–0.07)
Basophils Absolute: 0 10*3/uL (ref 0.0–0.1)
Basophils Relative: 0 %
Eosinophils Absolute: 0 10*3/uL (ref 0.0–0.5)
Eosinophils Relative: 0 %
HCT: 38.8 % (ref 36.0–46.0)
Hemoglobin: 12.2 g/dL (ref 12.0–15.0)
Immature Granulocytes: 1 %
Lymphocytes Relative: 17 %
Lymphs Abs: 1.5 10*3/uL (ref 0.7–4.0)
MCH: 27.1 pg (ref 26.0–34.0)
MCHC: 31.4 g/dL (ref 30.0–36.0)
MCV: 86.2 fL (ref 80.0–100.0)
Monocytes Absolute: 0.6 10*3/uL (ref 0.1–1.0)
Monocytes Relative: 7 %
Neutro Abs: 6.8 10*3/uL (ref 1.7–7.7)
Neutrophils Relative %: 75 %
Platelets: 207 10*3/uL (ref 150–400)
RBC: 4.5 MIL/uL (ref 3.87–5.11)
RDW: 14.1 % (ref 11.5–15.5)
WBC: 9 10*3/uL (ref 4.0–10.5)
nRBC: 0 % (ref 0.0–0.2)

## 2019-07-30 LAB — BASIC METABOLIC PANEL
Anion gap: 7 (ref 5–15)
BUN: 11 mg/dL (ref 6–20)
CO2: 25 mmol/L (ref 22–32)
Calcium: 9 mg/dL (ref 8.9–10.3)
Chloride: 105 mmol/L (ref 98–111)
Creatinine, Ser: 0.74 mg/dL (ref 0.44–1.00)
GFR calc Af Amer: >60 mL/min (ref >60)
GFR calc non Af Amer: >60 mL/min (ref >60)
Glucose, Bld: 93 mg/dL (ref 70–99)
Potassium: 3.9 mmol/L (ref 3.5–5.1)
Sodium: 137 mmol/L (ref 135–145)

## 2019-07-30 LAB — CBC
HCT: 39.6 % (ref 36.0–46.0)
Hemoglobin: 12.3 g/dL (ref 12.0–15.0)
MCH: 26.7 pg (ref 26.0–34.0)
MCHC: 31.1 g/dL (ref 30.0–36.0)
MCV: 86.1 fL (ref 80.0–100.0)
Platelets: 194 10*3/uL (ref 150–400)
RBC: 4.6 MIL/uL (ref 3.87–5.11)
RDW: 14.1 % (ref 11.5–15.5)
WBC: 8.6 10*3/uL (ref 4.0–10.5)
nRBC: 0 % (ref 0.0–0.2)

## 2019-07-30 LAB — CREATININE, SERUM
Creatinine, Ser: 0.58 mg/dL (ref 0.44–1.00)
GFR calc Af Amer: >60 mL/min (ref >60)
GFR calc non Af Amer: >60 mL/min (ref >60)

## 2019-07-30 MED ORDER — SODIUM CHLORIDE 0.9 % IV SOLN: INTRAVENOUS | Status: DC

## 2019-07-30 MED ORDER — VANCOMYCIN HCL 750 MG/150ML IV SOLN
750.0000 mg | Freq: Two times a day (BID) | INTRAVENOUS | Status: DC
  Administered 2019-07-31 – 2019-08-01 (×3): 750 mg via INTRAVENOUS
  Filled 2019-07-30 (×4): qty 150

## 2019-07-30 MED ORDER — ACETAMINOPHEN 325 MG PO TABS: 650.0000 mg | ORAL_TABLET | Freq: Four times a day (QID) | ORAL | Status: DC | PRN

## 2019-07-30 MED ORDER — ACETAMINOPHEN 650 MG RE SUPP: 650.0000 mg | Freq: Four times a day (QID) | RECTAL | Status: DC | PRN

## 2019-07-30 MED ORDER — ENOXAPARIN SODIUM 40 MG/0.4ML ~~LOC~~ SOLN
40.0000 mg | SUBCUTANEOUS | Status: DC
  Administered 2019-07-30 – 2019-07-31 (×2): 40 mg via SUBCUTANEOUS
  Filled 2019-07-30 (×2): qty 0.4

## 2019-07-30 MED ORDER — SODIUM CHLORIDE 0.9 % IV SOLN: INTRAVENOUS | Status: DC | PRN

## 2019-07-30 MED ORDER — ONDANSETRON HCL 4 MG PO TABS: 4.0000 mg | ORAL_TABLET | Freq: Four times a day (QID) | ORAL | Status: DC | PRN

## 2019-07-30 MED ORDER — MORPHINE SULFATE (PF) 4 MG/ML IV SOLN
4.0000 mg | Freq: Once | INTRAVENOUS | Status: AC
  Administered 2019-07-30: 4 mg via INTRAVENOUS
  Filled 2019-07-30: qty 1

## 2019-07-30 MED ORDER — VANCOMYCIN HCL IN DEXTROSE 1-5 GM/200ML-% IV SOLN
1000.0000 mg | Freq: Once | INTRAVENOUS | Status: AC
  Administered 2019-07-30: 1000 mg via INTRAVENOUS
  Filled 2019-07-30: qty 200

## 2019-07-30 MED ORDER — ONDANSETRON HCL 4 MG/2ML IJ SOLN
4.0000 mg | Freq: Four times a day (QID) | INTRAMUSCULAR | Status: DC | PRN
  Administered 2019-07-31 – 2019-08-01 (×2): 4 mg via INTRAVENOUS
  Filled 2019-07-30 (×2): qty 2

## 2019-07-30 MED ORDER — MORPHINE SULFATE (PF) 2 MG/ML IV SOLN
2.0000 mg | INTRAVENOUS | Status: DC | PRN
  Administered 2019-07-30: 2 mg via INTRAVENOUS
  Filled 2019-07-30: qty 1

## 2019-07-30 MED ORDER — KETOROLAC TROMETHAMINE 30 MG/ML IJ SOLN
30.0000 mg | Freq: Four times a day (QID) | INTRAMUSCULAR | Status: DC
  Administered 2019-07-30 – 2019-08-01 (×6): 30 mg via INTRAVENOUS
  Filled 2019-07-30 (×6): qty 1

## 2019-07-30 NOTE — Progress Notes (Signed)
Pharmacy Antibiotic Note  Anna Colon is Anna 39 y.o. female admitted on 07/30/2019 with cellulitis.  Pharmacy has been consulted for vancomycin dosing.  Plan: Vancomycin 1 gram x1 (waiting for lab results), followed by 750 mg IV every 12 hours - expected to provide AUC of ~ 500 mcg  Height: 5\' 2"  (157.5 cm) Weight: 130 lb (59 kg) IBW/kg (Calculated) : 50.1  Temp (24hrs), Avg:98.7 F (37.1 C), Min:98.7 F (37.1 C), Max:98.7 F (37.1 C)  Recent Labs  Lab 07/30/19 1538  WBC 9.0  CREATININE 0.74    Estimated Creatinine Clearance: 73.9 mL/min (by C-G formula based on SCr of 0.74 mg/dL).    No Known Allergies  Antimicrobials this admission: Vancomycin 1 gram x 1   Microbiology results: None at this time  Thank you for allowing pharmacy to be Anna part of this patient's care.  08/01/19 Anna Colon 07/30/2019 4:05 PM

## 2019-07-30 NOTE — ED Notes (Signed)
Admitting MD aware that BC x 2 were obtained post abx administration

## 2019-07-30 NOTE — ED Triage Notes (Signed)
Pt c/o right foot swelling and redness x 4 days. HX iv drug use

## 2019-07-30 NOTE — Progress Notes (Addendum)
rm 1315, Anna Colon, 38yoF in w/ cellulitis of rt foot, Stated she last used heroine today. Stated can she have meds for withdrawls.

## 2019-07-30 NOTE — ED Provider Notes (Signed)
Medical screening examination/treatment/procedure(s) were conducted as a shared visit with non-physician practitioner(s) and myself.  I personally evaluated the patient during the encounter.      Results for orders placed or performed during the hospital encounter of 07/30/19  CBC with Differential  Result Value Ref Range   WBC 9.0 4.0 - 10.5 K/uL   RBC 4.50 3.87 - 5.11 MIL/uL   Hemoglobin 12.2 12.0 - 15.0 g/dL   HCT 00.5 11.0 - 21.1 %   MCV 86.2 80.0 - 100.0 fL   MCH 27.1 26.0 - 34.0 pg   MCHC 31.4 30.0 - 36.0 g/dL   RDW 17.3 56.7 - 01.4 %   Platelets 207 150 - 400 K/uL   nRBC 0.0 0.0 - 0.2 %   Neutrophils Relative % 75 %   Neutro Abs 6.8 1.7 - 7.7 K/uL   Lymphocytes Relative 17 %   Lymphs Abs 1.5 0.7 - 4.0 K/uL   Monocytes Relative 7 %   Monocytes Absolute 0.6 0.1 - 1.0 K/uL   Eosinophils Relative 0 %   Eosinophils Absolute 0.0 0.0 - 0.5 K/uL   Basophils Relative 0 %   Basophils Absolute 0.0 0.0 - 0.1 K/uL   Immature Granulocytes 1 %   Abs Immature Granulocytes 0.05 0.00 - 0.07 K/uL  Basic metabolic panel  Result Value Ref Range   Sodium 137 135 - 145 mmol/L   Potassium 3.9 3.5 - 5.1 mmol/L   Chloride 105 98 - 111 mmol/L   CO2 25 22 - 32 mmol/L   Glucose, Bld 93 70 - 99 mg/dL   BUN 11 6 - 20 mg/dL   Creatinine, Ser 1.03 0.44 - 1.00 mg/dL   Calcium 9.0 8.9 - 01.3 mg/dL   GFR calc non Af Amer >60 >60 mL/min   GFR calc Af Amer >60 >60 mL/min   Anion gap 7 5 - 15   DG Foot Complete Right  Result Date: 07/30/2019 CLINICAL DATA:  Soft tissue swelling and redness EXAM: RIGHT FOOT COMPLETE - 3+ VIEW COMPARISON:  None. FINDINGS: Frontal, oblique, and lateral views were obtained. There is soft tissue swelling laterally. No radiopaque foreign body or soft tissue air. No fracture or dislocation. Joint spaces appear normal. No erosive change. IMPRESSION: Soft tissue swelling laterally. No fracture or dislocation. No evident arthropathy. Electronically Signed   By: Bretta Bang  III M.D.   On: 07/30/2019 14:05      Patient seen by me along with physician assistant.  Patient presenting with 4-day history of right foot swelling and redness.  Has a history of IV drug abuse.  Denies any injection into that site.  But patient has had problems with emboli questionably in the past.  Not known to have endocarditis but it is possibility is a place of seeding.  Not involving a big joint like the knee or the ankle but no fluid necessarily to be collected here.  Recommend IV antibiotics and admission to make sure he gets better may end up needing an MRI of the foot.  Here not toxic other than heart rate of 101 oxygen sats 100% white blood cell count is 9000 so no significant leukocytosis electrolytes and renal function is normal.      Vanetta Mulders, MD 07/30/19 1642

## 2019-07-30 NOTE — ED Provider Notes (Signed)
[facility] EMERGENCY DEPARTMENT Provider Note   CSN: [id number] Arrival date & time: 07/30/19  1337     History Chief Complaint  Patient presents with  . Foot Swelling    Anna Colon is a 39 y.o. female.  HPI Patient presents to the emergency department with increased pain and swelling to the right foot.  Patient states it started 3 to 4 days ago.  She states that she is a heavy IV drug user but does not inject into her foot.  The patient states that the pain is now affecting the movement of her toes.  The patient states that nothing seems make her condition better but certain movements palpation make the pain worse.  Patient states she did not take any medications prior to arrival for any medicines.  The patient denies chest pain, shortness of breath, headache,blurred vision, neck pain, fever, cough, weakness, numbness, dizziness, anorexia, edema, abdominal pain, nausea, vomiting, diarrhea, rash, back pain, dysuria, hematemesis, bloody stool, near syncope, or syncope.    Past Medical History:  Diagnosis Date  . Asthma   . Hepatitis C   . IVDU (intravenous drug user)     Patient Active Problem List   Diagnosis Date Noted  . Cellulitis of foot 07/30/2019  . 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 Polysubstance (excluding opioids) dependence, daily use (HCC) 09/16/2018  . Back pain   . Pain management   . FUO (fever of unknown origin)   . Polysubstance abuse (HCC)   . Bursitis of right shoulder   . Pain   . Pyogenic arthritis of right shoulder region (HCC)   . Acute pain of right shoulder   . Septic embolism (HCC)   . Pressure injury of skin 07/14/2016  . Acute septic pulmonary embolism without acute cor pulmonale (HCC)   . Sepsis due to methicillin resistant Staphylococcus aureus (MRSA) (HCC)   . History of bacterial endocarditis 07/09/2016  . Hepatitis C antibody test positive 07/08/2016  . Normocytic anemia 07/08/2016  . Thrombocytopenia (HCC) 07/08/2016  .  Sepsis (HCC) 07/07/2016  . MRSA bacteremia 07/07/2016  . Cavitary pneumonia 07/07/2016  . IVDU (intravenous drug user) 07/07/2016  . Cigarette smoker 07/07/2016    Past Surgical History:  Procedure Laterality Date  . CESAREAN SECTION    . MULTIPLE EXTRACTIONS WITH ALVEOLOPLASTY N/A 07/25/2016   Procedure: MULTIPLE EXTRACTION OF TOOTH #'s 18,19 and 21 thru 31  WITH ALVEOLOPLASTY;  Surgeon: Charlynne Pander, DDS;  Location: [facility] OR;  Service: Oral Surgery;  Laterality: N/A;     OB History   No obstetric history on file.     No family history on file.  Social History   Tobacco Use  . Smoking status: Current Every Day Smoker    Packs/day: 1.00    Years: 24.00    Pack years: 24.00  . Smokeless tobacco: Never Used  Substance Use Topics  . Alcohol use: No  . Drug use: Yes    Types: IV, Cocaine, Heroin    Comment: heroin    Home Medications Prior to Admission medications   Medication Sig Start Date End Date Taking? Authorizing Provider  acetaminophen (TYLENOL) 325 MG tablet Take 2 tablets (650 mg total) by mouth every 6 (six) hours as needed for mild pain (or Fever >/= 101). 08/07/16   Maxie Barb, MD  ibuprofen (ADVIL,MOTRIN) 200 MG tablet Take 800 mg by mouth every 6 (six) hours as needed for headache or moderate pain.     [provider]  nicotine (NICODERM CQ - DOSED IN MG/24 HOURS) 21 mg/24hr patch Place 1 patch (21 mg total) onto the skin daily. Patient not taking: Reported on 08/29/2016 08/07/16   Maxie Barb, MD    Allergies    Patient has no known allergies.  Review of Systems   Review of Systems All other systems negative except as documented in the HPI. All pertinent positives and negatives as reviewed in the HPI. Physical Exam Updated Vital Signs BP 128/79 (BP Location: Right Arm)   Pulse 84   Temp 98.9 F (37.2 C) (Oral)   Resp 14   Ht 5\' 2"  (1.575 m)   Wt 59 kg   SpO2 99%   BMI 23.78 kg/m   Physical Exam Vitals and nursing  note reviewed.  Constitutional:      General: She is not in acute distress.    Appearance: She is well-developed.  HENT:     Head: Normocephalic and atraumatic.  Eyes:     Pupils: Pupils are equal, round, and reactive to light.  Cardiovascular:     Rate and Rhythm: Normal rate and regular rhythm.     Heart sounds: Normal heart sounds. No murmur. No friction rub. No gallop.   Pulmonary:     Effort: Pulmonary effort is normal. No respiratory distress.     Breath sounds: Normal breath sounds. No wheezing.  Abdominal:     General: Bowel sounds are normal. There is no distension.     Palpations: Abdomen is soft.     Tenderness: There is no abdominal tenderness.  Musculoskeletal:     Cervical back: Normal range of motion and neck supple.       Feet:  Skin:    General: Skin is warm and dry.     Capillary Refill: Capillary refill takes less than 2 seconds.     Findings: No erythema or rash.  Neurological:     Mental Status: She is alert and oriented to person, place, and time.     Motor: No abnormal muscle tone.     Coordination: Coordination normal.  Psychiatric:        Behavior: Behavior normal.     ED Results / Procedures / Treatments   Labs (all labs ordered are listed, but only abnormal results are displayed) Labs Reviewed  CULTURE, BLOOD (ROUTINE X 2)  CULTURE, BLOOD (ROUTINE X 2)  SARS CORONAVIRUS 2 (TAT 6-24 HRS)  CBC WITH DIFFERENTIAL/PLATELET  BASIC METABOLIC PANEL    EKG None  Radiology DG Foot Complete Right  Result Date: 07/30/2019 CLINICAL DATA:  Soft tissue swelling and redness EXAM: RIGHT FOOT COMPLETE - 3+ VIEW COMPARISON:  None. FINDINGS: Frontal, oblique, and lateral views were obtained. There is soft tissue swelling laterally. No radiopaque foreign body or soft tissue air. No fracture or dislocation. Joint spaces appear normal. No erosive change. IMPRESSION: Soft tissue swelling laterally. No fracture or dislocation. No evident arthropathy.  Electronically Signed   By: 08/01/2019 III M.D.   On: 07/30/2019 14:05    Procedures Procedures (including critical care time)  Medications Ordered in ED Medications  0.9 %  sodium chloride infusion ( Intravenous Stopped 07/30/19 1717)  vancomycin (VANCOREADY) IVPB 750 mg/150 mL (has no administration in time range)  vancomycin (VANCOCIN) IVPB 1000 mg/200 mL premix (0 mg Intravenous Stopped 07/30/19 1717)  morphine 4 MG/ML injection 4 mg (4 mg Intravenous Given 07/30/19 1736)    ED Course  I have reviewed the triage vital signs and the nursing notes.  Pertinent labs & imaging results that were available during my care of the patient were reviewed by me and considered in my medical decision making (see chart for details).    MDM Rules/Calculators/A&P                       Final Clinical Impression(s) / ED Diagnoses Final diagnoses:  None  Patient need admission to the hospital for further evaluation and care of the cellulitis of her foot.  There is concerned that this could be deeper infection and may need MRI at some point to further evaluate.  Spoke with the [facility] hospitalist who will accept the patient for admission at [facility].  Rx / DC Orders ED Discharge Orders    None       Dalia Heading, Hershal Coria 07/30/19 1857    Fredia Sorrow, MD 08/09/19 [phone]

## 2019-07-30 NOTE — Progress Notes (Signed)
rm 1315  Anna Colon 38yo from [facility] to rm 1315 with Cellulitis of right root.  Just arrived to unit. Needs orders.

## 2019-07-30 NOTE — H&P (Signed)
History and Physical   Anna Colon NAT:[id number] DOB: [birth year] DOA: 07/30/2019  Referring MD/NP/PA: Dr. Bobby Rumpf  PCP: Patient, No Pcp Per   Outpatient Specialists: None  Patient coming from: [place] from home  Chief Complaint: Right foot pain  HPI: Anna Colon is a 39 y.o. female with medical history significant of polysubstance abuse especially IV drug abuse with recurrent cellulitis, history of MRSA sepsis and bacteremia, asthma, hepatitis C, patient presented to [facility] with pain swelling and tenderness of the right foot which started about 3 days ago.  She denied injecting into her foot.  Patient has noted worsening pain she is unable to keep anything down.  Denied any significant fever but had some chills.  Denied any others area of swelling.  Patient was seen and evaluated and appears to have cellulitis of the right foot with tenderness and warmth.  She is being admitted to the hospital for treatment..  ED Course: Temperature 98.9 blood pressure 140/90 pulse 101, respiratory rate of 18 oxygen sat 99% room air.  CBC and chemistry appear all to be within normal.  COVID-19 screen is negative.  Blood cultures obtained and currently pending.  Patient admitted for treatment of cellulitis.  Review of Systems: As per HPI otherwise 10 point review of systems negative.    Past Medical History:  Diagnosis Date  . Asthma   . Hepatitis C   . IVDU (intravenous drug user)     Past Surgical History:  Procedure Laterality Date  . CESAREAN SECTION    . MULTIPLE EXTRACTIONS WITH ALVEOLOPLASTY N/A 07/25/2016   Procedure: MULTIPLE EXTRACTION OF TOOTH #'s 18,19 and 21 thru 31  WITH ALVEOLOPLASTY;  Surgeon: Lenn Cal, DDS;  Location: [facility];  Service: Oral Surgery;  Laterality: N/A;     reports that she has been smoking. She has a 24.00 pack-year smoking history. She has never used smokeless tobacco. She reports current drug use. Drugs: IV, Cocaine, and  Heroin. She reports that she does not drink alcohol.  No Known Allergies  No family history on file.   Prior to Admission medications   Medication Sig Start Date End Date Taking? Authorizing Provider  acetaminophen (TYLENOL) 325 MG tablet Take 2 tablets (650 mg total) by mouth every 6 (six) hours as needed for mild pain (or Fever >/= 101). 08/07/16   Rosita Fire, MD  ibuprofen (ADVIL,MOTRIN) 200 MG tablet Take 800 mg by mouth every 6 (six) hours as needed for headache or moderate pain.     [provider]  nicotine (NICODERM CQ - DOSED IN MG/24 HOURS) 21 mg/24hr patch Place 1 patch (21 mg total) onto the skin daily. Patient not taking: Reported on 08/29/2016 08/07/16   Rosita Fire, MD    Physical Exam: Vitals:   07/30/19 1343 07/30/19 1346 07/30/19 1718 07/30/19 1837  BP:  140/90 131/83 128/79  Pulse:  (!) 101 80 84  Resp:  16 16 14   Temp:  98.7 F (37.1 C) 98.9 F (37.2 C)   TempSrc:  Oral Oral   SpO2:  100% 100% 99%  Weight: 59 kg     Height: 5\' 2"  (1.575 m)         Constitutional: Acutely ill looking, tremulous and withdrawn Vitals:   07/30/19 1343 07/30/19 1346 07/30/19 1718 07/30/19 1837  BP:  140/90 131/83 128/79  Pulse:  (!) 101 80 84  Resp:  16 16 14   Temp:  98.7 F (37.1 C) 98.9 F (  37.2 C)   TempSrc:  Oral Oral   SpO2:  100% 100% 99%  Weight: 59 kg     Height: 5\' 2"  (1.575 m)      Eyes: PERRL, lids and conjunctivae normal ENMT: Mucous membranes are moist. Posterior pharynx clear of any exudate or lesions.Normal dentition.  Neck: normal, supple, no masses, no thyromegaly Respiratory: clear to auscultation bilaterally, no wheezing, no crackles. Normal respiratory effort. No accessory muscle use.  Cardiovascular: Sinus tachycardia, no murmurs / rubs / gallops. No extremity edema. 2+ pedal pulses. No carotid bruits.  Abdomen: no tenderness, no masses palpated. No hepatosplenomegaly. Bowel sounds positive.  Musculoskeletal: no clubbing  / cyanosis. No joint deformity upper and lower extremities. Good ROM, no contractures. Normal muscle tone.  Right foot swollen, tender, warm to touch or red Skin: no rashes, lesions, ulcers. No induration, red swollen right foot Neurologic: CN 2-12 grossly intact. Sensation intact, DTR normal. Strength 5/5 in all 4.  Psychiatric: Normal judgment and insight. Alert and oriented x 3. Normal mood.     Labs on Admission: I have personally reviewed following labs and imaging studies  CBC: Recent Labs  Lab 07/30/19 1538  WBC 9.0  NEUTROABS 6.8  HGB 12.2  HCT 38.8  MCV 86.2  PLT 207   Basic Metabolic Panel: Recent Labs  Lab 07/30/19 1538  NA 137  K 3.9  CL 105  CO2 25  GLUCOSE 93  BUN 11  CREATININE 0.74  CALCIUM 9.0   GFR: Estimated Creatinine Clearance: 73.9 mL/min (by C-G formula based on SCr of 0.74 mg/dL). Liver Function Tests: No results for input(s): AST, ALT, ALKPHOS, BILITOT, PROT, ALBUMIN in the last 168 hours. No results for input(s): LIPASE, AMYLASE in the last 168 hours. No results for input(s): AMMONIA in the last 168 hours. Coagulation Profile: No results for input(s): INR, PROTIME in the last 168 hours. Cardiac Enzymes: No results for input(s): CKTOTAL, CKMB, CKMBINDEX, TROPONINI in the last 168 hours. BNP (last 3 results) No results for input(s): PROBNP in the last 8760 hours. HbA1C: No results for input(s): HGBA1C in the last 72 hours. CBG: No results for input(s): GLUCAP in the last 168 hours. Lipid Profile: No results for input(s): CHOL, HDL, LDLCALC, TRIG, CHOLHDL, LDLDIRECT in the last 72 hours. Thyroid Function Tests: No results for input(s): TSH, T4TOTAL, FREET4, T3FREE, THYROIDAB in the last 72 hours. Anemia Panel: No results for input(s): VITAMINB12, FOLATE, FERRITIN, TIBC, IRON, RETICCTPCT in the last 72 hours. Urine analysis:    Component Value Date/Time   COLORURINE YELLOW 09/15/2018 2342   APPEARANCEUR CLEAR 09/15/2018 2342   LABSPEC  1.020 09/15/2018 2342   PHURINE 6.0 09/15/2018 2342   GLUCOSEU NEGATIVE 09/15/2018 2342   HGBUR SMALL (A) 09/15/2018 2342   BILIRUBINUR SMALL (A) 09/15/2018 2342   KETONESUR 15 (A) 09/15/2018 2342   PROTEINUR NEGATIVE 09/15/2018 2342   NITRITE NEGATIVE 09/15/2018 2342   LEUKOCYTESUR NEGATIVE 09/15/2018 2342   Sepsis Labs: @LABRCNTIP (procalcitonin:4,lacticidven:4) )No results found for this or any previous visit (from the past 240 hour(s)).   Radiological Exams on Admission: DG Foot Complete Right  Result Date: 07/30/2019 CLINICAL DATA:  Soft tissue swelling and redness EXAM: RIGHT FOOT COMPLETE - 3+ VIEW COMPARISON:  None. FINDINGS: Frontal, oblique, and lateral views were obtained. There is soft tissue swelling laterally. No radiopaque foreign body or soft tissue air. No fracture or dislocation. Joint spaces appear normal. No erosive change. IMPRESSION: Soft tissue swelling laterally. No fracture or dislocation. No evident arthropathy. Electronically  Signed   By: Bretta Bang III M.D.   On: 07/30/2019 14:05     Assessment/Plan Principal Problem:   Cellulitis of foot Active Problems:   IVDU (intravenous drug user)   Cigarette smoker   Hepatitis C antibody test positive   Polysubstance abuse (HCC)   Polysubstance (excluding opioids) dependence, daily use (HCC)     #1 cellulitis of the right leg: Patient has history of MRSA infection in the past.  This could well be MRSA.  We will treat her with IV vancomycin.  Await culture sensitivity results.  Elevate the foot.  Supportive care.  #2 IV drug abuse: Patient will need counseling.  Continue supportive care  #3 tobacco abuse: Nicotine patches needed.  #4 history of hepatitis C infection: Normal LFTs.  Outpatient treatment.   DVT prophylaxis: Lovenox Code Status: Full code Family Communication: No family at bedside Disposition Plan: Home Consults called: None Admission status: Inpatient  Severity of Illness: The  appropriate patient status for this patient is INPATIENT. Inpatient status is judged to be reasonable and necessary in order to provide the required intensity of service to ensure the patient's safety. The patient's presenting symptoms, physical exam findings, and initial radiographic and laboratory data in the context of their chronic comorbidities is felt to place them at high risk for further clinical deterioration. Furthermore, it is not anticipated that the patient will be medically stable for discharge from the hospital within 2 midnights of admission. The following factors support the patient status of inpatient.   " The patient's presenting symptoms include right foot swelling. " The worrisome physical exam findings include redness and swelling foot. " The initial radiographic and laboratory data are worrisome because of normal labs. " The chronic co-morbidities include IV drug abuse.   * I certify that at the point of admission it is my clinical judgment that the patient will require inpatient hospital care spanning beyond 2 midnights from the point of admission due to high intensity of service, high risk for further deterioration and high frequency of surveillance required.Lonia Blood MD [facility] Pager [phone]  If 7PM-7AM, please contact night-coverage [email or web address] Password [facility]  07/30/2019, 9:26 PM

## 2019-07-31 DIAGNOSIS — L03119 Cellulitis of unspecified part of limb: Secondary | ICD-10-CM

## 2019-07-31 LAB — COMPREHENSIVE METABOLIC PANEL
ALT: 80 U/L — ABNORMAL HIGH (ref 0–44)
AST: 87 U/L — ABNORMAL HIGH (ref 15–41)
Albumin: 3 g/dL — ABNORMAL LOW (ref 3.5–5.0)
Alkaline Phosphatase: 168 U/L — ABNORMAL HIGH (ref 38–126)
Anion gap: 10 (ref 5–15)
BUN: 10 mg/dL (ref 6–20)
CO2: 21 mmol/L — ABNORMAL LOW (ref 22–32)
Calcium: 8.7 mg/dL — ABNORMAL LOW (ref 8.9–10.3)
Chloride: 106 mmol/L (ref 98–111)
Creatinine, Ser: 0.62 mg/dL (ref 0.44–1.00)
GFR calc Af Amer: >60 mL/min (ref >60)
GFR calc non Af Amer: >60 mL/min (ref >60)
Glucose, Bld: 97 mg/dL (ref 70–99)
Potassium: 3.7 mmol/L (ref 3.5–5.1)
Sodium: 137 mmol/L (ref 135–145)
Total Bilirubin: 0.5 mg/dL (ref 0.3–1.2)
Total Protein: 6.7 g/dL (ref 6.5–8.1)

## 2019-07-31 LAB — CBC
HCT: 37.5 % (ref 36.0–46.0)
Hemoglobin: 11.5 g/dL — ABNORMAL LOW (ref 12.0–15.0)
MCH: 26.6 pg (ref 26.0–34.0)
MCHC: 30.7 g/dL (ref 30.0–36.0)
MCV: 86.6 fL (ref 80.0–100.0)
Platelets: 205 10*3/uL (ref 150–400)
RBC: 4.33 MIL/uL (ref 3.87–5.11)
RDW: 14.2 % (ref 11.5–15.5)
WBC: 8.2 10*3/uL (ref 4.0–10.5)
nRBC: 0 % (ref 0.0–0.2)

## 2019-07-31 LAB — SARS CORONAVIRUS 2 (TAT 6-24 HRS): SARS Coronavirus 2: NEGATIVE

## 2019-07-31 MED ORDER — PROMETHAZINE HCL 25 MG/ML IJ SOLN
12.5000 mg | Freq: Four times a day (QID) | INTRAMUSCULAR | Status: DC | PRN
  Administered 2019-07-31 – 2019-08-01 (×3): 12.5 mg via INTRAVENOUS
  Filled 2019-07-31 (×4): qty 1

## 2019-07-31 MED ORDER — METHADONE HCL 10 MG PO TABS
30.0000 mg | ORAL_TABLET | Freq: Every day | ORAL | Status: DC
  Administered 2019-07-31 – 2019-08-01 (×2): 30 mg via ORAL
  Filled 2019-07-31 (×2): qty 3

## 2019-07-31 MED ORDER — NICOTINE 21 MG/24HR TD PT24
21.0000 mg | MEDICATED_PATCH | TRANSDERMAL | Status: DC
  Administered 2019-07-31: 21 mg via TRANSDERMAL
  Filled 2019-07-31: qty 1

## 2019-07-31 MED ORDER — OXYCODONE-ACETAMINOPHEN 5-325 MG PO TABS
1.0000 | ORAL_TABLET | ORAL | Status: DC | PRN
  Administered 2019-07-31 – 2019-08-01 (×6): 1 via ORAL
  Filled 2019-07-31 (×6): qty 1

## 2019-07-31 NOTE — Progress Notes (Signed)
PROGRESS NOTE  Anna Colon  DOB: 21-Jan-1981  PCP: Patient, No Pcp Per EAV:[id number]  DOA: 07/30/2019 Admitted From: Home  LOS: 1 day   Chief Complaint  Patient presents with  . Foot Swelling   Brief narrative: Anna Colon is a 39 y.o. female with medical history significant of polysubstance abuse especially IV drug abuse with recurrent cellulitis, history of MRSA sepsis and bacteremia, asthma, hepatitis C. Patient presented to med [facility] with pain swelling and tenderness of the right foot which started about 3 days ago.   No fever, no similar areas of inflammation elsewhere.  Reports history of abscess at injection site in the past but this time, she denies injecting into her foot.    In the ED, patient was afebrile and hemodynamically stable.   Labs normal.  Blood culture sent.    Patient was admitted to the hospitalist service for treatment..  Subjective: Patient was seen and examined this morning.  Young Caucasian female, looks older for age.  She complains that she is in pain and afraid that she is going to withdrawal.  Assessment/Plan: Cellulitis of the right leg:  -Patient has history of MRSA infection in the past.  Started on IV vancomycin.  Continue the same.  If improvement seen in cellulitis by tomorrow, will plan to discharge on oral doxycycline.  IV drug abuse: Actively using IV heroin.  Also takes methadone 30 mg daily.  Same resumed.  tobacco abuse: Nicotine patches needed.  History of hepatitis C infection:  Related to IV drug abuse.  Normal LFTs.  Continue outpatient treatment.  Mobility: Increase ambulation Diet: Regular diet Fluid: Normal saline at 100/h DVT prophylaxis:  Lovenox subcu Code Status:  Full code Family Communication:  None at bedside Expected Discharge:  Hopefully home tomorrow on oral antibiotics.  Consultants:  None  Procedures:  None  Antimicrobials: Anti-infectives (From admission, onward)   Start      Dose/Rate Route Frequency Ordered Stop   07/31/19 0400  vancomycin (VANCOREADY) IVPB 750 mg/150 mL     750 mg 150 mL/hr over 60 Minutes Intravenous Every 12 hours 07/30/19 1613     07/30/19 1515  vancomycin (VANCOCIN) IVPB 1000 mg/200 mL premix     1,000 mg 200 mL/hr over 60 Minutes Intravenous  Once 07/30/19 1502 07/30/19 1717        Code Status: Full Code   Diet Order            Diet regular Room service appropriate? Yes; Fluid consistency: Thin  Diet effective now              Infusions:  . sodium chloride Stopped (07/30/19 1717)  . sodium chloride 100 mL/hr at 07/31/19 1138  . vancomycin 750 mg (07/31/19 0550)    Scheduled Meds: . enoxaparin (LOVENOX) injection  40 mg Subcutaneous Q24H  . ketorolac  30 mg Intravenous Q6H  . methadone  30 mg Oral Daily    PRN meds: sodium chloride, acetaminophen **OR** acetaminophen, ondansetron **OR** ondansetron (ZOFRAN) IV, oxyCODONE-acetaminophen   Objective: Vitals:   07/31/19 0537 07/31/19 1015  BP: (!) 147/94 132/80  Pulse: 90 84  Resp: 16 18  Temp: 98.6 F (37 C) 98.6 F (37 C)  SpO2: 100% 100%    Intake/Output Summary (Last 24 hours) at 07/31/2019 1331 Last data filed at 07/31/2019 1000 Gross per 24 hour  Intake 690.23 ml  Output 300 ml  Net 390.23 ml   Filed Weights   07/30/19 1343  Weight: 59 kg  Weight change:  Body mass index is 23.78 kg/m.   Physical Exam: General exam: Appears calm and comfortable.  Skin: No rashes, lesions or ulcers. HEENT: Atraumatic, normocephalic, supple neck, no obvious bleeding Lungs: Clear to auscultation bilaterally. CVS: Regular rate and rhythm, no murmur GI/Abd soft, nontender, nondistended, bowel sound present CNS: Alert, awake, oriented x3 Psychiatry: Mood appropriate Extremities: No pedal edema, no calf tenderness.  Right foot cellulitis of the dorsum of lateral front foot.  Data Review: I have personally reviewed the laboratory data and studies  available.  Recent Labs  Lab 07/30/19 1538 07/30/19 2156 07/31/19 0403  WBC 9.0 8.6 8.2  NEUTROABS 6.8  --   --   HGB 12.2 12.3 11.5*  HCT 38.8 39.6 37.5  MCV 86.2 86.1 86.6  PLT 207 194 205   Recent Labs  Lab 07/30/19 1538 07/30/19 2156 07/31/19 0403  NA 137  --  137  K 3.9  --  3.7  CL 105  --  106  CO2 25  --  21*  GLUCOSE 93  --  97  BUN 11  --  10  CREATININE 0.74 0.58 0.62  CALCIUM 9.0  --  8.7*    Anna Croak, MD  [facility] 07/31/2019

## 2019-08-01 LAB — MRSA PCR SCREENING: MRSA by PCR: POSITIVE — AB

## 2019-08-01 MED ORDER — DOXYCYCLINE HYCLATE 100 MG PO TABS: 100.0000 mg | ORAL_TABLET | Freq: Two times a day (BID) | ORAL | 0 refills | Status: AC

## 2019-08-01 MED ORDER — MUPIROCIN 2 % EX OINT
TOPICAL_OINTMENT | Freq: Two times a day (BID) | CUTANEOUS | Status: DC
  Filled 2019-08-01: qty 22

## 2019-08-01 NOTE — Discharge Instructions (Signed)
Stop IV heroin use.

## 2019-08-01 NOTE — TOC Transition Note (Signed)
Transition of Care (TOC) - CM/SW Discharge Note   Patient Details  Name: Anna Colon MRN: [id number] Date of Birth: 03/24/1981  Transition of Care (TOC) CM/SW Contact:   W , LCSWA Phone Number: 08/01/2019, 8:44 AM   Clinical Narrative:     CSW met with pt at bedside and introduce role.  CSW spoke with pt about SA resources.  Pt is agreeable to receive outpatient services for substance abuse.  Pt stated "my father would like for me to go to a facility on [address] in [place]".  Patient did not remember the name of facility but will ask father and will review resources for name of facility.         Patient Goals and CMS Choice        Discharge Placement                       Discharge Plan and Services                                     Social Determinants of Health (SDOH) Interventions     Readmission Risk Interventions No flowsheet data found.     

## 2019-08-01 NOTE — Plan of Care (Signed)
  Problem: Clinical Measurements: Goal: Ability to avoid or minimize complications of infection will improve Outcome: Adequate for Discharge   Problem: Skin Integrity: Goal: Skin integrity will improve Outcome: Adequate for Discharge   Problem: Education: Goal: Knowledge of General Education information will improve Description: Including pain rating scale, medication(s)/side effects and non-pharmacologic comfort measures Outcome: Adequate for Discharge   Problem: Health Behavior/Discharge Planning: Goal: Ability to manage health-related needs will improve Outcome: Adequate for Discharge   Problem: Clinical Measurements: Goal: Ability to maintain clinical measurements within normal limits will improve Outcome: Adequate for Discharge Goal: Will remain free from infection Outcome: Adequate for Discharge Goal: Diagnostic test results will improve Outcome: Adequate for Discharge Goal: Respiratory complications will improve Outcome: Adequate for Discharge Goal: Cardiovascular complication will be avoided Outcome: Adequate for Discharge   Problem: Activity: Goal: Risk for activity intolerance will decrease Outcome: Adequate for Discharge   Problem: Nutrition: Goal: Adequate nutrition will be maintained Outcome: Adequate for Discharge   Problem: Coping: Goal: Level of anxiety will decrease Outcome: Adequate for Discharge   Problem: Elimination: Goal: Will not experience complications related to bowel motility Outcome: Adequate for Discharge Goal: Will not experience complications related to urinary retention Outcome: Adequate for Discharge   Problem: Pain Managment: Goal: General experience of comfort will improve Outcome: Adequate for Discharge   Problem: Safety: Goal: Ability to remain free from injury will improve Outcome: Adequate for Discharge   Problem: Skin Integrity: Goal: Risk for impaired skin integrity will decrease Outcome: Adequate for Discharge   

## 2019-08-01 NOTE — Discharge Summary (Signed)
Physician Discharge Summary  [name] CZY:[id number] DOB: 1981-01-03 DOA: 07/30/2019  PCP: Patient, No Pcp Per  Admit date: 07/30/2019 Discharge date: 08/01/2019  Admitted From: Home Discharge disposition: Home   Code Status: Full Code  Diet Recommendation: Regular diet   Recommendations for Outpatient Follow-Up:   1. Follow-up with [facility] 2. Stop IV drug abuse  Discharge Diagnosis:   Principal Problem:   Cellulitis of foot Active Problems:   IVDU (intravenous drug user)   Cigarette smoker   Hepatitis C antibody test positive   Polysubstance abuse (HCC)   Polysubstance (excluding opioids) dependence, daily use (HCC)    History of Present Illness / Brief narrative:  Anna Roachis a 39 y.o.femalewith medical history significant ofpolysubstance abuse especially IV drug abuse with recurrent cellulitis, history of MRSA sepsis and bacteremia, asthma, hepatitis C. Patient presented to [facility] with pain swelling and tenderness of the right foot which started about 3 days ago.  No fever, no similar areas of inflammation elsewhere.  Reports history of abscess at injection site in the past but this time, she denies injecting into her foot.    In the ED, patient was afebrile and hemodynamically stable.   Labs normal.  Blood culture sent.    Patient was admitted to the hospitalist service for treatment.Marland Kitchen  Hospital Course:  Cellulitis of the right leg: -Patient has history of MRSA infection in the past.   -Presented with cellulitis of the right leg.  No evidence of abscess and ultrasound.   -Patient was started on IV vancomycin.  Blood culture obtained on admission did not show any growth.   -Cellulitis has significantly improved.  We will plan to discharge her on oral doxycycline for next 10 days.  -Patient denies injecting to the site of cellulitis.  I am not sure of the reliability.  I suggested her to avoid IV heroin use  anyway.  IV drug abuse: Actively using IV heroin.  Also takes methadone 30 mg daily. Continue the same.  Counseled to avoid IV heroin use.  tobacco abuse:Nicotine patches needed.  History of hepatitis C infection: Related to IV drug abuse.  Normal LFTs.  Continue outpatient treatment.  Stable for discharge to home today.  Subjective:  Seen and examined this morning.  Feels much better.  Slight improvement.  Wants to go home. Discharge Exam:   Vitals:   07/31/19 1015 07/31/19 1345 07/31/19 2150 08/01/19 0539  BP: 132/80 108/83 124/86 (!) 136/94  Pulse: 84 71 79 64  Resp: 18 18 16 16   Temp: 98.6 F (37 C) 98.1 F (36.7 C) 98.6 F (37 C) 97.9 F (36.6 C)  TempSrc: Oral Oral  Oral  SpO2: 100% 100% 99% 99%  Weight:      Height:        Body mass index is 23.78 kg/m.  General exam: Appears calm and comfortable.  Skin: No rashes, lesions or ulcers. HEENT: Atraumatic, normocephalic, supple neck, no obvious bleeding Lungs: Clear to auscultation bilaterally CVS: Regular rate and rhythm, no murmur GI/Abd soft, nontender nondistended, bowel sound present CNS: Alert, awake monitor x3 Psychiatry: Mood appropriate Extremities: No pedal edema, no calf tenderness, cellulitis in the right foot is much better.  Discharge Instructions:  Wound care: None Discharge Instructions    Call MD for:  redness, tenderness, or signs of infection (pain, swelling, redness, odor or green/yellow discharge around incision site)   Complete by: As directed    Call MD for:  severe uncontrolled  pain   Complete by: As directed    Call MD for:  temperature >100.4   Complete by: As directed    Increase activity slowly   Complete by: As directed      Follow-up Information    [facility] Follow up.   Contact information: [address] [place] [address] [phone]         Allergies as of 08/01/2019   No Known Allergies     Medication  List    STOP taking these medications   nicotine 21 mg/24hr patch Commonly known as: NICODERM CQ - dosed in mg/24 hours     TAKE these medications   acetaminophen 325 MG tablet Commonly known as: TYLENOL Take 2 tablets (650 mg total) by mouth every 6 (six) hours as needed for mild pain (or Fever >/= 101).   doxycycline 100 MG tablet Commonly known as: VIBRA-TABS Take 1 tablet (100 mg total) by mouth 2 (two) times daily for 10 days.   ibuprofen 200 MG tablet Commonly known as: ADVIL Take 800 mg by mouth every 6 (six) hours as needed for headache or moderate pain.   methadone 10 MG/ML solution Commonly known as: DOLOPHINE Take 30-70 mg by mouth See admin instructions. Last dose 07/28/19 30 mg @@10 :48 Per Rhea last order increase 10 mg per day until reach 70 mg due to the patient being absent       Time coordinating discharge: 35 minutes  The results of significant diagnostics from this hospitalization (including imaging, microbiology, ancillary and laboratory) are listed below for reference.    Procedures and Diagnostic Studies:   DG Foot Complete Right  Result Date: 07/30/2019 CLINICAL DATA:  Soft tissue swelling and redness EXAM: RIGHT FOOT COMPLETE - 3+ VIEW COMPARISON:  None. FINDINGS: Frontal, oblique, and lateral views were obtained. There is soft tissue swelling laterally. No radiopaque foreign body or soft tissue air. No fracture or dislocation. Joint spaces appear normal. No erosive change. IMPRESSION: Soft tissue swelling laterally. No fracture or dislocation. No evident arthropathy. Electronically Signed   By: Bretta Bang III M.D.   On: 07/30/2019 14:05     Labs:   Basic Metabolic Panel: Recent Labs  Lab 07/30/19 1538 07/30/19 2156 07/31/19 0403  NA 137  --  137  K 3.9  --  3.7  CL 105  --  106  CO2 25  --  21*  GLUCOSE 93  --  97  BUN 11  --  10  CREATININE 0.74 0.58 0.62  CALCIUM 9.0  --  8.7*   GFR Estimated Creatinine Clearance: 73.9 mL/min  (by C-G formula based on SCr of 0.62 mg/dL). Liver Function Tests: Recent Labs  Lab 07/31/19 0403  AST 87*  ALT 80*  ALKPHOS 168*  BILITOT 0.5  PROT 6.7  ALBUMIN 3.0*   No results for input(s): LIPASE, AMYLASE in the last 168 hours. No results for input(s): AMMONIA in the last 168 hours. Coagulation profile No results for input(s): INR, PROTIME in the last 168 hours.  CBC: Recent Labs  Lab 07/30/19 1538 07/30/19 2156 07/31/19 0403  WBC 9.0 8.6 8.2  NEUTROABS 6.8  --   --   HGB 12.2 12.3 11.5*  HCT 38.8 39.6 37.5  MCV 86.2 86.1 86.6  PLT 207 194 205   Cardiac Enzymes: No results for input(s): CKTOTAL, CKMB, CKMBINDEX, TROPONINI in the last 168 hours. BNP: Invalid input(s): POCBNP CBG: No results for input(s): GLUCAP in the  last 168 hours. D-Dimer No results for input(s): DDIMER in the last 72 hours. Hgb A1c No results for input(s): HGBA1C in the last 72 hours. Lipid Profile No results for input(s): CHOL, HDL, LDLCALC, TRIG, CHOLHDL, LDLDIRECT in the last 72 hours. Thyroid function studies No results for input(s): TSH, T4TOTAL, T3FREE, THYROIDAB in the last 72 hours.  Invalid input(s): FREET3 Anemia work up No results for input(s): VITAMINB12, FOLATE, FERRITIN, TIBC, IRON, RETICCTPCT in the last 72 hours. Microbiology Recent Results (from the past 240 hour(s))  Culture, blood (routine x 2)     Status: None (Preliminary result)   Collection Time: 07/30/19  5:10 PM   Specimen: BLOOD  Result Value Ref Range Status   Specimen Description   Final    BLOOD BLOOD LEFT ARM UPPER Performed at [facility], [address] [place], [place] [address]    Special Requests   Final    BOTTLES DRAWN AEROBIC AND ANAEROBIC Blood Culture adequate volume Performed at [facility], [address] [place], [place] [address]    Culture   Final    NO GROWTH 2 DAYS Performed at [facility], [address], [place], [place] [address]    Report  Status PENDING  Incomplete  Culture, blood (routine x 2)     Status: None (Preliminary result)   Collection Time: 07/30/19  5:20 PM   Specimen: BLOOD  Result Value Ref Range Status   Specimen Description   Final    BLOOD RIGHT ANTECUBITAL Performed at [facility], [address] [place], [place] [address]    Special Requests   Final    BOTTLES DRAWN AEROBIC ONLY Blood Culture results may not be optimal due to an inadequate volume of blood received in culture bottles Performed at [facility], [address] [place], [place] [address]    Culture   Final    NO GROWTH 2 DAYS Performed at [facility], [address], [place], [place] [address]    Report Status PENDING  Incomplete  SARS CORONAVIRUS 2 (TAT 6-24 HRS) Nasopharyngeal Nasopharyngeal Swab     Status: None   Collection Time: 07/30/19  5:40 PM   Specimen: Nasopharyngeal Swab  Result Value Ref Range Status   SARS Coronavirus 2 NEGATIVE NEGATIVE Final    Comment: (NOTE) SARS-CoV-2 target nucleic acids are NOT DETECTED. The SARS-CoV-2 RNA is generally detectable in upper and lower respiratory specimens during the acute phase of infection. Negative results do not preclude SARS-CoV-2 infection, do not rule out co-infections with other pathogens, and should not be used as the sole basis for treatment or other patient management decisions. Negative results must be combined with clinical observations, patient history, and epidemiological information. The expected result is Negative. Fact Sheet for Patients: HairSlick.no Fact Sheet for Healthcare Providers: quierodirigir.com This test is not yet approved or cleared by the [place] FDA and  has been authorized for detection and/or diagnosis of SARS-CoV-2 by FDA under an Emergency Use Authorization (EUA). This EUA will remain  in effect (meaning this test can be used) for the duration of  the COVID-19 declaration under Section 56 4(b)(1) of the Act, 21 U.S.C. section 360bbb-3(b)(1), unless the authorization is terminated or revoked sooner. Performed at [facility], [address], [place], [place] [address]   MRSA PCR Screening     Status: Abnormal   Collection Time: 07/31/19  9:34 PM  Specimen: Nasopharyngeal  Result Value Ref Range Status   MRSA by PCR POSITIVE (A) NEGATIVE Final    Comment:        The GeneXpert MRSA Assay (FDA approved for NASAL specimens only), is one component of a comprehensive MRSA colonization surveillance program. It is not intended to diagnose MRSA infection nor to guide or monitor treatment for MRSA infections. RESULT CALLED TO, READ BACK BY AND VERIFIED WITH: ANTHONY,S @ 0154 ON [id number] BY POTEAT,S Performed at [facility], [address], [place], [place] [address]     Please note: You were cared for by a hospitalist during your hospital stay. Once you are discharged, your primary care physician will handle any further medical issues. Please note that NO REFILLS for any discharge medications will be authorized once you are discharged, as it is imperative that you return to your primary care physician (or establish a relationship with a primary care physician if you do not have one) for your post hospital discharge needs so that they can reassess your need for medications and monitor your lab values.  Signed: Melina Schools Rayden Colon  [facility] 08/01/2019, 11:34 AM

## 2019-08-04 LAB — CULTURE, BLOOD (ROUTINE X 2)
Culture: NO GROWTH
Culture: NO GROWTH
Special Requests: ADEQUATE
